# Patient Record
Sex: Female | Born: 1945
Health system: Southern US, Community
[De-identification: ages and names within clinical notes are randomized; demographics above are authoritative.]

## PROBLEM LIST (undated history)

## (undated) DIAGNOSIS — K30 Functional dyspepsia: Secondary | ICD-10-CM

## (undated) DIAGNOSIS — F329 Major depressive disorder, single episode, unspecified: Secondary | ICD-10-CM

## (undated) DIAGNOSIS — R0989 Other specified symptoms and signs involving the circulatory and respiratory systems: Secondary | ICD-10-CM

## (undated) DIAGNOSIS — M810 Age-related osteoporosis without current pathological fracture: Secondary | ICD-10-CM

## (undated) DIAGNOSIS — R6889 Other general symptoms and signs: Secondary | ICD-10-CM

## (undated) DIAGNOSIS — T4145XA Adverse effect of unspecified anesthetic, initial encounter: Secondary | ICD-10-CM

## (undated) DIAGNOSIS — E559 Vitamin D deficiency, unspecified: Secondary | ICD-10-CM

## (undated) DIAGNOSIS — F32A Depression, unspecified: Secondary | ICD-10-CM

## (undated) DIAGNOSIS — N2889 Other specified disorders of kidney and ureter: Secondary | ICD-10-CM

## (undated) DIAGNOSIS — M1990 Unspecified osteoarthritis, unspecified site: Secondary | ICD-10-CM

## (undated) DIAGNOSIS — R32 Unspecified urinary incontinence: Secondary | ICD-10-CM

## (undated) DIAGNOSIS — I10 Essential (primary) hypertension: Secondary | ICD-10-CM

## (undated) DIAGNOSIS — K635 Polyp of colon: Secondary | ICD-10-CM

## (undated) DIAGNOSIS — G629 Polyneuropathy, unspecified: Secondary | ICD-10-CM

## (undated) DIAGNOSIS — F419 Anxiety disorder, unspecified: Secondary | ICD-10-CM

## (undated) DIAGNOSIS — K5790 Diverticulosis of intestine, part unspecified, without perforation or abscess without bleeding: Secondary | ICD-10-CM

## (undated) DIAGNOSIS — K449 Diaphragmatic hernia without obstruction or gangrene: Secondary | ICD-10-CM

## (undated) DIAGNOSIS — T8859XA Other complications of anesthesia, initial encounter: Secondary | ICD-10-CM

## (undated) DIAGNOSIS — I700 Atherosclerosis of aorta: Secondary | ICD-10-CM

## (undated) HISTORY — DX: Vitamin D deficiency, unspecified: E55.9

## (undated) HISTORY — DX: Other general symptoms and signs: R68.89

## (undated) HISTORY — DX: Major depressive disorder, single episode, unspecified: F32.9

## (undated) HISTORY — DX: Depression, unspecified: F32.A

## (undated) HISTORY — DX: Unspecified osteoarthritis, unspecified site: M19.90

## (undated) HISTORY — DX: Unspecified urinary incontinence: R32

## (undated) HISTORY — PX: COLONOSCOPY: SHX174

## (undated) HISTORY — DX: Age-related osteoporosis without current pathological fracture: M81.0

## (undated) HISTORY — DX: Atherosclerosis of aorta: I70.0

## (undated) HISTORY — PX: FACIAL COSMETIC SURGERY: SHX629

## (undated) HISTORY — DX: Functional dyspepsia: K30

## (undated) HISTORY — DX: Diverticulosis of intestine, part unspecified, without perforation or abscess without bleeding: K57.90

## (undated) HISTORY — DX: Polyp of colon: K63.5

## (undated) HISTORY — PX: FINGER TENDON REPAIR: SHX1640

## (undated) HISTORY — DX: Polyneuropathy, unspecified: G62.9

## (undated) HISTORY — DX: Other specified disorders of kidney and ureter: N28.89

## (undated) HISTORY — DX: Diaphragmatic hernia without obstruction or gangrene: K44.9

## (undated) HISTORY — DX: Other specified symptoms and signs involving the circulatory and respiratory systems: R09.89

## (undated) HISTORY — DX: Other complications of anesthesia, initial encounter: T88.59XA

## (undated) HISTORY — DX: Anxiety disorder, unspecified: F41.9

---
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
## 2011-08-22 ENCOUNTER — Other Ambulatory Visit: Payer: Self-pay | Admitting: Family Medicine

## 2011-08-22 DIAGNOSIS — M542 Cervicalgia: Secondary | ICD-10-CM

## 2011-08-23 ENCOUNTER — Other Ambulatory Visit: Payer: Self-pay

## 2011-08-26 ENCOUNTER — Other Ambulatory Visit: Payer: Self-pay

## 2011-08-28 ENCOUNTER — Ambulatory Visit
Admission: RE | Admit: 2011-08-28 | Discharge: 2011-08-28 | Disposition: A | Discharge status: Home / Self Care | Payer: Medicare Other | Source: Ambulatory Visit | Attending: Family Medicine | Admitting: Family Medicine

## 2011-08-28 DIAGNOSIS — M542 Cervicalgia: Secondary | ICD-10-CM

## 2011-08-29 ENCOUNTER — Other Ambulatory Visit: Payer: Self-pay

## 2011-09-01 ENCOUNTER — Other Ambulatory Visit: Payer: Self-pay

## 2011-10-08 ENCOUNTER — Ambulatory Visit: Payer: Medicare Other | Attending: Neurosurgery | Admitting: Physical Therapy

## 2011-10-08 DIAGNOSIS — R5381 Other malaise: Secondary | ICD-10-CM | POA: Insufficient documentation

## 2011-10-08 DIAGNOSIS — M2560 Stiffness of unspecified joint, not elsewhere classified: Secondary | ICD-10-CM | POA: Insufficient documentation

## 2011-10-08 DIAGNOSIS — IMO0001 Reserved for inherently not codable concepts without codable children: Secondary | ICD-10-CM | POA: Insufficient documentation

## 2011-10-08 DIAGNOSIS — R293 Abnormal posture: Secondary | ICD-10-CM | POA: Insufficient documentation

## 2011-10-08 DIAGNOSIS — M542 Cervicalgia: Secondary | ICD-10-CM | POA: Insufficient documentation

## 2011-10-10 ENCOUNTER — Ambulatory Visit: Payer: Medicare Other | Admitting: Physical Therapy

## 2011-10-16 ENCOUNTER — Encounter: Payer: Medicare Other | Admitting: Physical Therapy

## 2011-10-30 ENCOUNTER — Other Ambulatory Visit: Payer: Self-pay | Admitting: Family Medicine

## 2011-10-30 DIAGNOSIS — Z1231 Encounter for screening mammogram for malignant neoplasm of breast: Secondary | ICD-10-CM

## 2011-11-08 ENCOUNTER — Ambulatory Visit
Admission: RE | Admit: 2011-11-08 | Discharge: 2011-11-08 | Disposition: A | Discharge status: Home / Self Care | Payer: Medicare Other | Source: Ambulatory Visit | Attending: Family Medicine | Admitting: Family Medicine

## 2011-11-08 DIAGNOSIS — Z1231 Encounter for screening mammogram for malignant neoplasm of breast: Secondary | ICD-10-CM

## 2011-11-12 ENCOUNTER — Ambulatory Visit: Payer: Medicare Other

## 2011-11-13 ENCOUNTER — Ambulatory Visit: Payer: Medicare Other

## 2011-12-21 ENCOUNTER — Encounter (HOSPITAL_BASED_OUTPATIENT_CLINIC_OR_DEPARTMENT_OTHER): Payer: Self-pay | Admitting: *Deleted

## 2011-12-21 NOTE — Progress Notes (Signed)
To come in for ekg and bmet 

## 2011-12-24 ENCOUNTER — Encounter (HOSPITAL_BASED_OUTPATIENT_CLINIC_OR_DEPARTMENT_OTHER)
Admission: RE | Admit: 2011-12-24 | Discharge: 2011-12-24 | Disposition: A | Discharge status: Home / Self Care | Payer: Medicare Other | Source: Ambulatory Visit | Attending: Orthopedic Surgery | Admitting: Orthopedic Surgery

## 2011-12-24 LAB — BASIC METABOLIC PANEL
BUN: 17 mg/dL (ref 6–23)
Calcium: 9.9 mg/dL (ref 8.4–10.5)
Chloride: 98 mEq/L (ref 96–112)
Creatinine, Ser: 0.72 mg/dL (ref 0.50–1.10)
GFR calc Af Amer: >90 mL/min (ref >90)
GFR calc non Af Amer: 88 mL/min — ABNORMAL LOW (ref >90)

## 2011-12-27 ENCOUNTER — Encounter (HOSPITAL_BASED_OUTPATIENT_CLINIC_OR_DEPARTMENT_OTHER): Payer: Self-pay | Admitting: Anesthesiology

## 2011-12-27 ENCOUNTER — Encounter (HOSPITAL_BASED_OUTPATIENT_CLINIC_OR_DEPARTMENT_OTHER): Payer: Self-pay | Admitting: *Deleted

## 2011-12-27 ENCOUNTER — Ambulatory Visit (HOSPITAL_BASED_OUTPATIENT_CLINIC_OR_DEPARTMENT_OTHER): Payer: Medicare Other | Admitting: Anesthesiology

## 2011-12-27 ENCOUNTER — Encounter (HOSPITAL_BASED_OUTPATIENT_CLINIC_OR_DEPARTMENT_OTHER)
Admission: RE | Disposition: A | Discharge status: Home / Self Care | Payer: Self-pay | Source: Ambulatory Visit | Attending: Orthopedic Surgery

## 2011-12-27 ENCOUNTER — Ambulatory Visit (HOSPITAL_BASED_OUTPATIENT_CLINIC_OR_DEPARTMENT_OTHER)
Admission: RE | Admit: 2011-12-27 | Discharge: 2011-12-28 | Disposition: A | Discharge status: Home / Self Care | Payer: Medicare Other | Source: Ambulatory Visit | Attending: Orthopedic Surgery | Admitting: Orthopedic Surgery

## 2011-12-27 DIAGNOSIS — M2022 Hallux rigidus, left foot: Secondary | ICD-10-CM

## 2011-12-27 DIAGNOSIS — M2020 Hallux rigidus, unspecified foot: Secondary | ICD-10-CM | POA: Insufficient documentation

## 2011-12-27 DIAGNOSIS — M129 Arthropathy, unspecified: Secondary | ICD-10-CM | POA: Insufficient documentation

## 2011-12-27 DIAGNOSIS — M205X9 Other deformities of toe(s) (acquired), unspecified foot: Secondary | ICD-10-CM | POA: Insufficient documentation

## 2011-12-27 DIAGNOSIS — I10 Essential (primary) hypertension: Secondary | ICD-10-CM | POA: Insufficient documentation

## 2011-12-27 DIAGNOSIS — Z01812 Encounter for preprocedural laboratory examination: Secondary | ICD-10-CM | POA: Insufficient documentation

## 2011-12-27 DIAGNOSIS — M2040 Other hammer toe(s) (acquired), unspecified foot: Secondary | ICD-10-CM | POA: Insufficient documentation

## 2011-12-27 HISTORY — DX: Unspecified osteoarthritis, unspecified site: M19.90

## 2011-12-27 HISTORY — PX: HAMMER TOE SURGERY: SHX385

## 2011-12-27 HISTORY — DX: Essential (primary) hypertension: I10

## 2011-12-27 SURGERY — ARTHRODESIS FOOT WITH WEIL OSTEOTOMY
Anesthesia: General | Site: Toe | Laterality: Left | Wound class: Clean

## 2011-12-27 MED ORDER — METOCLOPRAMIDE HCL 5 MG PO TABS: 5.0000 mg | ORAL_TABLET | Freq: Three times a day (TID) | ORAL | Status: DC | PRN

## 2011-12-27 MED ORDER — MIDAZOLAM HCL 2 MG/2ML IJ SOLN
0.5000 mg | INTRAMUSCULAR | Status: DC | PRN
  Administered 2011-12-27: 2 mg via INTRAVENOUS

## 2011-12-27 MED ORDER — ONDANSETRON HCL 4 MG/2ML IJ SOLN
INTRAMUSCULAR | Status: DC | PRN
  Administered 2011-12-27: 4 mg via INTRAVENOUS

## 2011-12-27 MED ORDER — LIDOCAINE HCL 1 % IJ SOLN
INTRAMUSCULAR | Status: DC | PRN
  Administered 2011-12-27: 2 mL via INTRADERMAL

## 2011-12-27 MED ORDER — CHLORHEXIDINE GLUCONATE 4 % EX LIQD: 60.0000 mL | Freq: Once | CUTANEOUS | Status: DC

## 2011-12-27 MED ORDER — DEXTROSE 5 % IV SOLN: 500.0000 mg | Freq: Four times a day (QID) | INTRAVENOUS | Status: DC | PRN

## 2011-12-27 MED ORDER — ONDANSETRON HCL 4 MG PO TABS
4.0000 mg | ORAL_TABLET | Freq: Four times a day (QID) | ORAL | Status: DC | PRN
  Administered 2011-12-28: 4 mg via ORAL

## 2011-12-27 MED ORDER — TERBINAFINE HCL 250 MG PO TABS: 250.0000 mg | ORAL_TABLET | Freq: Every day | ORAL | Status: DC

## 2011-12-27 MED ORDER — LIDOCAINE HCL (CARDIAC) 20 MG/ML IV SOLN
INTRAVENOUS | Status: DC | PRN
  Administered 2011-12-27: 40 mg via INTRAVENOUS

## 2011-12-27 MED ORDER — ROPIVACAINE HCL 5 MG/ML IJ SOLN
INTRAMUSCULAR | Status: DC | PRN
  Administered 2011-12-27: 20 mL via EPIDURAL

## 2011-12-27 MED ORDER — METOCLOPRAMIDE HCL 5 MG/ML IJ SOLN
INTRAMUSCULAR | Status: DC | PRN
  Administered 2011-12-27: 10 mg via INTRAVENOUS

## 2011-12-27 MED ORDER — DOCUSATE SODIUM 100 MG PO CAPS
100.0000 mg | ORAL_CAPSULE | Freq: Two times a day (BID) | ORAL | Status: DC
  Administered 2011-12-27 – 2011-12-28 (×2): 100 mg via ORAL

## 2011-12-27 MED ORDER — HYDROMORPHONE HCL 2 MG PO TABS
2.0000 mg | ORAL_TABLET | ORAL | Status: DC | PRN
  Administered 2011-12-27 – 2011-12-28 (×3): 4 mg via ORAL

## 2011-12-27 MED ORDER — SENNA 8.6 MG PO TABS
1.0000 | ORAL_TABLET | Freq: Two times a day (BID) | ORAL | Status: DC
  Administered 2011-12-27 – 2011-12-28 (×2): 8.6 mg via ORAL

## 2011-12-27 MED ORDER — MORPHINE SULFATE 2 MG/ML IJ SOLN
1.0000 mg | INTRAMUSCULAR | Status: DC | PRN
  Administered 2011-12-27: 1 mg via INTRAVENOUS

## 2011-12-27 MED ORDER — METOCLOPRAMIDE HCL 5 MG/ML IJ SOLN: 10.0000 mg | Freq: Once | INTRAMUSCULAR | Status: AC | PRN

## 2011-12-27 MED ORDER — LOSARTAN POTASSIUM 50 MG PO TABS: 50.0000 mg | ORAL_TABLET | Freq: Every day | ORAL | Status: DC

## 2011-12-27 MED ORDER — ASPIRIN 81 MG PO TABS: 81.0000 mg | ORAL_TABLET | Freq: Every day | ORAL | Status: DC

## 2011-12-27 MED ORDER — METOCLOPRAMIDE HCL 5 MG/ML IJ SOLN
5.0000 mg | Freq: Three times a day (TID) | INTRAMUSCULAR | Status: DC | PRN
  Administered 2011-12-27: 10 mg via INTRAVENOUS

## 2011-12-27 MED ORDER — OXYCODONE HCL 5 MG PO TABS: 5.0000 mg | ORAL_TABLET | Freq: Once | ORAL | Status: AC | PRN

## 2011-12-27 MED ORDER — SODIUM CHLORIDE 0.9 % IV SOLN
INTRAVENOUS | Status: DC
  Administered 2011-12-27: 12:00:00 via INTRAVENOUS

## 2011-12-27 MED ORDER — OXYCODONE HCL 5 MG PO TABS
5.0000 mg | ORAL_TABLET | ORAL | Status: DC | PRN
  Administered 2011-12-27: 10 mg via ORAL

## 2011-12-27 MED ORDER — DEXAMETHASONE SODIUM PHOSPHATE 10 MG/ML IJ SOLN
INTRAMUSCULAR | Status: DC | PRN
  Administered 2011-12-27: 4 mg via INTRAVENOUS

## 2011-12-27 MED ORDER — SODIUM CHLORIDE 0.9 % IV SOLN: INTRAVENOUS | Status: DC

## 2011-12-27 MED ORDER — FENTANYL CITRATE 0.05 MG/ML IJ SOLN: 25.0000 ug | INTRAMUSCULAR | Status: DC | PRN

## 2011-12-27 MED ORDER — FENTANYL CITRATE 0.05 MG/ML IJ SOLN
INTRAMUSCULAR | Status: DC | PRN
  Administered 2011-12-27 (×3): 25 ug via INTRAVENOUS

## 2011-12-27 MED ORDER — DIPHENHYDRAMINE HCL 12.5 MG/5ML PO ELIX: 12.5000 mg | ORAL_SOLUTION | ORAL | Status: DC | PRN

## 2011-12-27 MED ORDER — LACTATED RINGERS IV SOLN
INTRAVENOUS | Status: DC
  Administered 2011-12-27 (×3): via INTRAVENOUS

## 2011-12-27 MED ORDER — LIDOCAINE-EPINEPHRINE 1.5-1:200000 % IJ SOLN
INTRAMUSCULAR | Status: DC | PRN
  Administered 2011-12-27: 20 mL via INTRADERMAL

## 2011-12-27 MED ORDER — ONDANSETRON HCL 4 MG/2ML IJ SOLN: 4.0000 mg | Freq: Four times a day (QID) | INTRAMUSCULAR | Status: DC | PRN

## 2011-12-27 MED ORDER — FENTANYL CITRATE 0.05 MG/ML IJ SOLN
50.0000 ug | INTRAMUSCULAR | Status: DC | PRN
  Administered 2011-12-27: 100 ug via INTRAVENOUS

## 2011-12-27 MED ORDER — EPHEDRINE SULFATE 50 MG/ML IJ SOLN
INTRAMUSCULAR | Status: DC | PRN
  Administered 2011-12-27 (×2): 5 mg via INTRAVENOUS

## 2011-12-27 MED ORDER — METHOCARBAMOL 500 MG PO TABS
500.0000 mg | ORAL_TABLET | Freq: Four times a day (QID) | ORAL | Status: DC | PRN
  Administered 2011-12-27: 500 mg via ORAL

## 2011-12-27 MED ORDER — PROPOFOL 10 MG/ML IV EMUL
INTRAVENOUS | Status: DC | PRN
  Administered 2011-12-27: 150 mg via INTRAVENOUS

## 2011-12-27 MED ORDER — CEFAZOLIN SODIUM-DEXTROSE 2-3 GM-% IV SOLR
2.0000 g | INTRAVENOUS | Status: AC
Start: 2011-12-27 — End: 2011-12-27
  Administered 2011-12-27: 2 g via INTRAVENOUS

## 2011-12-27 MED ORDER — OXYCODONE-ACETAMINOPHEN 5-325 MG PO TABS: 1.0000 | ORAL_TABLET | ORAL | Status: AC | PRN

## 2011-12-27 MED ORDER — HYDROMORPHONE HCL PF 1 MG/ML IJ SOLN: 0.5000 mg | INTRAMUSCULAR | Status: DC | PRN

## 2011-12-27 SURGICAL SUPPLY — 82 items
BANDAGE CONFORM 2  STR LF (GAUZE/BANDAGES/DRESSINGS) IMPLANT
BANDAGE CONFORM 3  STR LF (GAUZE/BANDAGES/DRESSINGS) ×3 IMPLANT
BANDAGE ESMARK 6X9 LF (GAUZE/BANDAGES/DRESSINGS) ×2 IMPLANT
BANDAGE GAUZE 4  KLING STR (GAUZE/BANDAGES/DRESSINGS) IMPLANT
BANDAGE GAUZE ELAST BULKY 4 IN (GAUZE/BANDAGES/DRESSINGS) IMPLANT
BIT DRILL 1.5X30 QC DISP (BIT) ×3 IMPLANT
BIT DRILL 2.0 (BIT) ×1
BIT DRILL 2XNS DISP SS SM FRAG (BIT) ×2 IMPLANT
BIT DRL 2XNS DISP SS SM FRAG (BIT) ×2
BLADE AVERAGE 25X9 (BLADE) ×3 IMPLANT
BLADE OSC/SAG .038X5.5 CUT EDG (BLADE) ×3 IMPLANT
BLADE SURG 15 STRL LF DISP TIS (BLADE) ×8 IMPLANT
BLADE SURG 15 STRL SS (BLADE) ×4
BNDG COHESIVE 4X5 TAN STRL (GAUZE/BANDAGES/DRESSINGS) ×3 IMPLANT
BNDG ESMARK 4X9 LF (GAUZE/BANDAGES/DRESSINGS) IMPLANT
BNDG ESMARK 6X9 LF (GAUZE/BANDAGES/DRESSINGS) ×3
CAP PIN ORTHO PINK (CAP) ×3 IMPLANT
CAP PIN PROTECTOR ORTHO WHT (CAP) IMPLANT
CHLORAPREP W/TINT 26ML (MISCELLANEOUS) ×3 IMPLANT
CLOTH BEACON ORANGE TIMEOUT ST (SAFETY) ×3 IMPLANT
COVER TABLE BACK 60X90 (DRAPES) ×3 IMPLANT
CUFF TOURNIQUET SINGLE 18IN (TOURNIQUET CUFF) IMPLANT
DRAPE EXTREMITY T 121X128X90 (DRAPE) ×3 IMPLANT
DRAPE OEC MINIVIEW 54X84 (DRAPES) ×3 IMPLANT
DRAPE SURG 17X23 STRL (DRAPES) IMPLANT
DRAPE U-SHAPE 47X51 STRL (DRAPES) ×3 IMPLANT
DRSG EMULSION OIL 3X3 NADH (GAUZE/BANDAGES/DRESSINGS) ×3 IMPLANT
DRSG PAD ABDOMINAL 8X10 ST (GAUZE/BANDAGES/DRESSINGS) IMPLANT
ELECT REM PT RETURN 9FT ADLT (ELECTROSURGICAL) ×3
ELECTRODE REM PT RTRN 9FT ADLT (ELECTROSURGICAL) ×2 IMPLANT
GAUZE SPONGE 4X4 16PLY XRAY LF (GAUZE/BANDAGES/DRESSINGS) IMPLANT
GLOVE BIO SURGEON STRL SZ8 (GLOVE) ×3 IMPLANT
GLOVE BIOGEL M STRL SZ7.5 (GLOVE) ×3 IMPLANT
GLOVE BIOGEL PI IND STRL 8 (GLOVE) ×4 IMPLANT
GLOVE BIOGEL PI INDICATOR 8 (GLOVE) ×2
GLOVE ECLIPSE 6.5 STRL STRAW (GLOVE) ×3 IMPLANT
GOWN PREVENTION PLUS XLARGE (GOWN DISPOSABLE) ×3 IMPLANT
GOWN PREVENTION PLUS XXLARGE (GOWN DISPOSABLE) ×6 IMPLANT
IMPLANT SMART TOE 15MM (Toe) ×3 IMPLANT
K-WIRE 102X1.4 (WIRE) IMPLANT
K-WIRE ACE 1.6X6 (WIRE) ×9
KWIRE 4.0 X .045IN (WIRE) IMPLANT
KWIRE ACE 1.6X6 (WIRE) ×6 IMPLANT
NEEDLE HYPO 22GX1.5 SAFETY (NEEDLE) IMPLANT
NEEDLE HYPO 25X1 1.5 SAFETY (NEEDLE) IMPLANT
NS IRRIG 1000ML POUR BTL (IV SOLUTION) ×3 IMPLANT
PACK BASIN DAY SURGERY FS (CUSTOM PROCEDURE TRAY) ×3 IMPLANT
PAD CAST 4YDX4 CTTN HI CHSV (CAST SUPPLIES) ×2 IMPLANT
PADDING CAST ABS 4INX4YD NS (CAST SUPPLIES) ×1
PADDING CAST ABS COTTON 4X4 ST (CAST SUPPLIES) ×2 IMPLANT
PADDING CAST COTTON 4X4 STRL (CAST SUPPLIES) ×1
PENCIL BUTTON HOLSTER BLD 10FT (ELECTRODE) ×3 IMPLANT
PLATE SM 1/4 TUBULAR 5H (Plate) ×3 IMPLANT
SCREW CORTICAL 2.0X12 (Screw) ×3 IMPLANT
SCREW CORTICAL 2.7MM  14MM (Screw) ×1 IMPLANT
SCREW CORTICAL 2.7MM  18MM (Screw) ×2 IMPLANT
SCREW CORTICAL 2.7MM  20MM (Screw) ×1 IMPLANT
SCREW CORTICAL 2.7MM 14MM (Screw) ×2 IMPLANT
SCREW CORTICAL 2.7MM 18MM (Screw) ×4 IMPLANT
SCREW CORTICAL 2.7MM 20MM (Screw) ×2 IMPLANT
SCREW LAG  RD HEAD 4.0 38 LTH (Screw) ×1 IMPLANT
SCREW LAG  RD HEAD 4.0 42 LTH (Screw) ×1 IMPLANT
SCREW LAG RD HEAD 4.0 38 LTH (Screw) ×2 IMPLANT
SCREW LAG RD HEAD 4.0 42 LTH (Screw) ×2 IMPLANT
SHEET MEDIUM DRAPE 40X70 STRL (DRAPES) ×3 IMPLANT
SPONGE GAUZE 4X4 12PLY (GAUZE/BANDAGES/DRESSINGS) ×3 IMPLANT
SPONGE LAP 18X18 X RAY DECT (DISPOSABLE) ×3 IMPLANT
STOCKINETTE 6  STRL (DRAPES) ×1
STOCKINETTE 6 STRL (DRAPES) ×2 IMPLANT
STRIP CLOSURE SKIN 1/2X4 (GAUZE/BANDAGES/DRESSINGS) IMPLANT
SUCTION FRAZIER TIP 10 FR DISP (SUCTIONS) IMPLANT
SUT ETHILON 4 0 PS 2 18 (SUTURE) ×3 IMPLANT
SUT MNCRL AB 4-0 PS2 18 (SUTURE) IMPLANT
SUT PROLENE 3 0 PS 2 (SUTURE) ×6 IMPLANT
SUT VICRYL 4-0 PS2 18IN ABS (SUTURE) IMPLANT
SYR BULB 3OZ (MISCELLANEOUS) ×3 IMPLANT
SYR CONTROL 10ML LL (SYRINGE) IMPLANT
TOWEL OR 17X24 6PK STRL BLUE (TOWEL DISPOSABLE) ×3 IMPLANT
TUBE CONNECTING 20X1/4 (TUBING) IMPLANT
UNDERPAD 30X30 INCONTINENT (UNDERPADS AND DIAPERS) ×3 IMPLANT
WATER STERILE IRR 1000ML POUR (IV SOLUTION) IMPLANT
YANKAUER SUCT BULB TIP NO VENT (SUCTIONS) IMPLANT

## 2011-12-27 NOTE — Discharge Instructions (Addendum)
Lynn Arthurs, MD [facility]  Please read the following information regarding your care after surgery.  Medications  You only need a prescription for the narcotic pain medicine (ex. oxycodone, Percocet, Norco).  All of the other medicines listed below are available over the counter. X acetominophen (Tylenol) 650 mg every 4-6 hours as you need for minor pain X oxycodone as prescribed for moderate to severe pain ?   Narcotic pain medicine (ex. oxycodone, Percocet, Vicodin) will cause constipation.  To prevent this problem, take the following medicines while you are taking any pain medicine. X docusate sodium (Colace) 100 mg twice a day X senna (Senokot) 2 tablets twice a day  X To help prevent blood clots, take an aspirin (81 mg) once a day for a month after surgery.  You should also get up every hour while you are awake to move around.    Weight Bearing ? Bear weight when you are able on your operated leg or foot. X Bear weight only on the heel of your operated foot in the post-op shoe. ? Do not bear any weight on the operated leg or foot.  Cast / Splint / Dressing X Keep your splint or cast clean and dry.  Don't put anything (coat hanger, pencil, etc) down inside of it.  If it gets damp, use a hair dryer on the cool setting to dry it.  If it gets soaked, call the office to schedule an appointment for a cast change. ? Remove your dressing 3 days after surgery and cover the incisions with dry dressings.    After your dressing, cast or splint is removed; you may shower, but do not soak or scrub the wound.  Allow the water to run over it, and then gently pat it dry.  Swelling It is normal for you to have swelling where you had surgery.  To reduce swelling and pain, keep your toes above your nose for at least 3 days after surgery.  It may be necessary to keep your foot or leg elevated for several weeks.  If it hurts, it should be elevated.  Follow Up Call my office at  [phone] when you are discharged from the hospital or surgery center to schedule an appointment to be seen two weeks after surgery.  Call my office at [phone] if you develop a fever >101.5 F, nausea, vomiting, bleeding from the surgical site or severe pain.

## 2011-12-27 NOTE — Transfer of Care (Signed)
Immediate Anesthesia Transfer of Care Note  Patient: Lynn [name]  Procedure(s) Performed: Procedure(s) (LRB): ARTHRODESIS FOOT WITH WEIL OSTEOTOMY (Left) HAMMER TOE CORRECTION (Left)  Patient Location: PACU  Anesthesia Type: GA combined with regional for post-op pain  Level of Consciousness: awake  Airway & Oxygen Therapy: Patient Spontanous Breathing and Patient connected to face mask oxygen  Post-op Assessment: Report given to PACU RN, Post -op Vital signs reviewed and stable and Patient moving all extremities  Post vital signs: Reviewed and stable  Complications: No apparent anesthesia complications

## 2011-12-27 NOTE — Brief Op Note (Signed)
12/27/2011  10:30 AM  PATIENT:  Durwin Nora Keeran  66 y.o. female  PRE-OPERATIVE DIAGNOSIS:  left hallux rigidus, 2nd claw toe, 3rd, and 4th hammer toes  POST-OPERATIVE DIAGNOSIS: left hallux rigidus, 2nd claw toe, 3rd, and 4th hammer toes  Procedure(s): 1.  Left hallux MTPJ arthrodesis 2.  Left 2nd MT head resection 3.  Left 3rd MT weil osteotomy 4.  Left 2nd hammertoe correction (PIP arthrodesis) 5.  Left 2nd flexor to extensor transfer 6.  Left 2nd EDB to EDL transfer 7.  Left 2nd MTPJ dorsal capsultomy 8.  Left 3rd toe angular correction  9. Left 3rd MTPJ dorsal capsulotomy 10.  Left 3rd EDB to EDL transfer 11.  Left 3rd hammertoe correction 12. Left 4th DIP arthrodesis 13. Fluoro > 1 hour  SURGEON:  Toni Arthurs, MD  ASSISTANT: n/a  ANESTHESIA:   General, regional  EBL:  minimal   TOURNIQUET:   Total Tourniquet Time Documented: Thigh (Left) - 138 minutes  COMPLICATIONS:  None apparent  DISPOSITION:  Extubated, awake and stable to recovery.  DICTATION ID:  [id number]

## 2011-12-27 NOTE — Anesthesia Preprocedure Evaluation (Addendum)
Anesthesia Evaluation  Patient identified by MRN, date of birth, ID band Patient awake    Reviewed: Allergy & Precautions, H&P , NPO status , Patient's Chart, lab work & pertinent test results, reviewed documented beta blocker date and time   Airway Mallampati: II TM Distance: >3 FB Neck ROM: full    Dental   Pulmonary neg pulmonary ROS,          Cardiovascular hypertension, On Medications     Neuro/Psych negative neurological ROS  negative psych ROS   GI/Hepatic negative GI ROS, Neg liver ROS,   Endo/Other  negative endocrine ROS  Renal/GU negative Renal ROS  negative genitourinary   Musculoskeletal   Abdominal   Peds  Hematology negative hematology ROS (+)   Anesthesia Other Findings See surgeon's H&P   Reproductive/Obstetrics negative OB ROS                           Anesthesia Physical Anesthesia Plan  ASA: II  Anesthesia Plan: General   Post-op Pain Management:    Induction: Intravenous  Airway Management Planned: LMA  Additional Equipment:   Intra-op Plan:   Post-operative Plan: Extubation in OR  Informed Consent: I have reviewed the patients History and Physical, chart, labs and discussed the procedure including the risks, benefits and alternatives for the proposed anesthesia with the patient or authorized representative who has indicated his/her understanding and acceptance.   Dental Advisory Given  Plan Discussed with: CRNA and Surgeon  Anesthesia Plan Comments:         Anesthesia Quick Evaluation  

## 2011-12-27 NOTE — H&P (Signed)
Lynn [name] is an 66 y.o. female.   Chief Complaint: left forefoot painful deformities HPI: 66 y/o female without significant PMH presents now for surgical correction of painful left forefoot deformities including left hallux rigidus, 2nd clawtoe deformity and 3rd and 4th hammertoes.    Past Medical History  Diagnosis Date  . Hypertension   . Arthritis     Past Surgical History  Procedure Date  . Finger tendon repair     middle rt hand-  . Colonoscopy   . Facial cosmetic surgery     No family history on file. Social History:  reports that she quit smoking about 36 years ago. She does not have any smokeless tobacco history on file. She reports that she drinks alcohol. She reports that she does not use illicit drugs.  Allergies: No Known Allergies  Medications Prior to Admission  Medication Sig Dispense Refill  . aspirin 81 MG tablet Take 81 mg by mouth daily.      . diclofenac (VOLTAREN) 75 MG EC tablet Take 75 mg by mouth 2 (two) times daily.      Marland Kitchen losartan (COZAAR) 50 MG tablet Take 50 mg by mouth daily.      Marland Kitchen terbinafine (LAMISIL) 250 MG tablet Take 250 mg by mouth daily. Taking for 6 weeks-nail fungs        No results found for this or any previous visit (from the past 48 hour(s)). No results found.  ROS  No recent f/c/n/v/wt loss  Blood pressure 144/86, pulse 73, temperature 98.2 F (36.8 C), temperature source Oral, resp. rate 16, height 5\' 2"  (1.575 m), weight 61.236 kg (135 lb), SpO2 99.00%. Physical Exam wn wd woman in nad.  A and O x 4.  Mood and affect normal.  EOMI.  Respirations unlabored.  L foot with hallux rigidus, 2nd claw toe and 3rd and 4th hammertoe deformities.  Healthy skin.  Palpable pulses.  Feels LT throughout.  5/5 strength in PF and DF of ankle.  No lymphadenopathy.  Assessment/Plan L hallux rigidus, 2nd clawtoe and 3rd and 4th hammertoe deformities.  To OR for surgical correction. The risks and benefits of the alternative treatment  options have been discussed in detail.  The patient wishes to proceed with surgery and specifically understands risks of bleeding, infection, nerve damage, blood clots, need for additional surgery, amputation and death.   Toni Arthurs 2012-01-10, 7:22 AM

## 2011-12-27 NOTE — Op Note (Signed)
Lynn [name], Lynn [name]          ACCOUNT NO.:  [id number]  MEDICAL RECORD NO.:  [id number]  LOCATION:                                 FACILITY:  PHYSICIAN:  Toni Arthurs, MD        DATE OF BIRTH:  [birth year]  DATE OF PROCEDURE:  12/27/2011 DATE OF DISCHARGE:                              OPERATIVE REPORT   PREOPERATIVE DIAGNOSES: 1. Left hallux rigidus. 2. Left second claw toe deformity. 3. Left third hammertoe with angular deformity at the proximal     interphalangeal joint. 4. Left fourth hammertoe with angular deformity at the proximal     interphalangeal joint.  POSTOPERATIVE DIAGNOSES: 1. Left hallux rigidus. 2. Left second claw toe deformity. 3. Left third hammertoe with angular deformity at the proximal     interphalangeal joint. 4. Left fourth hammertoe with angular deformity at the proximal     interphalangeal joint.  PROCEDURES: 1. Left hallux MP joint arthrodesis. 2. Left second metatarsal head resection. 3. Left third metatarsal Weil osteotomy. 4. Left second hammertoe correction (PIP arthrodesis). 5. Left second toe flexor to extensor transfer. 6. Left second extensor digitorum brevis to extensor digitorum longus     transfer. 7. Left second MTP joint dorsal capsulotomy with extensor tendon     lengthening. 8. Left third toe angular correction at the MTP joint. 9. Left third MTP joint dorsal capsulotomy. 10.Left third extensor digitorum brevis to extensor digitorum longus     transfer. 11.Left third hammertoe correction (PIP arthrodesis). 12.Left fourth DIP joint arthrodesis. 13.Intraoperative interpretation of fluoroscopic imaging greater than     1 hour.  SURGEON:  Toni Arthurs, MD  ANESTHESIA:  General, regional.  ESTIMATED BLOOD LOSS:  Minimal.  TOURNIQUET TIME:  2 hours and 18 minutes at 225 mmHg.  COMPLICATIONS:  None apparent.  DISPOSITION:  Extubated, awake, and stable to recovery.  INDICATIONS FOR PROCEDURE:  The patient is a 66 year old  woman who complains of left forefoot deformity and pain for many years.  She presents now for operative treatment of her hallux rigidus, second claw toe, and third and fourth hammertoes with angular corrections at the MP and PIP joint at the third toe and DIP joint at the fourth toe.  She understands the risks, benefits and alternative treatment options and elects surgical treatment.  She specifically understands the risks of bleeding, infection, nerve damage, blood clots, need for additional surgery, amputation, and death.  PROCEDURE IN DETAIL:  After preoperative consent was obtained and the correct operative site was identified, the patient was brought to the operating room and placed supine on the operating table.  General anesthesia was induced.  Preoperative antibiotics were administered. Surgical time-out was taken.  Left lower extremity was prepped and draped in standard sterile fashion.  A dorsal incision was made over the hallux MP joint.  Sharp dissection was carried down through the skin and subcutaneous tissue.  The extensor digitorum brevis was released off at the base of the proximal phalanx.  The head of the metatarsal was exposed.  The K-wire was inserted in the center.  A concave reamer was used to remove the remaining cartilage and subchondral bone.  Peripheral osteophytes were removed with the rongeur.  A pin was placed through the base of the proximal phalanx and the convex reamer was used to remove all the remaining subchondral bone and articular cartilage.  The wound was irrigated copiously.  All overhanging bits of bone and fibrous tissue were removed allowing appropriate reduction of the joint.  A 2-mm drill bit was used to perforate the head of the metatarsal at the base of the proximal phalanx at multiple places.  The joint was reduced and pinned provisionally.  AP and lateral views showed appropriate position of the toe and a simulated weightbearing  examination with a footplate showed appropriate position of the hallux.  A cannulated 4-mm screw was then inserted across the joint from proximal medial to distal lateral compressing the joint appropriately.  A 5-hole one quarter tubular plate was selected and contoured to fit the dorsum of the joint.  It was secured distally with two bicortical 2.7-mm screws and proximally with two bicortical 2.7-mm screws.  AP and lateral views showed appropriate position and length of all hardware and appropriate reduction of the joint.  Attention was then turned to the second toe.  A longitudinal incision was made at the second web space.  The second toe was noted to be dislocated at the level of the MP joint.  The extensor digitorum longus tendon was lengthened in a Z fashion.  The extensive digitorum brevis tendon was released from its insertion at the base of the proximal phalanx.  The joint was reduced.  The metatarsal head was noted to be essentially destroyed with loose cartilage and extremely soft bone. Weil osteotomy was not possible due to the quality of the joint surface and the bone, so metatarsal head resection was carried out allowing reduction of the MP joint.  Attention was then turned to the third metatarsal head.  The dorsal capsulotomy was performed at the MP joint with lengthening of the EDL and release of the EPB tendon just as had been done at the second toe.  This allowed the third toe to reduce appropriately.  The collateral ligament on the side of the hallux was released to correct the angular deformity through the MP joint.  Weil osteotomy was performed and head was allowed to retract proximally a couple of millimeters.  It was fixed with a 2-mm screw and noted to have appropriate purchase.  The collateral ligament was then tightened with a figure-of-eight suture of 0 Vicryl.  Attention was then turned to the proximal phalanx.  A dorsal incision was made transversely  through the skin and extensor tendon.  The head of the proximal phalanx was resected with a sagittal saw.  The base of the middle phalanx was removed with a reamer.  The broach for the Smart Toe implant was used proximally and distally.  A 15-mm Smart Toe implant was implanted and held in position for approximately a minute while it warmed.  This corrected the angular deformity at the PIP joint.  Attention was then turned to the third toe.  The DIP joint was exposed with a transverse incision through the skin and extensor tendon.  The head of the middle phalanx was resected with a saw and the base of distal phalanx was removed with a reamer.  The joint was reduced and a K- wire was inserted from the tip of the toe across the DIP and PIP joints. This was noted to have appropriate purchase.  The pin was bent and trimmed.  A cap was applied.  Attention was then turned back  to the second toe.  A plantar oblique incision was made at the proximal flexion crease.  Blunt dissection was carried down through the subcutaneous tissue taking care to protect the neurovascular bundles medially and laterally.  The flexor tendon sheath was identified.  It was split longitudinally.  The flexor digitorum longus tendon was identified.  It was released from the tip of the toe percutaneously and withdrawn through the proximal incision.  It was split and tagged.  The limbs of suture were then passed up along the shaft of the proximal phalanx out through the dorsal incision.  A K-wire was then inserted from the tip of the toe across the DIP and PIP joints. This reduced the previous PIP joint arthrodesis and held it into position.  The MP joint was reduced and a guidepin was passed across the joint into the metatarsal shaft.  With the toe held in a straight and reduced position, the two flexor tendons were then repaired over the dorsum of the proximal phalanx with 3-0 Mersilene sutures.  The extensor digitorum  brevis tendon was then repaired to the extensor digitorum longus tendon transferring it and the extensor digitorum longus tendon was repaired back to its slip of the EDL on the proximal phalanx.  This was repeated for the third toe again transferring the EDB to the EDL and repairing the EPL tendon.  The second toe pin was also trimmed and capped.  The wounds were all irrigated copiously.  Horizontal mattress sutures of 3-0 Prolene were used to close all of the small incisions and the long incision dorsally was closed with inverted simple sutures of 3- 0 Monocryl and a running 3-0 Prolene.  The hallux incision had been closed with 3-0 Monocryl repairing the EPB tendon over the plate and repairing subcutaneous tissue and a running 3-0 Prolene was used to close the skin incision.  Sterile dressings were applied followed by a compression wrap.  The tourniquet was released at 2 hours and 18 minutes.  The patient was awakened from anesthesia and transported to the recovery room in stable condition.  FOLLOWUP PLAN:  The patient will be weightbearing as tolerated on her left foot in a DARCO shoe.  She will follow up with me in 2 weeks for suture removal and conversion to a cast.  She will be observed overnight for pain control.     Toni Arthurs, MD     JH/MEDQ  D:  12/27/2011  T:  12/27/2011  Job:  [id number]

## 2011-12-27 NOTE — Anesthesia Postprocedure Evaluation (Signed)
Anesthesia Post Note  Patient: Lynn [name]  Procedure(s) Performed: Procedure(s) (LRB): ARTHRODESIS FOOT WITH WEIL OSTEOTOMY (Left) HAMMER TOE CORRECTION (Left)  Anesthesia type: General  Patient location: PACU  Post pain: Pain level controlled  Post assessment: Patient's Cardiovascular Status Stable  Last Vitals:  Filed Vitals:   12/27/11 1130  BP: 151/84  Pulse: 66  Temp:   Resp: 17    Post vital signs: Reviewed and stable  Level of consciousness: alert  Complications: No apparent anesthesia complications

## 2011-12-27 NOTE — Anesthesia Procedure Notes (Addendum)
Anesthesia Regional Block:  Popliteal block  Pre-Anesthetic Checklist: ,, timeout performed, Correct Patient, Correct Site, Correct Laterality, Correct Procedure, Correct Position, site marked, Risks and benefits discussed,  Surgical consent,  Pre-op evaluation,  At surgeon's request and post-op pain management  Laterality: Left  Prep: chloraprep       Needles:   Needle Type: Other   (Arrow Echogenic)   Needle Length: 9cm  Needle Gauge: 21    Additional Needles:  Procedures: ultrasound guided Popliteal block Narrative:  Start time: 12/27/2011 6:51 AM End time: 12/27/2011 7:00 AM Injection made incrementally with aspirations every 5 mL.  Performed by: Personally  Anesthesiologist: Aldona Lento, MD  Additional Notes: Ultrasound guidance used to: id relevant anatomy, confirm needle position, local anesthetic spread, avoidance of vascular puncture. Picture saved. No complications. Block performed personally by Janetta Hora. Gelene Mink, MD  .    Popliteal block Procedure Name: LMA Insertion Date/Time: 12/27/2011 7:44 AM Performed by: Meyer Russel Pre-anesthesia Checklist: Patient identified, Emergency Drugs available, Suction available and Patient being monitored Patient Re-evaluated:Patient Re-evaluated prior to inductionOxygen Delivery Method: Circle System Utilized Preoxygenation: Pre-oxygenation with 100% oxygen Intubation Type: IV induction Ventilation: Mask ventilation without difficulty LMA: LMA inserted LMA Size: 4.0 Number of attempts: 1 Airway Equipment and Method: bite block Placement Confirmation: positive ETCO2 and breath sounds checked- equal and bilateral Tube secured with: Tape Dental Injury: Teeth and Oropharynx as per pre-operative assessment

## 2011-12-27 NOTE — Progress Notes (Signed)
Assisted Dr. Frederick with left, ultrasound guided, popliteal/saphenous block. Side rails up, monitors on throughout procedure. See vital signs in flow sheet. Tolerated Procedure well. 

## 2011-12-28 MED ORDER — DIAZEPAM 2 MG PO TABS
2.0000 mg | ORAL_TABLET | Freq: Once | ORAL | Status: AC
  Administered 2011-12-28: 2 mg via ORAL

## 2012-01-02 ENCOUNTER — Encounter (HOSPITAL_BASED_OUTPATIENT_CLINIC_OR_DEPARTMENT_OTHER): Payer: Self-pay

## 2012-01-03 ENCOUNTER — Encounter (HOSPITAL_BASED_OUTPATIENT_CLINIC_OR_DEPARTMENT_OTHER): Payer: Self-pay | Admitting: Orthopedic Surgery

## 2012-08-26 ENCOUNTER — Ambulatory Visit: Payer: Medicare Other | Attending: Family Medicine | Admitting: Physical Therapy

## 2012-08-26 DIAGNOSIS — IMO0001 Reserved for inherently not codable concepts without codable children: Secondary | ICD-10-CM | POA: Insufficient documentation

## 2012-08-26 DIAGNOSIS — R5381 Other malaise: Secondary | ICD-10-CM | POA: Insufficient documentation

## 2012-08-26 DIAGNOSIS — M6281 Muscle weakness (generalized): Secondary | ICD-10-CM | POA: Insufficient documentation

## 2012-08-28 ENCOUNTER — Ambulatory Visit: Payer: Medicare Other | Admitting: Physical Therapy

## 2012-09-03 ENCOUNTER — Ambulatory Visit: Payer: Medicare Other | Admitting: Licensed Clinical Social Worker

## 2012-09-03 ENCOUNTER — Ambulatory Visit: Payer: Medicare Other | Admitting: Physical Therapy

## 2012-09-26 DIAGNOSIS — I7300 Raynaud's syndrome without gangrene: Secondary | ICD-10-CM | POA: Insufficient documentation

## 2012-09-26 DIAGNOSIS — M47812 Spondylosis without myelopathy or radiculopathy, cervical region: Secondary | ICD-10-CM | POA: Insufficient documentation

## 2012-11-28 ENCOUNTER — Other Ambulatory Visit: Payer: Self-pay | Admitting: Family Medicine

## 2012-11-28 ENCOUNTER — Ambulatory Visit
Admission: RE | Admit: 2012-11-28 | Discharge: 2012-11-28 | Disposition: A | Discharge status: Home / Self Care | Payer: Medicare Other | Source: Ambulatory Visit | Attending: Family Medicine | Admitting: Family Medicine

## 2012-11-28 DIAGNOSIS — Z1231 Encounter for screening mammogram for malignant neoplasm of breast: Secondary | ICD-10-CM

## 2012-12-23 ENCOUNTER — Other Ambulatory Visit: Payer: Self-pay | Admitting: Nurse Practitioner

## 2012-12-29 ENCOUNTER — Ambulatory Visit: Payer: Self-pay | Admitting: Family Medicine

## 2012-12-31 ENCOUNTER — Encounter: Payer: Self-pay | Admitting: Family Medicine

## 2012-12-31 ENCOUNTER — Ambulatory Visit (INDEPENDENT_AMBULATORY_CARE_PROVIDER_SITE_OTHER): Payer: Medicare Other | Admitting: Family Medicine

## 2012-12-31 VITALS — BP 118/78 | HR 67 | Temp 97.8°F | Ht 62.25 in | Wt 129.8 lb

## 2012-12-31 DIAGNOSIS — R5381 Other malaise: Secondary | ICD-10-CM

## 2012-12-31 DIAGNOSIS — R42 Dizziness and giddiness: Secondary | ICD-10-CM

## 2012-12-31 DIAGNOSIS — E559 Vitamin D deficiency, unspecified: Secondary | ICD-10-CM

## 2012-12-31 DIAGNOSIS — E785 Hyperlipidemia, unspecified: Secondary | ICD-10-CM

## 2012-12-31 DIAGNOSIS — R5383 Other fatigue: Secondary | ICD-10-CM

## 2012-12-31 LAB — POCT CBC
HCT, POC: 41.2 % (ref 37.7–47.9)
Hemoglobin: 14.1 g/dL (ref 12.2–16.2)
MCH, POC: 32.5 pg — AB (ref 27–31.2)
MCV: 94.8 fL (ref 80–97)
MPV: 8.3 fL (ref 0–99.8)
RBC: 4.4 M/uL (ref 4.04–5.48)

## 2012-12-31 LAB — HEPATIC FUNCTION PANEL
ALT: 24 U/L (ref 0–35)
AST: 17 U/L (ref 0–37)
Alkaline Phosphatase: 81 U/L (ref 39–117)
Indirect Bilirubin: 0.4 mg/dL (ref 0.0–0.9)
Total Protein: 6.4 g/dL (ref 6.0–8.3)

## 2012-12-31 LAB — LIPID PANEL
HDL: 71 mg/dL (ref >39)
LDL Cholesterol: 120 mg/dL — ABNORMAL HIGH (ref 0–99)

## 2012-12-31 MED ORDER — DICLOFENAC SODIUM 75 MG PO TBEC: 75.0000 mg | DELAYED_RELEASE_TABLET | Freq: Two times a day (BID) | ORAL | Status: DC

## 2012-12-31 MED ORDER — LOSARTAN POTASSIUM 50 MG PO TABS: 50.0000 mg | ORAL_TABLET | Freq: Every day | ORAL | Status: DC

## 2012-12-31 NOTE — Addendum Note (Signed)
Addended by: Orma Render F on: 12/31/2012 04:39 PM   Modules accepted: Orders

## 2012-12-31 NOTE — Patient Instructions (Addendum)
Fall precautions discussed Continue current meds and therapeutic lifestyle changes Continue physical therapy as doing. Always drink plenty of fluids. Monitor blood pressures more closely especially if having dizzy spells

## 2012-12-31 NOTE — Progress Notes (Signed)
  Subjective:    Patient ID: Lynn [name], female    DOB: 16-Apr-1946, 67 y.o.   MRN: [id number]  HPI Patient comes in today for followup of chronic medical problems. She has a history of severe degenerative arthritis and neck back hips and knees. She is seeing the orthopedic people and groins were for this. As per her review of systems she is having some issues with allergies and drainage. She is taking losartan and Voltaren.   Review of Systems  Constitutional: Negative.   HENT: Positive for voice change (hoarseness), postnasal drip (slight drainage) and sinus pressure (slight).   Eyes: Negative.   Respiratory: Negative.   Cardiovascular: Negative.   Gastrointestinal: Positive for constipation (some).  Endocrine: Negative.   Genitourinary: Negative.   Musculoskeletal: Positive for back pain (cervical, LBP) and arthralgias (knees, hands, R hip).  Skin: Negative.   Allergic/Immunologic: Positive for environmental allergies (slight).  Neurological: Positive for dizziness (occasional). Negative for headaches.  Hematological: Bruises/bleeds easily.  Psychiatric/Behavioral: Positive for sleep disturbance (occasional trouble getting to sleep). The patient is nervous/anxious (some).        Objective:   Physical Exam BP 118/78  Pulse 67  Temp(Src) 97.8 F (36.6 C) (Oral)  Ht 5' 2.25" (1.581 m)  Wt 129 lb 12.8 oz (58.877 kg)  BMI 23.55 kg/m2  The patient appeared well nourished and normally developed, alert and oriented to time and place. Speech, behavior and judgement appear normal. Vital signs as documented.  Head exam is unremarkable. No scleral icterus or pallor noted. Slight nasal congestion bilaterally. Mouth and throat were normal.  Neck is without jugular venous distension, thyromegally, or carotid bruits. Carotid upstrokes are brisk bilaterally. No cervical adenopathy. Lungs are clear anteriorly and posteriorly to auscultation. Normal respiratory effort. Cardiac exam  reveals regular rate and rhythm at 72 per minute. First and second heart sounds normal.  No murmurs, rubs or gallops.  Abdominal exam reveals normal bowl sounds, no masses, no organomegaly and no aortic enlargement. No inguinal adenopathy. Extremities are nonedematous and both femoral and pedal pulses are normal. Feet were cool to touch. Some tenderness in the plantar surface of the left foot. Skin without pallor or jaundice.  Warm and dry, without rash. Neurologic exam reveals normal deep tendon reflexes and normal sensation. Right lower DTRs slightly decreased compared to left.          Assessment & Plan:  1. Hyperlipemia- Hepatic function panel; Standing - Lipid panel; Standing  2. Dizziness - POCT CBC; Standing - BASIC METABOLIC PANEL WITH GFR; Standing  3. Vitamin D deficiency - Vitamin D 25 hydroxy; Standing  4. Fatigue - BASIC METABOLIC PANEL WITH GFR; Standing   Patient Instructions  Fall precautions discussed Continue current meds and therapeutic lifestyle changes Continue physical therapy as doing. Always drink plenty of fluids. Monitor blood pressures more closely especially if having dizzy spells

## 2013-01-01 ENCOUNTER — Telehealth: Payer: Self-pay | Admitting: *Deleted

## 2013-01-01 LAB — BASIC METABOLIC PANEL WITH GFR
BUN: 24 mg/dL — ABNORMAL HIGH (ref 6–23)
Creat: 0.81 mg/dL (ref 0.50–1.10)
GFR, Est African American: 87 mL/min
Glucose, Bld: 87 mg/dL (ref 70–99)
Potassium: 5.3 mEq/L (ref 3.5–5.3)

## 2013-01-01 NOTE — Telephone Encounter (Signed)
Pt notifed of results

## 2013-01-01 NOTE — Telephone Encounter (Signed)
Message copied by Bearl Mulberry on Thu Jan 01, 2013 10:38 AM ------      Message from: Ernestina Penna      Created: Wed Dec 31, 2012  5:41 PM       The CBC had a normal white blood cell. And the hemoglobin was good at 14.1. Platelets were adequate. ------

## 2013-01-01 NOTE — Telephone Encounter (Signed)
Message copied by Bearl Mulberry on Thu Jan 01, 2013 10:33 AM ------      Message from: Ernestina Penna      Created: Thu Jan 01, 2013  7:34 AM       lfts wnl.      Bs,renal,electrolytes wnl      ldlc elevated,tc elevated,tg good------appt with cp for better control       ------

## 2013-01-02 ENCOUNTER — Telehealth: Payer: Self-pay | Admitting: Family Medicine

## 2013-01-05 NOTE — Telephone Encounter (Signed)
copy mailed  

## 2013-03-17 DIAGNOSIS — G3184 Mild cognitive impairment, so stated: Secondary | ICD-10-CM | POA: Insufficient documentation

## 2013-04-21 ENCOUNTER — Telehealth: Payer: Self-pay | Admitting: Family Medicine

## 2013-04-23 NOTE — Telephone Encounter (Signed)
SPOKE WITH PT AND WANTS TO KNOW IF NEEDS BONE DENSITY TEST. PT WILL DISCUSS AT NEXT OFFICE VISIT. PT WILL COME IN BEFORE APPT FOR LABS.

## 2013-04-24 ENCOUNTER — Other Ambulatory Visit (INDEPENDENT_AMBULATORY_CARE_PROVIDER_SITE_OTHER): Payer: Medicare Other

## 2013-04-24 DIAGNOSIS — R5383 Other fatigue: Secondary | ICD-10-CM

## 2013-04-24 DIAGNOSIS — R42 Dizziness and giddiness: Secondary | ICD-10-CM

## 2013-04-24 DIAGNOSIS — E559 Vitamin D deficiency, unspecified: Secondary | ICD-10-CM

## 2013-04-24 DIAGNOSIS — E785 Hyperlipidemia, unspecified: Secondary | ICD-10-CM

## 2013-04-24 LAB — BASIC METABOLIC PANEL WITH GFR
BUN: 24 mg/dL — ABNORMAL HIGH (ref 6–23)
Calcium: 9.7 mg/dL (ref 8.4–10.5)
Creat: 0.91 mg/dL (ref 0.50–1.10)
GFR, Est African American: 76 mL/min
Glucose, Bld: 87 mg/dL (ref 70–99)

## 2013-04-24 LAB — HEPATIC FUNCTION PANEL
ALT: 17 U/L (ref 0–35)
Alkaline Phosphatase: 73 U/L (ref 39–117)
Indirect Bilirubin: 0.4 mg/dL (ref 0.0–0.9)
Total Protein: 6.3 g/dL (ref 6.0–8.3)

## 2013-04-24 LAB — POCT CBC
Hemoglobin: 14.8 g/dL (ref 12.2–16.2)
MCH, POC: 31.6 pg — AB (ref 27–31.2)
MCV: 95.1 fL (ref 80–97)
Platelet Count, POC: 289 10*3/uL (ref 142–424)
RBC: 4.7 M/uL (ref 4.04–5.48)
WBC: 7.5 10*3/uL (ref 4.6–10.2)

## 2013-04-24 LAB — LIPID PANEL: LDL Cholesterol: 120 mg/dL — ABNORMAL HIGH (ref 0–99)

## 2013-04-27 NOTE — Progress Notes (Signed)
Patient came in for labs only.

## 2013-04-29 ENCOUNTER — Ambulatory Visit: Payer: Medicare Other

## 2013-04-29 DIAGNOSIS — Z23 Encounter for immunization: Secondary | ICD-10-CM

## 2013-05-11 ENCOUNTER — Encounter: Payer: Self-pay | Admitting: Family Medicine

## 2013-05-11 ENCOUNTER — Ambulatory Visit (INDEPENDENT_AMBULATORY_CARE_PROVIDER_SITE_OTHER): Payer: Medicare Other | Admitting: Family Medicine

## 2013-05-11 VITALS — BP 144/90 | HR 78 | Temp 97.6°F | Ht 62.25 in | Wt 125.0 lb

## 2013-05-11 DIAGNOSIS — Z860100 Personal history of colon polyps, unspecified: Secondary | ICD-10-CM | POA: Insufficient documentation

## 2013-05-11 DIAGNOSIS — I10 Essential (primary) hypertension: Secondary | ICD-10-CM

## 2013-05-11 DIAGNOSIS — Z23 Encounter for immunization: Secondary | ICD-10-CM

## 2013-05-11 DIAGNOSIS — E559 Vitamin D deficiency, unspecified: Secondary | ICD-10-CM

## 2013-05-11 DIAGNOSIS — E785 Hyperlipidemia, unspecified: Secondary | ICD-10-CM

## 2013-05-11 DIAGNOSIS — M1990 Unspecified osteoarthritis, unspecified site: Secondary | ICD-10-CM

## 2013-05-11 NOTE — Patient Instructions (Addendum)
Continue current medications. Continue good therapeutic lifestyle changes.  Fall precautions discussed with patient. Follow up as planned and earlier as needed.  Check blood pressures for 2-3 weeks (esp when feeling dizzy),  Bring report to Dr Christell Constant to review. Get Debrox drops (OTC) for earwax for the right ear canal Take Prilosec over-the-counter one daily one half hour before breakfast Return to clinic and pick up FOBT for checking for blood in the stool Consider taking a statin drug if the LDL cholesterol remains elevated If problems with hands continue, consider seeing one of the hand orthopedic specialist in [facility] orthopedic    Pneumococcal Vaccine, Polyvalent suspension for injection What is this medicine? PNEUMOCOCCAL VACCINE, POLYVALENT (NEU mo KOK al vak SEEN, pol ee VEY luhnt) is a vaccine to prevent pneumococcus bacteria infection. These bacteria are a major cause of ear infections, 'Strep throat' infections, and serious pneumonia, meningitis, or blood infections worldwide. These vaccines help the body to produce antibodies (protective substances) that help your body defend against these bacteria. This vaccine is recommended for infants and young children. This vaccine will not treat an infection. This medicine may be used for other purposes; ask your health care provider or pharmacist if you have questions. What should I tell my health care provider before I take this medicine? They need to know if you have any of these conditions: -bleeding problems -fever -immune system problems -low platelet count in the blood -seizures -an unusual or allergic reaction to pneumococcal vaccine, diphtheria toxoid, other vaccines, latex, other medicines, foods, dyes, or preservatives -pregnant or trying to get pregnant -breast-feeding How should I use this medicine? This vaccine is for injection into a muscle. It is given by a health care professional. A copy of Vaccine Information  Statements will be given before each vaccination. Read this sheet carefully each time. The sheet may change frequently. Talk to your pediatrician regarding the use of this medicine in children. While this drug may be prescribed for children as young as 20 weeks old for selected conditions, precautions do apply. Overdosage: If you think you have taken too much of this medicine contact a poison control center or emergency room at once. NOTE: This medicine is only for you. Do not share this medicine with others. What if I miss a dose? It is important not to miss your dose. Call your doctor or health care professional if you are unable to keep an appointment. What may interact with this medicine? -medicines for cancer chemotherapy -medicines that suppress your immune function -medicines that treat or prevent blood clots like warfarin, enoxaparin, and dalteparin -steroid medicines like prednisone or cortisone This list may not describe all possible interactions. Give your health care provider a list of all the medicines, herbs, non-prescription drugs, or dietary supplements you use. Also tell them if you smoke, drink alcohol, or use illegal drugs. Some items may interact with your medicine. What should I watch for while using this medicine? Mild fever and pain should go away in 3 days or less. Report any unusual symptoms to your doctor or health care professional. What side effects may I notice from receiving this medicine? Side effects that you should report to your doctor or health care professional as soon as possible: -allergic reactions like skin rash, itching or hives, swelling of the face, lips, or tongue -breathing problems -confused -fever over 102 degrees F -pain, tingling, numbness in the hands or feet -seizures -unusual bleeding or bruising -unusual muscle weakness Side effects that usually do not  require medical attention (report to your doctor or health care professional if they  continue or are bothersome): -aches and pains -diarrhea -fever of 102 degrees F or less -headache -irritable -loss of appetite -pain, tender at site where injected -trouble sleeping This list may not describe all possible side effects. Call your doctor for medical advice about side effects. You may report side effects to FDA at 1-800-FDA-1088. Where should I keep my medicine? This does not apply. This vaccine is given in a clinic, pharmacy, doctor's office, or other health care setting and will not be stored at home. NOTE: This sheet is a summary. It may not cover all possible information. If you have questions about this medicine, talk to your doctor, pharmacist, or health care provider.  2013, Elsevier/Gold Standard. (09/21/2008 10:17:22 AM)

## 2013-05-11 NOTE — Progress Notes (Signed)
Subjective:    Patient ID: Lynn [name], female    DOB: 04-23-1946, 67 y.o.   MRN: [id number]  HPI Pt here for follow up and management of chronic medical problems. These problems include hypertension, vitamin D deficiency, hyperlipidemia, generalized osteoarthritis, and dizziness. It is of note that we recently had to change her from Diovan to losartin 50. Patient indicates that her dizziness is worse with arising and certain movements. She cannot as of today correlate blood pressure readings with her dizziness. She said her home blood pressure readings have been running in the upper 130s over the upper 80s. Her recent labs were reviewed with her today and she indicates that she would prefer not to take any statin drug at this point in time. She would prefer to continue with diet and exercise. She specifically complains of joint pain in her hands but only takes one Voltaren a day. She does describe some occasional tightness in her chest. Home health maintenance parameters she is due for a repeat colonoscopy in March of 15. She also indicates that she does not go to [facility] for anymore followups on her memory issues. She does continue to see the psychologist in [place]. She denies symptoms of reflux but does describe occasional hoarseness .    Outpatient Encounter Prescriptions as of 05/11/2013  Medication Sig Dispense Refill  . aspirin 81 MG tablet Take 81 mg by mouth daily.      Marland Kitchen buPROPion (WELLBUTRIN XL) 150 MG 24 hr tablet Take 1 tablet by mouth daily.      . diclofenac (VOLTAREN) 75 MG EC tablet Take 1 tablet (75 mg total) by mouth 2 (two) times daily.  60 tablet  5  . losartan (COZAAR) 50 MG tablet Take 1 tablet (50 mg total) by mouth daily.  30 tablet  5  . [DISCONTINUED] chlorhexidine (PERIDEX) 0.12 % solution       . [DISCONTINUED] penicillin v potassium (VEETID) 500 MG tablet        No facility-administered encounter medications on file as of 05/11/2013.    Review  of Systems  Constitutional: Positive for appetite change (loss of appetite, and occ. burning in throat).  HENT: Negative.   Eyes: Negative.   Respiratory: Negative.   Cardiovascular: Negative.   Gastrointestinal: Negative.   Endocrine: Negative.   Genitourinary: Negative.   Musculoskeletal: Positive for arthralgias (hands are getting worse).  Skin: Negative.   Allergic/Immunologic: Negative.   Neurological: Negative.   Hematological: Negative.   Psychiatric/Behavioral: Negative.        Objective:   Physical Exam  Nursing note and vitals reviewed. Constitutional: She is oriented to person, place, and time. She appears well-developed and well-nourished. No distress.  HENT:  Head: Normocephalic and atraumatic.  Left Ear: External ear normal.  Nose: Nose normal.  Mouth/Throat: Oropharynx is clear and moist.  Cerumen in the right ear canal  Eyes: Conjunctivae and EOM are normal. Pupils are equal, round, and reactive to light. Right eye exhibits no discharge. Left eye exhibits no discharge. No scleral icterus.  Neck: Normal range of motion. Neck supple. No JVD present. No thyromegaly present.  There were no bruits in the neck  Cardiovascular: Normal rate, regular rhythm, normal heart sounds and intact distal pulses.  Exam reveals no gallop and no friction rub.   No murmur heard. At 72 per minute  Pulmonary/Chest: Effort normal and breath sounds normal. She has no wheezes. She has no rales.  Abdominal: Soft. Bowel sounds are normal. She exhibits  no mass. There is tenderness (there is slight epigastric tenderness). There is no rebound and no guarding.  Musculoskeletal: Normal range of motion. She exhibits no edema (there was no pedal edema) and no tenderness.  There is some slight swelling in her hands and slight tenderness  Lymphadenopathy:    She has no cervical adenopathy.  Neurological: She is alert and oriented to person, place, and time. She has normal reflexes. No cranial nerve  deficit.  Skin: Skin is warm and dry. No rash noted. She is not diaphoretic. No erythema.  Psychiatric: She has a normal mood and affect. Her behavior is normal. Judgment and thought content normal.    BP 144/90  Pulse 78  Temp(Src) 97.6 F (36.4 C) (Oral)  Ht 5' 2.25" (1.581 m)  Wt 125 lb (56.7 kg)  BMI 22.68 kg/m2       Assessment & Plan:   1. Hypertension   2. Osteoarthritis   3. Vitamin D deficiency   4. Personal history of colonic polyps   5. Hyperlipidemia   6. Need for prophylactic vaccination against Streptococcus pneumoniae (pneumococcus)    Orders Placed This Encounter  Procedures  . Pneumococcal conjugate vaccine 13-valent less than 5yo IM   Meds ordered this encounter  Medications  . buPROPion (WELLBUTRIN XL) 150 MG 24 hr tablet    Sig: Take 1 tablet by mouth daily.   Patient Instructions  Continue current medications. Continue good therapeutic lifestyle changes.  Fall precautions discussed with patient. Follow up as planned and earlier as needed.  Check blood pressures for 2-3 weeks (esp when feeling dizzy),  Bring report to Dr Christell Constant to review. Get Debrox drops (OTC) for earwax for the right ear canal Take Prilosec over-the-counter one daily one half hour before breakfast Return to clinic and pick up FOBT for checking for blood in the stool Consider taking a statin drug if the LDL cholesterol remains elevated If problems with hands continue, consider seeing one of the hand orthopedic specialist in [facility] orthopedic    Pneumococcal Vaccine, Polyvalent suspension for injection What is this medicine? PNEUMOCOCCAL VACCINE, POLYVALENT (NEU mo KOK al vak SEEN, pol ee VEY luhnt) is a vaccine to prevent pneumococcus bacteria infection. These bacteria are a major cause of ear infections, 'Strep throat' infections, and serious pneumonia, meningitis, or blood infections worldwide. These vaccines help the body to produce antibodies (protective substances) that  help your body defend against these bacteria. This vaccine is recommended for infants and young children. This vaccine will not treat an infection. This medicine may be used for other purposes; ask your health care provider or pharmacist if you have questions. What should I tell my health care provider before I take this medicine? They need to know if you have any of these conditions: -bleeding problems -fever -immune system problems -low platelet count in the blood -seizures -an unusual or allergic reaction to pneumococcal vaccine, diphtheria toxoid, other vaccines, latex, other medicines, foods, dyes, or preservatives -pregnant or trying to get pregnant -breast-feeding How should I use this medicine? This vaccine is for injection into a muscle. It is given by a health care professional. A copy of Vaccine Information Statements will be given before each vaccination. Read this sheet carefully each time. The sheet may change frequently. Talk to your pediatrician regarding the use of this medicine in children. While this drug may be prescribed for children as young as 7 weeks old for selected conditions, precautions do apply. Overdosage: If you think you have taken too  much of this medicine contact a poison control center or emergency room at once. NOTE: This medicine is only for you. Do not share this medicine with others. What if I miss a dose? It is important not to miss your dose. Call your doctor or health care professional if you are unable to keep an appointment. What may interact with this medicine? -medicines for cancer chemotherapy -medicines that suppress your immune function -medicines that treat or prevent blood clots like warfarin, enoxaparin, and dalteparin -steroid medicines like prednisone or cortisone This list may not describe all possible interactions. Give your health care provider a list of all the medicines, herbs, non-prescription drugs, or dietary supplements you use.  Also tell them if you smoke, drink alcohol, or use illegal drugs. Some items may interact with your medicine. What should I watch for while using this medicine? Mild fever and pain should go away in 3 days or less. Report any unusual symptoms to your doctor or health care professional. What side effects may I notice from receiving this medicine? Side effects that you should report to your doctor or health care professional as soon as possible: -allergic reactions like skin rash, itching or hives, swelling of the face, lips, or tongue -breathing problems -confused -fever over 102 degrees F -pain, tingling, numbness in the hands or feet -seizures -unusual bleeding or bruising -unusual muscle weakness Side effects that usually do not require medical attention (report to your doctor or health care professional if they continue or are bothersome): -aches and pains -diarrhea -fever of 102 degrees F or less -headache -irritable -loss of appetite -pain, tender at site where injected -trouble sleeping This list may not describe all possible side effects. Call your doctor for medical advice about side effects. You may report side effects to FDA at 1-800-FDA-1088. Where should I keep my medicine? This does not apply. This vaccine is given in a clinic, pharmacy, doctor's office, or other health care setting and will not be stored at home. NOTE: This sheet is a summary. It may not cover all possible information. If you have questions about this medicine, talk to your doctor, pharmacist, or health care provider.  2013, Elsevier/Gold Standard. (09/21/2008 10:17:22 AM)    Nyra Capes MD

## 2013-05-12 ENCOUNTER — Telehealth: Payer: Self-pay | Admitting: Family Medicine

## 2013-05-12 DIAGNOSIS — Z780 Asymptomatic menopausal state: Secondary | ICD-10-CM

## 2013-05-19 NOTE — Telephone Encounter (Signed)
Order placed

## 2013-05-28 ENCOUNTER — Telehealth: Payer: Self-pay

## 2013-05-28 NOTE — Telephone Encounter (Signed)
Taken care of this morning

## 2013-05-28 NOTE — Telephone Encounter (Signed)
Wants to know if there is a later appt on 06/24/13

## 2013-06-04 ENCOUNTER — Ambulatory Visit (INDEPENDENT_AMBULATORY_CARE_PROVIDER_SITE_OTHER): Payer: Medicare Other | Admitting: Family Medicine

## 2013-06-04 ENCOUNTER — Encounter: Payer: Self-pay | Admitting: Family Medicine

## 2013-06-04 ENCOUNTER — Ambulatory Visit: Payer: Medicare Other | Admitting: Family Medicine

## 2013-06-04 VITALS — BP 134/80 | HR 76 | Temp 97.3°F | Ht 62.25 in | Wt 124.0 lb

## 2013-06-04 DIAGNOSIS — R059 Cough, unspecified: Secondary | ICD-10-CM

## 2013-06-04 DIAGNOSIS — J019 Acute sinusitis, unspecified: Secondary | ICD-10-CM

## 2013-06-04 DIAGNOSIS — J069 Acute upper respiratory infection, unspecified: Secondary | ICD-10-CM

## 2013-06-04 DIAGNOSIS — I10 Essential (primary) hypertension: Secondary | ICD-10-CM

## 2013-06-04 DIAGNOSIS — R05 Cough: Secondary | ICD-10-CM

## 2013-06-04 MED ORDER — AMOXICILLIN 500 MG PO CAPS: 500.0000 mg | ORAL_CAPSULE | Freq: Three times a day (TID) | ORAL | Status: DC

## 2013-06-04 MED ORDER — FLUTICASONE PROPIONATE 50 MCG/ACT NA SUSP: 2.0000 | Freq: Every day | NASAL | Status: DC

## 2013-06-04 NOTE — Progress Notes (Signed)
Subjective:    Patient ID: Lynn [name], female    DOB: 07/12/1946, 67 y.o.   MRN: [id number]  HPI Patient here today for URI. Her symptoms are congestion and cough. She also has been having some headache. She does bring in some blood pressure readings for review and overall knees are good except for some readings late in October. Recently the numbers have been better. These outside readings will be scanned into the record. These upper respiratory symptoms have been going on for a couple of weeks and last night she felt chilled and the drainage seems to have turned to a different color.    Patient Active Problem List   Diagnosis Date Noted  . Hypertension 05/11/2013  . Osteoarthritis 05/11/2013  . Vitamin D deficiency 05/11/2013  . Personal history of colonic polyps 05/11/2013  . Hyperlipidemia 05/11/2013   Outpatient Encounter Prescriptions as of 06/04/2013  Medication Sig  . aspirin 81 MG tablet Take 81 mg by mouth daily.  Marland Kitchen buPROPion (WELLBUTRIN XL) 150 MG 24 hr tablet Take 1 tablet by mouth daily.  . diclofenac (VOLTAREN) 75 MG EC tablet Take 1 tablet (75 mg total) by mouth 2 (two) times daily.  Marland Kitchen losartan (COZAAR) 50 MG tablet Take 1 tablet (50 mg total) by mouth daily.    Review of Systems  Constitutional: Positive for chills and fatigue.  HENT: Positive for congestion and postnasal drip.   Eyes: Negative.   Respiratory: Positive for cough (worse at night) and chest tightness (soreness with cough).   Cardiovascular: Negative.   Gastrointestinal: Negative.   Endocrine: Negative.   Genitourinary: Negative.   Musculoskeletal: Negative.   Skin: Negative.   Allergic/Immunologic: Negative.   Neurological: Positive for dizziness and headaches.  Hematological: Negative.   Psychiatric/Behavioral: Negative.        Objective:   Physical Exam  Nursing note and vitals reviewed. Constitutional: She is oriented to person, place, and time. She appears well-developed and  well-nourished. No distress.  HENT:  Head: Normocephalic and atraumatic.  Right Ear: External ear normal.  Left Ear: External ear normal.  Mouth/Throat: Oropharynx is clear and moist. No oropharyngeal exudate.  There is bilateral nasal congestion  Eyes: Conjunctivae and EOM are normal. Pupils are equal, round, and reactive to light. Right eye exhibits no discharge. Left eye exhibits no discharge. No scleral icterus.  Neck: Normal range of motion. Neck supple. No thyromegaly present.  Cardiovascular: Normal rate, regular rhythm and normal heart sounds.   At 72 per minute  Pulmonary/Chest: Effort normal. No respiratory distress. She has no wheezes. She has no rales.  A few upper airway rhonchi with coughing  Musculoskeletal: Normal range of motion.  Neurological: She is alert and oriented to person, place, and time.  Skin: Skin is warm and dry. No rash noted. She is not diaphoretic.  Psychiatric: She has a normal mood and affect. Her behavior is normal. Judgment and thought content normal.   BP 134/80  Pulse 76  Temp(Src) 97.3 F (36.3 C) (Oral)  Ht 5' 2.25" (1.581 m)  Wt 124 lb (56.246 kg)  BMI 22.50 kg/m2  Home blood pressures were reviewed and some or higher than I would like for them today. Recheck of her blood pressure here about May and the right arm sitting was 132/88      Assessment & Plan:   1. Acute rhinosinusitis   2. URI (upper respiratory infection)   3. Cough    Meds ordered this encounter  Medications  . amoxicillin (  AMOXIL) 500 MG capsule    Sig: Take 1 capsule (500 mg total) by mouth 3 (three) times daily.    Dispense:  30 capsule    Refill:  0  . fluticasone (FLONASE) 50 MCG/ACT nasal spray    Sig: Place 2 sprays into both nostrils daily.    Dispense:  16 g    Refill:  6   Patient Instructions  Continue drinking plenty of fluid Take medication as directed Keep the home as cool as possible Using a cool mist humidifier would be helpful Continue to  take Mucinex maximum strength, blue and white in color, 1 twice daily with a large glass of water. This medication is over-the-counter. Watch sodium intake Bring your blood pressure monitor by an 2-3 weeks and check your blood pressure here with your monitor her and we will compare that to checking it with our monitor   Nyra Capes MD

## 2013-06-04 NOTE — Patient Instructions (Addendum)
Continue drinking plenty of fluid Take medication as directed Keep the home as cool as possible Using a cool mist humidifier would be helpful Continue to take Mucinex maximum strength, blue and white in color, 1 twice daily with a large glass of water. This medication is over-the-counter. Watch sodium intake Bring your blood pressure monitor by an 2-3 weeks and check your blood pressure here with your monitor her and we will compare that to checking it with our monitor

## 2013-06-17 ENCOUNTER — Telehealth: Payer: Self-pay | Admitting: Pharmacist

## 2013-06-17 NOTE — Telephone Encounter (Signed)
Patient called about changing appt from 15

## 2013-06-24 ENCOUNTER — Ambulatory Visit (INDEPENDENT_AMBULATORY_CARE_PROVIDER_SITE_OTHER): Payer: Medicare Other

## 2013-06-24 ENCOUNTER — Other Ambulatory Visit: Payer: Self-pay

## 2013-06-24 ENCOUNTER — Ambulatory Visit: Payer: Medicare Other

## 2013-06-24 ENCOUNTER — Other Ambulatory Visit: Payer: Medicare Other

## 2013-06-24 ENCOUNTER — Ambulatory Visit (INDEPENDENT_AMBULATORY_CARE_PROVIDER_SITE_OTHER): Payer: Medicare Other | Admitting: Pharmacist

## 2013-06-24 VITALS — BP 146/88 | HR 66 | Ht 62.5 in | Wt 125.0 lb

## 2013-06-24 DIAGNOSIS — Z780 Asymptomatic menopausal state: Secondary | ICD-10-CM

## 2013-06-24 DIAGNOSIS — M810 Age-related osteoporosis without current pathological fracture: Secondary | ICD-10-CM

## 2013-06-24 MED ORDER — RISEDRONATE SODIUM 35 MG PO TBEC: 1.0000 | DELAYED_RELEASE_TABLET | ORAL | Status: DC

## 2013-06-24 NOTE — Patient Instructions (Signed)

## 2013-06-24 NOTE — Progress Notes (Signed)
Patient ID: Lynn [name], female   DOB: 12/27/1945, 67 y.o.   MRN: [id number]  Osteoporosis Clinic Current Height: Height: 5' 2.5" (158.8 cm)      Max Lifetime Height:  5' 2.5" Current Weight: Weight: 125 lb (56.7 kg)       Ethnicity:Caucasian  BP: BP: 146/88 mmHg (pt's BP monitor was 151/92)      HR:  Pulse Rate: 66      HPI: Does pt already have a diagnosis of:  Osteopenia?  Yes Osteoporosis?  No Patient previously lived in [place] - reviewed previous DEXA results and show osteopenia though I could not find any reference to results or osteopenia in her chart records from previous PCP. Back Pain?  Yes       Kyphosis?  No Prior fracture?  No Med(s) for Osteoporosis/Osteopenia:  None Medspreviously tried for Osteoporosis/Osteopenia:  none                                                         PMH: Age at menopause:  Late 73' Hysterectomy?  No Oophorectomy?  No HRT? No Steroid Use?  No Thyroid med?  No History of cancer?  No History of digestive disorders (ie Crohn's)?  No Current or previous eating disorders?  No Last Vitamin D Result:  50 (04/2013) Last GFR Result:  76 (04/2013)   FH/SH: Family history of osteoporosis?  No Parent with history of hip fracture?  No Family history of breast cancer?  Yes - maternal grandmother Exercise?  No Smoking?  No Alcohol?  Yes - less than 3 oz of wine daily    Calcium Assessment Calcium Intake  # of servings/day  Calcium mg  Milk (8 oz) 1  x  300  = 300mg   Yogurt (4 oz) 1 x  200 = 200mg   Cheese (1 oz) 0 x  200 = 0  Other Calcium sources   250mg   Ca supplement 0 = 0   Estimated calcium intake per day 750mg     DEXA Results Date of Test T-Score for AP Spine L1-L4 T-Score for neck of Left Hip T-Score for Neck of Right Hip  06/24/2013 1.2 -2.8 -2.5  Results below from [facility]    02/21/2011 1.4 -2.0 -2.3  02/16/2009 1.4 -1.9 -1.9   Assessment: Osteoporosis of hip with worsening of  BMD Hypertension  Recommendations: 1.  Start  risedronate Precious Gilding) 35mg  1 tablet weekly  2.  recommend calcium 1200mg  daily through supplementation or diet.  3.  recommend weight bearing exercise - 30 minutes at least 4 days  per week.   4.  Counseled and educated about fall risk and prevention. 5.  Continue to check BP regularly - Her BP monitor appears to be accurate.  Call office if BP is greater than 145/85  Recheck DEXA:  2 years  Time spent counseling patient:  30 minutes  Henrene Pastor, PharmD, CPP

## 2013-06-30 ENCOUNTER — Telehealth: Payer: Self-pay | Admitting: Family Medicine

## 2013-07-01 NOTE — Telephone Encounter (Signed)
Pt is concerned about the possible side effects of the new med for bones- is there something different she can try that doesn't have stomach and esoph. Possible side effect and dental problems.  Wants something different   Her bp monitor is accurate with our machine- running well at home most of the time with a few 150-160/80's in the am- do you suggest anything for BP

## 2013-07-02 ENCOUNTER — Other Ambulatory Visit: Payer: Self-pay | Admitting: Nurse Practitioner

## 2013-07-02 MED ORDER — HYDROCHLOROTHIAZIDE 12.5 MG PO CAPS: 12.5000 mg | ORAL_CAPSULE | Freq: Every day | ORAL | Status: DC

## 2013-07-02 NOTE — Telephone Encounter (Signed)
Add HCTZ to meds for blood pressure ,rx sent to pharmacy- deferr questions about osteoporosis meds to [facility] or [facility]

## 2013-07-06 NOTE — Telephone Encounter (Signed)
Tried both phone numbers - left message.  Bisphosphonate is preferred for osteoporosis but if patient does not want to take due to concerns with side effects (which are very rara) then ok to try evista 60mg  daily.  Also another consideration for her BP would be to change losartan 50mg  qd to 1 tablet bid since BP good in pm but high in am.  Instead of adding HCTZ or if patient wants HCTZ in future recommend combo losartan-HCTZ option.

## 2013-07-08 NOTE — Telephone Encounter (Signed)
Called patient but she was with a group and unable to discuss questions.  Asked me to call back tomorrow.

## 2013-07-09 ENCOUNTER — Telehealth: Payer: Self-pay | Admitting: Pharmacist

## 2013-07-09 MED ORDER — LOSARTAN POTASSIUM 50 MG PO TABS: 50.0000 mg | ORAL_TABLET | Freq: Two times a day (BID) | ORAL | Status: DC

## 2013-07-09 MED ORDER — RALOXIFENE HCL 60 MG PO TABS: 60.0000 mg | ORAL_TABLET | Freq: Every day | ORAL | Status: DC

## 2013-07-09 NOTE — Telephone Encounter (Signed)
Called patient back today.  Discussed treatment options for osteoporosis. Addressed her concerns with side effects.  She has decided to try evista 60mg  1 tablet daily. Also increased losartan to 50mg  BID.  Continue to check BP daily.

## 2013-07-13 NOTE — Telephone Encounter (Signed)
Lynn [name] had added HCTZ 2 weeks ago but we discussed other changes so this medication was discontinued. Discussed with patient. Also called [facility] and cancelled the rx for HCTZ

## 2013-08-12 ENCOUNTER — Telehealth: Payer: Self-pay | Admitting: Family Medicine

## 2013-08-12 NOTE — Telephone Encounter (Signed)
PT NOTIFIED LAST TETANUS IN CHART 3/12. VERBALIZED UNDERSTANDING.

## 2013-08-15 ENCOUNTER — Other Ambulatory Visit: Payer: Self-pay | Admitting: Family Medicine

## 2013-08-15 MED ORDER — OSELTAMIVIR PHOSPHATE 75 MG PO CAPS: 75.0000 mg | ORAL_CAPSULE | Freq: Every day | ORAL | Status: DC

## 2013-08-15 NOTE — Telephone Encounter (Signed)
PLEASE CALL IN [facility] TO [facility] MOTHER HAS THE FLU WANTS SOMETHING TO PROTECT HER. PATIENT SITTING OUTSIDE IN VAN UNTIL WE CALL SOMETHING IN PATIENT WAITING WAS TOLD WE WOULD CALL HER WHEN IT WAS REVIEWED

## 2013-09-16 ENCOUNTER — Ambulatory Visit (INDEPENDENT_AMBULATORY_CARE_PROVIDER_SITE_OTHER): Payer: Medicare Other | Admitting: Family Medicine

## 2013-09-16 ENCOUNTER — Ambulatory Visit (INDEPENDENT_AMBULATORY_CARE_PROVIDER_SITE_OTHER): Payer: Medicare Other

## 2013-09-16 ENCOUNTER — Encounter: Payer: Self-pay | Admitting: Family Medicine

## 2013-09-16 VITALS — BP 159/88 | HR 72 | Temp 98.9°F | Ht 62.5 in | Wt 127.4 lb

## 2013-09-16 DIAGNOSIS — I10 Essential (primary) hypertension: Secondary | ICD-10-CM

## 2013-09-16 DIAGNOSIS — M1990 Unspecified osteoarthritis, unspecified site: Secondary | ICD-10-CM

## 2013-09-16 DIAGNOSIS — E785 Hyperlipidemia, unspecified: Secondary | ICD-10-CM

## 2013-09-16 DIAGNOSIS — M25552 Pain in left hip: Secondary | ICD-10-CM

## 2013-09-16 DIAGNOSIS — R0789 Other chest pain: Secondary | ICD-10-CM

## 2013-09-16 DIAGNOSIS — M25559 Pain in unspecified hip: Secondary | ICD-10-CM

## 2013-09-16 DIAGNOSIS — E559 Vitamin D deficiency, unspecified: Secondary | ICD-10-CM

## 2013-09-16 DIAGNOSIS — Z1211 Encounter for screening for malignant neoplasm of colon: Secondary | ICD-10-CM

## 2013-09-16 DIAGNOSIS — L659 Nonscarring hair loss, unspecified: Secondary | ICD-10-CM

## 2013-09-16 MED ORDER — RALOXIFENE HCL 60 MG PO TABS: 60.0000 mg | ORAL_TABLET | Freq: Every day | ORAL | Status: DC

## 2013-09-16 MED ORDER — DICLOFENAC SODIUM 75 MG PO TBEC: 75.0000 mg | DELAYED_RELEASE_TABLET | Freq: Two times a day (BID) | ORAL | Status: DC

## 2013-09-16 MED ORDER — LOSARTAN POTASSIUM 50 MG PO TABS: 50.0000 mg | ORAL_TABLET | Freq: Two times a day (BID) | ORAL | Status: DC

## 2013-09-16 MED ORDER — HYDROCHLOROTHIAZIDE 12.5 MG PO CAPS: 12.5000 mg | ORAL_CAPSULE | Freq: Every day | ORAL | Status: DC

## 2013-09-16 NOTE — Progress Notes (Signed)
Subjective:    Patient ID: Lynn [name], female    DOB: 07-14-1946, 68 y.o.   MRN: [id number]  HPI Pt here for follow up and management of chronic medical problems. The patient's blood pressure is up today. She is complaining of some left hip pain. The patient denies any injury. The patient also complains of some chest tightness with exertion. She indicates that her blood pressures have been elevated somewhat at home also.      Patient Active Problem List   Diagnosis Date Noted  . Osteoporosis 06/24/2013  . Hypertension 05/11/2013  . Osteoarthritis 05/11/2013  . Vitamin D deficiency 05/11/2013  . Personal history of colonic polyps 05/11/2013  . Hyperlipidemia 05/11/2013   Outpatient Encounter Prescriptions as of 09/16/2013  Medication Sig  . aspirin 81 MG tablet Take 81 mg by mouth daily.  . diclofenac (VOLTAREN) 75 MG EC tablet Take 1 tablet (75 mg total) by mouth 2 (two) times daily.  Marland Kitchen losartan (COZAAR) 50 MG tablet Take 1 tablet (50 mg total) by mouth 2 (two) times daily.  . raloxifene (EVISTA) 60 MG tablet Take 1 tablet (60 mg total) by mouth daily.  . [DISCONTINUED] ATELVIA 35 MG TBEC   . [DISCONTINUED] oseltamivir (TAMIFLU) 75 MG capsule Take 1 capsule (75 mg total) by mouth daily.    Review of Systems  Constitutional: Negative.   HENT: Negative.   Eyes: Negative.   Respiratory: Negative.   Cardiovascular: Negative.   Gastrointestinal: Negative.   Endocrine: Negative.   Genitourinary: Negative.   Musculoskeletal: Positive for arthralgias (left hip pain).  Skin: Negative.   Allergic/Immunologic: Negative.   Neurological: Negative.   Hematological: Negative.   Psychiatric/Behavioral: Negative.        Objective:   Physical Exam  Nursing note and vitals reviewed. Constitutional: She is oriented to person, place, and time. She appears well-developed and well-nourished. No distress.  HENT:  Head: Normocephalic and atraumatic.  Right Ear: External ear  normal.  Left Ear: External ear normal.  Nose: Nose normal.  Mouth/Throat: Oropharynx is clear and moist.  Eyes: Conjunctivae and EOM are normal. Pupils are equal, round, and reactive to light. Right eye exhibits no discharge. Left eye exhibits no discharge. No scleral icterus.  Neck: Normal range of motion. Neck supple. No thyromegaly present.  Cardiovascular: Normal rate, regular rhythm, normal heart sounds and intact distal pulses.  Exam reveals no gallop and no friction rub.   No murmur heard. At 72 per minute  Pulmonary/Chest: Effort normal and breath sounds normal. No respiratory distress. She has no wheezes. She has no rales. She exhibits no tenderness.  Abdominal: Soft. Bowel sounds are normal. She exhibits no mass. There is no tenderness. There is no rebound and no guarding.  Musculoskeletal: Normal range of motion. She exhibits no edema and no tenderness.  Lymphadenopathy:    She has no cervical adenopathy.  Neurological: She is alert and oriented to person, place, and time. She has normal reflexes. No cranial nerve deficit.  It is noted that the patient was somewhat hesitant and her responses to understanding her medication and how to take the medication. I assured her that we would write everything down for her so that she would understand this.  Skin: Skin is warm and dry.  Psychiatric: She has a normal mood and affect. Her behavior is normal. Judgment and thought content normal.   BP 159/88  Pulse 72  Temp(Src) 98.9 F (37.2 C) (Oral)  Ht 5' 2.5" (1.588 m)  [facility]  127 lb 6.4 oz (57.788 kg)  BMI 22.92 kg/m2  EKG:--- No change since January 2013                               [facility] reading (PRIMARY) by  Dr. Brunilda Payor x-ray-no active disease                                        Assessment & Plan:  1. Hyperlipidemia - POCT CBC; Future - NMR, lipoprofile; Future  2. Hypertension - DG Chest 2 View; Future - POCT CBC; Future - BMP8+EGFR; Future - Hepatic function  panel; Future - hydrochlorothiazide (MICROZIDE) 12.5 MG capsule; Take 1 capsule (12.5 mg total) by mouth daily.  Dispense: 30 capsule; Refill: 5  3. Osteoarthritis - POCT CBC; Future - DG Hip Complete Left; Future  4. Vitamin D deficiency - POCT CBC; Future - Vit D  25 hydroxy (rtn osteoporosis monitoring); Future  5. Thinning hair - Thyroid Panel With TSH; Future  6. Special screening for malignant neoplasms, colon - Ambulatory referral to Gastroenterology  7. Left hip pain - DG Hip Complete Left; Future  8. Chest tightness  Meds ordered this encounter  Medications  . DISCONTD: ATELVIA 35 MG TBEC    Sig:   . diclofenac (VOLTAREN) 75 MG EC tablet    Sig: Take 1 tablet (75 mg total) by mouth 2 (two) times daily.    Dispense:  60 tablet    Refill:  5  . losartan (COZAAR) 50 MG tablet    Sig: Take 1 tablet (50 mg total) by mouth 2 (two) times daily.    Dispense:  60 tablet    Refill:  5  . raloxifene (EVISTA) 60 MG tablet    Sig: Take 1 tablet (60 mg total) by mouth daily.    Dispense:  30 tablet    Refill:  5  . hydrochlorothiazide (MICROZIDE) 12.5 MG capsule    Sig: Take 1 capsule (12.5 mg total) by mouth daily.    Dispense:  30 capsule    Refill:  5     Patient Instructions                       Medicare Annual Wellness Visit  [facility] and the medical providers at [facility] strive to bring you the best medical care.  In doing so we not only want to address your current medical conditions and concerns but also to detect new conditions early and prevent illness, disease and health-related problems.    Medicare offers a yearly Wellness Visit which allows our clinical staff to assess your need for preventative services including immunizations, lifestyle education, counseling to decrease risk of preventable diseases and screening for fall risk and other medical concerns.    This visit is provided free of charge (no copay) for all Medicare  recipients. The clinical pharmacists at [facility] have begun to conduct these Wellness Visits which will also include a thorough review of all your medications.    As you primary medical provider recommend that you make an appointment for your Annual Wellness Visit if you have not done so already this year.  You may set up this appointment before you leave today or you may call back [phone]) and schedule an appointment.  Please make sure  when you call that you mention that you are scheduling your Annual Wellness Visit with the clinical pharmacist so that the appointment may be made for the proper length of time.         Continue current medications. Continue good therapeutic lifestyle changes which include good diet and exercise. Fall precautions discussed with patient. If an FOBT was given today- please return it to our front desk. If you are over 16 years old - you may need Prevnar 35 or the adult Pneumonia vaccine.  Please return blood pressures for review in 2 weeks Take the additional H CTZ  12.5 in the morning for your blood pressure along with losartan For the next 10 days to 2 weeks, increase the diclofenac to the twice daily one after breakfast and one after supper While you are doing that take a Prilosec every morning before breakfast to protect your stomach If your left hip continues to give you pain when you return to the clinic in 2 weeks you'll need to get a hip x-ray At that time we will also get a BMP to make sure that your potassium is okay with taking the HCTZ                        Medicare Annual Wellness Visit  [facility] and the medical providers at [facility] strive to bring you the best medical care.  In doing so we not only want to address your current medical conditions and concerns but also to detect new conditions early and prevent illness, disease and health-related problems.    Medicare offers a yearly  Wellness Visit which allows our clinical staff to assess your need for preventative services including immunizations, lifestyle education, counseling to decrease risk of preventable diseases and screening for fall risk and other medical concerns.    This visit is provided free of charge (no copay) for all Medicare recipients. The clinical pharmacists at [facility] have begun to conduct these Wellness Visits which will also include a thorough review of all your medications.    As you primary medical provider recommend that you make an appointment for your Annual Wellness Visit if you have not done so already this year.  You may set up this appointment before you leave today or you may call back [phone]) and schedule an appointment.  Please make sure when you call that you mention that you are scheduling your Annual Wellness Visit with the clinical pharmacist so that the appointment may be made for the proper length of time.       Arrie Senate MD

## 2013-09-16 NOTE — Patient Instructions (Addendum)
Medicare Annual Wellness Visit  Lynn [name] and the medical providers at [facility] strive to bring you the best medical care.  In doing so we not only want to address your current medical conditions and concerns but also to detect new conditions early and prevent illness, disease and health-related problems.    Medicare offers a yearly Wellness Visit which allows our clinical staff to assess your need for preventative services including immunizations, lifestyle education, counseling to decrease risk of preventable diseases and screening for fall risk and other medical concerns.    This visit is provided free of charge (no copay) for all Medicare recipients. The clinical pharmacists at [facility] have begun to conduct these Wellness Visits which will also include a thorough review of all your medications.    As you primary medical provider recommend that you make an appointment for your Annual Wellness Visit if you have not done so already this year.  You may set up this appointment before you leave today or you may call back ([phone]) and schedule an appointment.  Please make sure when you call that you mention that you are scheduling your Annual Wellness Visit with the clinical pharmacist so that the appointment may be made for the proper length of time.         Continue current medications. Continue good therapeutic lifestyle changes which include good diet and exercise. Fall precautions discussed with patient. If an FOBT was given today- please return it to our front desk. If you are over 22 years old - you may need Prevnar 35 or the adult Pneumonia vaccine.  Please return blood pressures for review in 2 weeks Take the additional H CTZ  12.5 in the morning for your blood pressure along with losartan For the next 10 days to 2 weeks, increase the diclofenac to the twice daily one after breakfast and one after supper While  you are doing that take a Prilosec every morning before breakfast to protect your stomach If your left hip continues to give you pain when you return to the clinic in 2 weeks you'll need to get a hip x-ray At that time we will also get a BMP to make sure that your potassium is okay with taking the HCTZ                        Medicare Annual Wellness Visit  Lynn [name] and the medical providers at [facility] strive to bring you the best medical care.  In doing so we not only want to address your current medical conditions and concerns but also to detect new conditions early and prevent illness, disease and health-related problems.    Medicare offers a yearly Wellness Visit which allows our clinical staff to assess your need for preventative services including immunizations, lifestyle education, counseling to decrease risk of preventable diseases and screening for fall risk and other medical concerns.    This visit is provided free of charge (no copay) for all Medicare recipients. The clinical pharmacists at [facility] have begun to conduct these Wellness Visits which will also include a thorough review of all your medications.    As you primary medical provider recommend that you make an appointment for your Annual Wellness Visit if you have not done so already this year.  You may set up this appointment before you leave today or you may call back ([phone]) and schedule an appointment.  Please make sure when you call that you mention that you are scheduling your Annual Wellness Visit with the clinical pharmacist so that the appointment may be made for the proper length of time.

## 2013-09-22 ENCOUNTER — Other Ambulatory Visit (INDEPENDENT_AMBULATORY_CARE_PROVIDER_SITE_OTHER): Payer: Medicare Other

## 2013-09-22 ENCOUNTER — Other Ambulatory Visit: Payer: Medicare Other

## 2013-09-22 DIAGNOSIS — Z1212 Encounter for screening for malignant neoplasm of rectum: Secondary | ICD-10-CM

## 2013-09-22 DIAGNOSIS — E559 Vitamin D deficiency, unspecified: Secondary | ICD-10-CM

## 2013-09-22 DIAGNOSIS — M1990 Unspecified osteoarthritis, unspecified site: Secondary | ICD-10-CM

## 2013-09-22 DIAGNOSIS — I10 Essential (primary) hypertension: Secondary | ICD-10-CM

## 2013-09-22 DIAGNOSIS — E785 Hyperlipidemia, unspecified: Secondary | ICD-10-CM

## 2013-09-22 DIAGNOSIS — L659 Nonscarring hair loss, unspecified: Secondary | ICD-10-CM

## 2013-09-22 LAB — POCT CBC
GRANULOCYTE PERCENT: 75.1 % (ref 37–80)
HCT, POC: 45.3 % (ref 37.7–47.9)
HEMOGLOBIN: 14.4 g/dL (ref 12.2–16.2)
Lymph, poc: 1.2 (ref 0.6–3.4)
MCH, POC: 30.5 pg (ref 27–31.2)
MCHC: 31.7 g/dL — AB (ref 31.8–35.4)
MCV: 96.2 fL (ref 80–97)
MPV: 7.8 fL (ref 0–99.8)
POC Granulocyte: 5.1 (ref 2–6.9)
POC LYMPH PERCENT: 18 %L (ref 10–50)
Platelet Count, POC: 262 10*3/uL (ref 142–424)
RBC: 4.7 M/uL (ref 4.04–5.48)
RDW, POC: 12.9 %
WBC: 6.8 10*3/uL (ref 4.6–10.2)

## 2013-09-22 NOTE — Progress Notes (Signed)
PT CAME IN FOR LABS ONLY 

## 2013-09-23 ENCOUNTER — Encounter: Payer: Self-pay | Admitting: *Deleted

## 2013-09-23 LAB — FECAL OCCULT BLOOD, IMMUNOCHEMICAL: FECAL OCCULT BLD: NEGATIVE

## 2013-09-23 NOTE — Progress Notes (Signed)
Quick Note:  Copy of labs sent to patient ______ 

## 2013-09-24 LAB — NMR, LIPOPROFILE
CHOLESTEROL: 193 mg/dL (ref <200)
HDL Cholesterol by NMR: 68 mg/dL (ref >=40)
HDL Particle Number: 33.5 umol/L (ref >=30.5)
LDL PARTICLE NUMBER: 1001 nmol/L — AB (ref <1000)
LDL Size: 21.3 nm (ref >20.5)
LDLC SERPL CALC-MCNC: 108 mg/dL — ABNORMAL HIGH (ref <100)
LP-IR Score: <25 (ref <=45)
SMALL LDL PARTICLE NUMBER: 159 nmol/L (ref <=527)
TRIGLYCERIDES BY NMR: 83 mg/dL (ref <150)

## 2013-09-24 LAB — HEPATIC FUNCTION PANEL
ALT: 18 IU/L (ref 0–32)
AST: 11 IU/L (ref 0–40)
Albumin: 4.2 g/dL (ref 3.6–4.8)
Alkaline Phosphatase: 77 IU/L (ref 39–117)
BILIRUBIN DIRECT: 0.13 mg/dL (ref 0.00–0.40)
BILIRUBIN TOTAL: 0.4 mg/dL (ref 0.0–1.2)
TOTAL PROTEIN: 6 g/dL (ref 6.0–8.5)

## 2013-09-24 LAB — BMP8+EGFR
BUN/Creatinine Ratio: 27 — ABNORMAL HIGH (ref 11–26)
BUN: 21 mg/dL (ref 8–27)
CALCIUM: 9.7 mg/dL (ref 8.7–10.3)
CO2: 26 mmol/L (ref 18–29)
Chloride: 103 mmol/L (ref 97–108)
Creatinine, Ser: 0.79 mg/dL (ref 0.57–1.00)
GFR calc Af Amer: 89 mL/min/{1.73_m2} (ref >59)
GFR, EST NON AFRICAN AMERICAN: 77 mL/min/{1.73_m2} (ref >59)
GLUCOSE: 88 mg/dL (ref 65–99)
Potassium: 4.8 mmol/L (ref 3.5–5.2)
Sodium: 142 mmol/L (ref 134–144)

## 2013-09-24 LAB — THYROID PANEL WITH TSH
Free Thyroxine Index: 2.1 (ref 1.2–4.9)
T3 UPTAKE RATIO: 33 % (ref 24–39)
T4, Total: 6.5 ug/dL (ref 4.5–12.0)
TSH: 2.11 u[IU]/mL (ref 0.450–4.500)

## 2013-09-24 LAB — VITAMIN D 25 HYDROXY (VIT D DEFICIENCY, FRACTURES): Vit D, 25-Hydroxy: 35.7 ng/mL (ref 30.0–100.0)

## 2013-09-25 ENCOUNTER — Encounter: Payer: Self-pay | Admitting: Internal Medicine

## 2013-09-25 ENCOUNTER — Telehealth: Payer: Self-pay | Admitting: Family Medicine

## 2013-09-25 NOTE — Telephone Encounter (Signed)
Numbers in epic are both busy

## 2013-09-25 NOTE — Telephone Encounter (Signed)
Message copied by Waverly Ferrari on Fri Sep 25, 2013 10:41 AM ------      Message from: Chipper Herb      Created: Thu Sep 24, 2013  7:32 AM       Blood sugar,renal function and electrolytes are all within normal       Liver function tests are within normal      Advanced lipid testing, the total LDL particle number is 1001. The goal is to be less than 1000. The triglycerides are good. The HDL particle number, the good cholesterol, is good also. Continue aggressive therapeutic lifestyle changes which include diet and exercise      All thyroid function tests are within normal limits      The vitamin D level is 35.7, increase vitamin D3 by 1000 daily ------

## 2013-10-02 ENCOUNTER — Telehealth: Payer: Self-pay | Admitting: Internal Medicine

## 2013-10-02 NOTE — Telephone Encounter (Signed)
Patient reports that she had a dental procedure that she had difficulty waking up from.  She is going to find out the name of the sedation used and call me back.  She is aware that she may need to be rescheduled to see a MD.

## 2013-10-05 ENCOUNTER — Telehealth: Payer: Self-pay | Admitting: Internal Medicine

## 2013-10-05 ENCOUNTER — Encounter: Payer: Self-pay | Admitting: *Deleted

## 2013-10-05 ENCOUNTER — Telehealth: Payer: Self-pay | Admitting: Family Medicine

## 2013-10-05 NOTE — Telephone Encounter (Signed)
Patient reports that she has the information about sedation at the dentist, but is out and needs to call me back when she gets home

## 2013-10-05 NOTE — Telephone Encounter (Signed)
Patient reports that when she was Versed 5 and Nalbuthine 5 mg and it took her a long time to wake up and she was sleepy all day.  She states that she went back and called the dentist and they told her that this was not abnormal.  She is advised that we use propofol and these are different drugs.  She is asked to keep the appt on 10/26/13 for pre-visit.

## 2013-10-06 NOTE — Telephone Encounter (Signed)
CXR from 2013 did not show emphysematous changes. I don't have any others to compare to.

## 2013-10-06 NOTE — Telephone Encounter (Addendum)
Emphysema but no acute findings.  Discussed the results in great detail. Patient doesn't recall having this show up on an xray in the past. She moved from [place] several years ago and questions whether we have a CXR to compare to. I will check her paper chart and call her back if there is a significant change in the CXR. She does complain of mild shortness of breath at times but thinks it may be due to being sedentary this winter. Suggested Alpha 1 testing at next appt because father had COPD. He was a smoker though and the patient was a light smoker a long time ago.  Offered a pulmonary referral so that they can discuss this in greater detail. She opted to wait on that.

## 2013-10-12 ENCOUNTER — Other Ambulatory Visit (INDEPENDENT_AMBULATORY_CARE_PROVIDER_SITE_OTHER): Payer: Medicare Other

## 2013-10-12 ENCOUNTER — Other Ambulatory Visit: Payer: Self-pay | Admitting: *Deleted

## 2013-10-12 DIAGNOSIS — E878 Other disorders of electrolyte and fluid balance, not elsewhere classified: Secondary | ICD-10-CM

## 2013-10-12 NOTE — Progress Notes (Signed)
Pt came in for labs only 

## 2013-10-13 LAB — BMP8+EGFR
BUN/Creatinine Ratio: 27 — ABNORMAL HIGH (ref 11–26)
BUN: 21 mg/dL (ref 8–27)
CALCIUM: 10.2 mg/dL (ref 8.7–10.3)
CHLORIDE: 100 mmol/L (ref 97–108)
CO2: 25 mmol/L (ref 18–29)
Creatinine, Ser: 0.79 mg/dL (ref 0.57–1.00)
GFR calc Af Amer: 89 mL/min/{1.73_m2} (ref >59)
GFR calc non Af Amer: 77 mL/min/{1.73_m2} (ref >59)
GLUCOSE: 93 mg/dL (ref 65–99)
POTASSIUM: 4.1 mmol/L (ref 3.5–5.2)
Sodium: 140 mmol/L (ref 134–144)

## 2013-10-26 ENCOUNTER — Telehealth: Payer: Self-pay | Admitting: Gastroenterology

## 2013-10-26 ENCOUNTER — Ambulatory Visit (AMBULATORY_SURGERY_CENTER): Payer: Self-pay | Admitting: *Deleted

## 2013-10-26 VITALS — Ht 62.5 in | Wt 127.8 lb

## 2013-10-26 DIAGNOSIS — Z86010 Personal history of colonic polyps: Secondary | ICD-10-CM

## 2013-10-26 MED ORDER — MOVIPREP 100 G PO SOLR: 1.0000 | Freq: Once | ORAL | Status: DC

## 2013-10-26 NOTE — Progress Notes (Signed)
Denies allergies to eggs or soy products. Denies complications with sedation or anesthesia. 

## 2013-10-26 NOTE — Telephone Encounter (Signed)
Spoke to to pt told her I received a call from the pharmacy and her insurance will not cover the prep for her colonoscopy, I will be mailing her out a free voucher to take to the pharmacy. Pt verbalized understanding and wanted to know when she will hear from [place] about whether her colonoscopy will be covered, I told her give it at least until Friday of this week is she hasn't heard anything to call me and I will see what I can do to find out for her.

## 2013-10-29 ENCOUNTER — Encounter: Payer: Self-pay | Admitting: Internal Medicine

## 2013-10-29 ENCOUNTER — Telehealth: Payer: Self-pay | Admitting: Internal Medicine

## 2013-11-03 ENCOUNTER — Telehealth: Payer: Self-pay | Admitting: Internal Medicine

## 2013-11-03 NOTE — Telephone Encounter (Signed)
See phone note dated 10/26/2013

## 2013-11-03 NOTE — Telephone Encounter (Signed)
Spoke with pt regarding helpful hints while drinking the prep.I advised pt to try not to drink the prep too fast, suck on hard candy,rinse her mouth out after drinking some of the prep, stop the prep if she is very nauseated and let her stomach rest and then resume after.If she continues to have a lot problems and unable to keep the prep down,she was advised to call our office or the doctor on call.Pt understood.

## 2013-11-04 ENCOUNTER — Telehealth: Payer: Self-pay | Admitting: Family Medicine

## 2013-11-04 NOTE — Telephone Encounter (Signed)
We have had trouble getting in touch with the patient. She should increase her low Sartain to 50 mg twice a day and continue the HCTZ as she has been doing. We should review blood pressures again in a couple of weeks.

## 2013-11-04 NOTE — Telephone Encounter (Signed)
Pt notified of DWMs  recommendations Verbalizes understanding

## 2013-11-09 ENCOUNTER — Ambulatory Visit (AMBULATORY_SURGERY_CENTER): Payer: Medicare Other | Admitting: Internal Medicine

## 2013-11-09 ENCOUNTER — Telehealth: Payer: Self-pay | Admitting: Internal Medicine

## 2013-11-09 ENCOUNTER — Encounter: Payer: Self-pay | Admitting: Internal Medicine

## 2013-11-09 VITALS — BP 131/63 | HR 56 | Temp 98.7°F | Resp 27 | Ht 62.25 in | Wt 127.0 lb

## 2013-11-09 DIAGNOSIS — D126 Benign neoplasm of colon, unspecified: Secondary | ICD-10-CM

## 2013-11-09 DIAGNOSIS — Z860100 Personal history of colon polyps, unspecified: Secondary | ICD-10-CM

## 2013-11-09 DIAGNOSIS — D128 Benign neoplasm of rectum: Secondary | ICD-10-CM

## 2013-11-09 DIAGNOSIS — Z1211 Encounter for screening for malignant neoplasm of colon: Secondary | ICD-10-CM

## 2013-11-09 DIAGNOSIS — D129 Benign neoplasm of anus and anal canal: Secondary | ICD-10-CM

## 2013-11-09 MED ORDER — SODIUM CHLORIDE 0.9 % IV SOLN: 500.0000 mL | INTRAVENOUS | Status: DC

## 2013-11-09 NOTE — Progress Notes (Signed)
Report to pacu rn, vss, bbs=clear, warm compress on IV DCed sites Right hand

## 2013-11-09 NOTE — Op Note (Signed)
[address]  [employer]. [place], [address]   COLONOSCOPY PROCEDURE REPORT  PATIENT: Lynn [name], Lynn [name]  MR#: [id number] BIRTHDATE: 27-Dec-1945 , 67  yrs. old GENDER: Female ENDOSCOPIST: Jerene Bears, MD REFERRED RK:[id number] Laurance Flatten, M.D. PROCEDURE DATE:  11/09/2013 PROCEDURE:   Colonoscopy with cold biopsy polypectomy First Screening Colonoscopy - Avg.  risk and is 50 yrs.  old or older - No.  Prior Negative Screening - Now for repeat screening. N/A  History of Adenoma - Now for follow-up colonoscopy & has been > or = to 3 yrs.  Yes hx of adenoma.  Has been 3 or more years since last colonoscopy.  Polyps Removed Today? Yes. ASA CLASS:   Class III INDICATIONS:elevated risk screening and Patient's personal history of colon polyps. MEDICATIONS: MAC sedation, administered by CRNA and propofol (Diprivan) 250mg  IV  DESCRIPTION OF PROCEDURE:   After the risks benefits and alternatives of the procedure were thoroughly explained, informed consent was obtained.  A digital rectal exam revealed no rectal mass.   The LB [id number]  endoscope was introduced through the anus and advanced to the cecum, which was identified by both the appendix and ileocecal valve. No adverse events experienced. The quality of the prep was good, using MoviPrep  The instrument was then slowly withdrawn as the colon was fully examined.   COLON FINDINGS: A sessile polyp measuring 4 mm in size was found in the rectum.  A polypectomy was performed with cold forceps.  The resection was complete and the polyp tissue was completely retrieved.   There was mild diverticulosis noted in the sigmoid colon with associated tortuosity.   The colon mucosa was otherwise normal.  Retroflexed views revealed no abnormalities. The time to cecum=6 minutes 53 seconds.  Withdrawal time=17 minutes 41 seconds. The scope was withdrawn and the procedure completed. COMPLICATIONS: There were no  complications.  ENDOSCOPIC IMPRESSION: 1.   Sessile polyp measuring 4 mm in size was found in the rectum; polypectomy was performed with cold forceps 2.   There was mild diverticulosis noted in the sigmoid colon 3.   The colon mucosa was otherwise normal  RECOMMENDATIONS: 1.  Await pathology results 2.  High fiber diet 3.  Repeat Colonoscopy in 5 years. 4.  You will receive a letter within 1-2 weeks with the results of your biopsy as well as final recommendations.  Please call my office if you have not received a letter after 3 weeks.   eSigned:  Jerene Bears, MD 11/09/2013 3:05 PM cc: The Patient and Redge Gainer, MD

## 2013-11-09 NOTE — Telephone Encounter (Signed)
Spoke with patient, and she stated that she is still having diarrhea from yesterday's prep.  Stated that she spoke with the "doc on call" about 7:30am.   He told her to take half of the prep and see how she does.   She stated that she was "worried about driving to [facility] with out soiling her clothes."   I told her to bring towels or wear a pad if needed, but that she needed to take half of the prep as the Doctor told her.   She reluctantly agreed.   Her appointment is at 2pm today, and she will be in at 1pm.

## 2013-11-09 NOTE — Progress Notes (Signed)
Called to room to assist during endoscopic procedure.  Patient ID and intended procedure confirmed with present staff. Received instructions for my participation in the procedure from the performing physician.  

## 2013-11-09 NOTE — Telephone Encounter (Signed)
She is having urgent watery stools this AM after last PM prep - sound fairly clear - concerned about stooling while driving to office later today.  I rec she take 1/2 second dose of Moviprep.

## 2013-11-09 NOTE — Patient Instructions (Signed)
YOU HAD AN ENDOSCOPIC PROCEDURE TODAY AT THE [facility]: Refer to the procedure report that was given to you for any specific questions about what was found during the examination.  If the procedure report does not answer your questions, please call your gastroenterologist to clarify.  If you requested that your care partner not be given the details of your procedure findings, then the procedure report has been included in a sealed envelope for you to review at your convenience later.  YOU SHOULD EXPECT: Some feelings of bloating in the abdomen. Passage of more gas than usual.  Walking can help get rid of the air that was put into your GI tract during the procedure and reduce the bloating. If you had a lower endoscopy (such as a colonoscopy or flexible sigmoidoscopy) you may notice spotting of blood in your stool or on the toilet paper. If you underwent a bowel prep for your procedure, then you may not have a normal bowel movement for a few days.  DIET: Your first meal following the procedure should be a light meal and then it is ok to progress to your normal diet.  A half-sandwich or bowl of soup is an example of a good first meal.  Heavy or fried foods are harder to digest and may make you feel nauseous or bloated.  Likewise meals heavy in dairy and vegetables can cause extra gas to form and this can also increase the bloating.  Drink plenty of fluids but you should avoid alcoholic beverages for 24 hours.  ACTIVITY: Your care partner should take you home directly after the procedure.  You should plan to take it easy, moving slowly for the rest of the day.  You can resume normal activity the day after the procedure however you should NOT DRIVE or use heavy machinery for 24 hours (because of the sedation medicines used during the test).    SYMPTOMS TO REPORT IMMEDIATELY: A gastroenterologist can be reached at any hour.  During normal business hours, 8:30 AM to 5:00 PM Monday through Friday,  call [phone].  After hours and on weekends, please call the GI answering service at [phone] who will take a message and have the physician on call contact you.   Following lower endoscopy (colonoscopy or flexible sigmoidoscopy):  Excessive amounts of blood in the stool  Significant tenderness or worsening of abdominal pains  Swelling of the abdomen that is new, acute  Fever of 100F or higher    FOLLOW UP: If any biopsies were taken you will be contacted by phone or by letter within the next 1-3 weeks.  Call your gastroenterologist if you have not heard about the biopsies in 3 weeks.  Our staff will call the home number listed on your records the next business day following your procedure to check on you and address any questions or concerns that you may have at that time regarding the information given to you following your procedure. This is a courtesy call and so if there is no answer at the home number and we have not heard from you through the emergency physician on call, we will assume that you have returned to your regular daily activities without incident.  SIGNATURES/CONFIDENTIALITY: You and/or your care partner have signed paperwork which will be entered into your electronic medical record.  These signatures attest to the fact that that the information above on your After Visit Summary has been reviewed and is understood.  Full responsibility of the confidentiality   of this discharge information lies with you and/or your care-partner.  Polyp, diverticulosis, high fiber diet information given.  Next colonoscopy 5 years-2020.  Results of pathology will come in a letter after Nov 21, 2013.

## 2013-11-09 NOTE — Progress Notes (Signed)
IV right wrist discontinued.  767mls 0.9%NS infused. Site clear with no redness or edema.

## 2013-11-09 NOTE — Progress Notes (Signed)
IV restarted in left wrist after trying x 2 other sites, IV presenting in right wrist catheter was completely out of skin and not covered by opsite.Freeflowing IV restarted 22g, left wrist.

## 2013-11-10 ENCOUNTER — Telehealth: Payer: Self-pay | Admitting: *Deleted

## 2013-11-10 NOTE — Telephone Encounter (Signed)
  Follow up Call-  Call back number 11/09/2013  Post procedure Call Back phone  # [phone]  Permission to leave phone message Yes     Patient questions:  Message left to call if necessary.

## 2013-11-23 ENCOUNTER — Encounter: Payer: Self-pay | Admitting: Internal Medicine

## 2013-11-30 ENCOUNTER — Telehealth: Payer: Self-pay | Admitting: Internal Medicine

## 2013-11-30 NOTE — Telephone Encounter (Signed)
Patient notified.  She is that a letter is on the way and that she will need a colonoscopy in 5 years

## 2013-12-01 ENCOUNTER — Other Ambulatory Visit: Payer: Self-pay

## 2013-12-01 DIAGNOSIS — Z1231 Encounter for screening mammogram for malignant neoplasm of breast: Secondary | ICD-10-CM

## 2013-12-03 ENCOUNTER — Other Ambulatory Visit: Payer: Self-pay

## 2013-12-03 ENCOUNTER — Ambulatory Visit
Admission: RE | Admit: 2013-12-03 | Discharge: 2013-12-03 | Disposition: A | Discharge status: Home / Self Care | Payer: Medicare Other | Source: Ambulatory Visit

## 2013-12-03 DIAGNOSIS — Z1231 Encounter for screening mammogram for malignant neoplasm of breast: Secondary | ICD-10-CM

## 2014-01-14 ENCOUNTER — Ambulatory Visit: Payer: Medicare Other | Admitting: Family Medicine

## 2014-01-19 ENCOUNTER — Ambulatory Visit: Payer: Medicare Other | Admitting: Family Medicine

## 2014-02-09 ENCOUNTER — Telehealth: Payer: Self-pay | Admitting: Family Medicine

## 2014-02-09 NOTE — Telephone Encounter (Signed)
Please call family member and discuss what is going on and if patient will be willing to see anyone

## 2014-02-09 NOTE — Telephone Encounter (Signed)
They would like for you to call on her so they can tell you what's going on. When you have time they said you know about her case and they want to refer her to a physiatrist.

## 2014-02-10 NOTE — Telephone Encounter (Signed)
Patient lives with her mom and she is very depressed and is just a very negative person. Asking the same questions over and over. Unable to to try and make her life any better is worrying her mom. They are going to make her an appointment with a physiatrist. They just wanted to let you know.

## 2014-02-10 NOTE — Telephone Encounter (Signed)
I spoke with the brother last night and they are planning to arrange a visit with me with the mother to discuss this with Lynn [name]

## 2014-02-17 ENCOUNTER — Telehealth: Payer: Self-pay | Admitting: Family Medicine

## 2014-02-17 DIAGNOSIS — E785 Hyperlipidemia, unspecified: Secondary | ICD-10-CM

## 2014-02-17 DIAGNOSIS — I10 Essential (primary) hypertension: Secondary | ICD-10-CM

## 2014-02-17 DIAGNOSIS — E559 Vitamin D deficiency, unspecified: Secondary | ICD-10-CM

## 2014-02-17 NOTE — Telephone Encounter (Signed)
Pt aware to come by fasting and orders are in

## 2014-02-19 ENCOUNTER — Other Ambulatory Visit (INDEPENDENT_AMBULATORY_CARE_PROVIDER_SITE_OTHER): Payer: Medicare Other

## 2014-02-19 DIAGNOSIS — E785 Hyperlipidemia, unspecified: Secondary | ICD-10-CM

## 2014-02-19 DIAGNOSIS — E559 Vitamin D deficiency, unspecified: Secondary | ICD-10-CM

## 2014-02-19 DIAGNOSIS — I10 Essential (primary) hypertension: Secondary | ICD-10-CM

## 2014-02-19 LAB — POCT CBC
Granulocyte percent: 77.7 %G (ref 37–80)
HEMATOCRIT: 42.3 % (ref 37.7–47.9)
Hemoglobin: 14.3 g/dL (ref 12.2–16.2)
Lymph, poc: 1.6 (ref 0.6–3.4)
MCH: 32.5 pg — AB (ref 27–31.2)
MCHC: 33.9 g/dL (ref 31.8–35.4)
MCV: 96 fL (ref 80–97)
MPV: 8.3 fL (ref 0–99.8)
POC Granulocyte: 6.7 (ref 2–6.9)
POC LYMPH %: 18.3 % (ref 10–50)
Platelet Count, POC: 266 10*3/uL (ref 142–424)
RBC: 4.4 M/uL (ref 4.04–5.48)
RDW, POC: 13.3 %
WBC: 8.6 10*3/uL (ref 4.6–10.2)

## 2014-02-20 LAB — BMP8+EGFR
BUN/Creatinine Ratio: 31 — ABNORMAL HIGH (ref 11–26)
BUN: 25 mg/dL (ref 8–27)
CO2: 26 mmol/L (ref 18–29)
CREATININE: 0.8 mg/dL (ref 0.57–1.00)
Calcium: 9.7 mg/dL (ref 8.7–10.3)
Chloride: 102 mmol/L (ref 97–108)
GFR calc Af Amer: 88 mL/min/{1.73_m2} (ref >59)
GFR, EST NON AFRICAN AMERICAN: 76 mL/min/{1.73_m2} (ref >59)
GLUCOSE: 80 mg/dL (ref 65–99)
Potassium: 4 mmol/L (ref 3.5–5.2)
Sodium: 141 mmol/L (ref 134–144)

## 2014-02-20 LAB — HEPATIC FUNCTION PANEL
ALT: 16 IU/L (ref 0–32)
AST: 14 IU/L (ref 0–40)
Albumin: 4.2 g/dL (ref 3.6–4.8)
Alkaline Phosphatase: 63 IU/L (ref 39–117)
BILIRUBIN DIRECT: 0.14 mg/dL (ref 0.00–0.40)
BILIRUBIN TOTAL: 0.5 mg/dL (ref 0.0–1.2)
TOTAL PROTEIN: 6 g/dL (ref 6.0–8.5)

## 2014-02-20 LAB — NMR, LIPOPROFILE
CHOLESTEROL: 188 mg/dL (ref 100–199)
HDL Cholesterol by NMR: 71 mg/dL (ref >39)
HDL PARTICLE NUMBER: 38.2 umol/L (ref >=30.5)
LDL Particle Number: 1074 nmol/L — ABNORMAL HIGH (ref <1000)
LDL SIZE: 21.2 nm (ref >20.5)
LDLC SERPL CALC-MCNC: 105 mg/dL — ABNORMAL HIGH (ref 0–99)
LP-IR Score: <25 (ref <=45)
SMALL LDL PARTICLE NUMBER: 116 nmol/L (ref <=527)
TRIGLYCERIDES BY NMR: 61 mg/dL (ref 0–149)

## 2014-02-20 LAB — VITAMIN D 25 HYDROXY (VIT D DEFICIENCY, FRACTURES): Vit D, 25-Hydroxy: 34.8 ng/mL (ref 30.0–100.0)

## 2014-02-22 ENCOUNTER — Ambulatory Visit (INDEPENDENT_AMBULATORY_CARE_PROVIDER_SITE_OTHER): Payer: Medicare Other | Admitting: Family Medicine

## 2014-02-22 ENCOUNTER — Encounter: Payer: Self-pay | Admitting: Family Medicine

## 2014-02-22 VITALS — BP 126/88 | HR 77 | Temp 97.0°F | Ht 62.25 in | Wt 125.0 lb

## 2014-02-22 DIAGNOSIS — F32A Depression, unspecified: Secondary | ICD-10-CM

## 2014-02-22 DIAGNOSIS — F320 Major depressive disorder, single episode, mild: Secondary | ICD-10-CM

## 2014-02-22 DIAGNOSIS — R42 Dizziness and giddiness: Secondary | ICD-10-CM

## 2014-02-22 DIAGNOSIS — I10 Essential (primary) hypertension: Secondary | ICD-10-CM

## 2014-02-22 DIAGNOSIS — E559 Vitamin D deficiency, unspecified: Secondary | ICD-10-CM

## 2014-02-22 DIAGNOSIS — F329 Major depressive disorder, single episode, unspecified: Secondary | ICD-10-CM

## 2014-02-22 DIAGNOSIS — E785 Hyperlipidemia, unspecified: Secondary | ICD-10-CM

## 2014-02-22 DIAGNOSIS — F3289 Other specified depressive episodes: Secondary | ICD-10-CM

## 2014-02-22 DIAGNOSIS — R300 Dysuria: Secondary | ICD-10-CM

## 2014-02-22 LAB — POCT URINALYSIS DIPSTICK
Bilirubin, UA: NEGATIVE
Blood, UA: NEGATIVE
GLUCOSE UA: NEGATIVE
KETONES UA: NEGATIVE
Nitrite, UA: NEGATIVE
Protein, UA: NEGATIVE
Urobilinogen, UA: NEGATIVE
pH, UA: 6

## 2014-02-22 LAB — POCT UA - MICROSCOPIC ONLY
BACTERIA, U MICROSCOPIC: NEGATIVE
CRYSTALS, UR, HPF, POC: NEGATIVE
Casts, Ur, LPF, POC: NEGATIVE
MUCUS UA: NEGATIVE
RBC, URINE, MICROSCOPIC: NEGATIVE

## 2014-02-22 MED ORDER — LOSARTAN POTASSIUM 50 MG PO TABS: 50.0000 mg | ORAL_TABLET | Freq: Two times a day (BID) | ORAL | Status: DC

## 2014-02-22 MED ORDER — DICLOFENAC SODIUM 75 MG PO TBEC: 75.0000 mg | DELAYED_RELEASE_TABLET | Freq: Two times a day (BID) | ORAL | Status: DC

## 2014-02-22 MED ORDER — HYDROCHLOROTHIAZIDE 12.5 MG PO CAPS: 12.5000 mg | ORAL_CAPSULE | Freq: Every day | ORAL | Status: DC

## 2014-02-22 MED ORDER — RALOXIFENE HCL 60 MG PO TABS: 60.0000 mg | ORAL_TABLET | Freq: Every day | ORAL | Status: DC

## 2014-02-22 NOTE — Addendum Note (Signed)
Addended by: Earlene Plater on: 02/22/2014 02:14 PM   Modules accepted: Orders

## 2014-02-22 NOTE — Addendum Note (Signed)
Addended by: Pollyann Kennedy F on: 02/22/2014 03:37 PM   Modules accepted: Orders

## 2014-02-22 NOTE — Progress Notes (Signed)
Subjective:    Patient ID: Lynn [name], female    DOB: July 08, 1946, 68 y.o.   MRN: [id number]  HPI Pt here for follow up and management of chronic medical problems. The patient has her ongoing problems with her multiple joint issues. She also complains of some incontinence. She also complains of some lightheadedness. The patient indicates that she has been working outside a lot in the heat.        Patient Active Problem List   Diagnosis Date Noted  . Osteoporosis 06/24/2013  . Hypertension 05/11/2013  . Osteoarthritis 05/11/2013  . Vitamin D deficiency 05/11/2013  . Personal history of colonic polyps 05/11/2013  . Hyperlipidemia 05/11/2013   Outpatient Encounter Prescriptions as of 02/22/2014  Medication Sig  . aspirin 81 MG tablet Take 81 mg by mouth daily.  . calcium carbonate (OS-CAL) 600 MG TABS tablet Take 600 mg by mouth daily with breakfast.   . diclofenac (VOLTAREN) 75 MG EC tablet Take 1 tablet (75 mg total) by mouth 2 (two) times daily.  . hydrochlorothiazide (MICROZIDE) 12.5 MG capsule Take 1 capsule (12.5 mg total) by mouth daily.  Marland Kitchen losartan (COZAAR) 50 MG tablet Take 1 tablet (50 mg total) by mouth 2 (two) times daily.  . raloxifene (EVISTA) 60 MG tablet Take 1 tablet (60 mg total) by mouth daily.    Review of Systems  Constitutional: Negative.   HENT: Negative.   Eyes: Negative.   Respiratory: Negative.   Cardiovascular: Negative.   Gastrointestinal: Negative.   Endocrine: Negative.   Genitourinary: Negative.        Urine leakage  Musculoskeletal: Negative.   Skin: Negative.   Allergic/Immunologic: Negative.   Neurological: Positive for light-headedness.  Hematological: Negative.   Psychiatric/Behavioral: Negative.    A depression screen was done and this was positive. The patient scored a 7/27 Ocean sprain which puts her in the mild category.     Objective:   Physical Exam  Nursing note and vitals reviewed. Constitutional: She is oriented  to person, place, and time. She appears well-developed and well-nourished. No distress.  HENT:  Head: Normocephalic and atraumatic.  Right Ear: External ear normal.  Left Ear: External ear normal.  Nose: Nose normal.  Mouth/Throat: Oropharynx is clear and moist. No oropharyngeal exudate.  Eyes: Conjunctivae and EOM are normal. Pupils are equal, round, and reactive to light. Right eye exhibits no discharge. Left eye exhibits no discharge. No scleral icterus.  Neck: Normal range of motion. Neck supple. No thyromegaly present.  No carotid bruits  Cardiovascular: Normal rate, regular rhythm, normal heart sounds and intact distal pulses.  Exam reveals no gallop and no friction rub.   No murmur heard. At 60 per minute  Pulmonary/Chest: Effort normal and breath sounds normal. No respiratory distress. She has no wheezes. She has no rales. She exhibits no tenderness.  No axillary adenopathy  Abdominal: Soft. Bowel sounds are normal. She exhibits no mass. There is no tenderness. There is no rebound and no guarding.  No abdominal bruit, there is slight epigastric, good inguinal pulses without adenopathy  Musculoskeletal: Normal range of motion. She exhibits tenderness. She exhibits no edema.  There is some tenderness in the supra-scapula area on the right side.  Lymphadenopathy:    She has no cervical adenopathy.  Neurological: She is alert and oriented to person, place, and time. No cranial nerve deficit.  Right lower extremity reflexes are slightly decreased compared to the left  Skin: Skin is warm and dry. No rash  noted.  Psychiatric: She has a normal mood and affect. Her behavior is normal. Judgment and thought content normal.   BP 126/88  Pulse 77  Temp(Src) 97 F (36.1 C) (Oral)  Ht 5' 2.25" (1.581 m)  Wt 125 lb (56.7 kg)  BMI 22.68 kg/m2        Assessment & Plan:  1. Hyperlipidemia  2. Essential hypertension - hydrochlorothiazide (MICROZIDE) 12.5 MG capsule; Take 1 capsule (12.5  mg total) by mouth daily.  Dispense: 30 capsule; Refill: 5  3. Vitamin D deficiency  4. Intermittent lightheadedness -Monitor blood pressures more closely as directed and drink plenty of water when working outside  5. Mild depression -Appointment with a psychiatrist for further evaluation, patient aware  6. Urinary incontinence -Urinalysis -Refer to urologist if problem continues  Meds ordered this encounter  Medications  . hydrochlorothiazide (MICROZIDE) 12.5 MG capsule    Sig: Take 1 capsule (12.5 mg total) by mouth daily.    Dispense:  30 capsule    Refill:  5  . raloxifene (EVISTA) 60 MG tablet    Sig: Take 1 tablet (60 mg total) by mouth daily.    Dispense:  30 tablet    Refill:  5  . losartan (COZAAR) 50 MG tablet    Sig: Take 1 tablet (50 mg total) by mouth 2 (two) times daily.    Dispense:  60 tablet    Refill:  5  . diclofenac (VOLTAREN) 75 MG EC tablet    Sig: Take 1 tablet (75 mg total) by mouth 2 (two) times daily.    Dispense:  60 tablet    Refill:  5   Patient Instructions                       Medicare Annual Wellness Visit  [facility] and the medical providers at [facility] strive to bring you the best medical care.  In doing so we not only want to address your current medical conditions and concerns but also to detect new conditions early and prevent illness, disease and health-related problems.    Medicare offers a yearly Wellness Visit which allows our clinical staff to assess your need for preventative services including immunizations, lifestyle education, counseling to decrease risk of preventable diseases and screening for fall risk and other medical concerns.    This visit is provided free of charge (no copay) for all Medicare recipients. The clinical pharmacists at [facility] have begun to conduct these Wellness Visits which will also include a thorough review of all your medications.    As you primary  medical provider recommend that you make an appointment for your Annual Wellness Visit if you have not done so already this year.  You may set up this appointment before you leave today or you may call back ([phone]) and schedule an appointment.  Please make sure when you call that you mention that you are scheduling your Annual Wellness Visit with the clinical pharmacist so that the appointment may be made for the proper length of time.     Continue current medications. Continue good therapeutic lifestyle changes which include good diet and exercise. Fall precautions discussed with patient. If an FOBT was given today- please return it to our front desk. If you are over 41 years old - you may need Prevnar 37 or the adult Pneumonia vaccine.  If the problem with the incontinence continues, please call us, and we will make arrangements  for you to see a urologist that comes to our community Be sure and get your pelvic and Pap smear done We will arrange for you to have a visit with the psychiatrist, Dr. Sheralyn Boatman, to evaluate your depression. Stay as active as possible Check your blood pressures if you feel lightheaded and record them for [place] Bring blood pressure readings on return visit We will call you with the results of the urinalysis once those results are available Continue to be careful and did not climb or put yourself at risk for falling Start taking vitamin D3 1000 daily as directed   Arrie Senate MD

## 2014-02-22 NOTE — Patient Instructions (Addendum)
Medicare Annual Wellness Visit  [facility] and the medical providers at [facility] strive to bring you the best medical care.  In doing so we not only want to address your current medical conditions and concerns but also to detect new conditions early and prevent illness, disease and health-related problems.    Medicare offers a yearly Wellness Visit which allows our clinical staff to assess your need for preventative services including immunizations, lifestyle education, counseling to decrease risk of preventable diseases and screening for fall risk and other medical concerns.    This visit is provided free of charge (no copay) for all Medicare recipients. The clinical pharmacists at [facility] have begun to conduct these Wellness Visits which will also include a thorough review of all your medications.    As you primary medical provider recommend that you make an appointment for your Annual Wellness Visit if you have not done so already this year.  You may set up this appointment before you leave today or you may call back [phone]) and schedule an appointment.  Please make sure when you call that you mention that you are scheduling your Annual Wellness Visit with the clinical pharmacist so that the appointment may be made for the proper length of time.     Continue current medications. Continue good therapeutic lifestyle changes which include good diet and exercise. Fall precautions discussed with patient. If an FOBT was given today- please return it to our front desk. If you are over 20 years old - you may need Prevnar 33 or the adult Pneumonia vaccine.  If the problem with the incontinence continues, please call us, and we will make arrangements for you to see a urologist that comes to our community Be sure and get your pelvic and Pap smear done We will arrange for you to have a visit with the psychiatrist, Dr. Sheralyn Boatman, to evaluate your depression. Stay as active as possible Check your blood pressures if you feel lightheaded and record them for [place] Bring blood pressure readings on return visit We will call you with the results of the urinalysis once those results are available Continue to be careful and did not climb or put yourself at risk for falling Start taking vitamin D3 1000 daily as directed

## 2014-02-23 ENCOUNTER — Telehealth: Payer: Self-pay | Admitting: Family Medicine

## 2014-02-23 NOTE — Telephone Encounter (Signed)
Patient aware if dosen't have symptoms dont have to be seen

## 2014-02-24 ENCOUNTER — Other Ambulatory Visit: Payer: Self-pay | Admitting: *Deleted

## 2014-02-24 DIAGNOSIS — N390 Urinary tract infection, site not specified: Secondary | ICD-10-CM

## 2014-02-24 LAB — URINE CULTURE

## 2014-03-02 ENCOUNTER — Telehealth: Payer: Self-pay | Admitting: *Deleted

## 2014-03-02 NOTE — Telephone Encounter (Signed)
Spoke with pt and she wants to think about counselor/psych referral  - she will let us know if we need to do this, but does not want this done at this time.

## 2014-03-02 NOTE — Telephone Encounter (Signed)
Lm to discuss possible appt with phych/counselor -jhb  Orders not placed until we talk

## 2014-03-04 ENCOUNTER — Other Ambulatory Visit (INDEPENDENT_AMBULATORY_CARE_PROVIDER_SITE_OTHER): Payer: Medicare Other

## 2014-03-04 DIAGNOSIS — N390 Urinary tract infection, site not specified: Secondary | ICD-10-CM

## 2014-03-04 LAB — POCT UA - MICROSCOPIC ONLY
BACTERIA, U MICROSCOPIC: NEGATIVE
CASTS, UR, LPF, POC: NEGATIVE
CRYSTALS, UR, HPF, POC: NEGATIVE
MUCUS UA: NEGATIVE
Yeast, UA: NEGATIVE

## 2014-03-04 LAB — POCT URINALYSIS DIPSTICK
Bilirubin, UA: NEGATIVE
Glucose, UA: NEGATIVE
Ketones, UA: NEGATIVE
Leukocytes, UA: NEGATIVE
Nitrite, UA: NEGATIVE
PROTEIN UA: NEGATIVE
RBC UA: NEGATIVE
Spec Grav, UA: 1.01
UROBILINOGEN UA: NEGATIVE
pH, UA: 6.5

## 2014-03-04 NOTE — Progress Notes (Signed)
Pt came in for lab  only 

## 2014-03-05 LAB — URINE CULTURE

## 2014-03-09 ENCOUNTER — Telehealth: Payer: Self-pay | Admitting: Family Medicine

## 2014-03-10 NOTE — Telephone Encounter (Signed)
Spoke with pt regarding appt to counseling She has had some involvement with [facility] and wanted to know if you could suggest anyone else Please advise

## 2014-03-10 NOTE — Telephone Encounter (Signed)
The last discussion that I had with the patient from our office  was that she did not want to go to a therapist now. We had made arrangements for her to have her records reviewed by a female therapist due the patient having Medicare . When we told her that, it was my understanding that she did not want to go. Please confirm with patient what she would like to do.

## 2014-03-10 NOTE — Telephone Encounter (Signed)
The office we'll have to find another group that takes Medicare. Find the group and will see who is available in that group that the patient can talk to

## 2014-03-16 ENCOUNTER — Other Ambulatory Visit: Payer: Medicare Other | Admitting: Nurse Practitioner

## 2014-04-13 ENCOUNTER — Encounter: Payer: Self-pay | Admitting: Nurse Practitioner

## 2014-04-13 ENCOUNTER — Ambulatory Visit (INDEPENDENT_AMBULATORY_CARE_PROVIDER_SITE_OTHER): Payer: Medicare Other | Admitting: Nurse Practitioner

## 2014-04-13 VITALS — BP 141/88 | HR 83 | Temp 97.0°F | Ht 62.0 in | Wt 127.4 lb

## 2014-04-13 DIAGNOSIS — Z124 Encounter for screening for malignant neoplasm of cervix: Secondary | ICD-10-CM

## 2014-04-13 DIAGNOSIS — Z0000 Encounter for general adult medical examination without abnormal findings: Secondary | ICD-10-CM

## 2014-04-13 DIAGNOSIS — Z01419 Encounter for gynecological examination (general) (routine) without abnormal findings: Secondary | ICD-10-CM

## 2014-04-13 DIAGNOSIS — R350 Frequency of micturition: Secondary | ICD-10-CM

## 2014-04-13 LAB — POCT URINALYSIS DIPSTICK
Bilirubin, UA: NEGATIVE
GLUCOSE UA: NEGATIVE
Ketones, UA: NEGATIVE
Leukocytes, UA: NEGATIVE
NITRITE UA: NEGATIVE
Protein, UA: NEGATIVE
RBC UA: NEGATIVE
Urobilinogen, UA: NEGATIVE
pH, UA: 5

## 2014-04-13 LAB — POCT UA - MICROSCOPIC ONLY
Casts, Ur, LPF, POC: NEGATIVE
Crystals, Ur, HPF, POC: NEGATIVE
EPITHELIAL CELLS, URINE PER MICROSCOPY: NEGATIVE
Mucus, UA: NEGATIVE
RBC, URINE, MICROSCOPIC: NEGATIVE
Yeast, UA: NEGATIVE

## 2014-04-13 NOTE — Patient Instructions (Signed)

## 2014-04-13 NOTE — Progress Notes (Signed)
Subjective:    Patient ID: Lynn [name], female    DOB: 03/08/1946, 68 y.o.   MRN: [id number]  HPI Lynn [name] is a regular patient of Dr. Laurance Flatten that was sent here today for Pap and Pelvic. Her only compliant is urgency and frequency of urine sinc she had a UTI several months ago- otherwise she is doing well.    Review of Systems  Constitutional: Negative.   HENT: Negative.   Respiratory: Negative.   Cardiovascular: Negative.   Gastrointestinal: Negative.   Genitourinary: Positive for urgency and frequency. Negative for dysuria and pelvic pain.  Neurological: Negative.   Psychiatric/Behavioral: Negative.        Objective:   Physical Exam  Constitutional: She is oriented to person, place, and time. She appears well-developed and well-nourished.  HENT:  Head: Normocephalic.  Right Ear: Hearing, tympanic membrane, external ear and ear canal normal.  Left Ear: Hearing, tympanic membrane, external ear and ear canal normal.  Nose: Nose normal.  Mouth/Throat: Uvula is midline and oropharynx is clear and moist.  Eyes: Conjunctivae and EOM are normal. Pupils are equal, round, and reactive to light.  Neck: Normal range of motion and full passive range of motion without pain. Neck supple. No JVD present. Carotid bruit is not present. No mass and no thyromegaly present.  Cardiovascular: Normal rate, normal heart sounds and intact distal pulses.   No murmur heard. Pulmonary/Chest: Effort normal and breath sounds normal. Right breast exhibits no inverted nipple, no mass, no nipple discharge, no skin change and no tenderness. Left breast exhibits no inverted nipple, no mass, no nipple discharge, no skin change and no tenderness.  Abdominal: Soft. Bowel sounds are normal. She exhibits no mass. There is no tenderness.  Genitourinary: Vagina normal and uterus normal. No breast swelling, tenderness, discharge or bleeding.  bimanual exam-No adnexal masses or tenderness. Cervical stenosis  and pink- no discharge  Musculoskeletal: Normal range of motion.  Lymphadenopathy:    She has no cervical adenopathy.  Neurological: She is alert and oriented to person, place, and time.  Skin: Skin is warm and dry.  Psychiatric: She has a normal mood and affect. Her behavior is normal. Judgment and thought content normal.   BP 141/88  Pulse 83  Temp(Src) 97 F (36.1 C) (Oral)  Ht 5\' 2"  (1.575 m)  Wt 127 lb 6.4 oz (57.788 kg)  BMI 23.30 kg/m2  Results for orders placed in visit on 04/13/14  POCT URINALYSIS DIPSTICK      Result Value Ref Range   Color, UA gold     Clarity, UA cleart     Glucose, UA neg     Bilirubin, UA neg     Ketones, UA neg     Spec Grav, UA <=1.005     Blood, UA neg     pH, UA 5.0     Protein, UA neg     Urobilinogen, UA negative     Nitrite, UA neg     Leukocytes, UA Negative    POCT UA - MICROSCOPIC ONLY      Result Value Ref Range   WBC, Ur, HPF, POC occ     RBC, urine, microscopic neg     Bacteria, U Microscopic occ     Mucus, UA neg     Epithelial cells, urine per micros neg     Crystals, Ur, HPF, POC neg     Casts, Ur, LPF, POC neg     Yeast, UA neg  Assessment & Plan:   1. Annual physical exam    Keep follow up with Dr. Laurance Flatten Drink cranberry juice daily and avpid caffeine Follow up prn  Mary-Margaret Hassell Done, FNP

## 2014-04-15 LAB — PAP IG (IMAGE GUIDED): PAP SMEAR COMMENT: 0

## 2014-04-28 ENCOUNTER — Telehealth: Payer: Self-pay | Admitting: Nurse Practitioner

## 2014-04-28 NOTE — Telephone Encounter (Signed)
Lmtcb. Pap printed and mailed.

## 2014-05-18 ENCOUNTER — Ambulatory Visit (INDEPENDENT_AMBULATORY_CARE_PROVIDER_SITE_OTHER): Payer: Medicare Other

## 2014-05-18 DIAGNOSIS — Z23 Encounter for immunization: Secondary | ICD-10-CM

## 2014-06-22 ENCOUNTER — Telehealth: Payer: Self-pay | Admitting: Family Medicine

## 2014-06-22 NOTE — Telephone Encounter (Signed)
I can see where Dr.Moore mentioned Psych eval and that pt is aware, wasn't sure who he wanted her to see.

## 2014-06-22 NOTE — Telephone Encounter (Signed)
Please refer patient to Dr. Sula Rumple the circumstances . I think that they may require that the patient call herself to make the appointment

## 2014-06-22 NOTE — Telephone Encounter (Signed)
Do you remember the name of who all we had thought about [name] seeing for psych and counseling?

## 2014-06-22 NOTE — Telephone Encounter (Signed)
Lynn [name] aware pt has appt on Friday - will discuss then

## 2014-06-23 ENCOUNTER — Telehealth: Payer: Self-pay | Admitting: Family Medicine

## 2014-06-23 NOTE — Telephone Encounter (Signed)
Lynn [name]- is who [facility] prefers Brother is aware

## 2014-06-25 ENCOUNTER — Encounter: Payer: Self-pay | Admitting: Family Medicine

## 2014-06-25 ENCOUNTER — Ambulatory Visit (INDEPENDENT_AMBULATORY_CARE_PROVIDER_SITE_OTHER): Payer: Medicare Other | Admitting: Family Medicine

## 2014-06-25 VITALS — BP 123/83 | HR 79 | Temp 98.0°F | Ht 62.0 in | Wt 129.0 lb

## 2014-06-25 DIAGNOSIS — R2681 Unsteadiness on feet: Secondary | ICD-10-CM

## 2014-06-25 DIAGNOSIS — N393 Stress incontinence (female) (male): Secondary | ICD-10-CM

## 2014-06-25 DIAGNOSIS — R202 Paresthesia of skin: Secondary | ICD-10-CM

## 2014-06-25 DIAGNOSIS — E785 Hyperlipidemia, unspecified: Secondary | ICD-10-CM

## 2014-06-25 DIAGNOSIS — H9193 Unspecified hearing loss, bilateral: Secondary | ICD-10-CM

## 2014-06-25 DIAGNOSIS — I10 Essential (primary) hypertension: Secondary | ICD-10-CM

## 2014-06-25 DIAGNOSIS — F32A Depression, unspecified: Secondary | ICD-10-CM | POA: Insufficient documentation

## 2014-06-25 DIAGNOSIS — R32 Unspecified urinary incontinence: Secondary | ICD-10-CM | POA: Insufficient documentation

## 2014-06-25 DIAGNOSIS — E559 Vitamin D deficiency, unspecified: Secondary | ICD-10-CM

## 2014-06-25 DIAGNOSIS — H9190 Unspecified hearing loss, unspecified ear: Secondary | ICD-10-CM | POA: Insufficient documentation

## 2014-06-25 DIAGNOSIS — R29898 Other symptoms and signs involving the musculoskeletal system: Secondary | ICD-10-CM

## 2014-06-25 DIAGNOSIS — F329 Major depressive disorder, single episode, unspecified: Secondary | ICD-10-CM

## 2014-06-25 LAB — POCT URINALYSIS DIPSTICK
BILIRUBIN UA: NEGATIVE
Blood, UA: NEGATIVE
GLUCOSE UA: NEGATIVE
Ketones, UA: NEGATIVE
Leukocytes, UA: NEGATIVE
Nitrite, UA: NEGATIVE
Protein, UA: NEGATIVE
Spec Grav, UA: 1.025
Urobilinogen, UA: NEGATIVE
pH, UA: 6

## 2014-06-25 LAB — POCT UA - MICROSCOPIC ONLY
Bacteria, U Microscopic: NEGATIVE
Casts, Ur, LPF, POC: NEGATIVE
Crystals, Ur, HPF, POC: NEGATIVE
Mucus, UA: NEGATIVE
RBC, urine, microscopic: NEGATIVE
WBC, UR, HPF, POC: NEGATIVE
Yeast, UA: NEGATIVE

## 2014-06-25 NOTE — Patient Instructions (Addendum)
Medicare Annual Wellness Visit  [facility] and the medical providers at [facility] strive to bring you the best medical care.  In doing so we not only want to address your current medical conditions and concerns but also to detect new conditions early and prevent illness, disease and health-related problems.    Medicare offers a yearly Wellness Visit which allows our clinical staff to assess your need for preventative services including immunizations, lifestyle education, counseling to decrease risk of preventable diseases and screening for fall risk and other medical concerns.    This visit is provided free of charge (no copay) for all Medicare recipients. The clinical pharmacists at [facility] have begun to conduct these Wellness Visits which will also include a thorough review of all your medications.    As you primary medical provider recommend that you make an appointment for your Annual Wellness Visit if you have not done so already this year.  You may set up this appointment before you leave today or you may call back ([phone]) and schedule an appointment.  Please make sure when you call that you mention that you are scheduling your Annual Wellness Visit with the clinical pharmacist so that the appointment may be made for the proper length of time.     Continue current medications. Continue good therapeutic lifestyle changes which include good diet and exercise. Fall precautions discussed with patient. If an FOBT was given today- please return it to our front desk. If you are over 85 years old - you may need Prevnar 55 or the adult Pneumonia vaccine.  Flu Shots will be available at our office starting mid- September. Please call and schedule a FLU CLINIC APPOINTMENT.   We will arrange for you to have a urology appointment with [facility] in January for urinary incontinence issues We would ask you to increase the  diclofenac to twice daily for at least 10 days after eating to see if this helps the numbness in her feet and her gait instability If the medication increase does not help this, you should get an MRI of your LS spine. You should call us back and let us know to go on and arrange that for you. Please call the audiologist at [phone] and make an appointment to have your hearing reevaluated. Dr. Letta Moynahan is the psychiatrist that we had mentioned to you and my nurse will give you her phone number for you to contact her for an appointment Continue to monitor blood pressures at home and avoid or reduce sodium intake

## 2014-06-25 NOTE — Addendum Note (Signed)
Addended by: Zannie Cove on: 06/25/2014 10:51 AM   Modules accepted: Orders

## 2014-06-25 NOTE — Progress Notes (Signed)
Subjective:    Patient ID: Lynn [name], female    DOB: 12/03/1945, 68 y.o.   MRN: [id number]  HPI Pt here for follow up and management of chronic medical problems. The patient does complain of numbness and paresthesias in her feet and generalized stiffness in her bones and also a somewhat unsteady gait. She does bring in home blood pressures for review and these will be scanned into the record and they are all good.         Patient Active Problem List   Diagnosis Date Noted  . Osteoporosis 06/24/2013  . Hypertension 05/11/2013  . Osteoarthritis 05/11/2013  . Vitamin D deficiency 05/11/2013  . Personal history of colonic polyps 05/11/2013  . Hyperlipidemia 05/11/2013   Outpatient Encounter Prescriptions as of 06/25/2014  Medication Sig  . aspirin 81 MG tablet Take 81 mg by mouth daily.  . calcium carbonate (OS-CAL) 600 MG TABS tablet Take 600 mg by mouth daily with breakfast.   . cholecalciferol (VITAMIN D) 1000 UNITS tablet Take 1,000 Units by mouth daily.  . diclofenac (VOLTAREN) 75 MG EC tablet Take 1 tablet (75 mg total) by mouth 2 (two) times daily.  . hydrochlorothiazide (MICROZIDE) 12.5 MG capsule Take 1 capsule (12.5 mg total) by mouth daily.  Marland Kitchen losartan (COZAAR) 50 MG tablet Take 1 tablet (50 mg total) by mouth 2 (two) times daily. (Patient taking differently: Take 50 mg by mouth daily. )  . raloxifene (EVISTA) 60 MG tablet Take 1 tablet (60 mg total) by mouth daily.    Review of Systems  Constitutional: Negative.   HENT: Negative.   Eyes: Negative.   Respiratory: Negative.   Cardiovascular: Negative.   Gastrointestinal: Negative.   Endocrine: Negative.   Genitourinary: Negative.   Musculoskeletal: Negative.        Stiffness  Skin: Negative.   Allergic/Immunologic: Negative.   Neurological: Negative.        Numbness in feet Unsteady gait  Hematological: Negative.   Psychiatric/Behavioral: Negative.        Objective:   Physical Exam    Constitutional: She is oriented to person, place, and time. She appears well-developed and well-nourished.  The patient was alert, cooperative and pleasant and slightly hesitant and responding to my questions about her symptoms.  HENT:  Head: Normocephalic and atraumatic.  Right Ear: External ear normal.  Left Ear: External ear normal.  Nose: Nose normal.  Mouth/Throat: Oropharynx is clear and moist. No oropharyngeal exudate.  Eyes: Conjunctivae and EOM are normal. Pupils are equal, round, and reactive to light. Right eye exhibits no discharge. Left eye exhibits no discharge. No scleral icterus.  Neck: Normal range of motion. Neck supple. No thyromegaly present.  No carotid bruits or anterior cervical adenopathy  Cardiovascular: Normal rate, regular rhythm, normal heart sounds and intact distal pulses.  Exam reveals no gallop and no friction rub.   No murmur heard. The heart was regular at 72/m  Pulmonary/Chest: Effort normal and breath sounds normal. No respiratory distress. She has no wheezes. She has no rales. She exhibits no tenderness.  Abdominal: Soft. Bowel sounds are normal. She exhibits no mass. There is tenderness. There is no rebound and no guarding.  There was slight epigastric tenderness and slight suprapubic pressure sensation with palpation  Musculoskeletal: Normal range of motion. She exhibits no edema.  Leg raising and hip abduction bilaterally were good.  Lymphadenopathy:    She has no cervical adenopathy.  Neurological: She is alert and oriented to person, place, and  time. No cranial nerve deficit.  The right lower extremity reflex was diminished compared to the left  Skin: Skin is warm and dry. No rash noted.  Psychiatric: She has a normal mood and affect. Her behavior is normal. Judgment and thought content normal.  Somewhat depressed and flat affect  Nursing note and vitals reviewed.  BP 123/83 mmHg  Pulse 79  Temp(Src) 98 F (36.7 C) (Oral)  Ht _0  (1.575 m)   Wt 129 lb (58.514 kg)  BMI 23.59 kg/m2        Assessment & Plan:  1. Hyperlipidemia - POCT CBC; Future - NMR, lipoprofile; Future  2. Essential hypertension - POCT CBC; Future - BMP8+EGFR; Future - Hepatic function panel; Future  3. Vitamin D deficiency - POCT CBC; Future - Vit D  25 hydroxy (rtn osteoporosis monitoring); Future  4. Bilateral leg weakness - MR Lumbar Spine Wo Contrast; Future  5. Paresthesia of both feet - MR Lumbar Spine Wo Contrast; Future  6. Unsteady gait - MR Lumbar Spine Wo Contrast; Future  7. Depression -The patient was given the phone number for Dr. Letta Moynahan and she is contemplating calling her for an appointment  Patient Instructions                       Medicare Annual Wellness Visit  [facility] and the medical providers at [facility] strive to bring you the best medical care.  In doing so we not only want to address your current medical conditions and concerns but also to detect new conditions early and prevent illness, disease and health-related problems.    Medicare offers a yearly Wellness Visit which allows our clinical staff to assess your need for preventative services including immunizations, lifestyle education, counseling to decrease risk of preventable diseases and screening for fall risk and other medical concerns.    This visit is provided free of charge (no copay) for all Medicare recipients. The clinical pharmacists at [facility] have begun to conduct these Wellness Visits which will also include a thorough review of all your medications.    As you primary medical provider recommend that you make an appointment for your Annual Wellness Visit if you have not done so already this year.  You may set up this appointment before you leave today or you may call back ([phone]) and schedule an appointment.  Please make sure when you call that you mention that you are scheduling  your Annual Wellness Visit with the clinical pharmacist so that the appointment may be made for the proper length of time.     Continue current medications. Continue good therapeutic lifestyle changes which include good diet and exercise. Fall precautions discussed with patient. If an FOBT was given today- please return it to our front desk. If you are over 64 years old - you may need Prevnar 24 or the adult Pneumonia vaccine.  Flu Shots will be available at our office starting mid- September. Please call and schedule a FLU CLINIC APPOINTMENT.   We will arrange for you to have a urology appointment with [facility] in January for urinary incontinence issues We would ask you to increase the diclofenac to twice daily for at least 10 days after eating to see if this helps the numbness in her feet and her gait instability If the medication increase does not help this, you should get an MRI of your LS spine. You should call us back and  let us know to go on and arrange that for you. Please call the audiologist at [phone] and make an appointment to have your hearing reevaluated. Dr. Letta Moynahan is the psychiatrist that we had mentioned to you and my nurse will give you her phone number for you to contact her for an appointment   Arrie Senate MD

## 2014-06-26 LAB — URINE CULTURE

## 2014-06-30 ENCOUNTER — Other Ambulatory Visit (INDEPENDENT_AMBULATORY_CARE_PROVIDER_SITE_OTHER): Payer: Medicare Other

## 2014-06-30 DIAGNOSIS — E559 Vitamin D deficiency, unspecified: Secondary | ICD-10-CM

## 2014-06-30 DIAGNOSIS — E785 Hyperlipidemia, unspecified: Secondary | ICD-10-CM

## 2014-06-30 DIAGNOSIS — I10 Essential (primary) hypertension: Secondary | ICD-10-CM

## 2014-06-30 LAB — POCT CBC
Granulocyte percent: 70.8 %G (ref 37–80)
HCT, POC: 44.4 % (ref 37.7–47.9)
Hemoglobin: 14.2 g/dL (ref 12.2–16.2)
Lymph, poc: 2 (ref 0.6–3.4)
MCH: 31.1 pg (ref 27–31.2)
MCHC: 32.1 g/dL (ref 31.8–35.4)
MCV: 96.9 fL (ref 80–97)
MPV: 8 fL (ref 0–99.8)
PLATELET COUNT, POC: 287 10*3/uL (ref 142–424)
POC Granulocyte: 5.9 (ref 2–6.9)
POC LYMPH %: 24.1 % (ref 10–50)
RBC: 4.6 M/uL (ref 4.04–5.48)
RDW, POC: 13.5 %
WBC: 8.3 10*3/uL (ref 4.6–10.2)

## 2014-06-30 NOTE — Progress Notes (Signed)
Lab only 

## 2014-07-01 LAB — NMR, LIPOPROFILE
Cholesterol: 211 mg/dL — ABNORMAL HIGH (ref 100–199)
HDL Cholesterol by NMR: 70 mg/dL
HDL Particle Number: 35.1 umol/L
LDL Particle Number: 1194 nmol/L — ABNORMAL HIGH
LDL Size: 21.1 nm
LDL-C: 121 mg/dL — ABNORMAL HIGH (ref 0–99)
LP-IR Score: <25
Small LDL Particle Number: 226 nmol/L
Triglycerides by NMR: 98 mg/dL (ref 0–149)

## 2014-07-01 LAB — BMP8+EGFR
BUN/Creatinine Ratio: 28 — ABNORMAL HIGH (ref 11–26)
BUN: 21 mg/dL (ref 8–27)
CO2: 25 mmol/L (ref 18–29)
Calcium: 9.7 mg/dL (ref 8.7–10.3)
Chloride: 101 mmol/L (ref 97–108)
Creatinine, Ser: 0.74 mg/dL (ref 0.57–1.00)
GFR calc Af Amer: 96 mL/min/{1.73_m2}
GFR calc non Af Amer: 84 mL/min/{1.73_m2}
Glucose: 82 mg/dL (ref 65–99)
Potassium: 3.8 mmol/L (ref 3.5–5.2)
Sodium: 142 mmol/L (ref 134–144)

## 2014-07-01 LAB — HEPATIC FUNCTION PANEL
ALT: 21 IU/L (ref 0–32)
AST: 20 IU/L (ref 0–40)
Albumin: 4.3 g/dL (ref 3.6–4.8)
Alkaline Phosphatase: 61 IU/L (ref 39–117)
Bilirubin, Direct: 0.16 mg/dL (ref 0.00–0.40)
Total Bilirubin: 0.5 mg/dL (ref 0.0–1.2)
Total Protein: 6.4 g/dL (ref 6.0–8.5)

## 2014-07-01 LAB — VITAMIN D 25 HYDROXY (VIT D DEFICIENCY, FRACTURES): Vit D, 25-Hydroxy: 38.4 ng/mL (ref 30.0–100.0)

## 2014-07-02 ENCOUNTER — Telehealth: Payer: Self-pay

## 2014-07-02 NOTE — Telephone Encounter (Signed)
Left results on cell vm as per DPR of 12/31/2012

## 2014-07-02 NOTE — Telephone Encounter (Signed)
-----   Message from Chipper Herb, MD sent at 07/01/2014  9:46 PM EST ----- The blood sugar is good at 82. The creatinine, the most important kidney function test is within normal limits. The electrolytes including potassium are within normal limits. All liver function tests are within normal limits Cholesterol numbers with advanced lipid testing remain elevated. The total LDL particle number number is 1194. Previously was 1074. This number should be less than 1000. The LDL C is elevated at 121 and should be less than 100. The triglycerides are good. Please try to do better with aggressive therapeutic lifestyle changes which include diet and exercise. If the total LDL particle number continues to rise and the LDL C continues to rise she should definitely consider a statin drug low dose. The vitamin D level is good at 38.4. Continue current treatment

## 2014-07-03 ENCOUNTER — Other Ambulatory Visit: Payer: Medicare Other

## 2014-08-09 ENCOUNTER — Telehealth: Payer: Self-pay | Admitting: Family Medicine

## 2014-08-09 NOTE — Telephone Encounter (Signed)
Please review and [phone]

## 2014-08-09 NOTE — Telephone Encounter (Signed)
Lynn [name] review this and let me know what your suggestions are

## 2014-08-11 NOTE — Telephone Encounter (Signed)
Per [facility] copay has increased from $6 per month to $40 for generic raloxifene.   Alternative would include oral bisphosphonate which might be less expensive.  Patient received copy of 2016 formulary.  She is going to check to see if there are alternatives listed and get back to me or she will bring in 2016 formulary so that I can check for alternative therapy.  She currently has 1 month supple of raloxifene.

## 2014-08-24 ENCOUNTER — Other Ambulatory Visit: Payer: Self-pay | Admitting: Family Medicine

## 2014-08-24 NOTE — Telephone Encounter (Signed)
Stp and she states her RX for voltaren and losartan are expiring 09/16/14 and she needed authorization from [place] so she can continue getting her refills. I called the pharmacy and they said she had multiple refills on both medications and multiple refills. Will close encounter.

## 2014-11-02 ENCOUNTER — Encounter: Payer: Self-pay | Admitting: Family Medicine

## 2014-11-02 ENCOUNTER — Ambulatory Visit (INDEPENDENT_AMBULATORY_CARE_PROVIDER_SITE_OTHER): Payer: Medicare Other | Admitting: Family Medicine

## 2014-11-02 VITALS — BP 131/85 | HR 80 | Temp 97.1°F | Ht 62.0 in | Wt 128.0 lb

## 2014-11-02 DIAGNOSIS — E559 Vitamin D deficiency, unspecified: Secondary | ICD-10-CM | POA: Diagnosis not present

## 2014-11-02 DIAGNOSIS — F329 Major depressive disorder, single episode, unspecified: Secondary | ICD-10-CM | POA: Diagnosis not present

## 2014-11-02 DIAGNOSIS — R29898 Other symptoms and signs involving the musculoskeletal system: Secondary | ICD-10-CM

## 2014-11-02 DIAGNOSIS — R2689 Other abnormalities of gait and mobility: Secondary | ICD-10-CM

## 2014-11-02 DIAGNOSIS — J301 Allergic rhinitis due to pollen: Secondary | ICD-10-CM | POA: Diagnosis not present

## 2014-11-02 DIAGNOSIS — F32A Depression, unspecified: Secondary | ICD-10-CM

## 2014-11-02 DIAGNOSIS — I10 Essential (primary) hypertension: Secondary | ICD-10-CM

## 2014-11-02 DIAGNOSIS — E785 Hyperlipidemia, unspecified: Secondary | ICD-10-CM | POA: Diagnosis not present

## 2014-11-02 DIAGNOSIS — F320 Major depressive disorder, single episode, mild: Secondary | ICD-10-CM

## 2014-11-02 DIAGNOSIS — R29818 Other symptoms and signs involving the nervous system: Secondary | ICD-10-CM | POA: Diagnosis not present

## 2014-11-02 MED ORDER — LOSARTAN POTASSIUM 50 MG PO TABS: 50.0000 mg | ORAL_TABLET | Freq: Every day | ORAL | Status: DC

## 2014-11-02 MED ORDER — FLUTICASONE PROPIONATE 50 MCG/ACT NA SUSP: NASAL | Status: DC

## 2014-11-02 NOTE — Addendum Note (Signed)
Addended by: Zannie Cove on: 11/02/2014 10:41 AM   Modules accepted: Orders

## 2014-11-02 NOTE — Patient Instructions (Addendum)
Medicare Annual Wellness Visit  [facility] and the medical providers at [facility] strive to bring you the best medical care.  In doing so we not only want to address your current medical conditions and concerns but also to detect new conditions early and prevent illness, disease and health-related problems.    Medicare offers a yearly Wellness Visit which allows our clinical staff to assess your need for preventative services including immunizations, lifestyle education, counseling to decrease risk of preventable diseases and screening for fall risk and other medical concerns.    This visit is provided free of charge (no copay) for all Medicare recipients. The clinical pharmacists at [facility] have begun to conduct these Wellness Visits which will also include a thorough review of all your medications.    As you primary medical provider recommend that you make an appointment for your Annual Wellness Visit if you have not done so already this year.  You may set up this appointment before you leave today or you may call back ([phone]) and schedule an appointment.  Please make sure when you call that you mention that you are scheduling your Annual Wellness Visit with the clinical pharmacist so that the appointment may be made for the proper length of time.     Continue current medications. Continue good therapeutic lifestyle changes which include good diet and exercise. Fall precautions discussed with patient. If an FOBT was given today- please return it to our front desk. If you are over 58 years old - you may need Prevnar 59 or the adult Pneumonia vaccine.  Flu Shots are still available at our office. If you still haven't had one please call to set up a nurse visit to get one.   After your visit with [place] today you will receive a survey in the mail or online from [employer] regarding your care with [place]. Please take a moment to  fill this out. Your feedback is very important to [place] as you can help [place] better understand your patient needs as well as improve your experience and satisfaction. WE CARE ABOUT YOU!!!   We will arrange for the patient to have an MRI of the lumbosacral spine because of her heaviness feeling in her legs and her balance issues. She has a history of severe osteoarthritis and her lumbosacral spine. We will also ask her to try melatonin 3 mg over-the-counter one at bedtime as needed for sleep We would ask her to try Flonase 1-2 sprays each nostril nightly for allergic rhinitis in addition to using nasal saline frequently during the day She needs to continue to drink plenty of fluids.

## 2014-11-02 NOTE — Progress Notes (Addendum)
Subjective:    Patient ID: Lynn [name], female    DOB: 1946-04-19, 69 y.o.   MRN: [id number]  HPI Pt here for follow up and management of chronic medical problems which includes hyperlipidemia and hypertension. She is taking medications regularly. The patient has several complaints today she is complaining of some balance issues and that her legs feel heavy at times. She is also having trouble falling asleep. She does also complain of some changes in her voice. The patient denies chest pain or shortness of breath or GI symptoms. She has seen the psychiatrist's and says that the medicine she was started on that she have problems with this. She has further follow-ups planned.         Patient Active Problem List   Diagnosis Date Noted  . Incontinence in female 06/25/2014  . Depression 06/25/2014  . Hearing deficit 06/25/2014  . Osteoporosis 06/24/2013  . Hypertension 05/11/2013  . Osteoarthritis 05/11/2013  . Vitamin D deficiency 05/11/2013  . Personal history of colonic polyps 05/11/2013  . Hyperlipidemia 05/11/2013   Outpatient Encounter Prescriptions as of 11/02/2014  Medication Sig  . aspirin 81 MG tablet Take 81 mg by mouth daily.  . calcium carbonate (OS-CAL) 600 MG TABS tablet Take 600 mg by mouth daily with breakfast.   . cholecalciferol (VITAMIN D) 1000 UNITS tablet Take 1,000 Units by mouth daily.  . diclofenac (VOLTAREN) 75 MG EC tablet Take 1 tablet (75 mg total) by mouth 2 (two) times daily.  . hydrochlorothiazide (MICROZIDE) 12.5 MG capsule Take 1 capsule (12.5 mg total) by mouth daily.  Marland Kitchen losartan (COZAAR) 50 MG tablet Take 1 tablet (50 mg total) by mouth 2 (two) times daily. (Patient taking differently: Take 50 mg by mouth daily. )  . raloxifene (EVISTA) 60 MG tablet Take 1 tablet (60 mg total) by mouth daily.    Review of Systems  Constitutional: Negative.        Trouble "falling" asleep   HENT: Positive for voice change.   Eyes: Negative.     Respiratory: Negative.   Cardiovascular: Negative.   Gastrointestinal: Negative.   Endocrine: Negative.   Genitourinary: Negative.   Musculoskeletal: Negative.        Legs feel "heavy"  Skin: Negative.   Allergic/Immunologic: Negative.   Neurological: Negative.        Off balance  Hematological: Negative.   Psychiatric/Behavioral: Negative.        Objective:   Physical Exam  Constitutional: She is oriented to person, place, and time. She appears well-developed and well-nourished. No distress.  HENT:  Head: Normocephalic and atraumatic.  Right Ear: External ear normal.  Left Ear: External ear normal.  Mouth/Throat: Oropharynx is clear and moist.  Nasal congestion bilaterally  Eyes: Conjunctivae and EOM are normal. Pupils are equal, round, and reactive to light. Right eye exhibits no discharge. Left eye exhibits no discharge. No scleral icterus.  Neck: Normal range of motion. Neck supple. No thyromegaly present.  No bruits or anterior cervical adenopathy  Cardiovascular: Normal rate, regular rhythm, normal heart sounds and intact distal pulses.  Exam reveals no friction rub.   No murmur heard. At 72/m  Pulmonary/Chest: Effort normal and breath sounds normal. No respiratory distress. She has no wheezes. She has no rales. She exhibits no tenderness.  Clear anteriorly and posteriorly  Abdominal: Soft. Bowel sounds are normal. She exhibits no mass. There is tenderness. There is no rebound and no guarding.  Generalized abdominal tenderness and suprapubic tenderness without masses  or organ enlargement  Musculoskeletal: Normal range of motion. She exhibits no edema or tenderness.  Lymphadenopathy:    She has no cervical adenopathy.  Neurological: She is alert and oriented to person, place, and time. She has normal reflexes. No cranial nerve deficit.  Skin: Skin is warm and dry. No rash noted.  Psychiatric: She has a normal mood and affect. Her behavior is normal. Judgment and thought  content normal.  Somewhat flat and depressed affect  Nursing note and vitals reviewed.  BP 131/85 mmHg  Pulse 80  Temp(Src) 97.1 F (36.2 C) (Oral)  Ht 5' 2"  (1.575 m)  Wt 128 lb (58.06 kg)  BMI 23.41 kg/m2        Assessment & Plan:  1. Hyperlipidemia -The patient will return to the clinic for fasting blood work and any recommendations regarding her cholesterol will be determined then - POCT CBC; Future - NMR, lipoprofile; Future  2. Essential hypertension -The blood pressure is good today but we would ask her to bring additional readings back for future visits. She was given a sheet to record these readings on. She should continue to take her current medicine and should watch her sodium intake as closely as possible - POCT CBC; Future - BMP8+EGFR; Future - Hepatic function panel; Future  3. Vitamin D deficiency -She should continue to take her vitamin D replacement and any changes will be made following results of lab work - POCT CBC; Future - Vit D  25 hydroxy (rtn osteoporosis monitoring); Future  4. Allergic rhinitis due to pollen -In addition to the nose spray we would ask her to use saline nose spray during the day - fluticasone (FLONASE) 50 MCG/ACT nasal spray; One to 2 sprays each nostril at bedtime for allergic rhinitis  Dispense: 16 g; Refill: 6  5. Balance problems -MRI of lumbosacral spine is planned  6. Leg heaviness -MRI of lumbosacral spine as planned  7. Mild depression -Continue to follow-up with Dr. Letta Moynahan for depression   Meds ordered this encounter  Medications  . losartan (COZAAR) 50 MG tablet    Sig: Take 1 tablet (50 mg total) by mouth daily.    Dispense:  30 tablet    Refill:  11  . fluticasone (FLONASE) 50 MCG/ACT nasal spray    Sig: One to 2 sprays each nostril at bedtime for allergic rhinitis    Dispense:  16 g    Refill:  6     Patient Instructions                       Medicare Annual Wellness Visit  [facility] and  the medical providers at [facility] strive to bring you the best medical care.  In doing so we not only want to address your current medical conditions and concerns but also to detect new conditions early and prevent illness, disease and health-related problems.    Medicare offers a yearly Wellness Visit which allows our clinical staff to assess your need for preventative services including immunizations, lifestyle education, counseling to decrease risk of preventable diseases and screening for fall risk and other medical concerns.    This visit is provided free of charge (no copay) for all Medicare recipients. The clinical pharmacists at [facility] have begun to conduct these Wellness Visits which will also include a thorough review of all your medications.    As you primary medical provider recommend that you make an appointment for  your Annual Wellness Visit if you have not done so already this year.  You may set up this appointment before you leave today or you may call back ([phone]) and schedule an appointment.  Please make sure when you call that you mention that you are scheduling your Annual Wellness Visit with the clinical pharmacist so that the appointment may be made for the proper length of time.     Continue current medications. Continue good therapeutic lifestyle changes which include good diet and exercise. Fall precautions discussed with patient. If an FOBT was given today- please return it to our front desk. If you are over 73 years old - you may need Prevnar 75 or the adult Pneumonia vaccine.  Flu Shots are still available at our office. If you still haven't had one please call to set up a nurse visit to get one.   After your visit with [place] today you will receive a survey in the mail or online from [employer] regarding your care with [place]. Please take a moment to fill this out. Your feedback is very important to [place] as you can help [place]  better understand your patient needs as well as improve your experience and satisfaction. WE CARE ABOUT YOU!!!   We will arrange for the patient to have an MRI of the lumbosacral spine because of her heaviness feeling in her legs and her balance issues. She has a history of severe osteoarthritis and her lumbosacral spine. We will also ask her to try melatonin 3 mg over-the-counter one at bedtime as needed for sleep We would ask her to try Flonase 1-2 sprays each nostril nightly for allergic rhinitis in addition to using nasal saline frequently during the day She needs to continue to drink plenty of fluids.   Arrie Senate MD

## 2014-11-03 ENCOUNTER — Other Ambulatory Visit (INDEPENDENT_AMBULATORY_CARE_PROVIDER_SITE_OTHER): Payer: Medicare Other

## 2014-11-03 ENCOUNTER — Other Ambulatory Visit: Payer: Medicare Other

## 2014-11-03 DIAGNOSIS — E559 Vitamin D deficiency, unspecified: Secondary | ICD-10-CM | POA: Diagnosis not present

## 2014-11-03 DIAGNOSIS — Z1212 Encounter for screening for malignant neoplasm of rectum: Secondary | ICD-10-CM

## 2014-11-03 DIAGNOSIS — I10 Essential (primary) hypertension: Secondary | ICD-10-CM | POA: Diagnosis not present

## 2014-11-03 DIAGNOSIS — E785 Hyperlipidemia, unspecified: Secondary | ICD-10-CM | POA: Diagnosis not present

## 2014-11-03 LAB — POCT CBC
Granulocyte percent: 68.3 %G (ref 37–80)
HCT, POC: 45.2 % (ref 37.7–47.9)
Hemoglobin: 14.3 g/dL (ref 12.2–16.2)
LYMPH, POC: 2.4 (ref 0.6–3.4)
MCH, POC: 30.4 pg (ref 27–31.2)
MCHC: 31.7 g/dL — AB (ref 31.8–35.4)
MCV: 95.9 fL (ref 80–97)
MPV: 7.7 fL (ref 0–99.8)
PLATELET COUNT, POC: 314 10*3/uL (ref 142–424)
POC Granulocyte: 6.6 (ref 2–6.9)
POC LYMPH PERCENT: 25 %L (ref 10–50)
RBC: 4.71 M/uL (ref 4.04–5.48)
RDW, POC: 13.5 %
WBC: 9.6 10*3/uL (ref 4.6–10.2)

## 2014-11-03 NOTE — Progress Notes (Signed)
Lab only 

## 2014-11-04 LAB — NMR, LIPOPROFILE
Cholesterol: 212 mg/dL — ABNORMAL HIGH (ref 100–199)
HDL CHOLESTEROL BY NMR: 71 mg/dL (ref >39)
HDL PARTICLE NUMBER: 35.8 umol/L (ref >=30.5)
LDL Particle Number: 1355 nmol/L — ABNORMAL HIGH (ref <1000)
LDL Size: 21.2 nm (ref >20.5)
LDL-C: 121 mg/dL — AB (ref 0–99)
Small LDL Particle Number: 393 nmol/L (ref <=527)
Triglycerides by NMR: 99 mg/dL (ref 0–149)

## 2014-11-04 LAB — BMP8+EGFR
BUN/Creatinine Ratio: 28 — ABNORMAL HIGH (ref 11–26)
BUN: 23 mg/dL (ref 8–27)
CO2: 26 mmol/L (ref 18–29)
Calcium: 10 mg/dL (ref 8.7–10.3)
Chloride: 102 mmol/L (ref 97–108)
Creatinine, Ser: 0.82 mg/dL (ref 0.57–1.00)
GFR calc Af Amer: 84 mL/min/{1.73_m2} (ref >59)
GFR calc non Af Amer: 73 mL/min/{1.73_m2} (ref >59)
Glucose: 82 mg/dL (ref 65–99)
Potassium: 4.7 mmol/L (ref 3.5–5.2)
SODIUM: 143 mmol/L (ref 134–144)

## 2014-11-04 LAB — HEPATIC FUNCTION PANEL
ALT: 20 IU/L (ref 0–32)
AST: 18 IU/L (ref 0–40)
Albumin: 4.3 g/dL (ref 3.6–4.8)
Alkaline Phosphatase: 66 IU/L (ref 39–117)
Bilirubin Total: 0.5 mg/dL (ref 0.0–1.2)
Bilirubin, Direct: 0.14 mg/dL (ref 0.00–0.40)
Total Protein: 6.5 g/dL (ref 6.0–8.5)

## 2014-11-04 LAB — VITAMIN D 25 HYDROXY (VIT D DEFICIENCY, FRACTURES): VIT D 25 HYDROXY: 36.7 ng/mL (ref 30.0–100.0)

## 2014-11-05 ENCOUNTER — Other Ambulatory Visit: Payer: Self-pay | Admitting: Family Medicine

## 2014-11-05 LAB — FECAL OCCULT BLOOD, IMMUNOCHEMICAL: Fecal Occult Bld: NEGATIVE

## 2014-11-26 ENCOUNTER — Ambulatory Visit
Admission: RE | Admit: 2014-11-26 | Discharge: 2014-11-26 | Disposition: A | Discharge status: Home / Self Care | Payer: Medicare Other | Source: Ambulatory Visit | Attending: Family Medicine | Admitting: Family Medicine

## 2014-11-26 DIAGNOSIS — R29898 Other symptoms and signs involving the musculoskeletal system: Secondary | ICD-10-CM

## 2014-11-26 DIAGNOSIS — R2689 Other abnormalities of gait and mobility: Secondary | ICD-10-CM

## 2014-11-30 ENCOUNTER — Ambulatory Visit: Payer: Medicare Other | Admitting: Family Medicine

## 2014-12-03 ENCOUNTER — Ambulatory Visit: Payer: Medicare Other | Admitting: Family Medicine

## 2014-12-14 ENCOUNTER — Telehealth: Payer: Self-pay | Admitting: Family Medicine

## 2014-12-14 DIAGNOSIS — M48061 Spinal stenosis, lumbar region without neurogenic claudication: Secondary | ICD-10-CM

## 2014-12-14 DIAGNOSIS — M4306 Spondylolysis, lumbar region: Secondary | ICD-10-CM

## 2014-12-14 NOTE — Telephone Encounter (Signed)
Patient states that she has not heard from the neurologist. They need the neurologist office to contact her to schedule since there are a lot of days that she is not available

## 2014-12-23 ENCOUNTER — Telehealth: Payer: Self-pay | Admitting: Family Medicine

## 2014-12-23 NOTE — Telephone Encounter (Signed)
pt wanted to ck on referral --- per referral it is their possession for review.

## 2014-12-30 ENCOUNTER — Other Ambulatory Visit: Payer: Self-pay

## 2014-12-30 DIAGNOSIS — Z1231 Encounter for screening mammogram for malignant neoplasm of breast: Secondary | ICD-10-CM

## 2015-01-04 ENCOUNTER — Other Ambulatory Visit: Payer: Self-pay | Admitting: Family Medicine

## 2015-02-03 ENCOUNTER — Ambulatory Visit
Admission: RE | Admit: 2015-02-03 | Discharge: 2015-02-03 | Disposition: A | Discharge status: Home / Self Care | Payer: Medicare Other | Source: Ambulatory Visit

## 2015-02-03 DIAGNOSIS — Z1231 Encounter for screening mammogram for malignant neoplasm of breast: Secondary | ICD-10-CM

## 2015-03-22 ENCOUNTER — Ambulatory Visit (INDEPENDENT_AMBULATORY_CARE_PROVIDER_SITE_OTHER): Payer: Medicare Other | Admitting: Family Medicine

## 2015-03-22 ENCOUNTER — Encounter: Payer: Self-pay | Admitting: Family Medicine

## 2015-03-22 VITALS — BP 129/92 | HR 77 | Temp 97.1°F | Ht 62.0 in | Wt 129.0 lb

## 2015-03-22 DIAGNOSIS — R29818 Other symptoms and signs involving the nervous system: Secondary | ICD-10-CM

## 2015-03-22 DIAGNOSIS — E785 Hyperlipidemia, unspecified: Secondary | ICD-10-CM

## 2015-03-22 DIAGNOSIS — R2689 Other abnormalities of gait and mobility: Secondary | ICD-10-CM

## 2015-03-22 DIAGNOSIS — E559 Vitamin D deficiency, unspecified: Secondary | ICD-10-CM | POA: Diagnosis not present

## 2015-03-22 DIAGNOSIS — I10 Essential (primary) hypertension: Secondary | ICD-10-CM | POA: Diagnosis not present

## 2015-03-22 DIAGNOSIS — M150 Primary generalized (osteo)arthritis: Secondary | ICD-10-CM

## 2015-03-22 DIAGNOSIS — M159 Polyosteoarthritis, unspecified: Secondary | ICD-10-CM

## 2015-03-22 MED ORDER — LOSARTAN POTASSIUM 50 MG PO TABS: 50.0000 mg | ORAL_TABLET | Freq: Every day | ORAL | Status: DC

## 2015-03-22 MED ORDER — DICLOFENAC SODIUM 75 MG PO TBEC: 75.0000 mg | DELAYED_RELEASE_TABLET | Freq: Two times a day (BID) | ORAL | Status: DC

## 2015-03-22 NOTE — Patient Instructions (Addendum)
Medicare Annual Wellness Visit  [facility] and the medical providers at [facility] strive to bring you the best medical care.  In doing so we not only want to address your current medical conditions and concerns but also to detect new conditions early and prevent illness, disease and health-related problems.    Medicare offers a yearly Wellness Visit which allows our clinical staff to assess your need for preventative services including immunizations, lifestyle education, counseling to decrease risk of preventable diseases and screening for fall risk and other medical concerns.    This visit is provided free of charge (no copay) for all Medicare recipients. The clinical pharmacists at [facility] have begun to conduct these Wellness Visits which will also include a thorough review of all your medications.    As you primary medical provider recommend that you make an appointment for your Annual Wellness Visit if you have not done so already this year.  You may set up this appointment before you leave today or you may call back ([phone]) and schedule an appointment.  Please make sure when you call that you mention that you are scheduling your Annual Wellness Visit with the clinical pharmacist so that the appointment may be made for the proper length of time.      Continue current medications. Continue good therapeutic lifestyle changes which include good diet and exercise. Fall precautions discussed with patient. If an FOBT was given today- please return it to our front desk. If you are over 45 years old - you may need Prevnar 35 or the adult Pneumonia vaccine.  **Flu shots will be available soon--- please call and schedule a FLU-CLINIC appointment**  After your visit with [place] today you will receive a survey in the mail or online from [employer] regarding your care with [place]. Please take a moment to fill this out. Your feedback is  very important to [place] as you can help [place] better understand your patient needs as well as improve your experience and satisfaction. WE CARE ABOUT YOU!!!   **Please join [place] SEPT.[email or web address], 2016 from 5:00 to 7:00pm for our OPEN HOUSE! Come out and meet our NEW providers**  Continue current medications Continue follow-up with neurosurgeon because of neck and back Continue to monitor blood pressures at home and bring readings to each visit Watch sodium intake Stay as active physically as possible

## 2015-03-22 NOTE — Progress Notes (Signed)
Subjective:    Patient ID: Lynn [name], female    DOB: [birth year], 69 y.o.   MRN: [id number]  HPI Pt here for follow up and management of chronic medical problems which includes hypertension and hyperlipidemia. She is taking medications regularly. She does complain of some dizziness in the mornings. She brings in blood pressures for review and they will be scanned scanned into the record. She has seen the neurosurgeon and has plans to follow-up with him in a couple weeks for injection in her back. She still complains of a wobbly feeling in her lower extremities. The patient denies chest pain. She may have some shortness of breath with climbing hills. She is swallowing okay and has no heartburn indigestion nausea vomiting or diarrhea or blood in the stool. She is passing her water without problems. She is also having some problems with her vision and feels like her glasses have not been adjusted properly but does see the ophthalmologist for this.      Patient Active Problem List   Diagnosis Date Noted  . Incontinence in female 06/25/2014  . Depression 06/25/2014  . Hearing deficit 06/25/2014  . Osteoporosis 06/24/2013  . Hypertension 05/11/2013  . Osteoarthritis 05/11/2013  . Vitamin D deficiency 05/11/2013  . Personal history of colonic polyps 05/11/2013  . Hyperlipidemia 05/11/2013   Outpatient Encounter Prescriptions as of 03/22/2015  Medication Sig  . aspirin 81 MG tablet Take 81 mg by mouth daily.  . calcium carbonate (OS-CAL) 600 MG TABS tablet Take 600 mg by mouth daily with breakfast.   . cholecalciferol (VITAMIN D) 1000 UNITS tablet Take 1,000 Units by mouth daily.  . diclofenac (VOLTAREN) 75 MG EC tablet Take 1 tablet (75 mg total) by mouth 2 (two) times daily.  . hydrochlorothiazide (MICROZIDE) 12.5 MG capsule TAKE ONE CAPSULE BY MOUTH EVERY DAY  . losartan (COZAAR) 50 MG tablet Take 1 tablet (50 mg total) by mouth daily.  . raloxifene (EVISTA) 60 MG tablet TAKE 1  TABLET (60 MG TOTAL) BY MOUTH DAILY.  . fluticasone (FLONASE) 50 MCG/ACT nasal spray One to 2 sprays each nostril at bedtime for allergic rhinitis (Patient not taking: Reported on 03/22/2015)   No facility-administered encounter medications on file as of 03/22/2015.      Review of Systems  Constitutional: Negative.   HENT: Negative.   Eyes: Negative.   Respiratory: Negative.   Cardiovascular: Negative.   Gastrointestinal: Negative.   Endocrine: Negative.   Genitourinary: Negative.   Musculoskeletal: Negative.   Skin: Negative.   Allergic/Immunologic: Negative.   Neurological: Positive for dizziness.  Hematological: Negative.   Psychiatric/Behavioral: Negative.        Objective:   Physical Exam  Constitutional: She is oriented to person, place, and time. She appears well-developed and well-nourished. No distress.  HENT:  Head: Normocephalic and atraumatic.  Right Ear: External ear normal.  Left Ear: External ear normal.  Nose: Nose normal.  Mouth/Throat: Oropharynx is clear and moist. No oropharyngeal exudate.  Eyes: Conjunctivae and EOM are normal. Pupils are equal, round, and reactive to light. Right eye exhibits no discharge. Left eye exhibits no discharge. No scleral icterus.  Neck: Normal range of motion. Neck supple. No thyromegaly present.  No carotid bruits thyromegaly or anterior cervical adenopathy  Cardiovascular: Normal rate, regular rhythm, normal heart sounds and intact distal pulses.  Exam reveals no gallop and no friction rub.   No murmur heard. The heart is regular at 72/m  Pulmonary/Chest: Effort normal and breath sounds normal.  No respiratory distress. She has no wheezes. She has no rales. She exhibits no tenderness.  Lungs are clear anteriorly and posteriorly  Abdominal: Soft. Bowel sounds are normal. She exhibits no mass. There is no tenderness. There is no rebound and no guarding.  There was no abdominal tenderness or inguinal adenopathy    Musculoskeletal: Normal range of motion. She exhibits no edema or tenderness.  Lymphadenopathy:    She has no cervical adenopathy.  Neurological: She is alert and oriented to person, place, and time. She has normal reflexes. No cranial nerve deficit.  Skin: Skin is warm and dry. No rash noted.  Psychiatric: She has a normal mood and affect. Her behavior is normal. Judgment and thought content normal.  Nursing note and vitals reviewed.  BP 129/92 mmHg  Pulse 77  Temp(Src) 97.1 F (36.2 C) (Oral)  Ht _0  (1.575 m)  Wt 129 lb (58.514 kg)  BMI 23.59 kg/m2        Assessment & Plan:  1. Vitamin D deficiency -The patient will continue with current vitamin D replacement pending results of lab work - CBC with Differential/Platelet - Vit D  25 hydroxy (rtn osteoporosis monitoring)  2. Essential hypertension -The blood pressures from home are under good control and this includes both morning and evening readings. The diastolic reading here was slightly elevated today but we will not change current treatment and she will stay with what she is currently taking. - BMP8+EGFR - CBC with Differential/Platelet - Hepatic function panel  3. Hyperlipidemia -Continue with diet and exercise regimen as much as possible pending results of lab work - CBC with Differential/Platelet - Lipid panel  4. Balance problems -This is most likely related to the problems in her spine as I do not believe the blood pressure is contributing to this. She will continue to follow-up with the neurosurgeon.  5. Primary osteoarthritis involving multiple joints -Continue current treatment with Voltaren pending results of lab work which would include liver function tests and creatinine and CBC  Meds ordered this encounter  Medications  . diclofenac (VOLTAREN) 75 MG EC tablet    Sig: Take 1 tablet (75 mg total) by mouth 2 (two) times daily.    Dispense:  60 tablet    Refill:  6    Please put on HOLD  . losartan  (COZAAR) 50 MG tablet    Sig: Take 1 tablet (50 mg total) by mouth daily.    Dispense:  30 tablet    Refill:  11    Please put on HOLD   Patient Instructions                       Medicare Annual Wellness Visit  [facility] and the medical providers at [facility] strive to bring you the best medical care.  In doing so we not only want to address your current medical conditions and concerns but also to detect new conditions early and prevent illness, disease and health-related problems.    Medicare offers a yearly Wellness Visit which allows our clinical staff to assess your need for preventative services including immunizations, lifestyle education, counseling to decrease risk of preventable diseases and screening for fall risk and other medical concerns.    This visit is provided free of charge (no copay) for all Medicare recipients. The clinical pharmacists at [facility] have begun to conduct these Wellness Visits which will also include a thorough review of all your  medications.    As you primary medical provider recommend that you make an appointment for your Annual Wellness Visit if you have not done so already this year.  You may set up this appointment before you leave today or you may call back [phone]) and schedule an appointment.  Please make sure when you call that you mention that you are scheduling your Annual Wellness Visit with the clinical pharmacist so that the appointment may be made for the proper length of time.      Continue current medications. Continue good therapeutic lifestyle changes which include good diet and exercise. Fall precautions discussed with patient. If an FOBT was given today- please return it to our front desk. If you are over 88 years old - you may need Prevnar 76 or the adult Pneumonia vaccine.  **Flu shots will be available soon--- please call and schedule a FLU-CLINIC appointment**  After your  visit with [place] today you will receive a survey in the mail or online from [employer] regarding your care with [place]. Please take a moment to fill this out. Your feedback is very important to [place] as you can help [place] better understand your patient needs as well as improve your experience and satisfaction. WE CARE ABOUT YOU!!!   **Please join [place] SEPT.[email or web address], 2016 from 5:00 to 7:00pm for our OPEN HOUSE! Come out and meet our NEW providers**  Continue current medications Continue follow-up with neurosurgeon because of neck and back Continue to monitor blood pressures at home and bring readings to each visit Watch sodium intake Stay as active physically as possible   Arrie Senate MD

## 2015-03-23 ENCOUNTER — Encounter: Payer: Self-pay | Admitting: *Deleted

## 2015-03-23 LAB — BMP8+EGFR
BUN / CREAT RATIO: 20 (ref 11–26)
BUN: 15 mg/dL (ref 8–27)
CHLORIDE: 101 mmol/L (ref 97–108)
CO2: 26 mmol/L (ref 18–29)
CREATININE: 0.76 mg/dL (ref 0.57–1.00)
Calcium: 9.8 mg/dL (ref 8.7–10.3)
GFR calc Af Amer: 93 mL/min/{1.73_m2} (ref >59)
GFR calc non Af Amer: 80 mL/min/{1.73_m2} (ref >59)
GLUCOSE: 71 mg/dL (ref 65–99)
POTASSIUM: 3.8 mmol/L (ref 3.5–5.2)
SODIUM: 141 mmol/L (ref 134–144)

## 2015-03-23 LAB — CBC WITH DIFFERENTIAL/PLATELET
BASOS: 0 %
Basophils Absolute: 0 10*3/uL (ref 0.0–0.2)
EOS (ABSOLUTE): 0.1 10*3/uL (ref 0.0–0.4)
EOS: 1 %
HEMATOCRIT: 43.3 % (ref 34.0–46.6)
Hemoglobin: 14.8 g/dL (ref 11.1–15.9)
IMMATURE GRANULOCYTES: 0 %
Immature Grans (Abs): 0 10*3/uL (ref 0.0–0.1)
Lymphocytes Absolute: 1 10*3/uL (ref 0.7–3.1)
Lymphs: 10 %
MCH: 33 pg (ref 26.6–33.0)
MCHC: 34.2 g/dL (ref 31.5–35.7)
MCV: 97 fL (ref 79–97)
MONOS ABS: 0.9 10*3/uL (ref 0.1–0.9)
Monocytes: 9 %
NEUTROS ABS: 8 10*3/uL — AB (ref 1.4–7.0)
NEUTROS PCT: 80 %
PLATELETS: 311 10*3/uL (ref 150–379)
RBC: 4.48 x10E6/uL (ref 3.77–5.28)
RDW: 12.8 % (ref 12.3–15.4)
WBC: 10.1 10*3/uL (ref 3.4–10.8)

## 2015-03-23 LAB — LIPID PANEL
CHOL/HDL RATIO: 3 ratio (ref 0.0–4.4)
CHOLESTEROL TOTAL: 188 mg/dL (ref 100–199)
HDL: 63 mg/dL (ref >39)
LDL CALC: 104 mg/dL — AB (ref 0–99)
TRIGLYCERIDES: 103 mg/dL (ref 0–149)
VLDL CHOLESTEROL CAL: 21 mg/dL (ref 5–40)

## 2015-03-23 LAB — HEPATIC FUNCTION PANEL
ALT: 22 [IU]/L (ref 0–32)
AST: 14 [IU]/L (ref 0–40)
Albumin: 3.9 g/dL (ref 3.6–4.8)
Alkaline Phosphatase: 70 [IU]/L (ref 39–117)
Bilirubin Total: 0.5 mg/dL (ref 0.0–1.2)
Bilirubin, Direct: 0.14 mg/dL (ref 0.00–0.40)
Total Protein: 6.2 g/dL (ref 6.0–8.5)

## 2015-03-23 LAB — VITAMIN D 25 HYDROXY (VIT D DEFICIENCY, FRACTURES): Vit D, 25-Hydroxy: 33.5 ng/mL (ref 30.0–100.0)

## 2015-05-02 ENCOUNTER — Ambulatory Visit (INDEPENDENT_AMBULATORY_CARE_PROVIDER_SITE_OTHER): Payer: Medicare Other

## 2015-05-02 DIAGNOSIS — Z23 Encounter for immunization: Secondary | ICD-10-CM | POA: Diagnosis not present

## 2015-05-02 NOTE — Addendum Note (Signed)
Addended by: Rolena Infante on: 05/02/2015 12:34 PM   Modules accepted: Orders

## 2015-05-03 ENCOUNTER — Encounter: Payer: Self-pay | Admitting: Family Medicine

## 2015-05-03 ENCOUNTER — Ambulatory Visit (INDEPENDENT_AMBULATORY_CARE_PROVIDER_SITE_OTHER): Payer: Medicare Other

## 2015-05-03 ENCOUNTER — Other Ambulatory Visit: Payer: Self-pay | Admitting: Family Medicine

## 2015-05-03 ENCOUNTER — Ambulatory Visit (INDEPENDENT_AMBULATORY_CARE_PROVIDER_SITE_OTHER): Payer: Medicare Other | Admitting: Family Medicine

## 2015-05-03 VITALS — BP 124/87 | HR 78 | Temp 98.0°F | Ht 62.0 in | Wt 129.0 lb

## 2015-05-03 DIAGNOSIS — R52 Pain, unspecified: Secondary | ICD-10-CM

## 2015-05-03 DIAGNOSIS — S92301A Fracture of unspecified metatarsal bone(s), right foot, initial encounter for closed fracture: Secondary | ICD-10-CM | POA: Diagnosis not present

## 2015-05-03 MED ORDER — TRAMADOL HCL 50 MG PO TABS: ORAL_TABLET | ORAL | Status: DC

## 2015-05-03 NOTE — Patient Instructions (Addendum)
Ice off and on for the next 48 hours with as much elevation of the foot as possible Use fracture boot when on feet Use crutches if necessary  return to clinic in 1 week for recheck

## 2015-05-03 NOTE — Progress Notes (Signed)
Subjective:    Patient ID: Lynn [name], female    DOB: February 24, 1946, 69 y.o.   MRN: [id number]  HPI Patient here today for right foot pain. As she was packing the car today, she heard a "pop" in her foot. She is hurting in the top of her right foot laterally.     Patient Active Problem List   Diagnosis Date Noted  . Incontinence in female 06/25/2014  . Depression 06/25/2014  . Hearing deficit 06/25/2014  . Osteoporosis 06/24/2013  . Hypertension 05/11/2013  . Osteoarthritis 05/11/2013  . Vitamin D deficiency 05/11/2013  . Personal history of colonic polyps 05/11/2013  . Hyperlipidemia 05/11/2013   Outpatient Encounter Prescriptions as of 05/03/2015  Medication Sig  . aspirin 81 MG tablet Take 81 mg by mouth daily.  . calcium carbonate (OS-CAL) 600 MG TABS tablet Take 600 mg by mouth daily with breakfast.   . cholecalciferol (VITAMIN D) 1000 UNITS tablet Take 1,000 Units by mouth daily.  . diclofenac (VOLTAREN) 75 MG EC tablet Take 1 tablet (75 mg total) by mouth 2 (two) times daily.  . fluticasone (FLONASE) 50 MCG/ACT nasal spray One to 2 sprays each nostril at bedtime for allergic rhinitis  . hydrochlorothiazide (MICROZIDE) 12.5 MG capsule TAKE ONE CAPSULE BY MOUTH EVERY DAY  . losartan (COZAAR) 50 MG tablet Take 1 tablet (50 mg total) by mouth daily.  . raloxifene (EVISTA) 60 MG tablet TAKE 1 TABLET (60 MG TOTAL) BY MOUTH DAILY.   No facility-administered encounter medications on file as of 05/03/2015.      Review of Systems  Constitutional: Negative.   HENT: Negative.   Eyes: Negative.   Respiratory: Negative.   Cardiovascular: Negative.   Gastrointestinal: Negative.   Endocrine: Negative.   Genitourinary: Negative.   Musculoskeletal: Positive for arthralgias (right foot pain).  Skin: Negative.   Allergic/Immunologic: Negative.   Neurological: Negative.   Hematological: Negative.   Psychiatric/Behavioral: Negative.        Objective:   Physical Exam   Constitutional: She is oriented to person, place, and time. She appears well-developed and well-nourished.  Musculoskeletal: She exhibits tenderness.  There is tenderness over the proximal fifth metatarsal where the fracture is located per x-ray  Neurological: She is alert and oriented to person, place, and time.  Skin: Skin is warm and dry. No rash noted.  Psychiatric: She has a normal mood and affect. Her behavior is normal. Judgment and thought content normal.  Nursing note and vitals reviewed.  BP 124/87 mmHg  Pulse 78  Temp(Src) 98 F (36.7 C) (Oral)  Ht 5\' 2"  (1.575 m)  Wt 129 lb (58.514 kg)  BMI 23.59 kg/m2  [facility] reading (PRIMARY) by  Dr. Oneal Grout fifth metatarsal fracture nondisplaced                                        Assessment & Plan:  1. Fracture of fifth metatarsal bone, right, closed, initial encounter -Use ice over the area of pain for the next 48 hours 20 minutes at a time frequently through the day and elevate the foot as much as possible -No weightbearing unless using fracture boot - For home use only DME Other see comment  Patient Instructions  Ice off and on for the next 48 hours with as much elevation of the foot as possible Use fracture boot when on feet Use crutches if necessary  return  to clinic in 1 week for recheck   Arrie Senate MD

## 2015-05-11 ENCOUNTER — Encounter: Payer: Self-pay | Admitting: Family Medicine

## 2015-05-11 ENCOUNTER — Ambulatory Visit (INDEPENDENT_AMBULATORY_CARE_PROVIDER_SITE_OTHER): Payer: Medicare Other | Admitting: Family Medicine

## 2015-05-11 VITALS — BP 125/83 | HR 79 | Temp 98.6°F | Ht 62.0 in | Wt 130.0 lb

## 2015-05-11 DIAGNOSIS — S92901D Unspecified fracture of right foot, subsequent encounter for fracture with routine healing: Secondary | ICD-10-CM

## 2015-05-11 NOTE — Progress Notes (Signed)
Subjective:    Patient ID: Lynn [name], female    DOB: 1945-09-05, 69 y.o.   MRN: [id number]  HPI Pt here for follow up on right foot fracture. The patient has an acute acute nondisplaced transverse [place] oriented fracture through the base of the right fifth metatarsal. She's been wearing a fracture be since this was diagnosed. She is concerned that she may need to see an orthopedic doctor and we will be happy to make that referral for her. The patient's pain is not as bad as it was initially.      Patient Active Problem List   Diagnosis Date Noted  . Incontinence in female 06/25/2014  . Depression 06/25/2014  . Hearing deficit 06/25/2014  . Osteoporosis 06/24/2013  . Hypertension 05/11/2013  . Osteoarthritis 05/11/2013  . Vitamin D deficiency 05/11/2013  . Personal history of colonic polyps 05/11/2013  . Hyperlipidemia 05/11/2013   Outpatient Encounter Prescriptions as of 05/11/2015  Medication Sig  . aspirin 81 MG tablet Take 81 mg by mouth daily.  . calcium carbonate (OS-CAL) 600 MG TABS tablet Take 600 mg by mouth daily with breakfast.   . cholecalciferol (VITAMIN D) 1000 UNITS tablet Take 1,000 Units by mouth daily.  . diclofenac (VOLTAREN) 75 MG EC tablet Take 1 tablet (75 mg total) by mouth 2 (two) times daily.  . hydrochlorothiazide (MICROZIDE) 12.5 MG capsule TAKE ONE CAPSULE BY MOUTH EVERY DAY  . losartan (COZAAR) 50 MG tablet Take 1 tablet (50 mg total) by mouth daily.  . raloxifene (EVISTA) 60 MG tablet TAKE 1 TABLET (60 MG TOTAL) BY MOUTH DAILY.  . traMADol (ULTRAM) 50 MG tablet 1/2 to 1 whole tab every 6 hours PRN  . [DISCONTINUED] fluticasone (FLONASE) 50 MCG/ACT nasal spray One to 2 sprays each nostril at bedtime for allergic rhinitis   No facility-administered encounter medications on file as of 05/11/2015.      Review of Systems  Constitutional: Negative.   HENT: Negative.   Eyes: Negative.   Respiratory: Negative.   Cardiovascular: Negative.     Gastrointestinal: Negative.   Endocrine: Negative.   Genitourinary: Negative.   Musculoskeletal: Positive for arthralgias (still having right foot pain ).  Skin: Negative.   Allergic/Immunologic: Negative.   Neurological: Negative.   Hematological: Negative.   Psychiatric/Behavioral: Negative.        Objective:   Physical Exam  Constitutional: She is oriented to person, place, and time. She appears well-developed and well-nourished. No distress.  Musculoskeletal: She exhibits tenderness. She exhibits no edema.  The patient is using a cane and has a fracture shoe not a fracture boot. There is minimal swelling around the base of the fifth metatarsal but it remains tender to palpation.  Neurological: She is alert and oriented to person, place, and time.  Skin: Skin is warm and dry. No rash noted. No erythema.  Psychiatric: She has a normal mood and affect. Her behavior is normal. Judgment and thought content normal.  Nursing note and vitals reviewed.  BP 125/83 mmHg  Pulse 79  Temp(Src) 98.6 F (37 C) (Oral)  Ht 5\' 2"  (1.575 m)  Wt 130 lb (58.968 kg)  BMI 23.77 kg/m2        Assessment & Plan:  1. Foot fracture, right, with routine healing, subsequent encounter -We will do a referral to [facility] because the patient is now living in [place] to follow-up on the fifth metatarsal fracture -Patient will take a copy of the x-ray report with her to that  visit -Continue wearing fracture boot/shoe until that visit occurs. - Ambulatory referral to Orthopedic Surgery  Patient Instructions  The patient should have a better supportive shoe and she will bring one by for [place] to look at. She should wear the shoe anytime she is on her feet We will make an appointment for her to follow-up with orthopedist in [place] because they're now living in [place].   Arrie Senate MD

## 2015-05-11 NOTE — Patient Instructions (Signed)
The patient should have a better supportive shoe and she will bring one by for [place] to look at. She should wear the shoe anytime she is on her feet We will make an appointment for her to follow-up with orthopedist in [place] because they're now living in [place].

## 2015-05-13 ENCOUNTER — Telehealth: Payer: Self-pay | Admitting: Family Medicine

## 2015-07-11 ENCOUNTER — Other Ambulatory Visit: Payer: Self-pay | Admitting: Family Medicine

## 2015-07-11 NOTE — Telephone Encounter (Signed)
done

## 2015-08-04 ENCOUNTER — Ambulatory Visit: Payer: Medicare Other | Admitting: Family Medicine

## 2015-08-08 ENCOUNTER — Ambulatory Visit: Payer: Medicare Other | Admitting: Family Medicine

## 2015-08-11 ENCOUNTER — Ambulatory Visit (INDEPENDENT_AMBULATORY_CARE_PROVIDER_SITE_OTHER): Payer: Medicare Other

## 2015-08-11 ENCOUNTER — Encounter: Payer: Self-pay | Admitting: Family Medicine

## 2015-08-11 ENCOUNTER — Ambulatory Visit (INDEPENDENT_AMBULATORY_CARE_PROVIDER_SITE_OTHER): Payer: Medicare Other | Admitting: Family Medicine

## 2015-08-11 VITALS — BP 120/79 | HR 79 | Temp 97.3°F | Ht 62.0 in | Wt 129.0 lb

## 2015-08-11 DIAGNOSIS — Z780 Asymptomatic menopausal state: Secondary | ICD-10-CM

## 2015-08-11 DIAGNOSIS — M150 Primary generalized (osteo)arthritis: Secondary | ICD-10-CM

## 2015-08-11 DIAGNOSIS — R202 Paresthesia of skin: Secondary | ICD-10-CM

## 2015-08-11 DIAGNOSIS — S92301A Fracture of unspecified metatarsal bone(s), right foot, initial encounter for closed fracture: Secondary | ICD-10-CM

## 2015-08-11 DIAGNOSIS — E785 Hyperlipidemia, unspecified: Secondary | ICD-10-CM | POA: Diagnosis not present

## 2015-08-11 DIAGNOSIS — M5090 Cervical disc disorder, unspecified, unspecified cervical region: Secondary | ICD-10-CM | POA: Diagnosis not present

## 2015-08-11 DIAGNOSIS — D72829 Elevated white blood cell count, unspecified: Secondary | ICD-10-CM

## 2015-08-11 DIAGNOSIS — E559 Vitamin D deficiency, unspecified: Secondary | ICD-10-CM | POA: Diagnosis not present

## 2015-08-11 DIAGNOSIS — M810 Age-related osteoporosis without current pathological fracture: Secondary | ICD-10-CM

## 2015-08-11 DIAGNOSIS — M159 Polyosteoarthritis, unspecified: Secondary | ICD-10-CM

## 2015-08-11 DIAGNOSIS — M4306 Spondylolysis, lumbar region: Secondary | ICD-10-CM

## 2015-08-11 DIAGNOSIS — I10 Essential (primary) hypertension: Secondary | ICD-10-CM

## 2015-08-11 DIAGNOSIS — N644 Mastodynia: Secondary | ICD-10-CM

## 2015-08-11 MED ORDER — HYDROCHLOROTHIAZIDE 12.5 MG PO CAPS: 12.5000 mg | ORAL_CAPSULE | Freq: Every day | ORAL | Status: DC

## 2015-08-11 NOTE — Patient Instructions (Addendum)
Medicare Annual Wellness Visit  [facility] and the medical providers at [facility] strive to bring you the best medical care.  In doing so we not only want to address your current medical conditions and concerns but also to detect new conditions early and prevent illness, disease and health-related problems.    Medicare offers a yearly Wellness Visit which allows our clinical staff to assess your need for preventative services including immunizations, lifestyle education, counseling to decrease risk of preventable diseases and screening for fall risk and other medical concerns.    This visit is provided free of charge (no copay) for all Medicare recipients. The clinical pharmacists at [facility] have begun to conduct these Wellness Visits which will also include a thorough review of all your medications.    As you primary medical provider recommend that you make an appointment for your Annual Wellness Visit if you have not done so already this year.  You may set up this appointment before you leave today or you may call back [phone] and schedule an appointment.  Please make sure when you call that you mention that you are scheduling your Annual Wellness Visit with the clinical pharmacist so that the appointment may be made for the proper length of time.     Continue current medications. Continue good therapeutic lifestyle changes which include good diet and exercise. Fall precautions discussed with patient. If an FOBT was given today- please return it to our front desk. If you are over 71 years old - you may need Prevnar 62 or the adult Pneumonia vaccine.  **Flu shots are available--- please call and schedule a FLU-CLINIC appointment**  After your visit with [place] today you will receive a survey in the mail or online from [employer] regarding your care with [place]. Please take a moment to fill this out. Your feedback is very  important to [place] as you can help [place] better understand your patient needs as well as improve your experience and satisfaction. WE CARE ABOUT YOU!!!   The patient should follow-up with physical therapy and orthopedics as planned She should continue to be careful and did not put herself at risk for falling but also find a way to exercise and keep her muscles strong to keep from falling and hopefully physical therapy can help with this. She should take copies of the x-ray reports with her to physical therapy She should follow-up with neurosurgery as needed and psychiatry as needed If she continues to have problems with breast soreness following the most recent mammogram she should call Shelah Lewandowsky back in about 4 weeks and we will arrange for an ultrasound Take Zantac or ranitidine 150 mg twice daily before breakfast and supper for 1 month see if this helps indigestion and the thickness of congestion in the throat Also don't forget to use cool mist humidification, keep the house as cool as possible and use nasal saline and Mucinex for cough and congestion

## 2015-08-11 NOTE — Progress Notes (Signed)
Subjective:    Patient ID: Lynn [name], female    DOB: 1946-02-20, 70 y.o.   MRN: [id number]  HPI Pt here for follow up and management of chronic medical problems which includes hypertension and hyperlipidemia. She is taking medications regularly. The patient denies chest pain but does occasionally have some pressure with exertion but not always. She thinks it is due to being in active and having had this recent foot fracture and the dry heat of the winter. She denies any significant shortness of breath. She says she has occasional indigestion and thickness of congestion in her throat. She has not had any blood in the stool or black tarry bowel movements. She is passing water without problems. She does have some tingling in her feet and weakness in her lower legs and this is been an ongoing complaint. She has arrange for physical therapy and we will give her copies of previous MRI reports of neck and back for their information when she sees the physical therapist. She had eaten today but we will go ahead and do her lab work today since she is now living in [place]. We will also get her DEXA scan done today.     Patient Active Problem List   Diagnosis Date Noted  . Incontinence in female 06/25/2014  . Depression 06/25/2014  . Hearing deficit 06/25/2014  . Osteoporosis 06/24/2013  . Hypertension 05/11/2013  . Osteoarthritis 05/11/2013  . Vitamin D deficiency 05/11/2013  . Personal history of colonic polyps 05/11/2013  . Hyperlipidemia 05/11/2013   Outpatient Encounter Prescriptions as of 08/11/2015  Medication Sig  . aspirin 81 MG tablet Take 81 mg by mouth daily.  . calcium carbonate (OS-CAL) 600 MG TABS tablet Take 600 mg by mouth daily with breakfast.   . cholecalciferol (VITAMIN D) 1000 UNITS tablet Take 1,000 Units by mouth daily.  . diclofenac (VOLTAREN) 75 MG EC tablet Take 1 tablet (75 mg total) by mouth 2 (two) times daily.  . hydrochlorothiazide (MICROZIDE) 12.5 MG  capsule Take 1 capsule (12.5 mg total) by mouth daily.  Marland Kitchen losartan (COZAAR) 50 MG tablet Take 1 tablet (50 mg total) by mouth daily.  . raloxifene (EVISTA) 60 MG tablet TAKE 1 TABLET (60 MG TOTAL) BY MOUTH DAILY.  . [DISCONTINUED] hydrochlorothiazide (MICROZIDE) 12.5 MG capsule TAKE ONE CAPSULE BY MOUTH EVERY DAY  . [DISCONTINUED] traMADol (ULTRAM) 50 MG tablet 1/2 to 1 whole tab every 6 hours PRN   No facility-administered encounter medications on file as of 08/11/2015.      Review of Systems  Constitutional: Negative.   HENT: Negative.   Eyes: Negative.   Respiratory: Negative.   Cardiovascular: Negative.   Gastrointestinal: Negative.   Endocrine: Negative.   Genitourinary: Negative.   Musculoskeletal: Negative.   Skin: Negative.   Allergic/Immunologic: Negative.   Neurological: Negative.        Tingling in feet Off- balance  Hematological: Negative.   Psychiatric/Behavioral: Negative.        Objective:   Physical Exam  Constitutional: She is oriented to person, place, and time. She appears well-developed and well-nourished.  HENT:  Head: Normocephalic and atraumatic.  Right Ear: External ear normal.  Left Ear: External ear normal.  Mouth/Throat: Oropharynx is clear and moist.  Nasal passages somewhat dry in appearance  Eyes: Conjunctivae and EOM are normal. Pupils are equal, round, and reactive to light. Right eye exhibits no discharge. Left eye exhibits no discharge. No scleral icterus.  Up-to-date on eye exams  Neck: Normal  range of motion. Neck supple. No thyromegaly present.  Limited range of motion because of cervical disc disease  Cardiovascular: Normal rate, regular rhythm, normal heart sounds and intact distal pulses.   No murmur heard. Pulmonary/Chest: Effort normal and breath sounds normal. No respiratory distress. She has no wheezes. She has no rales. She exhibits no tenderness.  Clear anteriorly and posteriorly  Abdominal: Soft. Bowel sounds are normal.  She exhibits no mass. There is no tenderness. There is no rebound and no guarding.  Slight epigastric tenderness without liver or spleen enlargement and no inguinal adenopathy.  Genitourinary:  The mid-level checked both breasts and other than some tenderness did not find any masses or worrisome findings that would warrant further studies at this time. Her last mammogram was about 6 months ago. She will be told him after visit summary note that if this continues to be a problem she will call the mid-level back and she will arrange to get an ultrasound of the breast.  Musculoskeletal: She exhibits tenderness. She exhibits no edema.  The patient has a lot of stiffness and rigidity from her lumbar spondylosis in her cervical disc disease.  Lymphadenopathy:    She has no cervical adenopathy.  Neurological: She is alert and oriented to person, place, and time. No cranial nerve deficit.  Lower extremity reflexes are decreased on the right  Skin: Skin is warm and dry. No rash noted.  Psychiatric: She has a normal mood and affect. Her behavior is normal. Judgment and thought content normal.  Nursing note and vitals reviewed.   BP 120/79 mmHg  Pulse 79  Temp(Src) 97.3 F (36.3 C) (Oral)  Ht 5' 2"  (1.575 m)  Wt 129 lb (58.514 kg)  BMI 23.59 kg/m2  DEXA-today--     Assessment & Plan:  1. Vitamin D deficiency -Continue current treatment pending results of lab work - CBC with Differential/Platelet - VITAMIN D 25 Hydroxy (Vit-D Deficiency, Fractures) - DG Bone Density; Future  2. Essential hypertension -The blood pressure is good today and she will continue with current treatment - BMP8+EGFR - CBC with Differential/Platelet - Hepatic function panel  3. Hyperlipidemia -The patient will continue with aggressive therapeutic lifestyle changes as possible with diet to help keep her cholesterol under better control - CBC with Differential/Platelet - Lipid panel  4. Osteoporosis -DEXA scan  will be done today. - CBC with Differential/Platelet - DG Bone Density; Future  5. Primary osteoarthritis involving multiple joints -Stay as active as possible, follow through with physical therapy reminding them of abnormalities in neck and back with MRI reports. - CBC with Differential/Platelet - DG Bone Density; Future  6. Postmenopausal -She is up-to-date on her mammogram and pelvic exams. - CBC with Differential/Platelet - DG Bone Density; Future  7. Spondylolysis of lumbar region -Continue Voltaren and as needed follow-ups with orthopedic neurosurgery and physical therapy  8. Paresthesia of both feet -Continue with Voltaren as needed follow-up with specialist  9. Fracture of fifth metatarsal bone, right, closed, initial encounter -Continue follow-up with orthopedic surgeon  10. Cervical disc disease -Continue to follow-up with neurosurgery  11. Breast tenderness in female -If breast sensitivity remains after 4 weeks, patient will call back and she will have an ultrasound scheduled of the breast.  Meds ordered this encounter  Medications  . hydrochlorothiazide (MICROZIDE) 12.5 MG capsule    Sig: Take 1 capsule (12.5 mg total) by mouth daily.    Dispense:  30 capsule    Refill:  11   Patient  Instructions                       Medicare Annual Wellness Visit  [facility] and the medical providers at [facility] strive to bring you the best medical care.  In doing so we not only want to address your current medical conditions and concerns but also to detect new conditions early and prevent illness, disease and health-related problems.    Medicare offers a yearly Wellness Visit which allows our clinical staff to assess your need for preventative services including immunizations, lifestyle education, counseling to decrease risk of preventable diseases and screening for fall risk and other medical concerns.    This visit is provided free of charge (no  copay) for all Medicare recipients. The clinical pharmacists at [facility] have begun to conduct these Wellness Visits which will also include a thorough review of all your medications.    As you primary medical provider recommend that you make an appointment for your Annual Wellness Visit if you have not done so already this year.  You may set up this appointment before you leave today or you may call back ([phone]) and schedule an appointment.  Please make sure when you call that you mention that you are scheduling your Annual Wellness Visit with the clinical pharmacist so that the appointment may be made for the proper length of time.     Continue current medications. Continue good therapeutic lifestyle changes which include good diet and exercise. Fall precautions discussed with patient. If an FOBT was given today- please return it to our front desk. If you are over 79 years old - you may need Prevnar 29 or the adult Pneumonia vaccine.  **Flu shots are available--- please call and schedule a FLU-CLINIC appointment**  After your visit with [place] today you will receive a survey in the mail or online from [employer] regarding your care with [place]. Please take a moment to fill this out. Your feedback is very important to [place] as you can help [place] better understand your patient needs as well as improve your experience and satisfaction. WE CARE ABOUT YOU!!!   The patient should follow-up with physical therapy and orthopedics as planned She should continue to be careful and did not put herself at risk for falling but also find a way to exercise and keep her muscles strong to keep from falling and hopefully physical therapy can help with this. She should take copies of the x-ray reports with her to physical therapy She should follow-up with neurosurgery as needed and psychiatry as needed If she continues to have problems with breast soreness following the most recent mammogram she  should call Shelah Lewandowsky back in about 4 weeks and we will arrange for an ultrasound Take Zantac or ranitidine 150 mg twice daily before breakfast and supper for 1 month see if this helps indigestion and the thickness of congestion in the throat Also don't forget to use cool mist humidification, keep the house as cool as possible and use nasal saline and Mucinex for cough and congestion   Arrie Senate MD

## 2015-08-12 LAB — HEPATIC FUNCTION PANEL
ALBUMIN: 4.2 g/dL (ref 3.5–4.8)
ALT: 17 IU/L (ref 0–32)
AST: 14 IU/L (ref 0–40)
Alkaline Phosphatase: 65 IU/L (ref 39–117)
BILIRUBIN TOTAL: 0.4 mg/dL (ref 0.0–1.2)
BILIRUBIN, DIRECT: 0.11 mg/dL (ref 0.00–0.40)
Total Protein: 6.2 g/dL (ref 6.0–8.5)

## 2015-08-12 LAB — BMP8+EGFR
BUN/Creatinine Ratio: 29 — ABNORMAL HIGH (ref 11–26)
BUN: 25 mg/dL (ref 8–27)
CO2: 27 mmol/L (ref 18–29)
Calcium: 9.7 mg/dL (ref 8.7–10.3)
Chloride: 100 mmol/L (ref 96–106)
Creatinine, Ser: 0.87 mg/dL (ref 0.57–1.00)
GFR, EST AFRICAN AMERICAN: 78 mL/min/{1.73_m2} (ref >59)
GFR, EST NON AFRICAN AMERICAN: 68 mL/min/{1.73_m2} (ref >59)
Glucose: 75 mg/dL (ref 65–99)
POTASSIUM: 4.2 mmol/L (ref 3.5–5.2)
SODIUM: 140 mmol/L (ref 134–144)

## 2015-08-12 LAB — LIPID PANEL
CHOL/HDL RATIO: 3.9 ratio (ref 0.0–4.4)
Cholesterol, Total: 228 mg/dL — ABNORMAL HIGH (ref 100–199)
HDL: 58 mg/dL (ref >39)
LDL CALC: 138 mg/dL — AB (ref 0–99)
Triglycerides: 158 mg/dL — ABNORMAL HIGH (ref 0–149)
VLDL CHOLESTEROL CAL: 32 mg/dL (ref 5–40)

## 2015-08-12 LAB — CBC WITH DIFFERENTIAL/PLATELET
BASOS: 0 %
Basophils Absolute: 0 10*3/uL (ref 0.0–0.2)
EOS (ABSOLUTE): 0.1 10*3/uL (ref 0.0–0.4)
EOS: 1 %
Hematocrit: 44 % (ref 34.0–46.6)
Hemoglobin: 14.7 g/dL (ref 11.1–15.9)
IMMATURE GRANS (ABS): 0 10*3/uL (ref 0.0–0.1)
IMMATURE GRANULOCYTES: 0 %
LYMPHS: 9 %
Lymphocytes Absolute: 1.3 10*3/uL (ref 0.7–3.1)
MCH: 32.2 pg (ref 26.6–33.0)
MCHC: 33.4 g/dL (ref 31.5–35.7)
MCV: 96 fL (ref 79–97)
MONOS ABS: 1.3 10*3/uL — AB (ref 0.1–0.9)
Monocytes: 10 %
NEUTROS PCT: 80 %
Neutrophils Absolute: 10.7 10*3/uL — ABNORMAL HIGH (ref 1.4–7.0)
PLATELETS: 337 10*3/uL (ref 150–379)
RBC: 4.57 x10E6/uL (ref 3.77–5.28)
RDW: 13.1 % (ref 12.3–15.4)
WBC: 13.4 10*3/uL — AB (ref 3.4–10.8)

## 2015-08-12 LAB — VITAMIN D 25 HYDROXY (VIT D DEFICIENCY, FRACTURES): VIT D 25 HYDROXY: 32 ng/mL (ref 30.0–100.0)

## 2015-08-12 NOTE — Addendum Note (Signed)
Addended by: Thana Ates on: 08/12/2015 10:04 AM   Modules accepted: Orders

## 2015-08-25 ENCOUNTER — Other Ambulatory Visit: Payer: Medicare Other

## 2015-08-25 DIAGNOSIS — D72829 Elevated white blood cell count, unspecified: Secondary | ICD-10-CM

## 2015-08-25 NOTE — Progress Notes (Signed)
Lab only 

## 2015-08-26 LAB — CBC WITH DIFFERENTIAL/PLATELET
Basophils Absolute: 0 10*3/uL (ref 0.0–0.2)
Basos: 0 %
EOS (ABSOLUTE): 0.1 10*3/uL (ref 0.0–0.4)
Eos: 1 %
HEMATOCRIT: 42.3 % (ref 34.0–46.6)
Hemoglobin: 14.3 g/dL (ref 11.1–15.9)
IMMATURE GRANS (ABS): 0.1 10*3/uL (ref 0.0–0.1)
IMMATURE GRANULOCYTES: 1 %
LYMPHS: 13 %
Lymphocytes Absolute: 1.4 10*3/uL (ref 0.7–3.1)
MCH: 32.4 pg (ref 26.6–33.0)
MCHC: 33.8 g/dL (ref 31.5–35.7)
MCV: 96 fL (ref 79–97)
MONOS ABS: 1 10*3/uL — AB (ref 0.1–0.9)
Monocytes: 9 %
NEUTROS PCT: 76 %
Neutrophils Absolute: 8.5 10*3/uL — ABNORMAL HIGH (ref 1.4–7.0)
Platelets: 333 10*3/uL (ref 150–379)
RBC: 4.42 x10E6/uL (ref 3.77–5.28)
RDW: 12.8 % (ref 12.3–15.4)
WBC: 11.1 10*3/uL — AB (ref 3.4–10.8)

## 2015-09-21 ENCOUNTER — Telehealth: Payer: Self-pay | Admitting: Family Medicine

## 2015-09-21 NOTE — Telephone Encounter (Signed)
Patient will stop by so [facility] can look at this afternoon

## 2015-10-22 ENCOUNTER — Other Ambulatory Visit: Payer: Self-pay | Admitting: Family Medicine

## 2015-11-24 ENCOUNTER — Encounter: Payer: Self-pay | Admitting: Family Medicine

## 2015-11-24 ENCOUNTER — Ambulatory Visit (INDEPENDENT_AMBULATORY_CARE_PROVIDER_SITE_OTHER): Payer: Medicare Other

## 2015-11-24 ENCOUNTER — Ambulatory Visit (INDEPENDENT_AMBULATORY_CARE_PROVIDER_SITE_OTHER): Payer: Medicare Other | Admitting: Family Medicine

## 2015-11-24 VITALS — BP 133/88 | HR 70 | Temp 97.1°F | Ht 62.0 in | Wt 129.0 lb

## 2015-11-24 DIAGNOSIS — M25511 Pain in right shoulder: Secondary | ICD-10-CM | POA: Diagnosis not present

## 2015-11-24 DIAGNOSIS — Z1211 Encounter for screening for malignant neoplasm of colon: Secondary | ICD-10-CM

## 2015-11-24 DIAGNOSIS — E785 Hyperlipidemia, unspecified: Secondary | ICD-10-CM

## 2015-11-24 DIAGNOSIS — I10 Essential (primary) hypertension: Secondary | ICD-10-CM

## 2015-11-24 DIAGNOSIS — E559 Vitamin D deficiency, unspecified: Secondary | ICD-10-CM | POA: Diagnosis not present

## 2015-11-24 MED ORDER — RALOXIFENE HCL 60 MG PO TABS: 60.0000 mg | ORAL_TABLET | Freq: Every day | ORAL | Status: DC

## 2015-11-24 NOTE — Progress Notes (Signed)
Subjective:    Patient ID: Lynn [name], female    DOB: [birth year], 70 y.o.   MRN: [id number]  HPI Pt here for follow up and management of chronic medical problems which includes hypertension and hyperlipidemia. She is taking medications regularly.The patient is complaining again with arthritis in her hands and being off balance. We know from past history that she has severe osteoarthritis spondylosis in her lumbar spine and spinal stenosis. She lost her left great toenail and she wants [place] to look at this again. She also complains of some arthritis in her hands. She is taking diclofenac regularly. She's taken her blood pressure medicine regularly. She is due to get a chest x-ray today and we will get a right shoulder film also because of right shoulder pain. She is also due to return an FOBT and get lab work. She denies any chest pain or shortness of breath. Occasionally she's had some epigastric discomfort not described as heartburn. Her stools are somewhat firm and hard and this is been the case all along. She denies any blood in the stool or black tarry bowel movements. She is passing her water without problems. She has not injured her right shoulder in any way and describes the pain as sort of a vague discomfort and it may be arthritic related she says. She does have this gait instability issue and we reviewed her previous MRI of the LS-spine which indicates that she has spondylosis and spinal stenosis. She understands that if this problem continues she will need to go back to see the neurosurgeon. The hands have been bothering her more than usual and she has some tingling in her hands as well as sometimes in her feet. She is passing her water without problems.     Patient Active Problem List   Diagnosis Date Noted  . Incontinence in female 06/25/2014  . Depression 06/25/2014  . Hearing deficit 06/25/2014  . Osteoporosis 06/24/2013  . Hypertension 05/11/2013  . Osteoarthritis  05/11/2013  . Vitamin D deficiency 05/11/2013  . Personal history of colonic polyps 05/11/2013  . Hyperlipidemia 05/11/2013   Outpatient Encounter Prescriptions as of 11/24/2015  Medication Sig  . aspirin 81 MG tablet Take 81 mg by mouth daily.  . calcium carbonate (OS-CAL) 600 MG TABS tablet Take 600 mg by mouth daily with breakfast.   . cholecalciferol (VITAMIN D) 1000 UNITS tablet Take 1,000 Units by mouth daily.  . diclofenac (VOLTAREN) 75 MG EC tablet Take 1 tablet (75 mg total) by mouth 2 (two) times daily.  . hydrochlorothiazide (MICROZIDE) 12.5 MG capsule Take 1 capsule (12.5 mg total) by mouth daily.  Marland Kitchen losartan (COZAAR) 50 MG tablet Take 1 tablet (50 mg total) by mouth daily.  . raloxifene (EVISTA) 60 MG tablet TAKE 1 TABLET BY MOUTH DAILY  . [DISCONTINUED] raloxifene (EVISTA) 60 MG tablet TAKE 1 TABLET (60 MG TOTAL) BY MOUTH DAILY.   No facility-administered encounter medications on file as of 11/24/2015.      Review of Systems  Constitutional: Negative.   HENT: Negative.   Eyes: Negative.   Respiratory: Negative.   Cardiovascular: Negative.   Gastrointestinal: Negative.   Endocrine: Negative.   Genitourinary: Negative.   Musculoskeletal: Positive for arthralgias (bilateral hands, right shoulder).  Skin: Negative.        rck left great toenail  Allergic/Immunologic: Negative.   Neurological: Negative.        Off-balance  Hematological: Negative.   Psychiatric/Behavioral: Negative.  Objective:   Physical Exam  Constitutional: She is oriented to person, place, and time. She appears well-developed and well-nourished. No distress.  HENT:  Head: Normocephalic and atraumatic.  Right Ear: External ear normal.  Left Ear: External ear normal.  Mouth/Throat: Oropharynx is clear and moist. No oropharyngeal exudate.  Some nasal turbinate congestion bilaterally  Eyes: Conjunctivae and EOM are normal. Pupils are equal, round, and reactive to light. Right eye exhibits  no discharge. Left eye exhibits no discharge. No scleral icterus.  Neck: Normal range of motion. Neck supple. No thyromegaly present.  No bruits thyromegaly or anterior cervical adenopathy  Cardiovascular: Normal rate, regular rhythm, normal heart sounds and intact distal pulses.   No murmur heard. The heart is regular at 72/m  Pulmonary/Chest: Effort normal and breath sounds normal. No respiratory distress. She has no wheezes. She has no rales.  Abdominal: Soft. Bowel sounds are normal. She exhibits no mass. There is no tenderness. There is no rebound and no guarding.  Generalized abdominal tenderness without liver or spleen enlargement. No bruits. No masses.  Musculoskeletal: Normal range of motion. She exhibits tenderness. She exhibits no edema.  Minimal swelling in the MP joints of both hands but no rubor or fever. Minimal tenderness to palpation.  Lymphadenopathy:    She has no cervical adenopathy.  Neurological: She is alert and oriented to person, place, and time. She has normal reflexes. No cranial nerve deficit.  Skin: Skin is warm and dry. No rash noted.  The left great toenail appears to be regrowing and may have some fungal involvement. We will keep an eye on this.  Psychiatric: She has a normal mood and affect. Her behavior is normal. Judgment and thought content normal.  Nursing note and vitals reviewed.  BP 133/88 mmHg  Pulse 70  Temp(Src) 97.1 F (36.2 C) (Oral)  Ht 5' 2"  (1.575 m)  Wt 129 lb (58.514 kg)  BMI 23.59 kg/m2        Assessment & Plan:  1. Vitamin D deficiency -Continue current treatment pending results of lab work - CBC with Differential/Platelet - VITAMIN D 25 Hydroxy (Vit-D Deficiency, Fractures)  2. Essential hypertension -Continue current treatment and continue to monitor blood pressure readings at home which the patient says are similar to what we got an office today. - DG Chest 2 View; Future - BMP8+EGFR - CBC with Differential/Platelet -  Hepatic function panel  3. Hyperlipidemia -Continue with aggressive therapeutic lifestyle changes pending results of lab work - [employer] 2 View; Future - CBC with Differential/Platelet - NMR, lipoprofile  4. Special screening for malignant neoplasms, colon - Fecal occult blood, imunochemical; Future  5. Right shoulder pain -Increase diclofenac to twice daily for 10-14 days after eating as directed and then gradually reduce back to once daily - DG Shoulder Right; Future - Arthritis Panel - CYCLIC CITRUL PEPTIDE ANTIBODY, IGG/IGA  Meds ordered this encounter  Medications  . raloxifene (EVISTA) 60 MG tablet    Sig: Take 1 tablet (60 mg total) by mouth daily.    Dispense:  30 tablet    Refill:  3    Patient Instructions                       Medicare Annual Wellness Visit  [facility] and the medical providers at [facility] strive to bring you the best medical care.  In doing so we not only want to address your current medical conditions and concerns but  also to detect new conditions early and prevent illness, disease and health-related problems.    Medicare offers a yearly Wellness Visit which allows our clinical staff to assess your need for preventative services including immunizations, lifestyle education, counseling to decrease risk of preventable diseases and screening for fall risk and other medical concerns.    This visit is provided free of charge (no copay) for all Medicare recipients. The clinical pharmacists at [facility] have begun to conduct these Wellness Visits which will also include a thorough review of all your medications.    As you primary medical provider recommend that you make an appointment for your Annual Wellness Visit if you have not done so already this year.  You may set up this appointment before you leave today or you may call back ([phone]) and schedule an appointment.  Please make sure when you call that  you mention that you are scheduling your Annual Wellness Visit with the clinical pharmacist so that the appointment may be made for the proper length of time.     Continue current medications. Continue good therapeutic lifestyle changes which include good diet and exercise. Fall precautions discussed with patient. If an FOBT was given today- please return it to our front desk. If you are over 60 years old - you may need Prevnar 79 or the adult Pneumonia vaccine.  **Flu shots are available--- please call and schedule a FLU-CLINIC appointment**  After your visit with [place] today you will receive a survey in the mail or online from [employer] regarding your care with [place]. Please take a moment to fill this out. Your feedback is very important to [place] as you can help [place] better understand your patient needs as well as improve your experience and satisfaction. WE CARE ABOUT YOU!!!   The patient should increase her diclofenac for 7-14 days taken 1 twice daily after breakfast and supper and then gradually decrease it back again to once daily after eating. This may help some of the increased inflammation that she is having in her hands and her shoulder. Of course she should watch first, and check her blood pressure regularly when she does this to make sure that she does not develop any gastritis symptoms. If she develops any heartburn and she can purchase ranitidine and take this twice daily before breakfast and supper and this is over-the-counter 150 mg She should call the neurosurgeon and make an appointment to be reevaluated because of her gait instability and off balance sensation. For constipation she could try some MiraLAX 3 nights weekly to see if this would help soften her stools. She also needs to drink more water and fluids. She should return the FOBT We will call with the results of the chest x-ray lab work and arthritis panel as soon as those results become available She should try using some Lamisil  on the left great toenail nightly.   Arrie Senate MD

## 2015-11-24 NOTE — Patient Instructions (Addendum)
Medicare Annual Wellness Visit  [facility] and the medical providers at [facility] strive to bring you the best medical care.  In doing so we not only want to address your current medical conditions and concerns but also to detect new conditions early and prevent illness, disease and health-related problems.    Medicare offers a yearly Wellness Visit which allows our clinical staff to assess your need for preventative services including immunizations, lifestyle education, counseling to decrease risk of preventable diseases and screening for fall risk and other medical concerns.    This visit is provided free of charge (no copay) for all Medicare recipients. The clinical pharmacists at [facility] have begun to conduct these Wellness Visits which will also include a thorough review of all your medications.    As you primary medical provider recommend that you make an appointment for your Annual Wellness Visit if you have not done so already this year.  You may set up this appointment before you leave today or you may call back [phone] and schedule an appointment.  Please make sure when you call that you mention that you are scheduling your Annual Wellness Visit with the clinical pharmacist so that the appointment may be made for the proper length of time.     Continue current medications. Continue good therapeutic lifestyle changes which include good diet and exercise. Fall precautions discussed with patient. If an FOBT was given today- please return it to our front desk. If you are over 71 years old - you may need Prevnar 7 or the adult Pneumonia vaccine.  **Flu shots are available--- please call and schedule a FLU-CLINIC appointment**  After your visit with [place] today you will receive a survey in the mail or online from [employer] regarding your care with [place]. Please take a moment to fill this out. Your feedback is very  important to [place] as you can help [place] better understand your patient needs as well as improve your experience and satisfaction. WE CARE ABOUT YOU!!!   The patient should increase her diclofenac for 7-14 days taken 1 twice daily after breakfast and supper and then gradually decrease it back again to once daily after eating. This may help some of the increased inflammation that she is having in her hands and her shoulder. Of course she should watch first, and check her blood pressure regularly when she does this to make sure that she does not develop any gastritis symptoms. If she develops any heartburn and she can purchase ranitidine and take this twice daily before breakfast and supper and this is over-the-counter 150 mg She should call the neurosurgeon and make an appointment to be reevaluated because of her gait instability and off balance sensation. For constipation she could try some MiraLAX 3 nights weekly to see if this would help soften her stools. She also needs to drink more water and fluids. She should return the FOBT We will call with the results of the chest x-ray lab work and arthritis panel as soon as those results become available She should try using some Lamisil on the left great toenail nightly.

## 2015-11-25 LAB — NMR, LIPOPROFILE
Cholesterol: 198 mg/dL (ref 100–199)
HDL CHOLESTEROL BY NMR: 58 mg/dL (ref >39)
HDL PARTICLE NUMBER: 33.9 umol/L (ref >=30.5)
LDL Particle Number: 1145 nmol/L — ABNORMAL HIGH (ref <1000)
LDL Size: 20.9 nm (ref >20.5)
LDL-C: 110 mg/dL — AB (ref 0–99)
LP-IR Score: 36 (ref <=45)
SMALL LDL PARTICLE NUMBER: 299 nmol/L (ref <=527)
TRIGLYCERIDES BY NMR: 150 mg/dL — AB (ref 0–149)

## 2015-11-25 LAB — ARTHRITIS PANEL
BASOS: 0 %
Basophils Absolute: 0 10*3/uL (ref 0.0–0.2)
EOS (ABSOLUTE): 0.1 10*3/uL (ref 0.0–0.4)
Eos: 1 %
HEMOGLOBIN: 14.1 g/dL (ref 11.1–15.9)
Hematocrit: 42.8 % (ref 34.0–46.6)
Immature Grans (Abs): 0 10*3/uL (ref 0.0–0.1)
Immature Granulocytes: 0 %
LYMPHS ABS: 1.4 10*3/uL (ref 0.7–3.1)
Lymphs: 13 %
MCH: 32.6 pg (ref 26.6–33.0)
MCHC: 32.9 g/dL (ref 31.5–35.7)
MCV: 99 fL — AB (ref 79–97)
MONOS ABS: 0.7 10*3/uL (ref 0.1–0.9)
Monocytes: 7 %
NEUTROS ABS: 8.5 10*3/uL — AB (ref 1.4–7.0)
Neutrophils: 79 %
Platelets: 345 10*3/uL (ref 150–379)
RBC: 4.32 x10E6/uL (ref 3.77–5.28)
RDW: 13.3 % (ref 12.3–15.4)
Sed Rate: 2 mm/hr (ref 0–40)
URIC ACID: 3.2 mg/dL (ref 2.5–7.1)
WBC: 10.8 10*3/uL (ref 3.4–10.8)

## 2015-11-25 LAB — HEPATIC FUNCTION PANEL
ALBUMIN: 4.2 g/dL (ref 3.5–4.8)
ALT: 20 IU/L (ref 0–32)
AST: 15 IU/L (ref 0–40)
Alkaline Phosphatase: 62 IU/L (ref 39–117)
BILIRUBIN TOTAL: 0.4 mg/dL (ref 0.0–1.2)
Bilirubin, Direct: 0.11 mg/dL (ref 0.00–0.40)
Total Protein: 6.4 g/dL (ref 6.0–8.5)

## 2015-11-25 LAB — VITAMIN D 25 HYDROXY (VIT D DEFICIENCY, FRACTURES): VIT D 25 HYDROXY: 30.5 ng/mL (ref 30.0–100.0)

## 2015-11-25 LAB — BMP8+EGFR
BUN / CREAT RATIO: 23 (ref 12–28)
BUN: 18 mg/dL (ref 8–27)
CHLORIDE: 99 mmol/L (ref 96–106)
CO2: 24 mmol/L (ref 18–29)
CREATININE: 0.79 mg/dL (ref 0.57–1.00)
Calcium: 10 mg/dL (ref 8.7–10.3)
GFR calc Af Amer: 88 mL/min/{1.73_m2} (ref >59)
GFR calc non Af Amer: 76 mL/min/{1.73_m2} (ref >59)
GLUCOSE: 83 mg/dL (ref 65–99)
Potassium: 3.9 mmol/L (ref 3.5–5.2)
SODIUM: 139 mmol/L (ref 134–144)

## 2015-11-25 LAB — CYCLIC CITRUL PEPTIDE ANTIBODY, IGG/IGA: Cyclic Citrullin Peptide Ab: 5 units (ref 0–19)

## 2016-02-16 ENCOUNTER — Telehealth: Payer: Self-pay | Admitting: Family Medicine

## 2016-02-16 MED ORDER — DICLOFENAC SODIUM 75 MG PO TBEC: 75.0000 mg | DELAYED_RELEASE_TABLET | Freq: Two times a day (BID) | ORAL | 6 refills | Status: DC

## 2016-02-16 NOTE — Telephone Encounter (Signed)
Detailed message left for patient that refill has been sent to pharmacy.

## 2016-03-12 ENCOUNTER — Encounter: Payer: Self-pay | Admitting: Internal Medicine

## 2016-03-12 ENCOUNTER — Ambulatory Visit (INDEPENDENT_AMBULATORY_CARE_PROVIDER_SITE_OTHER): Payer: Medicare Other | Admitting: Internal Medicine

## 2016-03-12 VITALS — BP 118/80 | HR 72 | Ht 62.5 in | Wt 130.0 lb

## 2016-03-12 DIAGNOSIS — L680 Hirsutism: Secondary | ICD-10-CM | POA: Diagnosis not present

## 2016-03-12 DIAGNOSIS — E349 Endocrine disorder, unspecified: Secondary | ICD-10-CM

## 2016-03-12 DIAGNOSIS — R7989 Other specified abnormal findings of blood chemistry: Secondary | ICD-10-CM

## 2016-03-12 NOTE — Progress Notes (Addendum)
Patient ID: Lynn [name], female   DOB: [birth year], 70 y.o.   MRN: [id number]    HPI  Lynn [name] is a 70 y.o.-year-old female, referred by Dr. Ubaldo Glassing (dermatologist), in consultation for high testosterone level and hirsutism.  Patient mentions a history of of hirsutism and hair loss (on scalp) - in latest 2-3 years. She does not have deepening of her voice, but voice is hoarser. She will see ObGyn next month for a pelvic exam. When she washes, she noticed an enlargement of labia. + more sweating in last year.  A total testosterone level and the DHEAS was checked and this returned elevated. No records available for now.  Hirsutism:  - started 2-3 years ago  - nose, chin, arms, legs - has not been on HRT  - only on face, most on chin  - she is plucking or but shaves occasionally  Hair loss:  - hair getting thinner on scalp - she is not coloring her hair   Virilization:  - no increased libido  - denies thickening of her voice  - Denies increased muscularity   Acne:  - no  Age of menopause: ~70 y/o   Fertility/Menstrual cycles:  - no h/o irregular menses  - no h/o ovarian cysts  - children: 1 - miscarriages: no  No h/o breast, endometrial or cervical CA. MGM: breast CA.  On Evista - for last 2 years..   She had steroid inj in the past: last in 2016. She had routine steroid inj 7-8 years ago.  - Last thyroid test:  Lab Results  Component Value Date   TSH 2.110 09/22/2013   - Last set of lipids:  Lab Results  Component Value Date   CHOL 198 11/24/2015   HDL 58 11/24/2015   LDLCALC 138 (H) 08/11/2015   TRIG 150 (H) 11/24/2015   CHOLHDL 3.9 08/11/2015   - Last HbA1c:  No results found for: HGBA1C  Pt. also has a history of HTN, HL, OP (on Evista), OA, vitamin D def.  ROS: Constitutional: no weight gain/loss, no fatigue, + subjective hyperthermi Eyes: no blurry vision, no xerophthalmia ENT: no sore throat, no nodules palpated in throat, no  dysphagia/odynophagia, + Tinnitus, + hoarseness Cardiovascular: no CP/SOB/palpitations/leg swelling Respiratory: no cough/SOB Gastrointestinal: no N/V/D/C/ + occasional heartburn Musculoskeletal: no muscle/+ joint aches Skin: no rashes, + increased hair on chin, no acne; + hair tinning on scalp, + easy bruising Neurological: no tremors/numbness/tingling/dizziness Psychiatric: no depression/anxiety  Past Medical History:  Diagnosis Date  . Arthritis   . Hypertension    Past Surgical History:  Procedure Laterality Date  . COLONOSCOPY    . FACIAL COSMETIC SURGERY    . FINGER TENDON REPAIR     middle rt hand-  . HAMMER TOE SURGERY  12/27/2011   Procedure: HAMMER TOE CORRECTION;  Surgeon: Wylene Simmer, MD;  Location: [facility];  Service: Orthopedics;  Laterality: Left;  2-4th   Social History   Social History  . Marital status: Divorced    Spouse name: N/A  . Number of children: 1   Occupational History  . retired   Social History Main Topics  . Smoking status: Former Smoker    Quit date: 12/21/1975  . Smokeless tobacco: Never Used  . Alcohol use Yes     Comment: occ - 2-3x a week, 1 wine drink  . Drug use: No   Current Outpatient Prescriptions on File Prior to Visit  Medication Sig Dispense Refill  .  aspirin 81 MG tablet Take 81 mg by mouth daily.    . calcium carbonate (OS-CAL) 600 MG TABS tablet Take 600 mg by mouth daily with breakfast.     . cholecalciferol (VITAMIN D) 1000 UNITS tablet Take 1,000 Units by mouth daily.    . diclofenac (VOLTAREN) 75 MG EC tablet Take 1 tablet (75 mg total) by mouth 2 (two) times daily. 60 tablet 6  . hydrochlorothiazide (MICROZIDE) 12.5 MG capsule Take 1 capsule (12.5 mg total) by mouth daily. 30 capsule 11  . losartan (COZAAR) 50 MG tablet Take 1 tablet (50 mg total) by mouth daily. 30 tablet 11  . raloxifene (EVISTA) 60 MG tablet Take 1 tablet (60 mg total) by mouth daily. 30 tablet 3   No current facility-administered  medications on file prior to visit.    No Known Allergies Family History  Problem Relation Age of Onset  . Colon cancer Father   . Esophageal cancer Neg Hx   . Rectal cancer Neg Hx   . Stomach cancer Neg Hx     PE: BP 118/80 (BP Location: Left Arm, Patient Position: Sitting)   Pulse 72   Ht 5' 2.5" (1.588 m)   Wt 130 lb (59 kg)   SpO2 95%   BMI 23.40 kg/m  Wt Readings from Last 3 Encounters:  03/12/16 130 lb (59 kg)  11/24/15 129 lb (58.5 kg)  08/11/15 129 lb (58.5 kg)   Constitutional: Normal weight, in NAD, no full supraclavicular fat pads Eyes: PERRLA, EOMI, no exophthalmos ENT: moist mucous membranes, no thyromegaly, no cervical lymphadenopathy Cardiovascular: RRR, No MRG Respiratory: CTA B Gastrointestinal: abdomen soft, NT, ND, BS+ Musculoskeletal: no deformities, strength intact in all 4 Skin: moist, warm, no rashes; dark terminal hair on chin, and coarse hair on sideburns and arms, no acne, + female pattern baldness Neurological: no tremor with outstretched hands, DTR normal in all 4  ASSESSMENT: 1. High testosterone level in a postmenopausal woman  PLAN:  1. I had a long discussion with the patient about the fact that the high testosterone level in a postmenopausal woman can be secondary to insulin resistance or previous undiagnosed PCOS, or can be a new occurrence in the setting of adrenal tumors, ovarian tumors or ovarian hyperthecosis. The latter 3 conditions usually occur with higher levels of testosterone, > 150 ng/mL.  - we will need to repeat her testosterone level, including a total and a free level. Will also add the following:  DHEAS  17 hydroxyprogesterone - to r/o [facility] Androstenedione LH, FSH  - although these will be comfounded by Evista ACTH and cortisol >> if abnormal, we will need a 24-hour urine cortisol If the above tests are abnormal, we might need to do a CT of the abdomen and pelvis and if this is negative for an adrenal or ovarian mass, we  may need a pelvic ultrasound (focus on the ovarian volume, normally 22+/-1.4 cm3 in women>15 years postmenopausal) to diagnose ovarian hyperthecosis.  - We discussed about treatment of the high testosterone level if this is secondary to insulin resistance or undiagnosed PCOS, by possibly using spironolactone, however, she may need to have surgery if an ovarian/adrenal mass and sometimes even for hyperthecosis. She would want to avoid extensive investigation or surgery. In this case, we decided to rule out ominous diagnosis and use spironolactone to lower the testosterone. - if we start spironolactone, she will need to return in approximately a week for a nurse visit for blood pressure and also  for lab visit to check her potassium. We may need to stop her ARB if we start spironolactone.  - I will reevaluate her in 4 months.   Orders Placed This Encounter  Procedures  . Follicle stimulating hormone  . Luteinizing hormone  . 17-Hydroxyprogesterone  . Androstenedione  . Testosterone, Free, Total, SHBG  . Cortisol  . ACTH   Component     Latest Ref Rng & Units 03/14/2016 03/14/2016        10:08 AM 10:08 AM  Testosterone     3 - 41 ng/dL 68 (H) 61 (H)  Testosterone Free     0.0 - 4.2 pg/mL 11.7 (H) 11.6 (H)  Sex Horm Binding Glob, Serum     17.3 - 125.0 nmol/L 47.4 47.1  FSH     mIU/ML 85.9   LH     mIU/mL 53.46   17-OH-Progesterone, LC/MS/MS    (<45 ng/dL) 55   Androstenedione     20-75 ng/dL 143   Cortisol, Plasma     4.3-22.4 ug/dL 28.6   C206 ACTH     6 - 50 pg/mL 24    17 hydroxyprogesterone is normal, ruling out CAH. Testosterone, Cortisol, and androstenedione levels are high. ACTH is normal. FSH and LH are appropriately elevated.  We'll go ahead with an adrenal CT (will include pelvis to investigate ovaries).   Addendum: CT ABDOMEN PELVIS W CONTRAST  Order: [id number]  Status:  Final result Visible to patient:  No (Not Released) Dx:  Elevated testosterone level in  female  Details   Reading Physician Reading Date Result Priority  Sharyn Blitz, MD 05/16/2016   Narrative    CLINICAL DATA: 70 year old female with hyperandrogenism.  EXAM: CT ABDOMEN AND PELVIS WITH CONTRAST  TECHNIQUE: Multidetector CT imaging of the abdomen and pelvis was performed using the standard protocol following bolus administration of intravenous contrast.  CONTRAST: 121mL ISOVUE-300 IOPAMIDOL (ISOVUE-300) INJECTION 61%  COMPARISON: None.  FINDINGS: Lower chest: Hypoventilatory changes are seen in the dependent lung bases.  Hepatobiliary: Normal liver size. Hypodense 0.5 cm segment 8 right liver lobe lesion, too small to characterize, for which no further follow-up is required. No additional liver lesions, noting exclusion of a tiny portion of the liver dome from this study. Normal gallbladder with no radiopaque cholelithiasis. No biliary ductal dilatation.  Pancreas: Normal, with no mass or duct dilation.  Spleen: Normal size. No mass.  Adrenals/Urinary Tract: Mild diffuse thickening of both adrenal glands without discrete adrenal nodules, suggestive of adrenal hyperplasia. Hypodense 1.1 cm renal cortical lesion in the posterior upper right kidney (series 2/image 17). Additional subcentimeter hypodense renal cortical lesions are seen scattered throughout both kidneys, too small to characterize. There are numerous small simple parapelvic renal cysts in both kidneys. No hydronephrosis. Normal bladder.  Stomach/Bowel: Grossly normal stomach. There is a 2.9 x 2.2 cm soft tissue density focus in the descending duodenum (series 2/ image 24), which is indeterminate, with a duodenal mass not excluded. There is a probable large adjacent duodenal diverticulum lateral to the descending duodenum (series 2/image 20). Normal caliber small bowel. No additional sites of small bowel wall thickening. Normal appendix. No large bowel wall thickening or pericolonic  fat stranding. Mild-to-moderate stool throughout the colon.  Vascular/Lymphatic: Atherosclerotic nonaneurysmal abdominal aorta . Patent portal, splenic, hepatic and renal veins. No pathologically enlarged lymph nodes in the abdomen or pelvis.  Reproductive: There is a vague low-attenuation focus in the anterior cervix measuring 1.9 x 1.5 cm (series 2/image 69). Otherwise  unremarkable uterus. No adnexal mass.  Other: No pneumoperitoneum, ascites or focal fluid collection.  Musculoskeletal: No aggressive appearing focal osseous lesions. Nonspecific small sclerotic lesion in the upper sacrum, probably a benign bone island. Marked thoracolumbar spondylosis.  IMPRESSION: 1. Mild diffuse thickening of both adrenal glands without discrete adrenal nodules, suggestive of adrenal hyperplasia. 2. Indeterminate soft tissue density focus in the descending duodenum. Although this could represent dense intraluminal contents, a duodenal mass is not excluded. MRI of the abdomen without and with IV contrast may be useful for further characterization. Upper endoscopy should be considered. 3. No adnexal mass. 4. Vague low-attenuation focus in the anterior cervix, which could represent normal variant differential enhancement, with a cervical mass not excluded. Recommend correlation with transabdominal and transvaginal pelvic ultrasound, clinical pelvic examination and Pap smear. 5. Aortic atherosclerosis.   Electronically Signed By: Ilona Sorrel M.D. On: 05/16/2016 16:30       No ovarian mass. Bilateral adrenal hyperplasia. We can start Spironolactone 50 mg daily and will advise her to cut Cozaar in 1/2. We'll have her come back for labs in 1 week after starting spironolactone and will also check a blood pressure level at that time. Possible duodenal mass >> I will send this to pt's PCP.  Philemon Kingdom, MD PhD [facility]

## 2016-03-12 NOTE — Patient Instructions (Addendum)
Please come back for labs at 8-9 am.  Please come back for a follow-up appointment in 4 months.

## 2016-03-14 ENCOUNTER — Other Ambulatory Visit (INDEPENDENT_AMBULATORY_CARE_PROVIDER_SITE_OTHER): Payer: Medicare Other

## 2016-03-14 DIAGNOSIS — E349 Endocrine disorder, unspecified: Secondary | ICD-10-CM | POA: Diagnosis not present

## 2016-03-14 DIAGNOSIS — R7989 Other specified abnormal findings of blood chemistry: Secondary | ICD-10-CM

## 2016-03-14 DIAGNOSIS — L680 Hirsutism: Secondary | ICD-10-CM | POA: Diagnosis not present

## 2016-03-14 LAB — LUTEINIZING HORMONE: LH: 53.46 m[IU]/mL

## 2016-03-14 LAB — FOLLICLE STIMULATING HORMONE: FSH: 85.9 m[IU]/mL

## 2016-03-14 LAB — CORTISOL: Cortisol, Plasma: 28.6 ug/dL

## 2016-03-15 LAB — TESTOSTERONE, FREE, TOTAL, SHBG
SEX HORMONE BINDING: 47.1 nmol/L (ref 17.3–125.0)
Sex Hormone Binding: 47.4 nmol/L (ref 17.3–125.0)
TESTOSTERONE FREE: 11.6 pg/mL — AB (ref 0.0–4.2)
TESTOSTERONE: 68 ng/dL — AB (ref 3–41)
Testosterone, Free: 11.7 pg/mL — ABNORMAL HIGH (ref 0.0–4.2)
Testosterone: 61 ng/dL — ABNORMAL HIGH (ref 3–41)

## 2016-03-16 LAB — ACTH: C206 ACTH: 24 pg/mL (ref 6–50)

## 2016-03-17 LAB — 17-HYDROXYPROGESTERONE: 17-OH-PROGESTERONE, LC/MS/MS: 55 ng/dL

## 2016-03-20 LAB — ANDROSTENEDIONE: ANDROSTENEDIONE: 143 ng/dL

## 2016-03-22 ENCOUNTER — Telehealth: Payer: Self-pay

## 2016-03-22 NOTE — Telephone Encounter (Signed)
Sounds good. Thank you

## 2016-03-22 NOTE — Progress Notes (Signed)
Sorry, this came to me instead of my sister-in-law, Rolm Bookbinder MD, who is with [facility]. Thank you

## 2016-03-22 NOTE — Telephone Encounter (Signed)
Called and discussed with patient about lab results, she does want to go ahead and have the CT done after talking with her she is worried about it, but feels if that what you would like for her to do, then she will think about it and let them contact her about an appointment.

## 2016-04-03 ENCOUNTER — Ambulatory Visit: Payer: Medicare Other | Admitting: Family Medicine

## 2016-04-17 ENCOUNTER — Encounter: Payer: Self-pay | Admitting: Family Medicine

## 2016-04-17 ENCOUNTER — Ambulatory Visit (INDEPENDENT_AMBULATORY_CARE_PROVIDER_SITE_OTHER): Payer: Medicare Other | Admitting: Family Medicine

## 2016-04-17 VITALS — BP 130/87 | HR 76 | Temp 97.2°F | Ht 62.5 in | Wt 129.0 lb

## 2016-04-17 DIAGNOSIS — R05 Cough: Secondary | ICD-10-CM | POA: Diagnosis not present

## 2016-04-17 DIAGNOSIS — I10 Essential (primary) hypertension: Secondary | ICD-10-CM | POA: Diagnosis not present

## 2016-04-17 DIAGNOSIS — E785 Hyperlipidemia, unspecified: Secondary | ICD-10-CM

## 2016-04-17 DIAGNOSIS — M150 Primary generalized (osteo)arthritis: Secondary | ICD-10-CM | POA: Diagnosis not present

## 2016-04-17 DIAGNOSIS — R2681 Unsteadiness on feet: Secondary | ICD-10-CM

## 2016-04-17 DIAGNOSIS — M4306 Spondylolysis, lumbar region: Secondary | ICD-10-CM

## 2016-04-17 DIAGNOSIS — R059 Cough, unspecified: Secondary | ICD-10-CM

## 2016-04-17 DIAGNOSIS — Z23 Encounter for immunization: Secondary | ICD-10-CM

## 2016-04-17 DIAGNOSIS — M159 Polyosteoarthritis, unspecified: Secondary | ICD-10-CM

## 2016-04-17 DIAGNOSIS — E559 Vitamin D deficiency, unspecified: Secondary | ICD-10-CM

## 2016-04-17 NOTE — Patient Instructions (Addendum)
Medicare Annual Wellness Visit  [facility] and the medical providers at [facility] strive to bring you the best medical care.  In doing so we not only want to address your current medical conditions and concerns but also to detect new conditions early and prevent illness, disease and health-related problems.    Medicare offers a yearly Wellness Visit which allows our clinical staff to assess your need for preventative services including immunizations, lifestyle education, counseling to decrease risk of preventable diseases and screening for fall risk and other medical concerns.    This visit is provided free of charge (no copay) for all Medicare recipients. The clinical pharmacists at [facility] have begun to conduct these Wellness Visits which will also include a thorough review of all your medications.    As you primary medical provider recommend that you make an appointment for your Annual Wellness Visit if you have not done so already this year.  You may set up this appointment before you leave today or you may call back [phone] and schedule an appointment.  Please make sure when you call that you mention that you are scheduling your Annual Wellness Visit with the clinical pharmacist so that the appointment may be made for the proper length of time.    Continue current medications. Continue good therapeutic lifestyle changes which include good diet and exercise. Fall precautions discussed with patient. If an FOBT was given today- please return it to our front desk. If you are over 86 years old - you may need Prevnar 55 or the adult Pneumonia vaccine.  **Flu shots are available--- please call and schedule a FLU-CLINIC appointment**  After your visit with [place] today you will receive a survey in the mail or online from [employer] regarding your care with [place]. Please take a moment to fill this out. Your feedback is very  important to [place] as you can help [place] better understand your patient needs as well as improve your experience and satisfaction. WE CARE ABOUT YOU!!!   Because of the balance issues with walking, the patient should have a follow-up visit with neurosurgery to see if possibly some injections in the back may help strengthen her lower extremities more. She should continue to be careful not put herself at risk for falling Because of the constipation and cough she should drink more fluids and stay well hydrated She should discontinue the use of the fan at night as this dries the airways out more The flu shot that she received today may make her arm sore Continue to monitor blood pressures at home Do not forget to get your mammogram

## 2016-04-17 NOTE — Progress Notes (Signed)
Subjective:    Patient ID: Lynn [name], female    DOB: [birth year], 70 y.o.   MRN: [id number]  HPI  Pt here for follow up and management of chronic medical problems which includes hyperlipidemia and hypertension. She is taking medications regularly.The patient still has balance issues. She has seen the neurosurgeon on several occasions. She has severe degenerative spondylitic changes at multiple levels in the lumbosacral spine. The patient also has a history of a right foot fracture in October 2016. She also complains of some problems with sleep. She is due to get her mammogram is set she will schedule this soon. She'll get lab work today and will receive her flu shot today.    Patient Active Problem List   Diagnosis Date Noted  . Incontinence in female 06/25/2014  . Depression 06/25/2014  . Hearing deficit 06/25/2014  . Osteoporosis 06/24/2013  . Hypertension 05/11/2013  . Osteoarthritis 05/11/2013  . Vitamin D deficiency 05/11/2013  . Personal history of colonic polyps 05/11/2013  . Hyperlipidemia 05/11/2013   Outpatient Encounter Prescriptions as of 04/17/2016  Medication Sig  . aspirin 81 MG tablet Take 81 mg by mouth daily.  . calcium carbonate (OS-CAL) 600 MG TABS tablet Take 600 mg by mouth daily with breakfast.   . cholecalciferol (VITAMIN D) 1000 UNITS tablet Take 1,000 Units by mouth daily.  . diclofenac (VOLTAREN) 75 MG EC tablet Take 1 tablet (75 mg total) by mouth 2 (two) times daily.  . hydrochlorothiazide (MICROZIDE) 12.5 MG capsule Take 1 capsule (12.5 mg total) by mouth daily.  Marland Kitchen losartan (COZAAR) 50 MG tablet Take 1 tablet (50 mg total) by mouth daily.  . raloxifene (EVISTA) 60 MG tablet Take 1 tablet (60 mg total) by mouth daily.  . RESTASIS 0.05 % ophthalmic emulsion INSTILL 1 DROP OU BID   No facility-administered encounter medications on file as of 04/17/2016.      Review of Systems  Constitutional: Negative.        Trouble falling asleep  HENT:  Negative.   Eyes: Negative.   Respiratory: Negative.   Cardiovascular: Negative.   Gastrointestinal: Negative.   Endocrine: Negative.   Genitourinary: Negative.   Musculoskeletal: Negative.   Skin: Negative.   Allergic/Immunologic: Negative.   Neurological: Positive for numbness (bilateral feet).       Still having some balance issues  Hematological: Negative.   Psychiatric/Behavioral: Negative.        Objective:   Physical Exam  Constitutional: She is oriented to person, place, and time. She appears well-developed and well-nourished. No distress.  Pleasant and alert  HENT:  Head: Normocephalic and atraumatic.  Right Ear: External ear normal.  Left Ear: External ear normal.  Nose: Nose normal.  Mouth/Throat: Oropharynx is clear and moist.  Eyes: Conjunctivae and EOM are normal. Pupils are equal, round, and reactive to light. Right eye exhibits no discharge. Left eye exhibits no discharge. No scleral icterus.  Neck: Normal range of motion. Neck supple. No thyromegaly present.  No bruits thyromegaly or anterior cervical adenopathy  Cardiovascular: Normal rate, regular rhythm, normal heart sounds and intact distal pulses.   No murmur heard. Heart had a regular rate and rhythm at 72/m  Pulmonary/Chest: Effort normal and breath sounds normal. No respiratory distress. She has no wheezes. She has no rales. She exhibits no tenderness.  Clear anteriorly and posteriorly and no wheezes or rhonchi  Abdominal: Soft. Bowel sounds are normal. She exhibits no mass. There is no tenderness. There is no rebound and  no guarding.  No abdominal tenderness masses or organ enlargement  Musculoskeletal: Normal range of motion. She exhibits no edema.  With leg raising bilaterally against pressure there was some back pain.  Lymphadenopathy:    She has no cervical adenopathy.  Neurological: She is alert and oriented to person, place, and time. She has normal reflexes. No cranial nerve deficit.  Lower  extremity reflexes were diminished bilaterally  Skin: Skin is warm and dry. No rash noted.  Psychiatric: She has a normal mood and affect. Her behavior is normal. Judgment and thought content normal.  Nursing note and vitals reviewed.   BP 130/87 (BP Location: Left Arm)   Pulse 76   Temp 97.2 F (36.2 C) (Oral)   Ht 5' 2.5" (1.588 m)   Wt 129 lb (58.5 kg)   BMI 23.22 kg/m        Assessment & Plan:  1. Vitamin D deficiency -Continue current treatment pending results of lab work - CBC with Differential/Platelet - VITAMIN D 25 Hydroxy (Vit-D Deficiency, Fractures)  2. Essential hypertension -The blood pressure is good today and she will continue with current treatment and sodium restriction and weight control - BMP8+EGFR - CBC with Differential/Platelet - Hepatic function panel  3. Hyperlipidemia -Continue with aggressive therapeutic lifestyle changes which is diet and exercise - CBC with Differential/Platelet - Lipid panel  4. Encounter for immunization - Flu Vaccine QUAD 36+ mos IM  5. Gait instability -The patient has lumbosacral spondylosis at multiple joints. If she continues to have problems, she should take a return visit with neurosurgery for further evaluation  6. Cough -Drink plenty of fluids and discontinue overhead fan use  7. Primary osteoarthritis involving multiple joints -Continue with Voltaren or diclofenac making sure that you monitor blood pressures closely and any irritation of the stomach. Always make sure you take this medicine after eating  8. Spondylolysis of lumbar region -Continue with diclofenac after eating and arrange a follow-up appointment with neurosurgery  Patient Instructions                       Medicare Annual Wellness Visit  [facility] and the medical providers at [facility] strive to bring you the best medical care.  In doing so we not only want to address your current medical conditions and concerns but  also to detect new conditions early and prevent illness, disease and health-related problems.    Medicare offers a yearly Wellness Visit which allows our clinical staff to assess your need for preventative services including immunizations, lifestyle education, counseling to decrease risk of preventable diseases and screening for fall risk and other medical concerns.    This visit is provided free of charge (no copay) for all Medicare recipients. The clinical pharmacists at [facility] have begun to conduct these Wellness Visits which will also include a thorough review of all your medications.    As you primary medical provider recommend that you make an appointment for your Annual Wellness Visit if you have not done so already this year.  You may set up this appointment before you leave today or you may call back [phone] and schedule an appointment.  Please make sure when you call that you mention that you are scheduling your Annual Wellness Visit with the clinical pharmacist so that the appointment may be made for the proper length of time.    Continue current medications. Continue good therapeutic lifestyle changes which include good diet  and exercise. Fall precautions discussed with patient. If an FOBT was given today- please return it to our front desk. If you are over 68 years old - you may need Prevnar 52 or the adult Pneumonia vaccine.  **Flu shots are available--- please call and schedule a FLU-CLINIC appointment**  After your visit with [place] today you will receive a survey in the mail or online from [employer] regarding your care with [place]. Please take a moment to fill this out. Your feedback is very important to [place] as you can help [place] better understand your patient needs as well as improve your experience and satisfaction. WE CARE ABOUT YOU!!!   Because of the balance issues with walking, the patient should have a follow-up visit with neurosurgery to see if possibly  some injections in the back may help strengthen her lower extremities more. She should continue to be careful not put herself at risk for falling Because of the constipation and cough she should drink more fluids and stay well hydrated She should discontinue the use of the fan at night as this dries the airways out more The flu shot that she received today may make her arm sore Continue to monitor blood pressures at home Do not forget to get your mammogram    Arrie Senate MD

## 2016-04-18 ENCOUNTER — Telehealth: Payer: Self-pay | Admitting: Family Medicine

## 2016-04-18 LAB — CBC WITH DIFFERENTIAL/PLATELET
BASOS ABS: 0 10*3/uL (ref 0.0–0.2)
BASOS: 0 %
EOS (ABSOLUTE): 0.1 10*3/uL (ref 0.0–0.4)
Eos: 1 %
HEMOGLOBIN: 15 g/dL (ref 11.1–15.9)
Hematocrit: 42.9 % (ref 34.0–46.6)
IMMATURE GRANS (ABS): 0.1 10*3/uL (ref 0.0–0.1)
Immature Granulocytes: 1 %
LYMPHS ABS: 1.2 10*3/uL (ref 0.7–3.1)
LYMPHS: 11 %
MCH: 32.8 pg (ref 26.6–33.0)
MCHC: 35 g/dL (ref 31.5–35.7)
MCV: 94 fL (ref 79–97)
MONOCYTES: 10 %
Monocytes Absolute: 1.1 10*3/uL — ABNORMAL HIGH (ref 0.1–0.9)
NEUTROS ABS: 8.7 10*3/uL — AB (ref 1.4–7.0)
Neutrophils: 77 %
Platelets: 334 10*3/uL (ref 150–379)
RBC: 4.57 x10E6/uL (ref 3.77–5.28)
RDW: 13.1 % (ref 12.3–15.4)
WBC: 11.1 10*3/uL — ABNORMAL HIGH (ref 3.4–10.8)

## 2016-04-18 LAB — BMP8+EGFR
BUN / CREAT RATIO: 29 — AB (ref 12–28)
BUN: 31 mg/dL — ABNORMAL HIGH (ref 8–27)
CALCIUM: 10 mg/dL (ref 8.7–10.3)
CHLORIDE: 95 mmol/L — AB (ref 96–106)
CO2: 26 mmol/L (ref 18–29)
Creatinine, Ser: 1.08 mg/dL — ABNORMAL HIGH (ref 0.57–1.00)
GFR, EST AFRICAN AMERICAN: 60 mL/min/{1.73_m2} (ref >59)
GFR, EST NON AFRICAN AMERICAN: 52 mL/min/{1.73_m2} — AB (ref >59)
Glucose: 85 mg/dL (ref 65–99)
POTASSIUM: 4.2 mmol/L (ref 3.5–5.2)
Sodium: 136 mmol/L (ref 134–144)

## 2016-04-18 LAB — HEPATIC FUNCTION PANEL
ALK PHOS: 74 IU/L (ref 39–117)
ALT: 18 IU/L (ref 0–32)
AST: 15 IU/L (ref 0–40)
Albumin: 4.1 g/dL (ref 3.5–4.8)
BILIRUBIN, DIRECT: 0.14 mg/dL (ref 0.00–0.40)
Bilirubin Total: 0.6 mg/dL (ref 0.0–1.2)
Total Protein: 6.4 g/dL (ref 6.0–8.5)

## 2016-04-18 LAB — VITAMIN D 25 HYDROXY (VIT D DEFICIENCY, FRACTURES): VIT D 25 HYDROXY: 33.5 ng/mL (ref 30.0–100.0)

## 2016-04-18 LAB — LIPID PANEL
CHOLESTEROL TOTAL: 198 mg/dL (ref 100–199)
Chol/HDL Ratio: 3.4 ratio units (ref 0.0–4.4)
HDL: 58 mg/dL (ref >39)
LDL Calculated: 104 mg/dL — ABNORMAL HIGH (ref 0–99)
TRIGLYCERIDES: 179 mg/dL — AB (ref 0–149)
VLDL Cholesterol Cal: 36 mg/dL (ref 5–40)

## 2016-04-18 NOTE — Telephone Encounter (Signed)
Patient aware of lab results and requested a copy mailed to her. Mailed copy of labs.

## 2016-04-24 ENCOUNTER — Encounter: Payer: Self-pay | Admitting: Nurse Practitioner

## 2016-04-24 ENCOUNTER — Ambulatory Visit (INDEPENDENT_AMBULATORY_CARE_PROVIDER_SITE_OTHER): Payer: Medicare Other | Admitting: Nurse Practitioner

## 2016-04-24 VITALS — BP 127/84 | HR 78 | Temp 97.2°F | Ht 62.0 in | Wt 130.0 lb

## 2016-04-24 DIAGNOSIS — Z01419 Encounter for gynecological examination (general) (routine) without abnormal findings: Secondary | ICD-10-CM

## 2016-04-24 NOTE — Patient Instructions (Signed)
Pap Test WHY AM I HAVING THIS TEST? A pap test is sometimes called a pap smear. It is a screening test that is used to check for signs of cancer of the vagina, cervix, and uterus. The test can also identify the presence of infection or precancerous changes. Your health care provider will likely recommend you have this test done on a regular basis. This test may be done:  Every 3 years, starting at age 71.  Every 5 years, in combination with testing for the presence of human papillomavirus (HPV).  More or less often depending on other medical conditions.  WHAT KIND OF SAMPLE IS TAKEN? Using a small cotton swab, plastic spatula, or brush, your health care provider will collect a sample of cells from the surface of your cervix. Your cervix is the opening to your uterus, also called a womb. Secretions from the cervix and vagina may also be collected. HOW DO I PREPARE FOR THE TEST?  Be aware of where you are in your menstrual cycle. You may be asked to reschedule the test if you are menstruating on the day of the test.  You may need to reschedule if you have a known vaginal infection on the day of the test.  You may be asked to avoid douching or taking a bath the day before or the day of the test.  Some medicines can cause abnormal test results, such as digitalis and tetracycline. Talk with your health care provider before your test if you take one of these medicines. WHAT DO THE RESULTS MEAN? Abnormal test results may indicate a number of health conditions. These may include:  Cancer. Although pap test results cannot be used to diagnose cancer of the cervix, vagina, or uterus, they may suggest the possibility of cancer. Further tests would be required to determine if cancer is present.  Sexually transmitted disease.  Fungal infection.  Parasite infection.  Herpes infection.  A condition causing or contributing to infertility. It is your responsibility to obtain your test results. Ask  the lab or department performing the test when and how you will get your results. Contact your health care provider to discuss any questions you have about your results.   This information is not intended to replace advice given to you by your health care provider. Make sure you discuss any questions you have with your health care provider.   Document Released: 09/29/2002 Document Revised: 07/30/2014 Document Reviewed: 11/30/2013 Elsevier Interactive Patient Education 2016 Elsevier Inc.  

## 2016-04-24 NOTE — Progress Notes (Signed)
   Subjective:    Patient ID: Lynn [name], female    DOB: [birth year], 70 y.o.   MRN: [id number]  HPI Patient comes in today for pap and breast only - she is a regular patient of Dr. Laurance Flatten that had her last follow up on 04/17/16. She is doing well today without complaints.    Review of Systems  Constitutional: Negative.   HENT: Negative.   Respiratory: Negative.   Cardiovascular: Negative.   Gastrointestinal: Negative.   Genitourinary: Negative.   Neurological: Negative.   Psychiatric/Behavioral: Negative.   All other systems reviewed and are negative.      Objective:   Physical Exam  Constitutional: She is oriented to person, place, and time. She appears well-developed and well-nourished.  HENT:  Head: Normocephalic.  Right Ear: Hearing, tympanic membrane, external ear and ear canal normal.  Left Ear: Hearing, tympanic membrane, external ear and ear canal normal.  Nose: Nose normal.  Mouth/Throat: Uvula is midline and oropharynx is clear and moist.  Eyes: Conjunctivae and EOM are normal. Pupils are equal, round, and reactive to light.  Neck: Normal range of motion and full passive range of motion without pain. Neck supple. No JVD present. Carotid bruit is not present. No thyroid mass and no thyromegaly present.  Cardiovascular: Normal rate, normal heart sounds and intact distal pulses.   No murmur heard. Pulmonary/Chest: Effort normal and breath sounds normal. Right breast exhibits no inverted nipple, no mass, no nipple discharge, no skin change and no tenderness. Left breast exhibits no inverted nipple, no mass, no nipple discharge, no skin change and no tenderness.  Abdominal: Soft. Bowel sounds are normal. She exhibits no mass. There is no tenderness.  Genitourinary: Vagina normal and uterus normal. No breast swelling, tenderness, discharge or bleeding. No vaginal discharge found.  Genitourinary Comments: bimanual exam-No adnexal masses or tenderness. Cervical  stenosis Mild uterine prolapse  Musculoskeletal: Normal range of motion.  Lymphadenopathy:    She has no cervical adenopathy.  Neurological: She is alert and oriented to person, place, and time.  Skin: Skin is warm and dry.  Psychiatric: She has a normal mood and affect. Her behavior is normal. Judgment and thought content normal.    BP 127/84   Pulse 78   Temp 97.2 F (36.2 C) (Oral)   Ht 5\' 2"  (1.575 m)   Wt 130 lb (59 kg)   BMI 23.78 kg/m        Assessment & Plan:  1. Encounter for gynecological examination without abnormal finding Keep follow up appointment with Dr. Laurance Flatten Continue all meds - Pap IG (Image Guided)  Mary-Margaret Hassell Done, FNP

## 2016-04-30 LAB — PAP IG (IMAGE GUIDED): PAP Smear Comment: 0

## 2016-05-09 ENCOUNTER — Telehealth: Payer: Self-pay | Admitting: Family Medicine

## 2016-05-09 NOTE — Telephone Encounter (Signed)
Did have some cells of unclear significance. We repeat in one year with that as was normal in 2015.

## 2016-05-09 NOTE — Telephone Encounter (Signed)
Aware. 

## 2016-05-09 NOTE — Telephone Encounter (Signed)
Please review pap results and advise.

## 2016-05-16 ENCOUNTER — Other Ambulatory Visit: Payer: Medicare Other

## 2016-05-16 ENCOUNTER — Other Ambulatory Visit: Payer: Self-pay

## 2016-05-16 ENCOUNTER — Other Ambulatory Visit: Payer: Self-pay | Admitting: Internal Medicine

## 2016-05-16 ENCOUNTER — Ambulatory Visit
Admission: RE | Admit: 2016-05-16 | Discharge: 2016-05-16 | Disposition: A | Discharge status: Home / Self Care | Payer: Medicare Other | Source: Ambulatory Visit | Attending: Internal Medicine | Admitting: Internal Medicine

## 2016-05-16 ENCOUNTER — Telehealth: Payer: Self-pay

## 2016-05-16 DIAGNOSIS — R7989 Other specified abnormal findings of blood chemistry: Secondary | ICD-10-CM

## 2016-05-16 MED ORDER — IOPAMIDOL (ISOVUE-300) INJECTION 61%
100.0000 mL | Freq: Once | INTRAVENOUS | Status: AC | PRN
  Administered 2016-05-16: 100 mL via INTRAVENOUS

## 2016-05-16 NOTE — Telephone Encounter (Signed)
[facility] called regarding the imaging order for the CT abdomen pelvis w/wo contrast. Dr.Gherghe not here at the time, and they had patient there with IV started and needed to know if it was with or without contrast. I went to take advisement from covering Dr. Lanetta Inch advised that he would do it with contrast, so that is what I advised [facility] to do at this time and we wrote the order. Will advise Dr.Gherghe when she returns.

## 2016-05-17 ENCOUNTER — Other Ambulatory Visit (INDEPENDENT_AMBULATORY_CARE_PROVIDER_SITE_OTHER): Payer: Medicare Other

## 2016-05-17 DIAGNOSIS — R7989 Other specified abnormal findings of blood chemistry: Secondary | ICD-10-CM

## 2016-05-18 ENCOUNTER — Telehealth: Payer: Self-pay

## 2016-05-18 ENCOUNTER — Other Ambulatory Visit: Payer: Self-pay | Admitting: *Deleted

## 2016-05-18 DIAGNOSIS — K3189 Other diseases of stomach and duodenum: Secondary | ICD-10-CM

## 2016-05-18 LAB — BMP8+EGFR
BUN / CREAT RATIO: 20 (ref 12–28)
BUN: 18 mg/dL (ref 8–27)
CHLORIDE: 100 mmol/L (ref 96–106)
CO2: 25 mmol/L (ref 18–29)
CREATININE: 0.89 mg/dL (ref 0.57–1.00)
Calcium: 9.6 mg/dL (ref 8.7–10.3)
GFR calc non Af Amer: 66 mL/min/{1.73_m2} (ref >59)
GFR, EST AFRICAN AMERICAN: 76 mL/min/{1.73_m2} (ref >59)
Glucose: 90 mg/dL (ref 65–99)
Potassium: 4.1 mmol/L (ref 3.5–5.2)
Sodium: 139 mmol/L (ref 134–144)

## 2016-05-18 MED ORDER — SPIRONOLACTONE 25 MG PO TABS: 50.0000 mg | ORAL_TABLET | Freq: Every day | ORAL | 2 refills | Status: DC

## 2016-05-18 NOTE — Progress Notes (Signed)
In response to the note from Dr. Cruzita Lederer, please schedule an MRI of the abdomen without and with IV contrast to further characterize the possibility of a duodenal mass. Also schedule her to visit the gastroenterologist for possible endoscopy. I would assume that Dr. Cruzita Lederer has discussed this report with the patient. Please follow through with getting the MRI and doing a referral to a gastroenterologist.

## 2016-05-18 NOTE — Telephone Encounter (Signed)
Called patient and gave lab results. Patient was advised to call us with questions as she did not have anything to write down all of this at this time. I advised of the new medication and the change in the losartan; I also advised that her PCP was sent a copy as they may need do a MRI of her duodenum as lesions were seen on that.

## 2016-05-18 NOTE — Addendum Note (Signed)
Addended by: Philemon Kingdom on: 05/18/2016 12:37 PM   Modules accepted: Orders

## 2016-05-18 NOTE — Progress Notes (Signed)
Order placed for MRI Abdomen without contrast

## 2016-05-28 ENCOUNTER — Other Ambulatory Visit: Payer: Self-pay

## 2016-05-28 DIAGNOSIS — R1900 Intra-abdominal and pelvic swelling, mass and lump, unspecified site: Secondary | ICD-10-CM

## 2016-06-25 ENCOUNTER — Telehealth: Payer: Self-pay | Admitting: Family Medicine

## 2016-06-25 NOTE — Telephone Encounter (Signed)
Call to pt regarding MRI Pt questioning the need for contrast The contrast makes her feel "unconfortable" Informed pt that per radiologist, contrast is recommended Also informed the contrast will help better visualize the duodenal density seen on CT Pt verbalizes understanding

## 2016-06-29 ENCOUNTER — Ambulatory Visit
Admission: RE | Admit: 2016-06-29 | Discharge: 2016-06-29 | Disposition: A | Discharge status: Home / Self Care | Payer: Medicare Other | Source: Ambulatory Visit | Attending: Family Medicine | Admitting: Family Medicine

## 2016-06-29 DIAGNOSIS — R1900 Intra-abdominal and pelvic swelling, mass and lump, unspecified site: Secondary | ICD-10-CM

## 2016-06-29 MED ORDER — GADOBENATE DIMEGLUMINE 529 MG/ML IV SOLN
12.0000 mL | Freq: Once | INTRAVENOUS | Status: AC | PRN
  Administered 2016-06-29: 12 mL via INTRAVENOUS

## 2016-07-04 ENCOUNTER — Telehealth: Payer: Self-pay | Admitting: Family Medicine

## 2016-07-12 ENCOUNTER — Ambulatory Visit: Payer: Medicare Other | Admitting: Internal Medicine

## 2016-10-17 ENCOUNTER — Ambulatory Visit: Payer: Medicare Other | Admitting: Family Medicine

## 2016-11-27 ENCOUNTER — Ambulatory Visit: Payer: Medicare Other | Admitting: Family Medicine

## 2016-12-24 ENCOUNTER — Encounter: Payer: Self-pay | Admitting: Family Medicine

## 2016-12-24 ENCOUNTER — Ambulatory Visit (INDEPENDENT_AMBULATORY_CARE_PROVIDER_SITE_OTHER): Payer: Medicare Other | Admitting: Family Medicine

## 2016-12-24 VITALS — BP 152/94 | HR 74 | Temp 98.9°F | Ht 62.0 in | Wt 128.8 lb

## 2016-12-24 DIAGNOSIS — I10 Essential (primary) hypertension: Secondary | ICD-10-CM

## 2016-12-24 DIAGNOSIS — G629 Polyneuropathy, unspecified: Secondary | ICD-10-CM

## 2016-12-24 DIAGNOSIS — E785 Hyperlipidemia, unspecified: Secondary | ICD-10-CM

## 2016-12-24 DIAGNOSIS — E559 Vitamin D deficiency, unspecified: Secondary | ICD-10-CM

## 2016-12-24 DIAGNOSIS — M810 Age-related osteoporosis without current pathological fracture: Secondary | ICD-10-CM

## 2016-12-24 DIAGNOSIS — M549 Dorsalgia, unspecified: Secondary | ICD-10-CM

## 2016-12-24 DIAGNOSIS — Z86010 Personal history of colonic polyps: Secondary | ICD-10-CM

## 2016-12-24 MED ORDER — DICLOFENAC SODIUM 75 MG PO TBEC: 75.0000 mg | DELAYED_RELEASE_TABLET | Freq: Two times a day (BID) | ORAL | 3 refills | Status: DC

## 2016-12-24 MED ORDER — HYDROCHLOROTHIAZIDE 12.5 MG PO CAPS: 12.5000 mg | ORAL_CAPSULE | Freq: Every day | ORAL | 3 refills | Status: DC

## 2016-12-24 MED ORDER — RALOXIFENE HCL 60 MG PO TABS: 60.0000 mg | ORAL_TABLET | Freq: Every day | ORAL | 3 refills | Status: DC

## 2016-12-24 MED ORDER — LOSARTAN POTASSIUM 50 MG PO TABS: 50.0000 mg | ORAL_TABLET | Freq: Every day | ORAL | 3 refills | Status: DC

## 2016-12-24 NOTE — Progress Notes (Signed)
Subjective:    Patient ID: Lynn [name], female    DOB: 1946/07/20, 71 y.o.   MRN: [id number]  HPI Lynn [name] is here today for a 6 month follow up on hypertension, hyperlipidemia, and Vitamin D deficiency with complaints of joint pain.The patient is doing well overall other than her ongoing issues with osteoarthritis in multiple joints especially her back and neck. She is seen the neurosurgeon and orthopedic surgeons in the past. She is due to return an FOBT. She is also due to get her mammogram. She will get lab work today or will return fasting. She is requesting refills on her blood pressure medicine or raloxifene her diclofenac and her HCTZ. The patient denies any chest pain or shortness of breath. She does have some occasional pressure with exertion. She denies any problems with her stomach other than some heartburn. She does take diclofenac but not regularly. She has not seen any blood in the stool or had any black tarry bowel movements. She has occasional pressure with voiding. She promises to restart her blood pressure medicine and come by the office in a couple weeks for recheck of blood pressure at that time. She would like for [place] to call and get her an appointment with Dr. Harley [facility] former group to have her seen regarding her low back pain and left hip pain. She will arrange herself to get her mammogram at [place].       Patient Active Problem List   Diagnosis Date Noted  . Incontinence in female 06/25/2014  . Depression 06/25/2014  . Hearing deficit 06/25/2014  . Osteoporosis 06/24/2013  . Hypertension 05/11/2013  . Osteoarthritis 05/11/2013  . Vitamin D deficiency 05/11/2013  . Personal history of colonic polyps 05/11/2013  . Hyperlipidemia 05/11/2013   Outpatient Encounter Prescriptions as of 12/24/2016  Medication Sig  . aspirin 81 MG tablet Take 81 mg by mouth daily.  . calcium carbonate (OS-CAL) 600 MG TABS tablet Take 600 mg by mouth daily with  breakfast.   . diclofenac (VOLTAREN) 75 MG EC tablet Take 1 tablet (75 mg total) by mouth 2 (two) times daily.  . hydrochlorothiazide (MICROZIDE) 12.5 MG capsule Take 1 capsule (12.5 mg total) by mouth daily.  Marland Kitchen losartan (COZAAR) 50 MG tablet Take 1 tablet (50 mg total) by mouth daily.  . raloxifene (EVISTA) 60 MG tablet Take 1 tablet (60 mg total) by mouth daily.  . RESTASIS 0.05 % ophthalmic emulsion INSTILL 1 DROP OU BID  . [DISCONTINUED] diclofenac (VOLTAREN) 75 MG EC tablet Take 1 tablet (75 mg total) by mouth 2 (two) times daily.  . [DISCONTINUED] hydrochlorothiazide (MICROZIDE) 12.5 MG capsule Take 1 capsule (12.5 mg total) by mouth daily.  . [DISCONTINUED] losartan (COZAAR) 50 MG tablet Take 1 tablet (50 mg total) by mouth daily.  . [DISCONTINUED] raloxifene (EVISTA) 60 MG tablet Take 1 tablet (60 mg total) by mouth daily.  . [DISCONTINUED] cholecalciferol (VITAMIN D) 1000 UNITS tablet Take 1,000 Units by mouth daily.  . [DISCONTINUED] spironolactone (ALDACTONE) 25 MG tablet Take 2 tablets (50 mg total) by mouth daily. (Patient not taking: Reported on 12/24/2016)   No facility-administered encounter medications on file as of 12/24/2016.       Review of Systems  Constitutional: Negative.   HENT: Negative.   Eyes: Negative.   Respiratory: Negative.   Cardiovascular: Negative.   Gastrointestinal: Negative.   Endocrine: Negative.   Genitourinary: Negative.   Musculoskeletal:       Joint pain Arthritis  Allergic/Immunologic: Negative.   Neurological: Positive for tremors.  Hematological: Negative.   Psychiatric/Behavioral: Negative.        Objective:   Physical Exam  Constitutional: She is oriented to person, place, and time. She appears well-developed and well-nourished. She appears distressed.  The patient seems somewhat anxious and distressed today. Maybe a slight decrease in memory.  HENT:  Head: Normocephalic and atraumatic.  Right Ear: External ear normal.  Left Ear:  External ear normal.  Nose: Nose normal.  Mouth/Throat: Oropharynx is clear and moist.  Eyes: Conjunctivae and EOM are normal. Pupils are equal, round, and reactive to light. Right eye exhibits no discharge. Left eye exhibits no discharge. No scleral icterus.  Neck: Normal range of motion. Neck supple. No thyromegaly present.  Cardiovascular: Normal rate, regular rhythm and normal heart sounds.  Exam reveals no friction rub.   No murmur heard. Pedal pulses were difficult to palpate especially right greater than left. There were good inguinal pulses bilaterally. Heart is regular at 72/m  Pulmonary/Chest: Effort normal and breath sounds normal. No respiratory distress. She has no wheezes. She has no rales.   The lungs were clear  Anteriorly and posteriorly  Abdominal: Soft. Bowel sounds are normal. She exhibits no mass. There is no tenderness. There is no rebound and no guarding.  There was no abdominal tenderness masses or bruits.  Musculoskeletal: Normal range of motion. She exhibits no edema or tenderness.  Lymphadenopathy:    She has no cervical adenopathy.  Neurological: She is alert and oriented to person, place, and time. She has normal reflexes. No cranial nerve deficit.  The patient's memory seems somewhat diminished today.  Skin: Skin is warm and dry. No rash noted.  Psychiatric: She has a normal mood and affect. Her behavior is normal. Judgment and thought content normal.  Nursing note and vitals reviewed.  BP (!) 153/97   Pulse 74   Temp 98.9 F (37.2 C) (Oral)   Ht _0  (1.575 m)   Wt 128 lb 12.8 oz (58.4 kg)   BMI 23.56 kg/m   Repeat blood pressure was 152/94 in the right arm sitting      Assessment & Plan:  1. Osteoporosis, unspecified osteoporosis type, unspecified pathological fracture presence -We will check when the next DEXA scan is due. - CBC with Differential/Platelet - BMP8+EGFR - Hepatic function panel  2. Essential hypertension -The blood pressure is  elevated today but the patient has not been taking her blood pressure medicine and promises to restart this and come back to the office in a couple weeks for recheck and bring blood pressure readings from home - CBC with Differential/Platelet - BMP8+EGFR - Lipid panel - Hepatic function panel  3. Hyperlipidemia, unspecified hyperlipidemia type -Continue with aggressive therapeutic lifestyle changes - CBC with Differential/Platelet - BMP8+EGFR - Lipid panel - Hepatic function panel  4. Vitamin D deficiency -Continue with vitamin D replacement - CBC with Differential/Platelet - BMP8+EGFR - Hepatic function panel - VITAMIN D 25 Hydroxy (Vit-D Deficiency, Fractures)  5. Personal history of colonic polyps -Follow-up with gastroenterology as planned - CBC with Differential/Platelet - BMP8+EGFR - Fecal occult blood, imunochemical; Future - Hepatic function panel  6. Back pain, unspecified back location, unspecified back pain laterality, unspecified chronicity -Patient has severe lumbar spine and thoracic spine arthritis and is having more trouble with her low back pain and left hip pain. - Ambulatory referral to Neurosurgery  7. Neuropathy - Ambulatory referral to Neurosurgery  Meds ordered this encounter  Medications  .  diclofenac (VOLTAREN) 75 MG EC tablet    Sig: Take 1 tablet (75 mg total) by mouth 2 (two) times daily.    Dispense:  60 tablet    Refill:  3  . hydrochlorothiazide (MICROZIDE) 12.5 MG capsule    Sig: Take 1 capsule (12.5 mg total) by mouth daily.    Dispense:  90 capsule    Refill:  3  . losartan (COZAAR) 50 MG tablet    Sig: Take 1 tablet (50 mg total) by mouth daily.    Dispense:  90 tablet    Refill:  3    Please put on HOLD  . raloxifene (EVISTA) 60 MG tablet    Sig: Take 1 tablet (60 mg total) by mouth daily.    Dispense:  90 tablet    Refill:  3   Patient Instructions                       Medicare Annual Wellness Visit  [facility] and the  medical providers at [facility] strive to bring you the best medical care.  In doing so we not only want to address your current medical conditions and concerns but also to detect new conditions early and prevent illness, disease and health-related problems.    Medicare offers a yearly Wellness Visit which allows our clinical staff to assess your need for preventative services including immunizations, lifestyle education, counseling to decrease risk of preventable diseases and screening for fall risk and other medical concerns.    This visit is provided free of charge (no copay) for all Medicare recipients. The clinical pharmacists at [facility] have begun to conduct these Wellness Visits which will also include a thorough review of all your medications.    As you primary medical provider recommend that you make an appointment for your Annual Wellness Visit if you have not done so already this year.  You may set up this appointment before you leave today or you may call back ([phone]) and schedule an appointment.  Please make sure when you call that you mention that you are scheduling your Annual Wellness Visit with the clinical pharmacist so that the appointment may be made for the proper length of time.      Continue current medications. Continue good therapeutic lifestyle changes which include good diet and exercise. Fall precautions discussed with patient. If an FOBT was given today- please return it to our front desk. If you are over 60 years old - you may need Prevnar 77 or the adult Pneumonia vaccine.   After your visit with [place] today you will receive a survey in the mail or online from [employer] regarding your care with [place]. Please take a moment to fill this out. Your feedback is very important to [place] as you can help [place] better understand your patient needs as well as improve your experience and satisfaction. WE CARE ABOUT YOU!!!  It is  important for the patient to take her blood pressure medicines regularly. Not taking medicines regularly can increase the risk of having a stroke, she should get back on her medicines and come by the office in a couple weeks for a recheck of the blood pressure by the nurse here and bring home readings at that time. We will arrange for the patient to have an appointment with the neurosurgery group that Dr. Joya Salm use to be in. Start ranitidine, the equate brand 150 mg twice daily before breakfast  and supper He should call [facility] imaging and schedule your mammogram We will call you if your pelvic exam needs to be done sooner than October after reviewing this further  Arrie Senate MD

## 2016-12-24 NOTE — Patient Instructions (Addendum)
Medicare Annual Wellness Visit  [facility] and the medical providers at [facility] strive to bring you the best medical care.  In doing so we not only want to address your current medical conditions and concerns but also to detect new conditions early and prevent illness, disease and health-related problems.    Medicare offers a yearly Wellness Visit which allows our clinical staff to assess your need for preventative services including immunizations, lifestyle education, counseling to decrease risk of preventable diseases and screening for fall risk and other medical concerns.    This visit is provided free of charge (no copay) for all Medicare recipients. The clinical pharmacists at [facility] have begun to conduct these Wellness Visits which will also include a thorough review of all your medications.    As you primary medical provider recommend that you make an appointment for your Annual Wellness Visit if you have not done so already this year.  You may set up this appointment before you leave today or you may call back ([phone]) and schedule an appointment.  Please make sure when you call that you mention that you are scheduling your Annual Wellness Visit with the clinical pharmacist so that the appointment may be made for the proper length of time.      Continue current medications. Continue good therapeutic lifestyle changes which include good diet and exercise. Fall precautions discussed with patient. If an FOBT was given today- please return it to our front desk. If you are over 75 years old - you may need Prevnar 48 or the adult Pneumonia vaccine.   After your visit with [place] today you will receive a survey in the mail or online from [employer] regarding your care with [place]. Please take a moment to fill this out. Your feedback is very important to [place] as you can help [place] better understand your patient needs as well as  improve your experience and satisfaction. WE CARE ABOUT YOU!!!  It is important for the patient to take her blood pressure medicines regularly. Not taking medicines regularly can increase the risk of having a stroke, she should get back on her medicines and come by the office in a couple weeks for a recheck of the blood pressure by the nurse here and bring home readings at that time. We will arrange for the patient to have an appointment with the neurosurgery group that Dr. Joya Salm use to be in. Start ranitidine, the equate brand 150 mg twice daily before breakfast and supper He should call [facility] and schedule your mammogram We will call you if your pelvic exam needs to be done sooner than October after reviewing this further

## 2016-12-25 LAB — CBC WITH DIFFERENTIAL/PLATELET
BASOS: 0 %
Basophils Absolute: 0 10*3/uL (ref 0.0–0.2)
EOS (ABSOLUTE): 0.2 10*3/uL (ref 0.0–0.4)
Eos: 2 %
HEMOGLOBIN: 14.6 g/dL (ref 11.1–15.9)
Hematocrit: 43.9 % (ref 34.0–46.6)
IMMATURE GRANS (ABS): 0.1 10*3/uL (ref 0.0–0.1)
IMMATURE GRANULOCYTES: 1 %
LYMPHS: 11 %
Lymphocytes Absolute: 1.1 10*3/uL (ref 0.7–3.1)
MCH: 31.7 pg (ref 26.6–33.0)
MCHC: 33.3 g/dL (ref 31.5–35.7)
MCV: 95 fL (ref 79–97)
MONOCYTES: 9 %
Monocytes Absolute: 0.9 10*3/uL (ref 0.1–0.9)
NEUTROS ABS: 8.2 10*3/uL — AB (ref 1.4–7.0)
NEUTROS PCT: 77 %
Platelets: 303 10*3/uL (ref 150–379)
RBC: 4.6 x10E6/uL (ref 3.77–5.28)
RDW: 14.2 % (ref 12.3–15.4)
WBC: 10.5 10*3/uL (ref 3.4–10.8)

## 2016-12-25 LAB — HEPATIC FUNCTION PANEL
ALBUMIN: 4.3 g/dL (ref 3.5–4.8)
ALK PHOS: 86 IU/L (ref 39–117)
ALT: 18 IU/L (ref 0–32)
AST: 18 IU/L (ref 0–40)
Bilirubin Total: 0.5 mg/dL (ref 0.0–1.2)
Bilirubin, Direct: 0.14 mg/dL (ref 0.00–0.40)
Total Protein: 6.4 g/dL (ref 6.0–8.5)

## 2016-12-25 LAB — LIPID PANEL
CHOLESTEROL TOTAL: 195 mg/dL (ref 100–199)
Chol/HDL Ratio: 2.7 ratio (ref 0.0–4.4)
HDL: 72 mg/dL (ref >39)
LDL CALC: 108 mg/dL — AB (ref 0–99)
Triglycerides: 76 mg/dL (ref 0–149)
VLDL CHOLESTEROL CAL: 15 mg/dL (ref 5–40)

## 2016-12-25 LAB — BMP8+EGFR
BUN / CREAT RATIO: 26 (ref 12–28)
BUN: 20 mg/dL (ref 8–27)
CALCIUM: 10.2 mg/dL (ref 8.7–10.3)
CHLORIDE: 99 mmol/L (ref 96–106)
CO2: 26 mmol/L (ref 18–29)
CREATININE: 0.78 mg/dL (ref 0.57–1.00)
GFR calc Af Amer: 88 mL/min/{1.73_m2} (ref >59)
GFR calc non Af Amer: 77 mL/min/{1.73_m2} (ref >59)
GLUCOSE: 96 mg/dL (ref 65–99)
Potassium: 4.2 mmol/L (ref 3.5–5.2)
Sodium: 138 mmol/L (ref 134–144)

## 2016-12-25 LAB — VITAMIN D 25 HYDROXY (VIT D DEFICIENCY, FRACTURES): VIT D 25 HYDROXY: 32.2 ng/mL (ref 30.0–100.0)

## 2016-12-26 ENCOUNTER — Other Ambulatory Visit: Payer: Self-pay | Admitting: Family Medicine

## 2016-12-26 DIAGNOSIS — Z1231 Encounter for screening mammogram for malignant neoplasm of breast: Secondary | ICD-10-CM

## 2017-01-08 ENCOUNTER — Ambulatory Visit
Admission: RE | Admit: 2017-01-08 | Discharge: 2017-01-08 | Disposition: A | Discharge status: Home / Self Care | Payer: Medicare Other | Source: Ambulatory Visit | Attending: Family Medicine | Admitting: Family Medicine

## 2017-01-08 DIAGNOSIS — Z1231 Encounter for screening mammogram for malignant neoplasm of breast: Secondary | ICD-10-CM

## 2017-01-14 ENCOUNTER — Other Ambulatory Visit: Payer: Medicare Other

## 2017-01-14 DIAGNOSIS — Z86010 Personal history of colonic polyps: Secondary | ICD-10-CM

## 2017-01-17 LAB — FECAL OCCULT BLOOD, IMMUNOCHEMICAL: Fecal Occult Bld: NEGATIVE

## 2017-03-14 ENCOUNTER — Encounter: Payer: Self-pay | Admitting: Neurology

## 2017-04-26 ENCOUNTER — Other Ambulatory Visit: Payer: Medicare Other | Admitting: Physician Assistant

## 2017-04-30 ENCOUNTER — Ambulatory Visit: Payer: Medicare Other | Admitting: Nurse Practitioner

## 2017-05-02 ENCOUNTER — Ambulatory Visit: Payer: Medicare Other | Admitting: Neurology

## 2017-05-03 ENCOUNTER — Ambulatory Visit (INDEPENDENT_AMBULATORY_CARE_PROVIDER_SITE_OTHER): Payer: Medicare Other

## 2017-05-03 ENCOUNTER — Ambulatory Visit: Payer: Medicare Other | Admitting: Nurse Practitioner

## 2017-05-03 DIAGNOSIS — Z23 Encounter for immunization: Secondary | ICD-10-CM

## 2017-05-09 ENCOUNTER — Other Ambulatory Visit: Payer: Medicare Other

## 2017-05-09 ENCOUNTER — Ambulatory Visit (INDEPENDENT_AMBULATORY_CARE_PROVIDER_SITE_OTHER): Payer: Medicare Other | Admitting: Neurology

## 2017-05-09 ENCOUNTER — Encounter: Payer: Self-pay | Admitting: Neurology

## 2017-05-09 VITALS — BP 140/80 | HR 80 | Ht 62.5 in | Wt 130.4 lb

## 2017-05-09 DIAGNOSIS — R2689 Other abnormalities of gait and mobility: Secondary | ICD-10-CM | POA: Diagnosis not present

## 2017-05-09 DIAGNOSIS — G629 Polyneuropathy, unspecified: Secondary | ICD-10-CM

## 2017-05-09 NOTE — Progress Notes (Signed)
NEUROLOGY CONSULTATION NOTE  Lynn [name] MRN: [id number] DOB: Dec 11, 1945  Referring provider: Dr. Newman Pies Primary care provider: Dr. Redge Gainer  Reason for consult:  Gait instability  Dear Dr Kathyrn Sheriff:  Thank you for your kind referral of Lynn [name] for consultation of the above symptoms. Although her history is well known to you, please allow me to reiterate it for the purpose of our medical record. Records and images were personally reviewed where available.  HISTORY OF PRESENT ILLNESS: This is a pleasant 71 year old right-handed woman with a history of hypertension, presenting for evaluation of gait unsteadiness. She had been seeing Neurosurgery for back pain. She reported symptoms started a couple of years ago, but worsened in the past 6-8 months. She had fractured her left foot and was doing PT, noting difficulties with heel-toe walking, worse when she closes her eyes. She has come very close to falling, she was drying her hair bending forward and pitched forward. She has numbness and tingling in both hands and feet up to the mid-calf region. She has occasional sharp pains in both feet. Over the past couple of years, she has noticed clumsiness in both hands, worse the past year. If she does much lifting, she starts having back pain. She denies any neck pain except when bending head forward too much. She has some stress incontinence, no bowel dysfunction. She has occasional headaches associated with eye issues. She has a little blurred vision with dry eye. Over the past 1.5 years, she has noticed a change in her speech, her voice has thickened and she is more hesitant when talking, worse this past year. She has also noticed some anxiety worsens it as well. She occasionally chokes on her food. She denies any family history of similar symptoms. She had an MRI brain without contrast at [facility] last 02/28/17, images unavailable for review, there were no  acute changes. There was mild FLAIR white matter changes, normal brain volume.    PAST MEDICAL HISTORY: Past Medical History:  Diagnosis Date  . Arthritis   . Hypertension     PAST SURGICAL HISTORY: Past Surgical History:  Procedure Laterality Date  . COLONOSCOPY    . FACIAL COSMETIC SURGERY    . FINGER TENDON REPAIR     middle rt hand-  . HAMMER TOE SURGERY  12/27/2011   Procedure: HAMMER TOE CORRECTION;  Surgeon: Wylene Simmer, MD;  Location: [facility];  Service: Orthopedics;  Laterality: Left;  2-4th    MEDICATIONS: Current Outpatient Prescriptions on File Prior to Visit  Medication Sig Dispense Refill  . aspirin 81 MG tablet Take 81 mg by mouth daily.    . calcium carbonate (OS-CAL) 600 MG TABS tablet Take 600 mg by mouth daily with breakfast.     . diclofenac (VOLTAREN) 75 MG EC tablet Take 1 tablet (75 mg total) by mouth 2 (two) times daily. 60 tablet 3  . hydrochlorothiazide (MICROZIDE) 12.5 MG capsule Take 1 capsule (12.5 mg total) by mouth daily. 90 capsule 3  . losartan (COZAAR) 50 MG tablet Take 1 tablet (50 mg total) by mouth daily. 90 tablet 3  . raloxifene (EVISTA) 60 MG tablet Take 1 tablet (60 mg total) by mouth daily. 90 tablet 3  . RESTASIS 0.05 % ophthalmic emulsion INSTILL 1 DROP OU BID  12   No current facility-administered medications on file prior to visit.     ALLERGIES: No Known Allergies  FAMILY HISTORY: Family History  Problem Relation  Age of Onset  . Colon cancer Father   . Esophageal cancer Neg Hx   . Rectal cancer Neg Hx   . Stomach cancer Neg Hx     SOCIAL HISTORY: Social History   Social History  . Marital status: Divorced    Spouse name: N/A  . Number of children: N/A  . Years of education: N/A   Occupational History  . Not on file.   Social History Main Topics  . Smoking status: Former Smoker    Quit date: 12/21/1975  . Smokeless tobacco: Never Used  . Alcohol use Yes     Comment: occ  . Drug use: No  .  Sexual activity: Not on file   Other Topics Concern  . Not on file   Social History Narrative  . No narrative on file    REVIEW OF SYSTEMS: Constitutional: No fevers, chills, or sweats, no generalized fatigue, change in appetite Eyes: No visual changes, double vision, eye pain Ear, nose and throat: No hearing loss, ear pain, nasal congestion, sore throat Cardiovascular: No chest pain, palpitations Respiratory:  No shortness of breath at rest or with exertion, wheezes GastrointestinaI: No nausea, vomiting, diarrhea, abdominal pain, fecal incontinence Genitourinary:  No dysuria, urinary retention or frequency Musculoskeletal:  No neck pain, back pain Integumentary: No rash, pruritus, skin lesions Neurological: as above Psychiatric: No depression, insomnia, anxiety Endocrine: No palpitations, fatigue, diaphoresis, mood swings, change in appetite, change in weight, increased thirst Hematologic/Lymphatic:  No anemia, purpura, petechiae. Allergic/Immunologic: no itchy/runny eyes, nasal congestion, recent allergic reactions, rashes  PHYSICAL EXAM: Vitals:   05/09/17 1248  BP: 140/80  Pulse: 80  SpO2: 94%   General: No acute distress Head:  Normocephalic/atraumatic Eyes: Fundoscopic exam shows bilateral sharp discs, no vessel changes, exudates, or hemorrhages Neck: supple, no paraspinal tenderness, full range of motion Back: No paraspinal tenderness Heart: regular rate and rhythm Lungs: Clear to auscultation bilaterally. Vascular: No carotid bruits. Skin/Extremities: No rash, no edema Neurological Exam: Mental status: alert and oriented to person, place, and time, no aphasia. She is noted to have hesitant speech, ?spasmodic dysphonia. Fund of knowledge is appropriate.  Recent and remote memory are intact.  Attention and concentration are normal.    Able to name objects and repeat phrases. Cranial nerves: CN I: not tested CN II: pupils equal, round and reactive to light, visual  fields intact, fundi unremarkable. CN III, IV, VI:  full range of motion, no nystagmus, no ptosis CN V: facial sensation intact CN VII: upper and lower face symmetric CN VIII: hearing intact to finger rub CN IX, X: gag intact, uvula midline CN XI: sternocleidomastoid and trapezius muscles intact CN XII: tongue midline Bulk & Tone: normal, no fasciculations. Motor: 5/5 throughout with no pronator drift, but noted to have mild weakness with left APB Sensation: decreased cold in both hands, intact pin, decreased cold in both feet, intact pin. Decreased vibration to ankles bilaterally, intact joint position sense.  No extinction to double simultaneous stimulation.  Romberg test positive Deep Tendon Reflexes: +1 on both UE, right LE, +1 left patella, unable to elicit ankle jerks bilaterally Plantar responses: downgoing bilaterally Cerebellar: no incoordination on finger to nose testing Gait: narrow-based and steady, able to tandem walk adequately. Tremor: none  IMPRESSION: This is a pleasant 71 year old right-handed woman with a history of hypertension, presenting for evaluation of gait unsteadiness. She has also noticed a change in speech pattern over the past year. Neurological exam shows evidence of a length-dependent  neuropathy in both hands and legs. We discussed how neuropathy can cause gait instability. Neuropathy bloodwork will be ordered. She will be scheduled for an EMG/NCV of the left UE and LE with Dr. Posey Pronto to further evaluate symptoms, particularly with note of speech changes. Continue to monitor, we may refer to ENT for evaluation of vocal cords in the future. She will be referred to PT for balance therapy. She will follow-up in 3-4 months and knows to call for any changes.   Thank you for allowing me to participate in the care of this patient. Please do not hesitate to call for any questions or concerns.   Ellouise Newer, M.D.  CC: Dr. Laurance Flatten, Dr. Arnoldo Morale

## 2017-05-09 NOTE — Patient Instructions (Addendum)
1. Bloodwork for TSH, B12, folate, SPEP/IFE, ESR  Your provider has requested that you have labwork completed today. Please go to [facility] ([address]) on the second floor of this building before leaving the office today. You do not need to check in. If you are not called within 15 minutes please check with the front desk.   2. Schedule EMG/NCV of left UE and both LE with Dr. Posey Pronto 3. Refer to PT for Balance therapy 4. Follow-up in 3-4 months, call for any changes

## 2017-05-10 LAB — VITAMIN B12: Vitamin B-12: 622 pg/mL (ref 200–1100)

## 2017-05-10 LAB — SEDIMENTATION RATE: Sed Rate: 6 mm/h (ref 0–30)

## 2017-05-10 LAB — FOLATE: FOLATE: 12.9 ng/mL

## 2017-05-10 LAB — TSH: TSH: 1.11 mIU/L (ref 0.40–4.50)

## 2017-05-14 LAB — MULTIPLE MYELOMA PANEL, SERUM
ALPHA2 GLOB SERPL ELPH-MCNC: 0.7 g/dL (ref 0.4–1.0)
Albumin SerPl Elph-Mcnc: 3.7 g/dL (ref 2.9–4.4)
Albumin/Glob SerPl: 1.3 (ref 0.7–1.7)
Alpha 1: 0.3 g/dL (ref 0.0–0.4)
B-GLOBULIN SERPL ELPH-MCNC: 1 g/dL (ref 0.7–1.3)
GAMMA GLOB SERPL ELPH-MCNC: 0.9 g/dL (ref 0.4–1.8)
GLOBULIN, TOTAL: 2.9 g/dL (ref 2.2–3.9)
IGG (IMMUNOGLOBIN G), SERUM: 775 mg/dL (ref 700–1600)
IgA/Immunoglobulin A, Serum: 109 mg/dL (ref 64–422)
IgM (Immunoglobulin M), Srm: 130 mg/dL (ref 26–217)
TOTAL PROTEIN: 6.6 g/dL (ref 6.0–8.5)

## 2017-05-17 ENCOUNTER — Encounter: Payer: Self-pay | Admitting: Nurse Practitioner

## 2017-05-17 ENCOUNTER — Ambulatory Visit (INDEPENDENT_AMBULATORY_CARE_PROVIDER_SITE_OTHER): Payer: Medicare Other | Admitting: Nurse Practitioner

## 2017-05-17 ENCOUNTER — Telehealth: Payer: Self-pay

## 2017-05-17 VITALS — BP 116/73 | HR 80 | Temp 97.1°F | Ht 62.0 in | Wt 130.0 lb

## 2017-05-17 DIAGNOSIS — Z1231 Encounter for screening mammogram for malignant neoplasm of breast: Secondary | ICD-10-CM

## 2017-05-17 DIAGNOSIS — Z01419 Encounter for gynecological examination (general) (routine) without abnormal findings: Secondary | ICD-10-CM

## 2017-05-17 DIAGNOSIS — Z1239 Encounter for other screening for malignant neoplasm of breast: Secondary | ICD-10-CM

## 2017-05-17 NOTE — Telephone Encounter (Signed)
-----   Message from Cameron Sprang, MD sent at 05/16/2017  4:26 PM EDT ----- Pls let her know bloodwork is normal, thanks

## 2017-05-17 NOTE — Addendum Note (Signed)
Addended by: Chevis Pretty on: 05/17/2017 04:17 PM   Modules accepted: Orders

## 2017-05-17 NOTE — Progress Notes (Signed)
   Subjective:    Patient ID: Lynn [name], female    DOB: Feb 10, 1946, 71 y.o.   MRN: [id number]  HPI Patient come sin today for pap, pelvic and breast exam. SHe is a regular patient of Dr. Laurance Flatten that just seen for chronic follow up on 12/24/16. No changes were made to her medical treatment at that time. SHe is doing well today without complaints.    Review of Systems  Constitutional: Negative for activity change and appetite change.  HENT: Negative.   Eyes: Negative for pain.  Respiratory: Negative for shortness of breath.   Cardiovascular: Negative for chest pain, palpitations and leg swelling.  Gastrointestinal: Negative for abdominal pain.  Endocrine: Negative for polydipsia.  Genitourinary: Negative.   Skin: Negative for rash.  Neurological: Negative for dizziness, weakness and headaches.  Hematological: Does not bruise/bleed easily.  Psychiatric/Behavioral: Negative.   All other systems reviewed and are negative.      Objective:   Physical Exam  Constitutional: She is oriented to person, place, and time. She appears well-developed and well-nourished.  HENT:  Head: Normocephalic.  Right Ear: Hearing, tympanic membrane, external ear and ear canal normal.  Left Ear: Hearing, tympanic membrane, external ear and ear canal normal.  Nose: Nose normal.  Mouth/Throat: Uvula is midline and oropharynx is clear and moist.  Eyes: Pupils are equal, round, and reactive to light. Conjunctivae and EOM are normal.  Neck: Normal range of motion and full passive range of motion without pain. Neck supple. No JVD present. Carotid bruit is not present. No thyroid mass and no thyromegaly present.  Cardiovascular: Normal rate, normal heart sounds and intact distal pulses.   No murmur heard. Pulmonary/Chest: Effort normal and breath sounds normal. Right breast exhibits no inverted nipple, no mass, no nipple discharge, no skin change and no tenderness. Left breast exhibits no inverted nipple,  no mass, no nipple discharge, no skin change and no tenderness.  Abdominal: Soft. Bowel sounds are normal. She exhibits no mass. There is no tenderness.  Genitourinary: Vagina normal and uterus normal. No breast swelling, tenderness, discharge or bleeding. No vaginal discharge found.  Genitourinary Comments: bimanual exam-No adnexal masses or tenderness. Cervical stenosis  Musculoskeletal: Normal range of motion.  Lymphadenopathy:    She has no cervical adenopathy.  Neurological: She is alert and oriented to person, place, and time.  Skin: Skin is warm and dry.  Psychiatric: She has a normal mood and affect. Her behavior is normal. Judgment and thought content normal.    BP 116/73   Pulse 80   Temp (!) 97.1 F (36.2 C) (Oral)   Ht 5\' 2"  (1.575 m)   Wt 130 lb (59 kg)   BMI 23.78 kg/m       Assessment & Plan:  Encounter for cervical Pap smear with pelvic exam  Encounter for screening breast examination  Keep follow up appointments with Dr. Mayra Neer, FNP

## 2017-05-17 NOTE — Telephone Encounter (Signed)
Spoke with pt relaying message below.  Per pt's request a cope of report will be sent to her home.

## 2017-05-20 ENCOUNTER — Encounter: Payer: Self-pay | Admitting: Neurology

## 2017-05-20 DIAGNOSIS — R2689 Other abnormalities of gait and mobility: Secondary | ICD-10-CM | POA: Insufficient documentation

## 2017-05-20 DIAGNOSIS — G629 Polyneuropathy, unspecified: Secondary | ICD-10-CM | POA: Insufficient documentation

## 2017-05-20 LAB — PAP IG (IMAGE GUIDED): PAP SMEAR COMMENT: 0

## 2017-06-25 ENCOUNTER — Ambulatory Visit (INDEPENDENT_AMBULATORY_CARE_PROVIDER_SITE_OTHER): Payer: Medicare Other | Admitting: Family Medicine

## 2017-06-25 ENCOUNTER — Telehealth: Payer: Self-pay | Admitting: Family Medicine

## 2017-06-25 ENCOUNTER — Encounter: Payer: Self-pay | Admitting: Family Medicine

## 2017-06-25 VITALS — BP 131/72 | HR 80 | Temp 97.1°F | Ht 62.0 in | Wt 129.0 lb

## 2017-06-25 DIAGNOSIS — R42 Dizziness and giddiness: Secondary | ICD-10-CM | POA: Diagnosis not present

## 2017-06-25 DIAGNOSIS — R2681 Unsteadiness on feet: Secondary | ICD-10-CM

## 2017-06-25 DIAGNOSIS — G609 Hereditary and idiopathic neuropathy, unspecified: Secondary | ICD-10-CM | POA: Diagnosis not present

## 2017-06-25 DIAGNOSIS — R0789 Other chest pain: Secondary | ICD-10-CM

## 2017-06-25 DIAGNOSIS — E559 Vitamin D deficiency, unspecified: Secondary | ICD-10-CM

## 2017-06-25 DIAGNOSIS — R2689 Other abnormalities of gait and mobility: Secondary | ICD-10-CM

## 2017-06-25 DIAGNOSIS — M150 Primary generalized (osteo)arthritis: Secondary | ICD-10-CM

## 2017-06-25 DIAGNOSIS — M159 Polyosteoarthritis, unspecified: Secondary | ICD-10-CM

## 2017-06-25 DIAGNOSIS — E785 Hyperlipidemia, unspecified: Secondary | ICD-10-CM

## 2017-06-25 DIAGNOSIS — I10 Essential (primary) hypertension: Secondary | ICD-10-CM

## 2017-06-25 MED ORDER — DULOXETINE HCL 30 MG PO CPEP: 30.0000 mg | ORAL_CAPSULE | Freq: Every day | ORAL | 0 refills | Status: DC

## 2017-06-25 MED ORDER — DICLOFENAC SODIUM 75 MG PO TBEC: 75.0000 mg | DELAYED_RELEASE_TABLET | Freq: Two times a day (BID) | ORAL | 3 refills | Status: DC

## 2017-06-25 NOTE — Telephone Encounter (Signed)
Mri - printed and mailed

## 2017-06-25 NOTE — Progress Notes (Signed)
Subjective:    Patient ID: Lynn [name], female    DOB: May 12, 1946, 71 y.o.   MRN: [id number]  HPI Pt here for follow up and management of chronic medical problems which includes hyperlipidemia and hypertension. She is taking medication regularly.  The patient is doing well overall other than complaining with some general arthralgias.  She apparently did go see the neurosurgeon and was sent to the neurologist because of a peripheral neuropathy and the neurologist did several tests including a B12 level sed rate and myeloma panel and all of these tests were negative.  She also did a thyroid panel and this was -2.  She is requesting a refill on her diclofenac.  The patient also had a folate level that was within normal limits.  The patient denies any trouble with chest pain but does have some tightness with climbing steps and some slight shortness of breath but probably no more than usual.  She has no trouble with nausea or vomiting diarrhea or blood in the stool but does have occasional heartburn that may be associated with taking the diclofenac.  She did have a colonoscopy in 2015.  She has occasional pressure but no burning with passing her water.  She only takes 1 diclofenac daily and this is usually in the morning and I encouraged her to make sure she takes it after eating.  We discussed options for pain control and I mentioned in a couple of different medications and one was gabapentin and another was Cymbalta.  I think she is willing to try the Cymbalta and having her to take 30 mg once daily at nighttime.  We will only try a couple of weeks worth so it can give her a sense that this may be helping control her pain any without putting her on any narcotics.  She is agreeable to doing that.  She is also questioning me about the need for a repeat Pap smear and pelvic exam and I will discussed this with the provider that did her last one and make sure that we get the appropriate one done this time.   The note from the neurologist that she saw recently was reviewed prior to the visit.     Patient Active Problem List   Diagnosis Date Noted  . Neuropathy 05/20/2017  . Abnormality of gait due to impairment of balance 05/20/2017  . Incontinence in female 06/25/2014  . Depression 06/25/2014  . Hearing deficit 06/25/2014  . Osteoporosis 06/24/2013  . Hypertension 05/11/2013  . Osteoarthritis 05/11/2013  . Vitamin D deficiency 05/11/2013  . Personal history of colonic polyps 05/11/2013  . Hyperlipidemia 05/11/2013   Outpatient Encounter Medications as of 06/25/2017  Medication Sig  . aspirin 81 MG tablet Take 81 mg by mouth daily.  . calcium carbonate (OS-CAL) 600 MG TABS tablet Take 600 mg by mouth daily with breakfast.   . diclofenac (VOLTAREN) 75 MG EC tablet Take 1 tablet (75 mg total) by mouth 2 (two) times daily.  . hydrochlorothiazide (MICROZIDE) 12.5 MG capsule Take 1 capsule (12.5 mg total) by mouth daily.  Marland Kitchen losartan (COZAAR) 50 MG tablet Take 1 tablet (50 mg total) by mouth daily.  . raloxifene (EVISTA) 60 MG tablet Take 60 mg by mouth daily.   No facility-administered encounter medications on file as of 06/25/2017.       Review of Systems  Constitutional: Negative.   HENT: Negative.   Eyes: Negative.   Respiratory: Negative.   Cardiovascular: Negative.   Gastrointestinal:  Negative.   Endocrine: Negative.   Genitourinary: Negative.   Musculoskeletal: Positive for arthralgias.  Skin: Negative.   Allergic/Immunologic: Negative.   Neurological: Negative.   Hematological: Negative.   Psychiatric/Behavioral: Negative.        Objective:   Physical Exam  Constitutional: She is oriented to person, place, and time. She appears well-developed and well-nourished. No distress.  The patient is pleasant but concerned about imbalance issues and gait instability issues.  HENT:  Head: Normocephalic and atraumatic.  Right Ear: External ear normal.  Left Ear: External ear  normal.  Nose: Nose normal.  Mouth/Throat: Oropharynx is clear and moist. No oropharyngeal exudate.  Eyes: Conjunctivae and EOM are normal. Pupils are equal, round, and reactive to light. Right eye exhibits no discharge. Left eye exhibits no discharge. No scleral icterus.  Neck: Normal range of motion. Neck supple. No thyromegaly present.  No bruits thyromegaly or anterior cervical adenopathy  Cardiovascular: Normal rate, regular rhythm, normal heart sounds and intact distal pulses.  No murmur heard. Heart is regular at 72/min  Pulmonary/Chest: Effort normal and breath sounds normal. No respiratory distress. She has no wheezes. She has no rales.  Clear anteriorly and posteriorly  Abdominal: Soft. Bowel sounds are normal. She exhibits no mass. There is no tenderness. There is no rebound and no guarding.  No abdominal tenderness or organ enlargement masses or bruits or inguinal adenopathy  Musculoskeletal: Normal range of motion. She exhibits no edema or tenderness.  Lymphadenopathy:    She has no cervical adenopathy.  Neurological: She is alert and oriented to person, place, and time. She has normal reflexes. No cranial nerve deficit.  When the patient arose from sitting to get up on the table she almost stumbled and had to catch her to keep her from falling.  Skin: Skin is warm and dry. No rash noted.  Psychiatric: She has a normal mood and affect. Her behavior is normal. Judgment and thought content normal.  Nursing note and vitals reviewed.  BP 131/72 (BP Location: Right Arm)   Pulse 80   Temp (!) 97.1 F (36.2 C) (Oral)   Ht 5' 2"  (1.575 m)   Wt 129 lb (58.5 kg)   BMI 23.59 kg/m   EKG with results pending====      Assessment & Plan:  1. Essential hypertension -The blood pressure is good today and she will continue with current treatment - BMP8+EGFR - CBC with Differential/Platelet - Hepatic function panel - EKG 12-Lead  2. Hyperlipidemia, unspecified hyperlipidemia  type -Continue with aggressive therapeutic lifestyle changes pending results of lab work - CBC with Differential/Platelet - Lipid panel  3. Vitamin D deficiency -Continue with vitamin D replacement pending results of lab work - VITAMIN D 25 Hydroxy (Vit-D Deficiency, Fractures) - CBC with Differential/Platelet  4. Gait instability -The patient feels unsteady on her feet and has a sensation of dizziness even when standing still.  The neurological exam was negative. - CBC with Differential/Platelet  5. Feeling of chest tightness -She describes some feeling of chest tightness and may be some slight shortness of breath especially with climbing stairs or walking for extensive distances. - EKG 12-Lead  6. Dizziness -The sensation of dizziness is worse with standing and certain movements.  Even in the office as she stood to get onto the table by stepping on the step to go up she almost stumbled and fell  7. Imbalance -Get an opinion from ENT to further evaluate this.  8. Idiopathic peripheral neuropathy -Some of this  may be lumbar spine arthritis affecting nerves.  9. Primary osteoarthritis involving multiple joints -Continue with diclofenac and make sure that she takes this after eating.  We will try adding some Cymbalta to see if this could help control some of the pain that she is having.  10. Chest tightness or pressure -EKG  Meds ordered this encounter  Medications  . diclofenac (VOLTAREN) 75 MG EC tablet    Sig: Take 1 tablet (75 mg total) by mouth 2 (two) times daily.    Dispense:  60 tablet    Refill:  3  . DULoxetine (CYMBALTA) 30 MG capsule    Sig: Take 1 capsule (30 mg total) by mouth daily.    Dispense:  14 capsule    Refill:  0   Patient Instructions                       Medicare Annual Wellness Visit  [place] and the medical providers at [facility] strive to bring you the best medical care.  In doing so we not only want to address  your current medical conditions and concerns but also to detect new conditions early and prevent illness, disease and health-related problems.    Medicare offers a yearly Wellness Visit which allows our clinical staff to assess your need for preventative services including immunizations, lifestyle education, counseling to decrease risk of preventable diseases and screening for fall risk and other medical concerns.    This visit is provided free of charge (no copay) for all Medicare recipients. The clinical pharmacists at [facility] have begun to conduct these Wellness Visits which will also include a thorough review of all your medications.    As you primary medical provider recommend that you make an appointment for your Annual Wellness Visit if you have not done so already this year.  You may set up this appointment before you leave today or you may call back ([phone]) and schedule an appointment.  Please make sure when you call that you mention that you are scheduling your Annual Wellness Visit with the clinical pharmacist so that the appointment may be made for the proper length of time.     Continue current medications. Continue good therapeutic lifestyle changes which include good diet and exercise. Fall precautions discussed with patient. If an FOBT was given today- please return it to our front desk. If you are over 81 years old - you may need Prevnar 21 or the adult Pneumonia vaccine.  **Flu shots are available--- please call and schedule a FLU-CLINIC appointment**  After your visit with [place] today you will receive a survey in the mail or online from [employer] regarding your care with [place]. Please take a moment to fill this out. Your feedback is very important to [place] as you can help [place] better understand your patient needs as well as improve your experience and satisfaction. WE CARE ABOUT YOU!!!   We will arrange for you to have an appointment with an ear nose and  throat specialist regarding balance and dizziness issues that you are describing today. Please try the Cymbalta to see if this helps control some of the chronic arthralgias that you described during the visit today. You can continue to take the diclofenac but remember to take it after eating. We will call with your lab work results as soon as these results become available Continue to be careful and move slowly and did not put yourself  at risk for falling You still need to get your pelvic exam next fall and get a Pap smear 1 year after that.   Arrie Senate MD

## 2017-06-25 NOTE — Addendum Note (Signed)
Addended by: Zannie Cove on: 06/25/2017 03:06 PM   Modules accepted: Orders

## 2017-06-25 NOTE — Patient Instructions (Addendum)
Medicare Annual Wellness Visit  [facility] and the medical providers at [facility] strive to bring you the best medical care.  In doing so we not only want to address your current medical conditions and concerns but also to detect new conditions early and prevent illness, disease and health-related problems.    Medicare offers a yearly Wellness Visit which allows our clinical staff to assess your need for preventative services including immunizations, lifestyle education, counseling to decrease risk of preventable diseases and screening for fall risk and other medical concerns.    This visit is provided free of charge (no copay) for all Medicare recipients. The clinical pharmacists at [facility] have begun to conduct these Wellness Visits which will also include a thorough review of all your medications.    As you primary medical provider recommend that you make an appointment for your Annual Wellness Visit if you have not done so already this year.  You may set up this appointment before you leave today or you may call back ([phone]) and schedule an appointment.  Please make sure when you call that you mention that you are scheduling your Annual Wellness Visit with the clinical pharmacist so that the appointment may be made for the proper length of time.     Continue current medications. Continue good therapeutic lifestyle changes which include good diet and exercise. Fall precautions discussed with patient. If an FOBT was given today- please return it to our front desk. If you are over 6 years old - you may need Prevnar 29 or the adult Pneumonia vaccine.  **Flu shots are available--- please call and schedule a FLU-CLINIC appointment**  After your visit with [place] today you will receive a survey in the mail or online from [employer] regarding your care with [place]. Please take a moment to fill this out. Your feedback is very  important to [place] as you can help [place] better understand your patient needs as well as improve your experience and satisfaction. WE CARE ABOUT YOU!!!   We will arrange for you to have an appointment with an ear nose and throat specialist regarding balance and dizziness issues that you are describing today. Please try the Cymbalta to see if this helps control some of the chronic arthralgias that you described during the visit today. You can continue to take the diclofenac but remember to take it after eating. We will call with your lab work results as soon as these results become available Continue to be careful and move slowly and did not put yourself at risk for falling You still need to get your pelvic exam next fall and get a Pap smear 1 year after that.

## 2017-06-26 ENCOUNTER — Telehealth: Payer: Self-pay | Admitting: Family Medicine

## 2017-06-26 ENCOUNTER — Encounter: Payer: Self-pay | Admitting: Family Medicine

## 2017-06-26 DIAGNOSIS — I700 Atherosclerosis of aorta: Secondary | ICD-10-CM | POA: Insufficient documentation

## 2017-06-26 LAB — CBC WITH DIFFERENTIAL/PLATELET
BASOS ABS: 0 10*3/uL (ref 0.0–0.2)
Basos: 0 %
EOS (ABSOLUTE): 0.1 10*3/uL (ref 0.0–0.4)
Eos: 1 %
HEMATOCRIT: 42.9 % (ref 34.0–46.6)
HEMOGLOBIN: 14.2 g/dL (ref 11.1–15.9)
IMMATURE GRANULOCYTES: 1 %
Immature Grans (Abs): 0.1 10*3/uL (ref 0.0–0.1)
LYMPHS ABS: 1.1 10*3/uL (ref 0.7–3.1)
Lymphs: 10 %
MCH: 32.6 pg (ref 26.6–33.0)
MCHC: 33.1 g/dL (ref 31.5–35.7)
MCV: 98 fL — AB (ref 79–97)
MONOS ABS: 1 10*3/uL — AB (ref 0.1–0.9)
Monocytes: 9 %
NEUTROS PCT: 79 %
Neutrophils Absolute: 8.5 10*3/uL — ABNORMAL HIGH (ref 1.4–7.0)
Platelets: 340 10*3/uL (ref 150–379)
RBC: 4.36 x10E6/uL (ref 3.77–5.28)
RDW: 13 % (ref 12.3–15.4)
WBC: 10.7 10*3/uL (ref 3.4–10.8)

## 2017-06-26 LAB — BMP8+EGFR
BUN / CREAT RATIO: 26 (ref 12–28)
BUN: 24 mg/dL (ref 8–27)
CHLORIDE: 99 mmol/L (ref 96–106)
CO2: 28 mmol/L (ref 20–29)
CREATININE: 0.93 mg/dL (ref 0.57–1.00)
Calcium: 9.9 mg/dL (ref 8.7–10.3)
GFR calc Af Amer: 72 mL/min/{1.73_m2} (ref >59)
GFR calc non Af Amer: 62 mL/min/{1.73_m2} (ref >59)
GLUCOSE: 85 mg/dL (ref 65–99)
Potassium: 4.2 mmol/L (ref 3.5–5.2)
SODIUM: 140 mmol/L (ref 134–144)

## 2017-06-26 LAB — HEPATIC FUNCTION PANEL
ALK PHOS: 69 IU/L (ref 39–117)
ALT: 19 IU/L (ref 0–32)
AST: 15 IU/L (ref 0–40)
Albumin: 4.3 g/dL (ref 3.5–4.8)
Bilirubin Total: 0.4 mg/dL (ref 0.0–1.2)
Bilirubin, Direct: 0.11 mg/dL (ref 0.00–0.40)
Total Protein: 6.4 g/dL (ref 6.0–8.5)

## 2017-06-26 LAB — LIPID PANEL
CHOL/HDL RATIO: 3.4 ratio (ref 0.0–4.4)
CHOLESTEROL TOTAL: 205 mg/dL — AB (ref 100–199)
HDL: 61 mg/dL (ref >39)
LDL Calculated: 114 mg/dL — ABNORMAL HIGH (ref 0–99)
TRIGLYCERIDES: 148 mg/dL (ref 0–149)
VLDL Cholesterol Cal: 30 mg/dL (ref 5–40)

## 2017-06-26 LAB — VITAMIN D 25 HYDROXY (VIT D DEFICIENCY, FRACTURES): VIT D 25 HYDROXY: 28.8 ng/mL — AB (ref 30.0–100.0)

## 2017-06-26 MED ORDER — VITAMIN D (ERGOCALCIFEROL) 1.25 MG (50000 UNIT) PO CAPS: 50000.0000 [IU] | ORAL_CAPSULE | ORAL | 3 refills | Status: DC

## 2017-06-26 NOTE — Telephone Encounter (Signed)
Pt aware of labs  

## 2017-06-29 ENCOUNTER — Other Ambulatory Visit: Payer: Self-pay | Admitting: Family Medicine

## 2017-08-05 ENCOUNTER — Ambulatory Visit: Payer: Medicare Other | Admitting: Neurology

## 2017-08-20 ENCOUNTER — Encounter: Payer: Self-pay | Admitting: Neurology

## 2017-08-20 ENCOUNTER — Ambulatory Visit (INDEPENDENT_AMBULATORY_CARE_PROVIDER_SITE_OTHER): Payer: Medicare Other | Admitting: Neurology

## 2017-08-20 ENCOUNTER — Other Ambulatory Visit: Payer: Self-pay

## 2017-08-20 VITALS — BP 132/78 | HR 92 | Resp 18 | Ht 62.5 in | Wt 127.0 lb

## 2017-08-20 DIAGNOSIS — R42 Dizziness and giddiness: Secondary | ICD-10-CM

## 2017-08-20 DIAGNOSIS — G629 Polyneuropathy, unspecified: Secondary | ICD-10-CM | POA: Diagnosis not present

## 2017-08-20 DIAGNOSIS — R2689 Other abnormalities of gait and mobility: Secondary | ICD-10-CM | POA: Diagnosis not present

## 2017-08-20 NOTE — Progress Notes (Signed)
NEUROLOGY FOLLOW UP OFFICE NOTE  Lynn [name] [id number] [birth year]  HISTORY OF PRESENT ILLNESS: I had the pleasure of seeing Lynn [name] in follow-up in the neurology clinic on 08/20/2017.  The patient was last seen 3 months ago for gait imbalance and dizziness. Records and images were personally reviewed where available.  Neuropathy labs were normal. She has not yet done the EMG due to fear that it is painful. She has not yet done Balance therapy. She continues to have balance issues when she stands up, she feels very unstable. She is also reporting that even without stepping on the ground, when she moves in bed, she would feel unsteady. When she tilts her head back in the shower, she has to hold on. She has not had any falls. She continues to have the speech changes where she needs to take deep breaths in between talking. She denies any difficulty breathing, sometimes she gets choked when swallowing. She denies any headaches, vision changes. She has numbness and tingling in both feet and hands, they are always cold. She has noticed occasional muscle movements in her right arm and sometimes in her legs. She has neck and back pain.  HPI 05/09/2017: This is a pleasant 72 yo RH woman with a history of hypertension, who presented for evaluation of gait unsteadiness. She had been seeing Neurosurgery for back pain. She reported symptoms started a couple of years ago, but worsened in the past 6-8 months. She had fractured her left foot and was doing PT, noting difficulties with heel-toe walking, worse when she closes her eyes. She has come very close to falling, she was drying her hair bending forward and pitched forward. She has numbness and tingling in both hands and feet up to the mid-calf region. She has occasional sharp pains in both feet. Over the past couple of years, she has noticed clumsiness in both hands, worse the past year. If she does much lifting, she starts having back pain. She  denies any neck pain except when bending head forward too much. She has some stress incontinence, no bowel dysfunction. She has occasional headaches associated with eye issues. She has a little blurred vision with dry eye. Over the past 1.5 years, she has noticed a change in her speech, her voice has thickened and she is more hesitant when talking, worse this past year. She has also noticed some anxiety worsens it as well. She occasionally chokes on her food. She denies any family history of similar symptoms. She had an MRI brain without contrast at [facility] last 02/28/17, images unavailable for review, there were no acute changes. There was mild FLAIR white matter changes, normal brain volume.   PAST MEDICAL HISTORY: Past Medical History:  Diagnosis Date  . Arthritis   . Hypertension     MEDICATIONS: Current Outpatient Medications on File Prior to Visit  Medication Sig Dispense Refill  . aspirin 81 MG tablet Take 81 mg by mouth daily.    . calcium carbonate (OS-CAL) 600 MG TABS tablet Take 600 mg by mouth daily with breakfast.     . diclofenac (VOLTAREN) 75 MG EC tablet Take 1 tablet (75 mg total) by mouth 2 (two) times daily. 60 tablet 3  . DULoxetine (CYMBALTA) 30 MG capsule TAKE 1 CAPSULE BY MOUTH DAILY 90 capsule 0  . hydrochlorothiazide (MICROZIDE) 12.5 MG capsule Take 1 capsule (12.5 mg total) by mouth daily. 90 capsule 3  . losartan (COZAAR) 50 MG tablet Take 1 tablet (50 mg  total) by mouth daily. 90 tablet 3  . raloxifene (EVISTA) 60 MG tablet Take 60 mg by mouth daily.    . Vitamin D, Ergocalciferol, (DRISDOL) 50000 units CAPS capsule Take 1 capsule (50,000 Units total) by mouth every 7 (seven) days. 12 capsule 3   No current facility-administered medications on file prior to visit.     ALLERGIES: No Known Allergies  FAMILY HISTORY: Family History  Problem Relation Age of Onset  . Colon cancer Father   . Esophageal cancer Neg Hx   . Rectal cancer Neg Hx   .  Stomach cancer Neg Hx     SOCIAL HISTORY: Social History   Socioeconomic History  . Marital status: Divorced    Spouse name: Not on file  . Number of children: 1  . Years of education: 66  . Highest education level: Not on file  Social Needs  . Financial resource strain: Not on file  . Food insecurity - worry: Not on file  . Food insecurity - inability: Not on file  . Transportation needs - medical: Not on file  . Transportation needs - non-medical: Not on file  Occupational History  . Not on file  Tobacco Use  . Smoking status: Former Smoker    Last attempt to quit: 12/21/1975    Years since quitting: 41.6  . Smokeless tobacco: Never Used  Substance and Sexual Activity  . Alcohol use: Yes    Comment: occ  . Drug use: No  . Sexual activity: Not on file  Other Topics Concern  . Not on file  Social History Narrative   Patient lives in a 2 story home.  Her mother lives with her.  Children: 1 son.  Retired from [employer].  Education: college.     REVIEW OF SYSTEMS: Constitutional: No fevers, chills, or sweats, no generalized fatigue, change in appetite Eyes: No visual changes, double vision, eye pain Ear, nose and throat: No hearing loss, ear pain, nasal congestion, sore throat Cardiovascular: No chest pain, palpitations Respiratory:  No shortness of breath at rest or with exertion, wheezes GastrointestinaI: No nausea, vomiting, diarrhea, abdominal pain, fecal incontinence Genitourinary:  No dysuria, urinary retention or frequency Musculoskeletal:  No neck pain, back pain Integumentary: No rash, pruritus, skin lesions Neurological: as above Psychiatric: No depression, insomnia, anxiety Endocrine: No palpitations, fatigue, diaphoresis, mood swings, change in appetite, change in weight, increased thirst Hematologic/Lymphatic:  No anemia, purpura, petechiae. Allergic/Immunologic: no itchy/runny eyes, nasal congestion, recent allergic reactions, rashes  PHYSICAL EXAM: Vitals:     08/20/17 1003  BP: 132/78  Pulse: 92  Resp: 18   General: No acute distress, she is noted again to have hesitant speech, ?spasmodic dysphonia Head:  Normocephalic/atraumatic Neck: supple, no paraspinal tenderness, full range of motion Heart:  Regular rate and rhythm Lungs:  Clear to auscultation bilaterally Back: No paraspinal tenderness Skin/Extremities: No rash, no edema Neurological Exam: alert and oriented to person, place, and time. No aphasia or dysarthria. Fund of knowledge is appropriate.  Recent and remote memory are intact.  Attention and concentration are normal.    Able to name objects and repeat phrases. Cranial nerves: Pupils equal, round, reactive to light.  Extraocular movements intact with no nystagmus. Visual fields full. Facial sensation intact. No facial asymmetry. Tongue, uvula, palate midline.  Motor: Bulk and tone normal, no fasciculations noted today, muscle strength 5/5 throughout with no pronator drift.  Sensation to pin, cold on both UE, dec vibration to ankles bilaterally, tingling with pin in both  LE. +Romberg test. Deep tendon reflexes +1 on both UE, unable to elicit on both LE. Toes downgoing.  Finger to nose testing intact. Able to rise with arms crossed over chest.  Gait narrow-based and steady, unable to tandem walk adequately.   IMPRESSION: This is a pleasant 72 yo RH woman with a history of hypertension, presenting for evaluation of gait unsteadiness. She has also noticed a change in speech pattern over the past year. Neurological exam shows evidence of a length-dependent neuropathy in both hands and legs. MRI brain unremarkable. We again discussed how neuropathy can cause gait instability. Neuropathy labs normal. She will proceed with EMG/NCV with MND protocol to further evaluate symptoms. She has had speech changes (?spasmodic dysphonia), and reports muscle movements. We discussed also proceeding with Balance therapy. She also reports dizziness with head  movements, and will be referref for vestibular therapy. She will follow-up in 3-4 months and knows to call for any changes.  Thank you for allowing me to participate in her care.  Please do not hesitate to call for any questions or concerns.  The duration of this appointment visit was 25 minutes of face-to-face time with the patient.  Greater than 50% of this time was spent in counseling, explanation of diagnosis, planning of further management, and coordination of care.   Ellouise Newer, M.D.   CC: Dr. Laurance Flatten

## 2017-08-20 NOTE — Patient Instructions (Addendum)
1. Schedule EMG/NCV of left UE and LE with Dr. Posey Pronto 2. Refer to PT for balance therapy and vestibular therapy 3. Follow-up in 3-4 months, call for any changes

## 2017-09-10 ENCOUNTER — Encounter: Payer: Medicare Other | Admitting: Neurology

## 2017-09-11 ENCOUNTER — Ambulatory Visit: Payer: Medicare Other | Admitting: Physical Therapy

## 2017-09-19 ENCOUNTER — Ambulatory Visit: Payer: Medicare Other | Attending: Neurology | Admitting: Physical Therapy

## 2017-09-19 ENCOUNTER — Encounter: Payer: Self-pay | Admitting: Physical Therapy

## 2017-09-19 ENCOUNTER — Other Ambulatory Visit: Payer: Self-pay

## 2017-09-19 DIAGNOSIS — M6281 Muscle weakness (generalized): Secondary | ICD-10-CM | POA: Diagnosis present

## 2017-09-19 DIAGNOSIS — R2689 Other abnormalities of gait and mobility: Secondary | ICD-10-CM | POA: Diagnosis not present

## 2017-09-19 DIAGNOSIS — R2681 Unsteadiness on feet: Secondary | ICD-10-CM | POA: Diagnosis present

## 2017-09-19 NOTE — Therapy (Signed)
[facility] [address] [place], [place], [address] Phone: [phone]   Fax:  [phone]  Physical Therapy Evaluation  Patient Details  Name: Lynn [name] MRN: [id number] Date of Birth: [birth year] Referring Provider: Ellouise Newer, MD   Encounter Date: 09/19/2017  PT End of Session - 09/19/17 2131    Visit Number  1    Number of Visits  4    Date for PT Re-Evaluation  10/19/17    Authorization Type  [employer]    Authorization Time Period  09-19-17 - 11-17-17    PT Start Time  0845    PT Stop Time  0930    PT Time Calculation (min)  45 min       Past Medical History:  Diagnosis Date  . Arthritis   . Hypertension     Past Surgical History:  Procedure Laterality Date  . COLONOSCOPY    . FACIAL COSMETIC SURGERY    . FINGER TENDON REPAIR     middle rt hand-  . HAMMER TOE SURGERY  12/27/2011   Procedure: HAMMER TOE CORRECTION;  Surgeon: Wylene Simmer, MD;  Location: [facility];  Service: Orthopedics;  Laterality: Left;  2-4th    There were no vitals filed for this visit.   Subjective Assessment - 09/19/17 2104    Subjective  Pt reports she has had balance issues for a while (couple of years); reports it has gotten worse within past 6 months or so; is scheduled to have nerve conduction study soon; has been diagnosed with neuropathy; pt reports unsteadiness/dysequilibrium but no true spinning vertigo     Pertinent History  HTN:  Neuropathy; h/o fractured bone in foot with turning approx. 2 1/2 years ago; surgery on Lt foot to correct hammer toe and bunion; speech changes    Patient Stated Goals  "Be a little more stable and steady on my feet"         [facility] PT Assessment - 09/19/17 0857      Assessment   Medical Diagnosis  Neuropathy:  Gait abnormality due to impairment of balance    Referring Provider  Ellouise Newer, MD    Onset Date/Surgical Date  -- approx. June 2018 for progressive  decline in balance      Precautions   Precautions  Fall      Balance Screen   Has the patient fallen in the past 6 months  No    Has the patient had a decrease in activity level because of a fear of falling?   Yes    Is the patient reluctant to leave their home because of a fear of falling?   Yes      [place] residence    Type of Home  Other(Comment) townhome    Home Access  Stairs to enter    Entrance Stairs-Number of Steps  2    Entrance Stairs-Rails  Right    Home Layout  Two level    [place] - single point      Prior Function   Level of Independence  Independent with basic ADLs;Independent with household mobility without device;Needs assistance with homemaking      ROM / Strength   AROM / PROM / Strength  Strength      Strength   Overall Strength Comments  Rt hip flexor strength 4-/5 with c/o back pain:  Lt hip flexor strength 4/5;  bil. DF  strength 4+/5       Ambulation/Gait   Ambulation/Gait  Yes    Ambulation/Gait Assistance  6: Modified independent (Device/Increase time)    Ambulation Distance (Feet)  100 Feet    Assistive device  None    Gait Pattern  Step-through pattern    Ambulation Surface  Level;Indoor    Gait velocity  9.03 secs = 3.63 ft/sec      Standardized Balance Assessment   Standardized Balance Assessment  Berg Balance Test;Timed Up and Go Test      Berg Balance Test   Sit to Stand  Able to stand without using hands and stabilize independently    Standing Unsupported  Able to stand 2 minutes with supervision    Sitting with Back Unsupported but Feet Supported on Floor or Stool  Able to sit safely and securely 2 minutes    Stand to Sit  Sits safely with minimal use of hands    Transfers  Able to transfer safely, minor use of hands    Standing Unsupported with Eyes Closed  Able to stand 10 seconds with supervision    Standing Ubsupported with Feet Together  Able to place feet together independently  and stand for 1 minute with supervision    From Standing, Reach Forward with Outstretched Arm  Can reach confidently >25 cm (10")    From Standing Position, Pick up Object from Floor  Able to pick up shoe safely and easily    From Standing Position, Turn to Look Behind Over each Shoulder  Turn sideways only but maintains balance    Turn 360 Degrees  Able to turn 360 degrees safely but slowly turned to Rt and Lt sides < 4 secs but unsteady    Standing Unsupported, Alternately Place Feet on Step/Stool  Able to complete >2 steps/needs minimal assist    Standing Unsupported, One Foot in Front  Able to plae foot ahead of the other independently and hold 30 seconds    Standing on One Leg  Tries to lift leg/unable to hold 3 seconds but remains standing independently    Total Score  42      Timed Up and Go Test   Normal TUG (seconds)  9.38 no device             Objective measurements completed on examination: See above findings.              PT Education - 09/19/17 2145    Education provided  Yes    Education Details  SLS for exercise: results of eval; recommend use of SPC for safety with ambulation to reduce fall risk    Person(s) Educated  Patient    Methods  Explanation    Comprehension  Verbalized understanding          PT Long Term Goals - 09/19/17 2140      PT LONG TERM GOAL #1   Title  Independent in HEP for balance and LE strengthening and stretching.    Time  4    Period  Weeks    Status  New    Target Date  10/19/17             Plan - 09/19/17 2132    Clinical Impression Statement  Pt is a 72 yr old lady with gait and balance impairments due to neuropathy in bil. LE's.  Pt reports "dizziness or unsteadiness" with sit to stand, walking and with leaning over or closing eyes in shower.  Pt  is at risk of fall per Berg score of 42/56 and use of cane has been recommended to increase safety with gait.       History and Personal Factors relevant to plan of  care:  Neuropathy with NCV scheduled in near future;  pt reports problem with gait and balance started approx. 2 yrs ago but has progressively worsened within past 6-8 months.; pt also reports chronic ack pain    Clinical Presentation  Evolving    Clinical Presentation due to:  Neuropathy    Clinical Decision Making  Moderate    Rehab Potential  Good    PT Frequency  1x / week pt requests 1-2 follow up sessions only for HEP instruction    PT Duration  4 weeks    PT Treatment/Interventions  ADLs/Self Care Home Management;DME Instruction;Gait training;Stair training;Therapeutic activities;Therapeutic exercise;Balance training;Neuromuscular re-education;Patient/family education;Vestibular    PT Next Visit Plan  instruct in balance HEP - and stretches for heel cord    Consulted and Agree with Plan of Care  Patient       Patient will benefit from skilled therapeutic intervention in order to improve the following deficits and impairments:  Abnormal gait, Decreased balance, Impaired sensation  Visit Diagnosis: Other abnormalities of gait and mobility - Plan: PT plan of care cert/re-cert  Muscle weakness (generalized) - Plan: PT plan of care cert/re-cert  Unsteadiness on feet - Plan: PT plan of care cert/re-cert     Problem List Patient Active Problem List   Diagnosis Date Noted  . Aortic atherosclerosis ([place]) 06/26/2017  . Neuropathy 05/20/2017  . Abnormality of gait due to impairment of balance 05/20/2017  . Incontinence in female 06/25/2014  . Depression 06/25/2014  . Hearing deficit 06/25/2014  . Osteoporosis 06/24/2013  . Hypertension 05/11/2013  . Osteoarthritis 05/11/2013  . Vitamin D deficiency 05/11/2013  . Personal history of colonic polyps 05/11/2013  . Hyperlipidemia 05/11/2013    Alda Lea, PT 09/19/2017, 9:46 PM  [facility] [address] [place], [place], [address] Phone: [phone]   Fax:   [phone]  Name: Lynn [name] MRN: [id number] Date of Birth: 1946/01/21

## 2017-09-26 ENCOUNTER — Ambulatory Visit (INDEPENDENT_AMBULATORY_CARE_PROVIDER_SITE_OTHER): Payer: Medicare Other | Admitting: Neurology

## 2017-09-26 DIAGNOSIS — R2689 Other abnormalities of gait and mobility: Secondary | ICD-10-CM

## 2017-09-26 DIAGNOSIS — R42 Dizziness and giddiness: Secondary | ICD-10-CM

## 2017-09-26 DIAGNOSIS — G629 Polyneuropathy, unspecified: Secondary | ICD-10-CM | POA: Diagnosis not present

## 2017-09-26 NOTE — Procedures (Signed)
[facility] Neurology  [place], [place]  [place], [place] [address] Tel: [phone] Fax:  [phone] Test Date:  09/26/2017  Patient: Lynn [name] DOB: May 25, 1946 Physician: Narda Amber, DO  Sex: Female Height: 5\' 2"  Ref Phys: Ellouise Newer, M.D.  ID#: [id number] Temp: 34.4C Technician:    Patient Complaints: This is a 72 year-old female referred for evaluation of generalized paresthesias and imbalance.   NCV & EMG Findings: Extensive electrodiagnostic testing of the left upper and lower extremity shows:  1. Left median, ulnar, and mixed palmar sensory responses are within normal limits. 2. Left sural and superficial peroneal sensory responses are absent. 3. Left median and ulnar motor responses are within normal limits. 4. Left tibial motor response shows reduced amplitude. Left peroneal motor response is absent at the extensor digitorum brevis, and normal at the tibialis anterior. 5. Left tibial H reflex study is within normal limits. 6. In the upper extremity, there is no evidence of active or chronic motor axon loss changes affecting any of the tested muscles.  7. In the lower extremity, chronic motor axon loss changes are seen affecting the muscles below the knee as well and has the rectus femoris and abductor longus muscles.  Impression: 1. 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 2. A superimposed chronic L4 radiculopathy is also likely.    ___________________________ Narda Amber, DO    Nerve Conduction Studies Anti Sensory Summary Table   Stim Site NR Peak (ms) Norm Peak (ms) P-T Amp (V) Norm P-T Amp  Left Median Anti Sensory (2nd Digit)  34.4C  Wrist    3.4 <3.8 25.7 >10  Left Sup Peroneal Anti Sensory (Ant Lat Mall)  34.4C  12 cm NR  <4.6  >3  Left Sural Anti Sensory (Lat Mall)  34.4C  Calf NR  <4.6  >3  Left Ulnar Anti Sensory (5th Digit)   34.4C  Wrist    2.8 <3.2 13.9 >5   Motor Summary Table   Stim Site NR Onset (ms) Norm Onset (ms) O-P Amp (mV) Norm O-P Amp Site1 Site2 Delta-0 (ms) Dist (cm) Vel (m/s) Norm Vel (m/s)  Left Median Motor (Abd Poll Brev)  34.4C  Wrist    3.3 <4.0 8.2 >5 Elbow Wrist 4.7 27.0 57 >50  Elbow    8.0  7.9         Left Peroneal Motor (Ext Dig Brev)  34.4C  Ankle NR  <6.0  >2.5 B Fib Ankle  0.0  >40  B Fib NR     Poplt B Fib  0.0  >40  Poplt NR            Left Peroneal TA Motor (Tib Ant)  34.4C  Fib Head    2.4 <4.5 4.2 >3 Poplit Fib Head 1.4 8.0 57 >40  Poplit    3.8  4.0         Left Tibial Motor (Abd Hall Brev)  34.4C  Ankle    3.7 <6.0 3.5 >4 Knee Ankle 7.5 35.0 47 >40  Knee    11.2  2.8         Left Ulnar Motor (Abd Dig Minimi)  34.4C  Wrist    2.3 <3.1 10.2 >7 B Elbow Wrist 3.5 23.0 66 >50  B Elbow    5.8  9.7  A Elbow B Elbow 1.5 10.0 67 >50  A Elbow    7.3  9.1  Comparison Summary Table   Stim Site NR Peak (ms) Norm Peak (ms) P-T Amp (V) Site1 Site2 Delta-P (ms) Norm Delta (ms)  Left Median/Ulnar Palm Comparison (Wrist - 8cm)  34.4C  Median Palm    1.7 <2.2 38.3 Median Palm Ulnar Palm 0.1   Ulnar Palm    1.6 <2.2 10.0       H Reflex Studies   NR H-Lat (ms) Lat Norm (ms) L-R H-Lat (ms)  Left Tibial (Gastroc)  34.4C     31.97 <35    EMG   Side Muscle Ins Act Fibs Psw Fasc Number Recrt Dur Dur. Amp Amp. Poly Poly. Comment  Left AntTibialis Nml Nml Nml Nml 1- Rapid Few 1+ Few 1+ Nml Nml N/A  Left Gastroc Nml Nml Nml Nml 2- Rapid Few 1+ Few 1+ Nml Nml N/A  Left Flex Dig Long Nml Nml Nml Nml 2- Rapid Some 1+ Some 1+ Nml Nml N/A  Left RectFemoris Nml Nml Nml Nml 1- Rapid Some 1+ Some 1+ Nml Nml N/A  Left GluteusMed Nml Nml Nml Nml Nml Nml Nml Nml Nml Nml Nml Nml N/A  Left AdductorLong Nml Nml Nml Nml 1- Rapid Some 1+ Some 1+ Nml Nml N/A  Left BicepsFemS Nml Nml Nml Nml Nml Nml Nml Nml Nml Nml Nml Nml N/A  Left 1stDorInt Nml Nml Nml Nml Nml Nml Nml Nml Nml Nml Nml  Nml N/A  Left PronatorTeres Nml Nml Nml Nml Nml Nml Nml Nml Nml Nml Nml Nml N/A  Left Biceps Nml Nml Nml Nml Nml Nml Nml Nml Nml Nml Nml Nml N/A  Left Deltoid Nml Nml Nml Nml Nml Nml Nml Nml Nml Nml Nml Nml N/A  Left Triceps Nml Nml Nml Nml Nml Nml Nml Nml Nml Nml Nml Nml N/A  Left Lumbo Parasp Low Nml Nml Nml Nml Nml Nml Nml Nml Nml Nml Nml Nml N/A      Waveforms:

## 2017-10-03 ENCOUNTER — Telehealth: Payer: Self-pay

## 2017-10-03 NOTE — Telephone Encounter (Signed)
Spoke with pt relaying message below.   

## 2017-10-03 NOTE — Telephone Encounter (Signed)
-----   Message from Cameron Sprang, MD sent at 10/01/2017 10:29 AM EDT ----- Pls let her know the nerve test confirmed neuropathy, which can cause imbalance. Treatment would still be balance therapy,which she has started, continue with balance therapy. Thanks

## 2017-10-04 ENCOUNTER — Telehealth: Payer: Self-pay | Admitting: Family Medicine

## 2017-10-05 NOTE — Telephone Encounter (Signed)
Have worked on prior [employer]

## 2017-10-07 ENCOUNTER — Ambulatory Visit: Payer: Medicare Other | Attending: Neurology | Admitting: Physical Therapy

## 2017-10-07 DIAGNOSIS — R2689 Other abnormalities of gait and mobility: Secondary | ICD-10-CM | POA: Insufficient documentation

## 2017-10-07 DIAGNOSIS — M6281 Muscle weakness (generalized): Secondary | ICD-10-CM

## 2017-10-07 DIAGNOSIS — R2681 Unsteadiness on feet: Secondary | ICD-10-CM | POA: Diagnosis not present

## 2017-10-07 NOTE — Patient Instructions (Addendum)
Standing Marching   Using a chair if necessary, march in place. Repeat 10-15  Times each leg SLOWLY -  Do 1 sessions per day.   Hip Backward Kick   Using a chair for balance, keep legs shoulder width apart and toes pointed for- ward. Slowly extend one leg back, keeping knee straight. Do not lean forward. Repeat with other leg. Repeat 10  times. Do 1 sessions per day.  http://gt2.exer.us/340   Copyright  VHI. All rights reserved.     Hip Side Kick   Holding a chair for balance, keep legs shoulder width apart and toes pointed forward. Swing a leg out to side, keeping knee straight. Do not lean. Repeat using other leg. Repeat 10  times. Do 1 sessions per day.  ALSO DO FORWARD KICK - ALTERNATE LEGS - 10x each leg    TANDEM STANCE -partial heel to toe    With eyes open, right foot directly in front of the other, arms out, look straight ahead at a stationary object. Hold 30 seconds. Repeat 1  times per session. Do 1 sessions per day.      Standing On One Leg Without Support - HOLD AS NEEDED .  Stand on one leg in neutral spine without support. Hold 10 seconds. Repeat on other leg. Do 1 repetitions, 1-2 sets.  http://bt.exer.us/36   Copyright  VHI. All rights reserved.    Side-Stepping   Walk to left side with eyes open. Take even steps, leading with same foot. Make sure each foot lifts off the floor. Repeat in opposite direction. Repeat for 1 minutes per session. Do 2 sessions per day.  Copyright  VHI. All rights reserved.       Heel Raise: Bilateral (Standing)  HOLD ONTO COUNTER   Rise on balls of feet. Repeat __10__ times per set. Do _1___ sets per session. Do __1__ sessions per day.  HOLD 3 secs  ALSO DO 1 LEG ONLY! - 10 reps each leg  http://orth.exer.us/39   Copyright  VHI. All rights reserved.

## 2017-10-08 ENCOUNTER — Encounter: Payer: Self-pay | Admitting: Physical Therapy

## 2017-10-08 ENCOUNTER — Telehealth: Payer: Self-pay

## 2017-10-08 NOTE — Telephone Encounter (Signed)
Please see if her insurance would pay for Prolia.  She has osteoporosis and she has osteoarthritis and would have problems with taking bisphosphonates.

## 2017-10-08 NOTE — Telephone Encounter (Signed)
Raloxifene was denied for tier exception  Plans formulary are alendronate and ibandronate

## 2017-10-08 NOTE — Telephone Encounter (Signed)
Please review and advise.

## 2017-10-08 NOTE — Therapy (Addendum)
[facility] [address] [place], [place], [address] Phone: [phone]   Fax:  [phone]  Physical Therapy Treatment  Patient Details  Name: EVIANA SIBILIA MRN: [id number] Date of Birth: [birth year] Referring Provider: Ellouise Newer, MD   Encounter Date: 10/07/2017  PT End of Session - 10/08/17 2146    Visit Number  2    Number of Visits  4    Date for PT Re-Evaluation  10/19/17    Authorization Type  [employer]    Authorization Time Period  09-19-17 - 11-17-17    PT Start Time  1405    PT Stop Time  1450    PT Time Calculation (min)  45 min       Past Medical History:  Diagnosis Date  . Arthritis   . Hypertension     Past Surgical History:  Procedure Laterality Date  . COLONOSCOPY    . FACIAL COSMETIC SURGERY    . FINGER TENDON REPAIR     middle rt hand-  . HAMMER TOE SURGERY  12/27/2011   Procedure: HAMMER TOE CORRECTION;  Surgeon: Wylene Simmer, MD;  Location: [facility];  Service: Orthopedics;  Laterality: Left;  2-4th    There were no vitals filed for this visit.  Subjective Assessment - 10/08/17 2144    Subjective  Pt states she saw Dr. Posey Pronto - had NCV test done which confirmed peripheral neuropathy in her legs    Pertinent History  HTN:  Neuropathy; h/o fractured bone in foot with turning approx. 2 1/2 years ago; surgery on Lt foot to correct hammer toe and bunion; speech changes    Patient Stated Goals  "Be a little more stable and steady on my feet"                      [facility] Adult PT Treatment/Exercise - 10/09/17 0001      Transfers   Transfers  Sit to Stand    Number of Reps  Other reps (comment) 5    Comments  no UE support used - pt performed from mat      Exercises   Exercises  Knee/Hip;Ankle      Knee/Hip Exercises: Aerobic   Recumbent Bike  SciFit level 1.6 x 5" with UE's and LE's      Knee/Hip Exercises: Standing   Heel Raises  Both;1 set;10  reps      Ankle Exercises: Standing   Toe Raise  10 reps    Toe Walk (Round Trip)  1 rep      Pt performed balance exercises for HEP; forward, back and side kicks x 10 reps each; marching in place, then forward and backward Sidestepping, crossovers, stepping behind, braiding, tandem and SLS - performed with UE support prn       PT Education - 10/09/17 1601    Education provided  Yes    Education Details  pt instructed in balance HEP and also heel raises added to HEP for ankle strengthening    Person(s) Educated  Patient    Methods  Explanation;Handout;Demonstration    Comprehension  Verbalized understanding;Returned demonstration          PT Long Term Goals - 09/19/17 2140      PT LONG TERM GOAL #1   Title  Independent in HEP for balance and LE strengthening and stretching.    Time  4    Period  Weeks    Status  New    Target Date  10/19/17            Plan - 10/09/17 1602    Clinical Impression Statement  Pt presents with high level balance skills due to peripheral neuropathy; pt not using cane for assistance with ambulation, although this device continues to be recommended to pt to reduce fall risk, especially with community ambulation.    PT Frequency  1x / week    PT Duration  4 weeks    PT Treatment/Interventions  ADLs/Self Care Home Management;DME Instruction;Gait training;Stair training;Therapeutic activities;Therapeutic exercise;Balance training;Neuromuscular re-education;Patient/family education;Vestibular    PT Next Visit Plan  give sheet with stretches for hamstring and heel cord to add to HEP; check balance exercises    Consulted and Agree with Plan of Care  Patient       Patient will benefit from skilled therapeutic intervention in order to improve the following deficits and impairments:  Abnormal gait, Decreased balance, Impaired sensation  Visit Diagnosis: Unsteadiness on feet  Other abnormalities of gait and mobility  Muscle weakness  (generalized)     Problem List Patient Active Problem List   Diagnosis Date Noted  . Aortic atherosclerosis ([place]) 06/26/2017  . Neuropathy 05/20/2017  . Abnormality of gait due to impairment of balance 05/20/2017  . Incontinence in female 06/25/2014  . Depression 06/25/2014  . Hearing deficit 06/25/2014  . Osteoporosis 06/24/2013  . Hypertension 05/11/2013  . Osteoarthritis 05/11/2013  . Vitamin D deficiency 05/11/2013  . Personal history of colonic polyps 05/11/2013  . Hyperlipidemia 05/11/2013    Alda Lea, PT 10/09/2017, 4:07 PM  [facility] [address] [place], [place], [address] Phone: [phone]   Fax:  [phone]  Name: NORRINE BALLESTER MRN: [id number] Date of Birth: [birth year]

## 2017-10-10 ENCOUNTER — Telehealth: Payer: Self-pay

## 2017-10-10 NOTE — Telephone Encounter (Signed)
Need a phone call about what I can change my Raloxifene to because it's too expensive

## 2017-10-11 NOTE — Telephone Encounter (Signed)
Please call the patient with the recommendation that there is no other alternative to Evista.  Another medicine would be a bisphosphonate.  This however could irritate the esophagus and with her history of arthritic issues I do not want to do that.  A better alternative would be to take Prolia.  This is an injection that she would have to get twice yearly.  We will check with her insurance to see if it will pay for this.

## 2017-10-16 ENCOUNTER — Ambulatory Visit: Payer: Medicare Other | Admitting: Physical Therapy

## 2017-10-22 ENCOUNTER — Ambulatory Visit: Payer: Medicare Other | Attending: Neurology | Admitting: Physical Therapy

## 2017-10-22 DIAGNOSIS — R2681 Unsteadiness on feet: Secondary | ICD-10-CM | POA: Insufficient documentation

## 2017-10-22 DIAGNOSIS — R2689 Other abnormalities of gait and mobility: Secondary | ICD-10-CM | POA: Diagnosis present

## 2017-10-23 ENCOUNTER — Encounter: Payer: Self-pay | Admitting: Physical Therapy

## 2017-10-23 NOTE — Therapy (Signed)
[facility] [address] [place], [place], [address] Phone: [phone]   Fax:  [phone]  Physical Therapy Treatment  Patient Details  Name: Lynn [name] MRN: [id number] Date of Birth: May 18, 1946 Referring Provider: Ellouise Newer, MD   Encounter Date: 10/22/2017  PT End of Session - 10/23/17 1526    Visit Number  3    Number of Visits  4    Date for PT Re-Evaluation  10/19/17    Authorization Type  Blue Medicare    Authorization Time Period  09-19-17 - 11-17-17    PT Start Time  1315    PT Stop Time  1400    PT Time Calculation (min)  45 min       Past Medical History:  Diagnosis Date  . Arthritis   . Hypertension     Past Surgical History:  Procedure Laterality Date  . COLONOSCOPY    . FACIAL COSMETIC SURGERY    . FINGER TENDON REPAIR     middle rt hand-  . HAMMER TOE SURGERY  12/27/2011   Procedure: HAMMER TOE CORRECTION;  Surgeon: Wylene Simmer, MD;  Location: [facility];  Service: Orthopedics;  Laterality: Left;  2-4th    There were no vitals filed for this visit.  Subjective Assessment - 10/23/17 1514    Subjective  Pt reports Rt hip pain that has gotten worse today - had a twinge yesterday    Pertinent History  HTN:  Neuropathy; h/o fractured bone in foot with turning approx. 2 1/2 years ago; surgery on Lt foot to correct hammer toe and bunion; speech changes    Patient Stated Goals  "Be a little more stable and steady on my feet"                       [facility] Adult PT Treatment/Exercise - 10/23/17 0001      Knee/Hip Exercises: Stretches   Active Hamstring Stretch  Both;1 rep;30 seconds    Gastroc Stretch  Both;1 rep;30 seconds    Other Knee/Hip Stretches  Runner's stretch in standing for hamstring stretch      Knee/Hip Exercises: Aerobic   Recumbent Bike  SciFit level 1.5 x 5" with UE's and LE's      Knee/Hip Exercises: Standing   Heel Raises  Both;1 set;10  reps          Balance Exercises - 10/23/17 1525      Balance Exercises: Standing   Standing Eyes Opened  Narrow base of support (BOS);Wide (BOA);Head turns;Foam/compliant surface;5 reps    Standing Eyes Closed  Narrow base of support (BOS);Wide (BOA);Head turns;Foam/compliant surface;5 reps    Tandem Stance  Eyes open;Foam/compliant surface;5 reps    Rockerboard  Anterior/posterior;Lateral;EO;10 reps    Other Standing Exercises  Pt performed stepping over balance beam with RLE only due to increased pain with Rt SLS - UE support minimal with CGA             PT Long Term Goals - 09/19/17 2140      PT LONG TERM GOAL #1   Title  Independent in HEP for balance and LE strengthening and stretching.    Time  4    Period  Weeks    Status  New    Target Date  10/19/17            Plan - 10/23/17 1527    Clinical Impression Statement  Pt's ability to fully participate  in standing balance activities limited by c/o new onset of Rt hip pain - pt states pain may be coming from her back;  I suggested to pt that she may need to cancel today's appt due to this new onset of pain of unknown etiology but pt stated she wanted to try and see what she could do    Rehab Potential  Good    PT Frequency  1x / week    PT Duration  4 weeks    PT Treatment/Interventions  ADLs/Self Care Home Management;DME Instruction;Gait training;Stair training;Therapeutic activities;Therapeutic exercise;Balance training;Neuromuscular re-education;Patient/family education;Vestibular    PT Next Visit Plan  cont balance and LE strengthening - D/C next visit?    Consulted and Agree with Plan of Care  Patient       Patient will benefit from skilled therapeutic intervention in order to improve the following deficits and impairments:  Abnormal gait, Decreased balance, Impaired sensation  Visit Diagnosis: Unsteadiness on feet  Other abnormalities of gait and mobility     Problem List Patient Active Problem  List   Diagnosis Date Noted  . Aortic atherosclerosis ([place]) 06/26/2017  . Neuropathy 05/20/2017  . Abnormality of gait due to impairment of balance 05/20/2017  . Incontinence in female 06/25/2014  . Depression 06/25/2014  . Hearing deficit 06/25/2014  . Osteoporosis 06/24/2013  . Hypertension 05/11/2013  . Osteoarthritis 05/11/2013  . Vitamin D deficiency 05/11/2013  . Personal history of colonic polyps 05/11/2013  . Hyperlipidemia 05/11/2013    Alda Lea, PT 10/23/2017, 4:09 PM  [facility] [address] [place], [place], [address] Phone: [phone]   Fax:  [phone]  Name: Lynn [name] MRN: [id number] Date of Birth: [birth year]

## 2017-10-23 NOTE — Patient Instructions (Signed)
Gastroc Stretch    Stand with right foot back, leg straight, forward leg bent. Keeping heel on floor, turned slightly out, lean into wall until stretch is felt in calf. Hold _30_ seconds. Repeat __1__ times per set. Do __1__ sets per session. Do __2__ sessions per day.  http://orth.exer.us/27   Copyright  VHI. All rights reserved.

## 2017-11-07 ENCOUNTER — Ambulatory Visit: Payer: Medicare Other | Admitting: Physical Therapy

## 2017-11-12 ENCOUNTER — Ambulatory Visit: Payer: Medicare Other | Admitting: Neurology

## 2017-11-22 ENCOUNTER — Encounter: Payer: Self-pay | Admitting: Neurology

## 2017-11-22 ENCOUNTER — Ambulatory Visit: Payer: Medicare Other | Admitting: Neurology

## 2017-11-22 VITALS — BP 110/80 | HR 91 | Wt 129.0 lb

## 2017-11-22 DIAGNOSIS — M5417 Radiculopathy, lumbosacral region: Secondary | ICD-10-CM

## 2017-11-22 DIAGNOSIS — R2689 Other abnormalities of gait and mobility: Secondary | ICD-10-CM

## 2017-11-22 DIAGNOSIS — G629 Polyneuropathy, unspecified: Secondary | ICD-10-CM | POA: Diagnosis not present

## 2017-11-22 NOTE — Patient Instructions (Signed)
1. Restart Balance Therapy 2. If pain becomes more and more of an issue, call our office so we can send referral to Dr. Harley Hallmark office for pain management 3. If sharp pains in feet get worse, consider starting low dose medication to help stabilize nerve membranes such as gabapentin 4. Consider a second opinion from our Movement Disorder specialist, Dr. Carles Collet 5. Follow-up in 6 months, call for any changes

## 2017-11-22 NOTE — Progress Notes (Signed)
NEUROLOGY FOLLOW UP OFFICE NOTE  CORTEZ STEELMAN [id number] [birth year]  HISTORY OF PRESENT ILLNESS: I had the pleasure of seeing Oaklynn Stierwalt in follow-up in the neurology clinic on 12/02/2017.  The patient was last seen 4 months ago for gait imbalance and dizziness. Neuropathy labs were normal. She had an EMG/NCV confirming a chronic axonal sensorimotor polyneuropathy and a superimposed chronic left L4 radiculopathy. She had tried Balance therapy but had to cut it short due to pain, she continues to notice balance issues when standing, but also when she is sitting she feels off balance. She has occasional sharp pains in her feet. She is again noted to have speech changes where she needs to take deep breaths in between talking, and states she feels anxious. She denies any difficulty breathing, sometimes she gets choked when swallowing. She continues to notice twitching in her arms, and notices that her left foot turns in when she lifts her legs and stretches then, taking a long time to relax her foot. She has neck and back pain, no bowel/bladder dysfunction.   HPI 05/09/2017: This is a pleasant 72 yo RH woman with a history of hypertension, who presented for evaluation of gait unsteadiness. She had been seeing Neurosurgery for back pain. She reported symptoms started a couple of years ago, but worsened in the past 6-8 months. She had fractured her left foot and was doing PT, noting difficulties with heel-toe walking, worse when she closes her eyes. She has come very close to falling, she was drying her hair bending forward and pitched forward. She has numbness and tingling in both hands and feet up to the mid-calf region. She has occasional sharp pains in both feet. Over the past couple of years, she has noticed clumsiness in both hands, worse the past year. If she does much lifting, she starts having back pain. She denies any neck pain except when bending head forward too much. She has some  stress incontinence, no bowel dysfunction. She has occasional headaches associated with eye issues. She has a little blurred vision with dry eye. Over the past 1.5 years, she has noticed a change in her speech, her voice has thickened and she is more hesitant when talking, worse this past year. She has also noticed some anxiety worsens it as well. She occasionally chokes on her food. She denies any family history of similar symptoms. She had an MRI brain without contrast at [facility] last 02/28/17, images unavailable for review, there were no acute changes. There was mild FLAIR white matter changes, normal brain volume.   PAST MEDICAL HISTORY: Past Medical History:  Diagnosis Date  . Arthritis   . Hypertension     MEDICATIONS: Current Outpatient Medications on File Prior to Visit  Medication Sig Dispense Refill  . aspirin 81 MG tablet Take 81 mg by mouth daily.    . calcium carbonate (OS-CAL) 600 MG TABS tablet Take 600 mg by mouth daily with breakfast.     . diclofenac (VOLTAREN) 75 MG EC tablet Take 1 tablet (75 mg total) by mouth 2 (two) times daily. 60 tablet 3  . DULoxetine (CYMBALTA) 30 MG capsule TAKE 1 CAPSULE BY MOUTH DAILY 90 capsule 0  . hydrochlorothiazide (MICROZIDE) 12.5 MG capsule Take 1 capsule (12.5 mg total) by mouth daily. 90 capsule 3  . losartan (COZAAR) 50 MG tablet Take 1 tablet (50 mg total) by mouth daily. 90 tablet 3  . raloxifene (EVISTA) 60 MG tablet Take 60 mg by mouth daily.    Marland Kitchen  Vitamin D, Ergocalciferol, (DRISDOL) 50000 units CAPS capsule Take 1 capsule (50,000 Units total) by mouth every 7 (seven) days. 12 capsule 3   No current facility-administered medications on file prior to visit.     ALLERGIES: No Known Allergies  FAMILY HISTORY: Family History  Problem Relation Age of Onset  . Colon cancer Father   . Esophageal cancer Neg Hx   . Rectal cancer Neg Hx   . Stomach cancer Neg Hx     SOCIAL HISTORY: Social History   Socioeconomic  History  . Marital status: Divorced    Spouse name: Not on file  . Number of children: 1  . Years of education: 23  . Highest education level: Not on file  Occupational History  . Not on file  Social Needs  . Financial resource strain: Not on file  . Food insecurity:    Worry: Not on file    Inability: Not on file  . Transportation needs:    Medical: Not on file    Non-medical: Not on file  Tobacco Use  . Smoking status: Former Smoker    Last attempt to quit: 12/21/1975    Years since quitting: 41.9  . Smokeless tobacco: Never Used  Substance and Sexual Activity  . Alcohol use: Yes    Comment: occ  . Drug use: No  . Sexual activity: Not on file  Lifestyle  . Physical activity:    Days per week: Not on file    Minutes per session: Not on file  . Stress: Not on file  Relationships  . Social connections:    Talks on phone: Not on file    Gets together: Not on file    Attends religious service: Not on file    Active member of club or organization: Not on file    Attends meetings of clubs or organizations: Not on file    Relationship status: Not on file  . Intimate partner violence:    Fear of current or ex partner: Not on file    Emotionally abused: Not on file    Physically abused: Not on file    Forced sexual activity: Not on file  Other Topics Concern  . Not on file  Social History Narrative   Patient lives in a 2 story home.  Her mother lives with her.  Children: 1 son.  Retired from [employer].  Education: college.     REVIEW OF SYSTEMS: Constitutional: No fevers, chills, or sweats, no generalized fatigue, change in appetite Eyes: No visual changes, double vision, eye pain Ear, nose and throat: No hearing loss, ear pain, nasal congestion, sore throat Cardiovascular: No chest pain, palpitations Respiratory:  No shortness of breath at rest or with exertion, wheezes GastrointestinaI: No nausea, vomiting, diarrhea, abdominal pain, fecal incontinence Genitourinary:  No  dysuria, urinary retention or frequency Musculoskeletal:  + neck pain, back pain Integumentary: No rash, pruritus, skin lesions Neurological: as above Psychiatric: No depression, insomnia, anxiety Endocrine: No palpitations, fatigue, diaphoresis, mood swings, change in appetite, change in weight, increased thirst Hematologic/Lymphatic:  No anemia, purpura, petechiae. Allergic/Immunologic: no itchy/runny eyes, nasal congestion, recent allergic reactions, rashes  PHYSICAL EXAM: Vitals:   11/22/17 1133  BP: 110/80  Pulse: 91  SpO2: 91%   General: No acute distress, she is noted again to have hesitant speech, ?spasmodic dysphonia Head:  Normocephalic/atraumatic Neck: supple, no paraspinal tenderness, full range of motion Heart:  Regular rate and rhythm Lungs:  Clear to auscultation bilaterally Back: No paraspinal tenderness  Skin/Extremities: No rash, no edema Neurological Exam: alert and oriented to person, place, and time. No aphasia or dysarthria. Fund of knowledge is appropriate.  Recent and remote memory are intact.  Attention and concentration are normal.    Able to name objects and repeat phrases. Cranial nerves: Pupils equal, round, reactive to light.  Extraocular movements intact with no nystagmus. Visual fields full. Facial sensation intact. No facial asymmetry. Tongue, uvula, palate midline.  Motor: Bulk and tone normal, no cogwheeling, no fasciculations noted today, muscle strength 5/5 throughout with no pronator drift.  Sensation decreased on both LE. +Romberg test. Deep tendon reflexes +1 on both UE, unable to elicit on both LE. Toes downgoing.  Finger to nose testing intact. Able to rise with arms crossed over chest.  Gait narrow-based and steady with good arm swing, unable to tandem walk adequately. Negative pull test. She has slow finger taps reporting hand pain, good foot taps.  IMPRESSION: This is a pleasant 72 yo RH woman with a history of hypertension, presenting for  evaluation of gait unsteadiness. She has also noticed a change in speech pattern over the past year. Neurological exam shows evidence of a length-dependent neuropathy in both hands and legs. MRI brain unremarkable. EMG/NCV confirmed neuropathy, neuropathy labs normal. We again discussed how neuropathy can cause gait instability, however she feels the unbalanced sensation even while sitting. She reports her left foot occasionally turns in when she stretches it. She also has had speech changes (?spasmodic dysphonia), and reports muscle movements. We discussed continuing with Balance therapy for gait instability due to neuropathy. She would like to hold off on gabapentin for now, she reports occasional sharp pains in her feet. I discussed with her that we can get a second opinion with our Movement Disorders specialist Dr. Carles Collet regarding the speech changes and ?foot dystonia, she would like to hold off for now. She feels pain is a big issues hindering PT, if this progresses, referral to Dr. Harley Hallmark office where she had injections in the past will be sent. She will follow-up in 6 months and knows to call for any changes.  Thank you for allowing me to participate in her care.  Please do not hesitate to call for any questions or concerns.  The duration of this appointment visit was 25 minutes of face-to-face time with the patient.  Greater than 50% of this time was spent in counseling, explanation of diagnosis, planning of further management, and coordination of care.   Ellouise Newer, M.D.   CC: Dr. Laurance Flatten

## 2017-12-02 ENCOUNTER — Encounter: Payer: Self-pay | Admitting: Neurology

## 2017-12-20 ENCOUNTER — Telehealth: Payer: Self-pay | Admitting: *Deleted

## 2017-12-20 NOTE — Telephone Encounter (Signed)
Insurance verification submitted for Prolia 

## 2017-12-23 ENCOUNTER — Other Ambulatory Visit: Payer: Self-pay | Admitting: Family Medicine

## 2017-12-25 ENCOUNTER — Encounter: Payer: Self-pay | Admitting: Family Medicine

## 2017-12-25 ENCOUNTER — Ambulatory Visit (INDEPENDENT_AMBULATORY_CARE_PROVIDER_SITE_OTHER): Payer: Medicare Other

## 2017-12-25 ENCOUNTER — Ambulatory Visit: Payer: Medicare Other | Admitting: Family Medicine

## 2017-12-25 VITALS — BP 109/75 | HR 71 | Temp 97.9°F | Ht 62.5 in | Wt 128.0 lb

## 2017-12-25 DIAGNOSIS — Z13820 Encounter for screening for osteoporosis: Secondary | ICD-10-CM

## 2017-12-25 DIAGNOSIS — F329 Major depressive disorder, single episode, unspecified: Secondary | ICD-10-CM

## 2017-12-25 DIAGNOSIS — M159 Polyosteoarthritis, unspecified: Secondary | ICD-10-CM

## 2017-12-25 DIAGNOSIS — E559 Vitamin D deficiency, unspecified: Secondary | ICD-10-CM

## 2017-12-25 DIAGNOSIS — E785 Hyperlipidemia, unspecified: Secondary | ICD-10-CM

## 2017-12-25 DIAGNOSIS — M4306 Spondylolysis, lumbar region: Secondary | ICD-10-CM

## 2017-12-25 DIAGNOSIS — M150 Primary generalized (osteo)arthritis: Secondary | ICD-10-CM | POA: Diagnosis not present

## 2017-12-25 DIAGNOSIS — I10 Essential (primary) hypertension: Secondary | ICD-10-CM | POA: Diagnosis not present

## 2017-12-25 DIAGNOSIS — Z780 Asymptomatic menopausal state: Secondary | ICD-10-CM

## 2017-12-25 DIAGNOSIS — F32A Depression, unspecified: Secondary | ICD-10-CM

## 2017-12-25 DIAGNOSIS — R2681 Unsteadiness on feet: Secondary | ICD-10-CM

## 2017-12-25 DIAGNOSIS — F419 Anxiety disorder, unspecified: Secondary | ICD-10-CM | POA: Diagnosis not present

## 2017-12-25 DIAGNOSIS — G609 Hereditary and idiopathic neuropathy, unspecified: Secondary | ICD-10-CM

## 2017-12-25 MED ORDER — DICLOFENAC SODIUM 75 MG PO TBEC: 75.0000 mg | DELAYED_RELEASE_TABLET | Freq: Two times a day (BID) | ORAL | 3 refills | Status: DC

## 2017-12-25 MED ORDER — LOSARTAN POTASSIUM 50 MG PO TABS: 50.0000 mg | ORAL_TABLET | Freq: Every day | ORAL | 3 refills | Status: DC

## 2017-12-25 MED ORDER — HYDROCHLOROTHIAZIDE 12.5 MG PO CAPS
ORAL_CAPSULE | ORAL | 3 refills | Status: DC
Start: 2017-12-25 — End: 2019-04-27

## 2017-12-25 NOTE — Patient Instructions (Addendum)
Medicare Annual Wellness Visit  [facility] and the medical providers at [facility] strive to bring you the best medical care.  In doing so we not only want to address your current medical conditions and concerns but also to detect new conditions early and prevent illness, disease and health-related problems.    Medicare offers a yearly Wellness Visit which allows our clinical staff to assess your need for preventative services including immunizations, lifestyle education, counseling to decrease risk of preventable diseases and screening for fall risk and other medical concerns.    This visit is provided free of charge (no copay) for all Medicare recipients. The clinical pharmacists at [facility] have begun to conduct these Wellness Visits which will also include a thorough review of all your medications.    As you primary medical provider recommend that you make an appointment for your Annual Wellness Visit if you have not done so already this year.  You may set up this appointment before you leave today or you may call back ([phone]) and schedule an appointment.  Please make sure when you call that you mention that you are scheduling your Annual Wellness Visit with the clinical pharmacist so that the appointment may be made for the proper length of time.    Continue current medications. Continue good therapeutic lifestyle changes which include good diet and exercise. Fall precautions discussed with patient. If an FOBT was given today- please return it to our front desk. If you are over 51 years old - you may need Prevnar 11 or the adult Pneumonia vaccine.  **Flu shots are available--- please call and schedule a FLU-CLINIC appointment**  After your visit with [place] today you will receive a survey in the mail or online from [employer] regarding your care with [place]. Please take a moment to fill this out. Your feedback is very  important to [place] as you can help [place] better understand your patient needs as well as improve your experience and satisfaction. WE CARE ABOUT YOU!!!   The patient should continue with her physical therapy. If the pain gets worse she should call us back and we will make a referral to the neurosurgery group that Dr. Joya Salm used to be a part of. Also if she continues with the dystonia and painful movements we should follow through with the recommendation of the neurologist to the doctor who does a movement disorders, Dr. Carles Collet. She should continue with as aggressive therapeutic lifestyle changes as possible which include diet and exercise which could include nothing more than walking regularly several times a day  Take zantac otc 150  - before breakfast and before supper if eating out and not knowing what you are going to be eating If the trouble swallowing gets worse please get back in touch with [place] Avoid caffeine and fried foods as much as possible Please let us know if you decide to go to Dr Overton [facility] group and we will refer you. We will call you about the final result of your bone dexa and discuss possible meds at that point. Marya Amsler is the counselor that works with [facility] and if you decide you want to set up an appointment to see her, please call my nurse and she will arrange for this appointment You will get scheduled for a pelvic exam this fall Make sure that you get your eye exam regularly If you decide you want to try the gabapentin to help with neuropathy call us and  we can try this starting at bedtime at a low dose.

## 2017-12-25 NOTE — Progress Notes (Signed)
Subjective:    Patient ID: Lynn [name], female    DOB: 1946-04-11, 72 y.o.   MRN: [id number]  HPI Pt here for follow up and management of chronic medical problems which includes hypertension and hyperlipidemia. She is taking medication regularly.  The patient recently had a visit with Dr. Delice Lesch, a neurologist.  Her impression was very enlightening and a good summary of what is going on with Lynn [name].  She has her gait unsteadiness and her change in speech pattern along with length dependent neuropathy in both hands and legs.  The brain MRI was unremarkable and an EMG with nerve conduction velocity testing confirmed neuropathy but neuropathy labs were normal.  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  Dr. Delice Lesch felt that if the problem continued with her gait instability and imbalance situation that she might benefit from seeing Dr. Carles Collet who deals with with movement disorders.  In the meantime she would like to continue with her physical therapy unless the pain gets so bad with her movements that she needs to go back and see the neurologist or the neurosurgeon for injections and this would be with Dr. Harley Hallmark office and he has retired.  She is requesting refills on several of her medicines.  Her blood pressure is good today and her BMI is good at 23.22.  In addition, multiple x-rays of the spine have shown severe osteoarthritis.  The patient does take losartan 50 mg vitamin D 50,000 units HCTZ 12-1/2 mg along with a coated baby aspirin.  The information from Dr. Delice Lesch was reviewed with the patient and she agreed with the majority of this.  She understands that if she keeps having movement problems despite doing physical therapy that she probably should go see the movement disorder clinic Dr., Dr. Carles Collet.  She also understands if she keeps having pain that she should reconsider going back and seeing the neurosurgery group that might do some injections.  Another  alternative which I think is a good alternative would be to try some gabapentin and I explained how that works with her also she had questions about her pelvic exam and we will make sure she gets that done this fall.  She also mentions she she does have some trouble swallowing and she does continue to take Voltaren.  She should pare some ranitidine before breakfast and take the Voltaren after breakfast if she is going to take Voltaren.  And she could take an additional ranitidine or Zantac before she eats dinner at nighttime if she needs it.  She is also go to make sure she gets her eye exam done at the proper time.  If she decides to try the Neurontin we will certainly put her on some gabapentin letter try that for chronic pain.   Patient Active Problem List   Diagnosis Date Noted  . Aortic atherosclerosis ([place]) 06/26/2017  . Neuropathy 05/20/2017  . Abnormality of gait due to impairment of balance 05/20/2017  . Incontinence in female 06/25/2014  . Depression 06/25/2014  . Hearing deficit 06/25/2014  . Osteoporosis 06/24/2013  . Hypertension 05/11/2013  . Osteoarthritis 05/11/2013  . Vitamin D deficiency 05/11/2013  . Personal history of colonic polyps 05/11/2013  . Hyperlipidemia 05/11/2013   Outpatient Encounter Medications as of 12/25/2017  Medication Sig  . aspirin 81 MG tablet Take 81 mg by mouth daily.  . calcium carbonate (OS-CAL) 600 MG TABS tablet Take 600 mg by mouth daily with breakfast.   . diclofenac (VOLTAREN)  75 MG EC tablet Take 1 tablet (75 mg total) by mouth 2 (two) times daily.  . hydrochlorothiazide (MICROZIDE) 12.5 MG capsule TAKE 1 CAPSULE(12.5 MG) BY MOUTH DAILY  . losartan (COZAAR) 50 MG tablet TAKE 1 TABLET BY MOUTH EVERY DAY  . raloxifene (EVISTA) 60 MG tablet Take 60 mg by mouth daily.  . Vitamin D, Ergocalciferol, (DRISDOL) 50000 units CAPS capsule Take 1 capsule (50,000 Units total) by mouth every 7 (seven) days.  . [DISCONTINUED] DULoxetine (CYMBALTA) 30 MG  capsule TAKE 1 CAPSULE BY MOUTH DAILY (Patient not taking: Reported on 12/25/2017)   No facility-administered encounter medications on file as of 12/25/2017.       Review of Systems  Constitutional: Negative.   HENT: Negative.   Eyes: Negative.   Respiratory: Negative.   Cardiovascular: Negative.   Gastrointestinal: Negative.   Endocrine: Negative.   Genitourinary: Negative.   Musculoskeletal: Positive for arthralgias.       Right side pain  Skin: Negative.   Allergic/Immunologic: Negative.   Neurological: Negative.        Off-balance  Hematological: Negative.   Psychiatric/Behavioral: Negative.        Objective:   Physical Exam  Constitutional: She is oriented to person, place, and time. She appears well-developed and well-nourished. No distress.  HENT:  Head: Normocephalic and atraumatic.  Right Ear: External ear normal.  Left Ear: External ear normal.  Mouth/Throat: Oropharynx is clear and moist. No oropharyngeal exudate.  Slight nasal congestion  Eyes: Pupils are equal, round, and reactive to light. Conjunctivae and EOM are normal. Right eye exhibits no discharge. Left eye exhibits no discharge.  Neck: Normal range of motion. Neck supple. No thyromegaly present.  No thyromegaly bruits or anterior cervical adenopathy  Cardiovascular: Normal rate, regular rhythm, normal heart sounds and intact distal pulses.  No murmur heard. Heart is regular at 72/min with pedal pulses that are present but slightly weaker on the right side.  Pulmonary/Chest: Effort normal and breath sounds normal. She has no wheezes. She has no rales.  Clear anteriorly and posteriorly  Abdominal: Soft. Bowel sounds are normal. She exhibits no mass. There is tenderness.  Slight tenderness just below the costal margin on the right side of the abdomen.  No masses organ enlargement or bruits.  Slight epigastric and slight suprapubic tenderness.  Musculoskeletal: She exhibits tenderness and deformity. She  exhibits no edema.  Patient  is slightly tender in the abdominal wall below the right costal margin.  She continues to have limited range of motion because of back pain and osteoarthritis.  She has a fairly pronounced bunion of the right foot.  Lymphadenopathy:    She has no cervical adenopathy.  Neurological: She is alert and oriented to person, place, and time. She has normal reflexes. No cranial nerve deficit.  Skin: Skin is warm and dry. No rash noted.  Psychiatric: She has a normal mood and affect. Her behavior is normal. Judgment and thought content normal.  The patient is pleasant and alert with her normal affect.  Nursing note and vitals reviewed.  BP 109/75 (BP Location: Left Arm)   Pulse 71   Temp 97.9 F (36.6 C) (Oral)   Ht 5' 2.5" (1.588 m)   Wt 128 lb (58.1 kg)   BMI 23.04 kg/m    Chest x-ray and DEXA scan with results pending==     Assessment & Plan:  1. Hyperlipidemia, unspecified hyperlipidemia type -NU with aggressive therapeutic lifestyle changes pending results of lab work - CBC  with Differential/Platelet - Lipid panel - DG Chest 2 View; Future  2. Essential hypertension -The blood pressure is good today and she will continue with current treatment - CBC with Differential/Platelet - BMP8+EGFR - Hepatic function panel - DG Chest 2 View; Future  3. Vitamin D deficiency -Continue with vitamin D replacement pending results of lab work - CBC with Differential/Platelet - VITAMIN D 25 Hydroxy (Vit-D Deficiency, Fractures) - DG [facility] DEXA; Future  4. Idiopathic peripheral neuropathy -Continue to follow-up with neurology as planned and consider seeing a movement specialist as recommended by the neurologist.  Also if pain remains a problem patient may want to go and see the neurosurgeon again that she is seen in the past and consider trying gabapentin as recommended by the neurologist. - CBC with Differential/Platelet  5. Screening for  osteoporosis -Discontinue the Evista. -Because of her trouble swallowing we have at this time would not consider a bisphosphonate. - DG [facility] DEXA; Future  6. Postmenopausal - DG [facility] DEXA; Future  7. Primary osteoarthritis involving multiple joints -Continue with Voltaren but if she takes a Voltaren always take it after breakfast and always take a ranitidine before breakfast.  May take another ranitidine if needed before dinner for any type of additional heartburn or swallowing issues.  8. Gait instability -Continue with physical therapy  9. Anxiety and depression -Consider appointment with Melinda Crutch  10. Spondylolysis of lumbar region -I am sure that this is contributing to the neuropathy that she is having in her lower extremities.  Meds ordered this encounter  Medications  . losartan (COZAAR) 50 MG tablet    Sig: Take 1 tablet (50 mg total) by mouth daily.    Dispense:  90 tablet    Refill:  3  . hydrochlorothiazide (MICROZIDE) 12.5 MG capsule    Sig: TAKE 1 CAPSULE(12.5 MG) BY MOUTH DAILY    Dispense:  90 capsule    Refill:  3  . diclofenac (VOLTAREN) 75 MG EC tablet    Sig: Take 1 tablet (75 mg total) by mouth 2 (two) times daily.    Dispense:  180 tablet    Refill:  3   Patient Instructions                       Medicare Annual Wellness Visit  [facility] and the medical providers at [facility] strive to bring you the best medical care.  In doing so we not only want to address your current medical conditions and concerns but also to detect new conditions early and prevent illness, disease and health-related problems.    Medicare offers a yearly Wellness Visit which allows our clinical staff to assess your need for preventative services including immunizations, lifestyle education, counseling to decrease risk of preventable diseases and screening for fall risk and other medical concerns.    This visit is provided free of charge (no copay) for  all Medicare recipients. The clinical pharmacists at [facility] have begun to conduct these Wellness Visits which will also include a thorough review of all your medications.    As you primary medical provider recommend that you make an appointment for your Annual Wellness Visit if you have not done so already this year.  You may set up this appointment before you leave today or you may call back ([phone]) and schedule an appointment.  Please make sure when you call that you mention that you are scheduling your Annual Wellness Visit  with the clinical pharmacist so that the appointment may be made for the proper length of time.    Continue current medications. Continue good therapeutic lifestyle changes which include good diet and exercise. Fall precautions discussed with patient. If an FOBT was given today- please return it to our front desk. If you are over 98 years old - you may need Prevnar 49 or the adult Pneumonia vaccine.  **Flu shots are available--- please call and schedule a FLU-CLINIC appointment**  After your visit with [place] today you will receive a survey in the mail or online from [employer] regarding your care with [place]. Please take a moment to fill this out. Your feedback is very important to [place] as you can help [place] better understand your patient needs as well as improve your experience and satisfaction. WE CARE ABOUT YOU!!!   The patient should continue with her physical therapy. If the pain gets worse she should call us back and we will make a referral to the neurosurgery group that Dr. Joya Salm used to be a part of. Also if she continues with the dystonia and painful movements we should follow through with the recommendation of the neurologist to the doctor who does a movement disorders, Dr. Carles Collet. She should continue with as aggressive therapeutic lifestyle changes as possible which include diet and exercise which could include nothing more than walking regularly  several times a day  Take zantac otc 150  - before breakfast and before supper if eating out and not knowing what you are going to be eating If the trouble swallowing gets worse please get back in touch with [place] Avoid caffeine and fried foods as much as possible Please let us know if you decide to go to Dr Over[facility] group and we will refer you. We will call you about the final result of your bone dexa and discuss possible meds at that point. Marya Amsler is the counselor that works with [facility] and if you decide you want to set up an appointment to see her, please call my nurse and she will arrange for this appointment You will get scheduled for a pelvic exam this fall Make sure that you get your eye exam regularly If you decide you want to try the gabapentin to help with neuropathy call us and we can try this starting at bedtime at a low dose.    Arrie Senate MD

## 2017-12-26 LAB — CBC WITH DIFFERENTIAL/PLATELET
Basophils Absolute: 0 10*3/uL (ref 0.0–0.2)
Basos: 0 %
EOS (ABSOLUTE): 0.1 10*3/uL (ref 0.0–0.4)
EOS: 1 %
HEMATOCRIT: 41.4 % (ref 34.0–46.6)
Hemoglobin: 14.2 g/dL (ref 11.1–15.9)
Immature Grans (Abs): 0.1 10*3/uL (ref 0.0–0.1)
Immature Granulocytes: 1 %
LYMPHS ABS: 1.4 10*3/uL (ref 0.7–3.1)
Lymphs: 13 %
MCH: 33 pg (ref 26.6–33.0)
MCHC: 34.3 g/dL (ref 31.5–35.7)
MCV: 96 fL (ref 79–97)
MONOS ABS: 0.9 10*3/uL (ref 0.1–0.9)
Monocytes: 8 %
Neutrophils Absolute: 8.6 10*3/uL — ABNORMAL HIGH (ref 1.4–7.0)
Neutrophils: 77 %
Platelets: 363 10*3/uL (ref 150–450)
RBC: 4.3 x10E6/uL (ref 3.77–5.28)
RDW: 12.9 % (ref 12.3–15.4)
WBC: 11 10*3/uL — AB (ref 3.4–10.8)

## 2017-12-26 LAB — LIPID PANEL
CHOL/HDL RATIO: 3.2 ratio (ref 0.0–4.4)
Cholesterol, Total: 219 mg/dL — ABNORMAL HIGH (ref 100–199)
HDL: 68 mg/dL (ref >39)
LDL Calculated: 132 mg/dL — ABNORMAL HIGH (ref 0–99)
Triglycerides: 97 mg/dL (ref 0–149)
VLDL Cholesterol Cal: 19 mg/dL (ref 5–40)

## 2017-12-26 LAB — BMP8+EGFR
BUN/Creatinine Ratio: 29 — ABNORMAL HIGH (ref 12–28)
BUN: 24 mg/dL (ref 8–27)
CO2: 23 mmol/L (ref 20–29)
Calcium: 10.1 mg/dL (ref 8.7–10.3)
Chloride: 98 mmol/L (ref 96–106)
Creatinine, Ser: 0.84 mg/dL (ref 0.57–1.00)
GFR, EST AFRICAN AMERICAN: 80 mL/min/{1.73_m2} (ref >59)
GFR, EST NON AFRICAN AMERICAN: 70 mL/min/{1.73_m2} (ref >59)
Glucose: 87 mg/dL (ref 65–99)
POTASSIUM: 4.2 mmol/L (ref 3.5–5.2)
SODIUM: 136 mmol/L (ref 134–144)

## 2017-12-26 LAB — HEPATIC FUNCTION PANEL
ALBUMIN: 4.3 g/dL (ref 3.5–4.8)
ALT: 31 IU/L (ref 0–32)
AST: 21 IU/L (ref 0–40)
Alkaline Phosphatase: 81 IU/L (ref 39–117)
BILIRUBIN TOTAL: 0.6 mg/dL (ref 0.0–1.2)
Bilirubin, Direct: 0.15 mg/dL (ref 0.00–0.40)
Total Protein: 6.6 g/dL (ref 6.0–8.5)

## 2017-12-26 LAB — VITAMIN D 25 HYDROXY (VIT D DEFICIENCY, FRACTURES): VIT D 25 HYDROXY: 35.3 ng/mL (ref 30.0–100.0)

## 2018-01-15 NOTE — Progress Notes (Signed)
Working on [employer].

## 2018-01-31 NOTE — Telephone Encounter (Signed)
Cost to patient would approximately be somewhere between $240 and $300 based on the summary of benefits. Left message for patient to return my call.

## 2018-03-27 ENCOUNTER — Ambulatory Visit (INDEPENDENT_AMBULATORY_CARE_PROVIDER_SITE_OTHER): Payer: No Typology Code available for payment source | Admitting: Licensed Clinical Social Worker

## 2018-03-27 DIAGNOSIS — F419 Anxiety disorder, unspecified: Secondary | ICD-10-CM

## 2018-04-22 ENCOUNTER — Ambulatory Visit (INDEPENDENT_AMBULATORY_CARE_PROVIDER_SITE_OTHER): Payer: No Typology Code available for payment source | Admitting: Licensed Clinical Social Worker

## 2018-04-22 DIAGNOSIS — F321 Major depressive disorder, single episode, moderate: Secondary | ICD-10-CM | POA: Diagnosis not present

## 2018-05-19 ENCOUNTER — Ambulatory Visit: Payer: No Typology Code available for payment source | Admitting: Licensed Clinical Social Worker

## 2018-05-19 ENCOUNTER — Ambulatory Visit (INDEPENDENT_AMBULATORY_CARE_PROVIDER_SITE_OTHER): Payer: Medicare Other

## 2018-05-19 DIAGNOSIS — Z23 Encounter for immunization: Secondary | ICD-10-CM

## 2018-06-17 ENCOUNTER — Other Ambulatory Visit: Payer: Self-pay

## 2018-06-17 ENCOUNTER — Encounter: Payer: Self-pay | Admitting: Neurology

## 2018-06-17 ENCOUNTER — Ambulatory Visit: Payer: Medicare Other | Admitting: Neurology

## 2018-06-17 VITALS — BP 130/76 | HR 77 | Ht 62.0 in | Wt 127.0 lb

## 2018-06-17 DIAGNOSIS — G629 Polyneuropathy, unspecified: Secondary | ICD-10-CM | POA: Diagnosis not present

## 2018-06-17 NOTE — Patient Instructions (Signed)
Good seeing you! Continue with balance therapy home exercises. If you would like to have the speech changes evaluated, please let us know and we will schedule you with our Movement Disorders specialist Dr. Carles Collet. Follow-up in 6 months, call for any changes

## 2018-06-17 NOTE — Progress Notes (Signed)
NEUROLOGY FOLLOW UP OFFICE NOTE  Lynn [name] [id number] 06/08/1946  HISTORY OF PRESENT ILLNESS: I had the pleasure of seeing Lynn [name] in follow-up in the neurology clinic on 06/17/2018.  The patient was last seen 6 months ago for gait imbalance and dizziness. Neuropathy labs were normal. She had an EMG/NCV confirming a chronic axonal sensorimotor polyneuropathy and a superimposed chronic left L4 radiculopathy. She underwent Balance therapy and has not been able to do HEP. She denies any falls but has had near misses, noticing more symptoms when she is bending over in the shower. She continues to have back and hip pain due to arthritis, and occasional "nerve twitches" in her feet, feeling cold at times. She has noticed some difficulty picking up things with her fingers, but denies any tremors. She still notices that her left foot turns in when she stretches her legs, but does not occur when walking. She is again noted to have speech changes where she needs to take deep breaths in between talking, she has noticed that there is some thickening in her voice. Her mother tells her she talks softly. She denies any headaches, vision changes, anosmia, bowel/bladder dysfunction.   HPI 05/09/2017: This is a pleasant 72 yo RH woman with a history of hypertension, who presented for evaluation of gait unsteadiness. She had been seeing Neurosurgery for back pain. She reported symptoms started a couple of years ago, but worsened in the past 6-8 months. She had fractured her left foot and was doing PT, noting difficulties with heel-toe walking, worse when she closes her eyes. She has come very close to falling, she was drying her hair bending forward and pitched forward. She has numbness and tingling in both hands and feet up to the mid-calf region. She has occasional sharp pains in both feet. Over the past couple of years, she has noticed clumsiness in both hands, worse the past year. If she does much  lifting, she starts having back pain. She denies any neck pain except when bending head forward too much. She has some stress incontinence, no bowel dysfunction. She has occasional headaches associated with eye issues. She has a little blurred vision with dry eye. Over the past 1.5 years, she has noticed a change in her speech, her voice has thickened and she is more hesitant when talking, worse this past year. She has also noticed some anxiety worsens it as well. She occasionally chokes on her food. She denies any family history of similar symptoms. She had an MRI brain without contrast at [facility] last 02/28/17, images unavailable for review, there were no acute changes. There was mild FLAIR white matter changes, normal brain volume.   PAST MEDICAL HISTORY: Past Medical History:  Diagnosis Date  . Arthritis   . Hypertension     MEDICATIONS: Current Outpatient Medications on File Prior to Visit  Medication Sig Dispense Refill  . aspirin 81 MG tablet Take 81 mg by mouth daily.    . calcium carbonate (OS-CAL) 600 MG TABS tablet Take 600 mg by mouth daily with breakfast.     . diclofenac (VOLTAREN) 75 MG EC tablet Take 1 tablet (75 mg total) by mouth 2 (two) times daily. 180 tablet 3  . hydrochlorothiazide (MICROZIDE) 12.5 MG capsule TAKE 1 CAPSULE(12.5 MG) BY MOUTH DAILY 90 capsule 3  . losartan (COZAAR) 50 MG tablet Take 1 tablet (50 mg total) by mouth daily. 90 tablet 3  . raloxifene (EVISTA) 60 MG tablet Take 60 mg by mouth  daily.    . Vitamin D, Ergocalciferol, (DRISDOL) 50000 units CAPS capsule Take 1 capsule (50,000 Units total) by mouth every 7 (seven) days. 12 capsule 3   No current facility-administered medications on file prior to visit.     ALLERGIES: No Known Allergies  FAMILY HISTORY: Family History  Problem Relation Age of Onset  . Colon cancer Father   . Esophageal cancer Neg Hx   . Rectal cancer Neg Hx   . Stomach cancer Neg Hx     SOCIAL HISTORY: Social  History   Socioeconomic History  . Marital status: Divorced    Spouse name: Not on file  . Number of children: 1  . Years of education: 60  . Highest education level: Not on file  Occupational History  . Not on file  Social Needs  . Financial resource strain: Not on file  . Food insecurity:    Worry: Not on file    Inability: Not on file  . Transportation needs:    Medical: Not on file    Non-medical: Not on file  Tobacco Use  . Smoking status: Former Smoker    Last attempt to quit: 12/21/1975    Years since quitting: 42.5  . Smokeless tobacco: Never Used  Substance and Sexual Activity  . Alcohol use: Yes    Comment: occ  . Drug use: No  . Sexual activity: Not on file  Lifestyle  . Physical activity:    Days per week: Not on file    Minutes per session: Not on file  . Stress: Not on file  Relationships  . Social connections:    Talks on phone: Not on file    Gets together: Not on file    Attends religious service: Not on file    Active member of club or organization: Not on file    Attends meetings of clubs or organizations: Not on file    Relationship status: Not on file  . Intimate partner violence:    Fear of current or ex partner: Not on file    Emotionally abused: Not on file    Physically abused: Not on file    Forced sexual activity: Not on file  Other Topics Concern  . Not on file  Social History Narrative   Patient lives in a 2 story home.  Her mother lives with her.  Children: 1 son.  Retired from [employer].  Education: college.     REVIEW OF SYSTEMS: Constitutional: No fevers, chills, or sweats, no generalized fatigue, change in appetite Eyes: No visual changes, double vision, eye pain Ear, nose and throat: No hearing loss, ear pain, nasal congestion, sore throat Cardiovascular: No chest pain, palpitations Respiratory:  No shortness of breath at rest or with exertion, wheezes GastrointestinaI: No nausea, vomiting, diarrhea, abdominal pain, fecal  incontinence Genitourinary:  No dysuria, urinary retention or frequency Musculoskeletal:  + neck pain, back pain Integumentary: No rash, pruritus, skin lesions Neurological: as above Psychiatric: No depression, insomnia, anxiety Endocrine: No palpitations, fatigue, diaphoresis, mood swings, change in appetite, change in weight, increased thirst Hematologic/Lymphatic:  No anemia, purpura, petechiae. Allergic/Immunologic: no itchy/runny eyes, nasal congestion, recent allergic reactions, rashes  PHYSICAL EXAM: Vitals:   06/17/18 1534  BP: 130/76  Pulse: 77  SpO2: 95%   General: No acute distress, she is noted again to have hesitant speech, similar to prior (?spasmodic dysphonia) Head:  Normocephalic/atraumatic Neck: supple, no paraspinal tenderness, full range of motion Heart:  Regular rate and rhythm Lungs:  Clear to auscultation bilaterally Back: No paraspinal tenderness Skin/Extremities: No rash, no edema Neurological Exam: alert and oriented to person, place, and time. No aphasia or dysarthria. Fund of knowledge is appropriate.  Recent and remote memory are intact.  Attention and concentration are normal.    Able to name objects and repeat phrases. Cranial nerves: Pupils equal, round, reactive to light.  Extraocular movements intact with no nystagmus. Visual fields full. Facial sensation intact. No facial asymmetry. Tongue, uvula, palate midline.  Motor: Bulk and tone normal, no cogwheeling, no fasciculations today, muscle strength 5/5 throughout with no pronator drift.  Sensation intact to all modalities on both UE, decreased on both LE to cold, vibration to ankles bilaterally, tingling to pin on both feet. +Romberg test. Deep tendon reflexes +1 on both UE, unable to elicit on both LE. Toes downgoing.  Finger to nose testing intact. Able to rise with arms crossed over chest.  Gait narrow-based and steady with good arm swing, mild difficulty with tandem walk. Negative pull test. Good finger  and foot taps.  IMPRESSION: This is a pleasant 72 yo RH woman with a history of hypertension, who presented for gait unsteadiness. She has also noticed a change in speech pattern over the past year. Neurological exam shows evidence of a length-dependent neuropathy in both hands and legs, no parkinsonian signs. MRI brain unremarkable. EMG/NCV confirmed neuropathy, neuropathy labs normal. She denies any falls and will try to continue Balance therapy HEP. We again discussed speech changes (?spasmodic dysphonia), she has also noticed her left foot occasionally turns in when she stretches it (not when ambulating). We discussed obtaining a second opinion from our Movement Disorders specialist Dr. Carles Collet, she will let us know when she would like to proceed. She will follow-up in 6 months and knows to call for any changes.  Thank you for allowing me to participate in her care.  Please do not hesitate to call for any questions or concerns.  The duration of this appointment visit was 25=7 minutes of face-to-face time with the patient.  Greater than 50% of this time was spent in counseling, explanation of diagnosis, planning of further management, and coordination of care.   Ellouise Newer, M.D.   CC: Dr. Laurance Flatten

## 2018-06-30 ENCOUNTER — Telehealth: Payer: Self-pay | Admitting: Family Medicine

## 2018-07-02 ENCOUNTER — Ambulatory Visit: Payer: Medicare Other | Admitting: Family Medicine

## 2018-08-05 ENCOUNTER — Encounter: Payer: Self-pay | Admitting: Family Medicine

## 2018-08-05 ENCOUNTER — Ambulatory Visit (INDEPENDENT_AMBULATORY_CARE_PROVIDER_SITE_OTHER): Payer: Medicare Other | Admitting: Family Medicine

## 2018-08-05 VITALS — BP 137/82 | HR 72 | Temp 96.9°F | Ht 62.0 in | Wt 129.0 lb

## 2018-08-05 DIAGNOSIS — E559 Vitamin D deficiency, unspecified: Secondary | ICD-10-CM | POA: Diagnosis not present

## 2018-08-05 DIAGNOSIS — M150 Primary generalized (osteo)arthritis: Secondary | ICD-10-CM

## 2018-08-05 DIAGNOSIS — I10 Essential (primary) hypertension: Secondary | ICD-10-CM | POA: Diagnosis not present

## 2018-08-05 DIAGNOSIS — R1310 Dysphagia, unspecified: Secondary | ICD-10-CM

## 2018-08-05 DIAGNOSIS — M4306 Spondylolysis, lumbar region: Secondary | ICD-10-CM

## 2018-08-05 DIAGNOSIS — E785 Hyperlipidemia, unspecified: Secondary | ICD-10-CM

## 2018-08-05 DIAGNOSIS — G609 Hereditary and idiopathic neuropathy, unspecified: Secondary | ICD-10-CM

## 2018-08-05 DIAGNOSIS — R10826 Epigastric rebound abdominal tenderness: Secondary | ICD-10-CM | POA: Diagnosis not present

## 2018-08-05 DIAGNOSIS — M159 Polyosteoarthritis, unspecified: Secondary | ICD-10-CM

## 2018-08-05 NOTE — Patient Instructions (Addendum)
Medicare Annual Wellness Visit  [place] and the medical providers at [facility] strive to bring you the best medical care.  In doing so we not only want to address your current medical conditions and concerns but also to detect new conditions early and prevent illness, disease and health-related problems.    Medicare offers a yearly Wellness Visit which allows our clinical staff to assess your need for preventative services including immunizations, lifestyle education, counseling to decrease risk of preventable diseases and screening for fall risk and other medical concerns.    This visit is provided free of charge (no copay) for all Medicare recipients. The clinical pharmacists at [facility] have begun to conduct these Wellness Visits which will also include a thorough review of all your medications.    As you primary medical provider recommend that you make an appointment for your Annual Wellness Visit if you have not done so already this year.  You may set up this appointment before you leave today or you may call back ([phone]) and schedule an appointment.  Please make sure when you call that you mention that you are scheduling your Annual Wellness Visit with the clinical pharmacist so that the appointment may be made for the proper length of time.      Continue current medications. Continue good therapeutic lifestyle changes which include good diet and exercise. Fall precautions discussed with patient. If an FOBT was given today- please return it to our front desk. If you are over 85 years old - you may need Prevnar 47 or the adult Pneumonia vaccine.  **Flu shots are available--- please call and schedule a FLU-CLINIC appointment**  After your visit with [place] today you will receive a survey in the mail or online from [employer] regarding your care with [place]. Please take a moment to fill this out. Your feedback is very  important to [place] as you can help [place] better understand your patient needs as well as improve your experience and satisfaction. WE CARE ABOUT YOU!!!   Pepcid AC 1 twice daily before breakfast and supper Try to reduce the diclofenac if possible as this may be irritating your stomach and affecting your swallowing We will arrange for you to see the gastroenterologist because of the swallowing issues that you are describing Please consider trying some gabapentin which may help some of the neuropathy in your legs.  If you are willing to try this please call us back and we will call a prescription in for this.

## 2018-08-05 NOTE — Progress Notes (Signed)
Subjective:    Patient ID: Lynn [name], female    DOB: [birth year], 73 y.o.   MRN: [id number]  HPI Pt here for follow up and management of chronic medical problems which includes hypertension and hyperlipidemia. She is taking medication regularly.  Patient comes in for her regular 6-monthcheckup today.  She has no complaints and does not need any refills.  Recheck of the blood pressure was 137/82 after the initial reading of 147/82.  She has been out of losartan for a couple weeks.  She will be given an FOBT to return and will get lab work today.  The patient comes in for her regular visit and I am not sure why she stopped taking the losartan but I apologize if it was a problem from our in.  She can stays as active as possible but does have pain in her back and legs and especially in both hips when she walks very far.  She denies any chest pain or shortness of breath anymore than usual.  After questioning her in more detail it appears that she is having some trouble with swallowing at times like the food does not want to go down she has to drink more water to make the food go down.  We did discuss the fact that she is taking Voltaren 1 daily and that this could irritate her stomach.  After talking with her further we will go ahead and put her on a Pepcid AC and schedule her a visit with a gastroenterologist to further evaluate her swallowing issues.  She denies any blood in the stool or black tarry bowel movements.  She does have some urinary incontinence.  She will get back on her blood pressure medicine.   Patient Active Problem List   Diagnosis Date Noted  . Aortic atherosclerosis ([place] 06/26/[place]  . Neuropathy 05/20/2017  . Abnormality of gait due to impairment of balance 05/20/2017  . Incontinence in female 06/25/2014  . Depression 06/25/2014  . Hearing deficit 06/25/2014  . Osteoporosis 06/24/2013  . Hypertension 05/11/2013  . Osteoarthritis 05/11/2013  . Vitamin D deficiency  05/11/2013  . Personal history of colonic polyps 05/11/2013  . Hyperlipidemia 05/11/2013   Outpatient Encounter Medications as of 08/05/2018  Medication Sig  . aspirin 81 MG tablet Take 81 mg by mouth daily.  . calcium carbonate (OS-CAL) 600 MG TABS tablet Take 600 mg by mouth daily with breakfast.   . diclofenac (VOLTAREN) 75 MG EC tablet Take 1 tablet (75 mg total) by mouth 2 (two) times daily.  . hydrochlorothiazide (MICROZIDE) 12.5 MG capsule TAKE 1 CAPSULE(12.5 MG) BY MOUTH DAILY  . losartan (COZAAR) 50 MG tablet Take 1 tablet (50 mg total) by mouth daily.  . Vitamin D, Ergocalciferol, (DRISDOL) 50000 units CAPS capsule Take 1 capsule (50,000 Units total) by mouth every 7 (seven) days.  . [DISCONTINUED] raloxifene (EVISTA) 60 MG tablet Take 60 mg by mouth daily.   No facility-administered encounter medications on file as of 08/05/2018.       Review of Systems  Constitutional: Negative.   HENT: Negative.   Eyes: Negative.   Respiratory: Negative.   Cardiovascular: Negative.   Gastrointestinal: Negative.   Endocrine: Negative.   Genitourinary: Negative.   Musculoskeletal: Negative.   Skin: Negative.   Allergic/Immunologic: Negative.   Neurological: Negative.   Hematological: Negative.   Psychiatric/Behavioral: Negative.        Objective:   Physical Exam Vitals signs and nursing note reviewed.  Constitutional:  Appearance: Normal appearance. She is well-developed and normal weight. She is not ill-appearing.  HENT:     Head: Normocephalic and atraumatic.     Right Ear: Tympanic membrane, ear canal and external ear normal. There is no impacted cerumen.     Left Ear: Tympanic membrane, ear canal and external ear normal. There is no impacted cerumen.     Nose: Nose normal. No congestion.     Mouth/Throat:     Mouth: Mucous membranes are moist.     Pharynx: Oropharynx is clear.  Eyes:     General: No scleral icterus.       Right eye: No discharge.        Left eye:  No discharge.     Conjunctiva/sclera: Conjunctivae normal.     Pupils: Pupils are equal, round, and reactive to light.  Neck:     Musculoskeletal: Normal range of motion and neck supple. No neck rigidity.     Thyroid: No thyromegaly.     Vascular: No carotid bruit or JVD.  Cardiovascular:     Rate and Rhythm: Normal rate and regular rhythm.     Heart sounds: Normal heart sounds. No murmur.     Comments: The heart is regular at 72/min with no edema Pulmonary:     Effort: Pulmonary effort is normal. No respiratory distress.     Breath sounds: Normal breath sounds. No wheezing or rales.     Comments: Clear anteriorly and posteriorly Abdominal:     General: Abdomen is flat. Bowel sounds are normal.     Palpations: Abdomen is soft. There is no mass.     Tenderness: There is abdominal tenderness. There is no guarding.     Comments: Patient has tenderness in the epigastric area without liver or spleen enlargement masses or bruits and no inguinal adenopathy.  Musculoskeletal: Normal range of motion.        General: No swelling, tenderness or deformity.     Right lower leg: No edema.     Left lower leg: No edema.     Comments: Range of motion appeared to be pretty good during the office visit today.  She does complain of neuropathy with walking and has a history of spinal stenosis and spondylosis of the spine.  Lymphadenopathy:     Cervical: No cervical adenopathy.  Skin:    General: Skin is warm and dry.     Findings: No rash.  Neurological:     General: No focal deficit present.     Mental Status: She is alert and oriented to person, place, and time. Mental status is at baseline.     Cranial Nerves: No cranial nerve deficit.     Sensory: No sensory deficit.     Motor: No weakness.     Gait: Gait normal.     Deep Tendon Reflexes: Reflexes abnormal.     Comments: Somewhat questionable today when asked specific questions.  She did respond appropriately to the other questions asked of her.   Psychiatric:        Mood and Affect: Mood normal.        Behavior: Behavior normal.        Thought Content: Thought content normal.        Judgment: Judgment normal.     Comments: Mood affect and behavior were normal for this patient.  She does not want to admit to making steps forward to get something done about her health issues.  She is also left off  her blood pressure medicine for no  reason for 2 weeks.  Referrals for her in the past have been good in the visit but not follow through with anything that is recommended.     BP (!) 147/82 (BP Location: Left Arm)   Pulse 72   Temp (!) 96.9 F (36.1 C) (Oral)   Ht 5' 2"  (1.575 m)   Wt 129 lb (58.5 kg)   BMI 23.59 kg/m    Repeat blood pressure 137/82.    Assessment & Plan:  1. Essential hypertension -Get started back on losartan and take this regularly - BMP8+EGFR - CBC with Differential/Platelet - Hepatic function panel  2. Hyperlipidemia, unspecified hyperlipidemia type -Continue aggressive therapeutic lifestyle changes - CBC with Differential/Platelet - Lipid panel  3. Vitamin D deficiency -Continue with vitamin D replacement pending results of lab work - CBC with Differential/Platelet - VITAMIN D 25 Hydroxy (Vit-D Deficiency, Fractures)  4. Epigastric abdominal tenderness with rebound tenderness -Hold the diclofenac and take Pepcid AC 1 twice daily before breakfast and supper - Ambulatory referral to Gastroenterology  5. Dysphagia, unspecified type -Hold the diclofenac and take Pepcid AC 1 twice daily before breakfast and supper - Ambulatory referral to Gastroenterology  6.  Peripheral neuropathy -Consider trying gabapentin and she will let us know if she wants to try this or not.  7.  Primary osteoarthritis involving multiple joints  8.  Spondylosis of the lumbar spine  Patient Instructions                       Medicare Annual Wellness Visit  [facility] and the medical providers at [facility] strive to bring you the best medical care.  In doing so we not only want to address your current medical conditions and concerns but also to detect new conditions early and prevent illness, disease and health-related problems.    Medicare offers a yearly Wellness Visit which allows our clinical staff to assess your need for preventative services including immunizations, lifestyle education, counseling to decrease risk of preventable diseases and screening for fall risk and other medical concerns.    This visit is provided free of charge (no copay) for all Medicare recipients. The clinical pharmacists at [facility] have begun to conduct these Wellness Visits which will also include a thorough review of all your medications.    As you primary medical provider recommend that you make an appointment for your Annual Wellness Visit if you have not done so already this year.  You may set up this appointment before you leave today or you may call back ([phone]) and schedule an appointment.  Please make sure when you call that you mention that you are scheduling your Annual Wellness Visit with the clinical pharmacist so that the appointment may be made for the proper length of time.      Continue current medications. Continue good therapeutic lifestyle changes which include good diet and exercise. Fall precautions discussed with patient. If an FOBT was given today- please return it to our front desk. If you are over 57 years old - you may need Prevnar 30 or the adult Pneumonia vaccine.  **Flu shots are available--- please call and schedule a FLU-CLINIC appointment**  After your visit with [place] today you will receive a survey in the mail or online from [employer] regarding your care with [place]. Please take a moment to fill this out. Your feedback is very important to [place]  as you can help [place] better understand your patient needs as well as improve your experience and  satisfaction. WE CARE ABOUT YOU!!!   Pepcid AC 1 twice daily before breakfast and supper Try to reduce the diclofenac if possible as this may be irritating your stomach and affecting your swallowing We will arrange for you to see the gastroenterologist because of the swallowing issues that you are describing Please consider trying some gabapentin which may help some of the neuropathy in your legs.  If you are willing to try this please call us back and we will call a prescription in for this.  Arrie Senate MD

## 2018-08-06 ENCOUNTER — Encounter: Payer: Self-pay | Admitting: Internal Medicine

## 2018-08-06 LAB — CBC WITH DIFFERENTIAL/PLATELET
BASOS: 0 %
Basophils Absolute: 0 10*3/uL (ref 0.0–0.2)
EOS (ABSOLUTE): 0.1 10*3/uL (ref 0.0–0.4)
Eos: 1 %
Hematocrit: 42.4 % (ref 34.0–46.6)
Hemoglobin: 13.9 g/dL (ref 11.1–15.9)
IMMATURE GRANS (ABS): 0.1 10*3/uL (ref 0.0–0.1)
Immature Granulocytes: 1 %
LYMPHS ABS: 1 10*3/uL (ref 0.7–3.1)
LYMPHS: 10 %
MCH: 32 pg (ref 26.6–33.0)
MCHC: 32.8 g/dL (ref 31.5–35.7)
MCV: 98 fL — AB (ref 79–97)
MONOS ABS: 0.7 10*3/uL (ref 0.1–0.9)
Monocytes: 7 %
NEUTROS ABS: 8.3 10*3/uL — AB (ref 1.4–7.0)
Neutrophils: 81 %
Platelets: 327 10*3/uL (ref 150–450)
RBC: 4.35 x10E6/uL (ref 3.77–5.28)
RDW: 11.9 % (ref 11.7–15.4)
WBC: 10.2 10*3/uL (ref 3.4–10.8)

## 2018-08-06 LAB — BMP8+EGFR
BUN/Creatinine Ratio: 24 (ref 12–28)
BUN: 19 mg/dL (ref 8–27)
CO2: 23 mmol/L (ref 20–29)
Calcium: 9.8 mg/dL (ref 8.7–10.3)
Chloride: 103 mmol/L (ref 96–106)
Creatinine, Ser: 0.79 mg/dL (ref 0.57–1.00)
GFR calc Af Amer: 86 mL/min/{1.73_m2} (ref >59)
GFR, EST NON AFRICAN AMERICAN: 74 mL/min/{1.73_m2} (ref >59)
GLUCOSE: 85 mg/dL (ref 65–99)
POTASSIUM: 4.5 mmol/L (ref 3.5–5.2)
SODIUM: 141 mmol/L (ref 134–144)

## 2018-08-06 LAB — HEPATIC FUNCTION PANEL
ALBUMIN: 4.1 g/dL (ref 3.5–4.8)
ALT: 32 IU/L (ref 0–32)
AST: 17 IU/L (ref 0–40)
Alkaline Phosphatase: 83 IU/L (ref 39–117)
BILIRUBIN, DIRECT: 0.1 mg/dL (ref 0.00–0.40)
Bilirubin Total: 0.4 mg/dL (ref 0.0–1.2)
TOTAL PROTEIN: 6.2 g/dL (ref 6.0–8.5)

## 2018-08-06 LAB — LIPID PANEL
Chol/HDL Ratio: 3.2 ratio (ref 0.0–4.4)
Cholesterol, Total: 208 mg/dL — ABNORMAL HIGH (ref 100–199)
HDL: 65 mg/dL (ref >39)
LDL Calculated: 122 mg/dL — ABNORMAL HIGH (ref 0–99)
Triglycerides: 106 mg/dL (ref 0–149)
VLDL CHOLESTEROL CAL: 21 mg/dL (ref 5–40)

## 2018-08-06 LAB — VITAMIN D 25 HYDROXY (VIT D DEFICIENCY, FRACTURES): VIT D 25 HYDROXY: 35.2 ng/mL (ref 30.0–100.0)

## 2018-09-08 ENCOUNTER — Encounter: Payer: Self-pay | Admitting: *Deleted

## 2018-09-19 ENCOUNTER — Encounter: Payer: Self-pay | Admitting: Internal Medicine

## 2018-09-19 ENCOUNTER — Ambulatory Visit: Payer: Medicare Other | Admitting: Internal Medicine

## 2018-09-19 VITALS — BP 148/82 | HR 76 | Ht 62.0 in | Wt 130.2 lb

## 2018-09-19 DIAGNOSIS — R0989 Other specified symptoms and signs involving the circulatory and respiratory systems: Secondary | ICD-10-CM | POA: Diagnosis not present

## 2018-09-19 DIAGNOSIS — R195 Other fecal abnormalities: Secondary | ICD-10-CM

## 2018-09-19 DIAGNOSIS — Z800 Family history of malignant neoplasm of digestive organs: Secondary | ICD-10-CM | POA: Diagnosis not present

## 2018-09-19 DIAGNOSIS — R1013 Epigastric pain: Secondary | ICD-10-CM

## 2018-09-19 DIAGNOSIS — R1310 Dysphagia, unspecified: Secondary | ICD-10-CM | POA: Diagnosis not present

## 2018-09-19 MED ORDER — SUPREP BOWEL PREP KIT 17.5-3.13-1.6 GM/177ML PO SOLN: 1.0000 | ORAL | 0 refills | Status: DC

## 2018-09-19 MED ORDER — PANTOPRAZOLE SODIUM 40 MG PO TBEC: 40.0000 mg | DELAYED_RELEASE_TABLET | Freq: Every day | ORAL | 1 refills | Status: DC

## 2018-09-19 NOTE — Patient Instructions (Signed)
You have been scheduled for an endoscopy and colonoscopy. Please follow the written instructions given to you at your visit today. Please pick up your prep supplies at the pharmacy within the next 1-3 days. If you use inhalers (even only as needed), please bring them with you on the day of your procedure. Your physician has requested that you go to www.startemmi.com and enter the access code given to you at your visit today. This web site gives a general overview about your procedure. However, you should still follow specific instructions given to you by our office regarding your preparation for the procedure.  Please purchase the following medications over the counter and take as directed: Benefiber 1 tablespoon daily  If you are age 62 or older, your body mass index should be between 23-30. Your Body mass index is 23.82 kg/m. If this is out of the aforementioned range listed, please consider follow up with your Primary Care Provider.  If you are age 63 or younger, your body mass index should be between 19-25. Your Body mass index is 23.82 kg/m. If this is out of the aformentioned range listed, please consider follow up with your Primary Care Provider.

## 2018-09-19 NOTE — Progress Notes (Signed)
Patient ID: Lynn [name], female   DOB: 07/17/1946, 73 y.o.   MRN: [id number] HPI: Batoul Limes is a 73 year old female with a history of hypertension, arthritis, neuropathy, osteoporosis, vitamin D deficiency, family history of colon cancer in her father who is seen in consult at the request of Dr. Laurance Flatten to evaluate globus sensation dysphagia and epigastric pain.  She is here alone today.  She is known to me from a screening colonoscopy which was performed on 11/09/2013.  This showed a 4 mm rectal polyp removed and found to be hyperplastic.  Sigmoid diverticulosis.  The exam was otherwise normal.  She reports that she has developed "voice thickening" which includes throat clearing and globus sensation.  She reports feeling solid food sticking in her mid chest on a fairly regular basis having to force food down with water.  Also on occasion she will feel epigastric or right upper quadrant discomfort which is worse after eating.  Dr. Laurance Flatten had recommended Pepcid but she did not try this.  She is noticed a subtle change in bowel habit over the last several years.  She is having several days of small "pebble-like" stools and then every 3 or 4 days having a larger more complete bowel movement.  Denies blood in her stool and melena.  Denies lower abdominal pain.  Does not feel true heartburn.  Past Medical History:  Diagnosis Date  . Aortic atherosclerosis ([place])   . Arthritis   . Depression   . Hyperplastic colon polyp   . Hypertension   . Neuropathy   . Osteoarthritis   . Osteoporosis   . Vitamin D deficiency     Past Surgical History:  Procedure Laterality Date  . COLONOSCOPY    . FACIAL COSMETIC SURGERY    . FINGER TENDON REPAIR     middle rt hand-  . HAMMER TOE SURGERY  12/27/2011   Procedure: HAMMER TOE CORRECTION;  Surgeon: Wylene Simmer, MD;  Location: [facility];  Service: Orthopedics;  Laterality: Left;  2-4th    Outpatient Medications Prior to Visit   Medication Sig Dispense Refill  . aspirin 81 MG tablet Take 81 mg by mouth daily.    . calcium carbonate (OS-CAL) 600 MG TABS tablet Take 600 mg by mouth daily with breakfast.     . diclofenac (VOLTAREN) 75 MG EC tablet Take 1 tablet (75 mg total) by mouth 2 (two) times daily. 180 tablet 3  . hydrochlorothiazide (MICROZIDE) 12.5 MG capsule TAKE 1 CAPSULE(12.5 MG) BY MOUTH DAILY 90 capsule 3  . losartan (COZAAR) 50 MG tablet Take 1 tablet (50 mg total) by mouth daily. 90 tablet 3  . Vitamin D, Ergocalciferol, (DRISDOL) 50000 units CAPS capsule Take 1 capsule (50,000 Units total) by mouth every 7 (seven) days. 12 capsule 3   No facility-administered medications prior to visit.     No Known Allergies  Family History  Problem Relation Age of Onset  . Colon cancer Father   . Throat cancer Brother   . Esophageal cancer Neg Hx   . Rectal cancer Neg Hx   . Stomach cancer Neg Hx     Social History   Tobacco Use  . Smoking status: Former Smoker    Last attempt to quit: 12/21/1975    Years since quitting: 42.7  . Smokeless tobacco: Never Used  Substance Use Topics  . Alcohol use: Yes    Comment: occ  . Drug use: No    ROS: As per history of present  illness, otherwise negative  BP (!) 148/82   Pulse 76   Ht 5\' 2"  (1.575 m)   Wt 130 lb 4 oz (59.1 kg)   BMI 23.82 kg/m  Constitutional: Well-developed and well-nourished. No distress. HEENT: Normocephalic and atraumatic. Oropharynx is clear and moist. Conjunctivae are normal.  No scleral icterus. Neck: Neck supple. Trachea midline. Cardiovascular: Normal rate, regular rhythm and intact distal pulses. No M/R/G Pulmonary/chest: Effort normal and breath sounds normal. No wheezing, rales or rhonchi. Abdominal: Soft, nontender, nondistended. Bowel sounds active throughout.  Extremities: no clubbing, cyanosis, or edema Neurological: Alert and oriented to person place and time. Skin: Skin is warm and dry.  Psychiatric: Normal mood and  affect. Behavior is normal.  RELEVANT LABS AND IMAGING: CBC    Component Value Date/Time   WBC 10.2 08/05/2018 1657   WBC 9.6 11/03/2014 0949   RBC 4.35 08/05/2018 1657   RBC 4.71 11/03/2014 0949   HGB 13.9 08/05/2018 1657   HCT 42.4 08/05/2018 1657   PLT 327 08/05/2018 1657   MCV 98 (H) 08/05/2018 1657   MCH 32.0 08/05/2018 1657   MCH 30.4 11/03/2014 0949   MCHC 32.8 08/05/2018 1657   MCHC 31.7 (A) 11/03/2014 0949   RDW 11.9 08/05/2018 1657   LYMPHSABS 1.0 08/05/2018 1657   EOSABS 0.1 08/05/2018 1657   BASOSABS 0.0 08/05/2018 1657    CMP     Component Value Date/Time   NA 141 08/05/2018 1657   K 4.5 08/05/2018 1657   CL 103 08/05/2018 1657   CO2 23 08/05/2018 1657   GLUCOSE 85 08/05/2018 1657   GLUCOSE 87 04/24/2013 0953   BUN 19 08/05/2018 1657   CREATININE 0.79 08/05/2018 1657   CREATININE 0.91 04/24/2013 0953   CALCIUM 9.8 08/05/2018 1657   PROT 6.2 08/05/2018 1657   ALBUMIN 4.1 08/05/2018 1657   AST 17 08/05/2018 1657   ALT 32 08/05/2018 1657   ALKPHOS 83 08/05/2018 1657   BILITOT 0.4 08/05/2018 1657   GFRNONAA 74 08/05/2018 1657   GFRNONAA 65 04/24/2013 0953   GFRAA 86 08/05/2018 1657   GFRAA 76 04/24/2013 0953    ASSESSMENT/PLAN: 73 year old female with a history of hypertension, arthritis, neuropathy, osteoporosis, vitamin D deficiency, family history of colon cancer in her father who is seen in consult at the request of Dr. Laurance Flatten to evaluate globus sensation dysphagia and epigastric pain.   1.  Globus sensation/hoarseness with throat clearing/dysphagia --symptoms are most consistent with GERD quite possibly with esophagitis.  I recommended we perform upper endoscopy to rule out stricture and much less likely mass.  We discussed the risk, benefits and alternatives and she is agreeable and wishes to proceed.  I would like to begin her on pantoprazole 40 mg daily, 30 minutes before breakfast.  I explained that this is an acid reducing medication and very well  may improve the majority of the symptoms.  We discussed the possible side effects.  2.  Family history of colon cancer --screening colonoscopy due this year as well.  We can perform both upper and lower endoscopy on the same day.  We discussed the risk, benefits and alternatives to repeat colonoscopy and she is agreeable and wishes to proceed.  Both of these studies were scheduled today  3.  Change in bowel habits/mild constipation --I recommended she begin Benefiber 1 heaping tablespoon daily to improve bowel movement and hopefully prevent hard and small stool.   VW:UJWJX, Estella Husk, [place], [place] [id number]

## 2018-09-22 ENCOUNTER — Telehealth: Payer: Self-pay | Admitting: Internal Medicine

## 2018-09-22 NOTE — Telephone Encounter (Signed)
I have spoken to patient to advise that Suprep sample has been placed at the front desk for her to pick up. She verbalizes understanding.

## 2018-09-22 NOTE — Telephone Encounter (Signed)
Pt called wanting to speak with some one about maybe an alternative suprep that is covered by insurance

## 2018-09-24 ENCOUNTER — Encounter: Payer: Self-pay | Admitting: Internal Medicine

## 2018-09-24 NOTE — Telephone Encounter (Signed)
Error

## 2018-10-24 ENCOUNTER — Encounter: Payer: Self-pay | Admitting: Internal Medicine

## 2018-12-05 ENCOUNTER — Telehealth: Payer: Self-pay | Admitting: Family Medicine

## 2018-12-31 ENCOUNTER — Telehealth: Payer: Self-pay | Admitting: Family Medicine

## 2018-12-31 NOTE — Telephone Encounter (Signed)
Please contact patient

## 2018-12-31 NOTE — Telephone Encounter (Signed)
Called pt.

## 2019-01-07 ENCOUNTER — Telehealth: Payer: Self-pay | Admitting: Family Medicine

## 2019-01-07 NOTE — Telephone Encounter (Signed)
Pt called

## 2019-01-07 NOTE — Telephone Encounter (Signed)
PT states she is wanting to talk to Roselyn Reef about something she was doing for her.

## 2019-01-13 ENCOUNTER — Telehealth: Payer: Self-pay | Admitting: Family Medicine

## 2019-01-14 ENCOUNTER — Ambulatory Visit: Payer: Self-pay | Admitting: Neurology

## 2019-01-15 NOTE — Telephone Encounter (Signed)
Pt called

## 2019-01-19 ENCOUNTER — Encounter: Payer: Medicare Other | Admitting: Internal Medicine

## 2019-01-28 ENCOUNTER — Other Ambulatory Visit: Payer: Self-pay

## 2019-02-03 ENCOUNTER — Ambulatory Visit: Payer: Medicare Other | Admitting: Family Medicine

## 2019-02-09 ENCOUNTER — Encounter: Payer: Medicare Other | Admitting: Internal Medicine

## 2019-02-13 ENCOUNTER — Ambulatory Visit: Payer: Medicare Other | Admitting: Neurology

## 2019-02-26 ENCOUNTER — Ambulatory Visit (AMBULATORY_SURGERY_CENTER): Payer: Self-pay

## 2019-02-26 ENCOUNTER — Other Ambulatory Visit: Payer: Self-pay

## 2019-02-26 VITALS — Temp 97.5°F | Ht 62.0 in | Wt 130.0 lb

## 2019-02-26 DIAGNOSIS — R1310 Dysphagia, unspecified: Secondary | ICD-10-CM

## 2019-02-26 DIAGNOSIS — Z800 Family history of malignant neoplasm of digestive organs: Secondary | ICD-10-CM

## 2019-02-26 DIAGNOSIS — R1319 Other dysphagia: Secondary | ICD-10-CM

## 2019-02-26 NOTE — Progress Notes (Signed)
Per pt, no allergies to soy or egg products.Pt not taking any weight loss meds or using  O2 at home.  Pt states she is a little slow to wake up past sedation.  Pt refused emmi video.   Spent over 55 minutes reviewing medical hx and prep instructions with the pt. She was very anxious during the PV , but will call back if she has any questions prior to her procedures.

## 2019-03-04 ENCOUNTER — Encounter: Payer: Self-pay | Admitting: Internal Medicine

## 2019-03-12 ENCOUNTER — Other Ambulatory Visit: Payer: Self-pay

## 2019-03-12 ENCOUNTER — Ambulatory Visit (AMBULATORY_SURGERY_CENTER): Payer: Medicare Other | Admitting: Internal Medicine

## 2019-03-12 ENCOUNTER — Encounter: Payer: Self-pay | Admitting: Internal Medicine

## 2019-03-12 VITALS — BP 117/74 | HR 59 | Temp 98.6°F | Resp 16 | Ht 62.0 in | Wt 130.0 lb

## 2019-03-12 DIAGNOSIS — K449 Diaphragmatic hernia without obstruction or gangrene: Secondary | ICD-10-CM | POA: Diagnosis not present

## 2019-03-12 DIAGNOSIS — K222 Esophageal obstruction: Secondary | ICD-10-CM | POA: Diagnosis not present

## 2019-03-12 DIAGNOSIS — Z800 Family history of malignant neoplasm of digestive organs: Secondary | ICD-10-CM | POA: Diagnosis present

## 2019-03-12 DIAGNOSIS — K210 Gastro-esophageal reflux disease with esophagitis: Secondary | ICD-10-CM | POA: Diagnosis not present

## 2019-03-12 DIAGNOSIS — K2980 Duodenitis without bleeding: Secondary | ICD-10-CM

## 2019-03-12 DIAGNOSIS — Z1211 Encounter for screening for malignant neoplasm of colon: Secondary | ICD-10-CM | POA: Diagnosis not present

## 2019-03-12 DIAGNOSIS — K219 Gastro-esophageal reflux disease without esophagitis: Secondary | ICD-10-CM

## 2019-03-12 DIAGNOSIS — R1310 Dysphagia, unspecified: Secondary | ICD-10-CM

## 2019-03-12 MED ORDER — SODIUM CHLORIDE 0.9 % IV SOLN: 500.0000 mL | Freq: Once | INTRAVENOUS | Status: DC

## 2019-03-12 MED ORDER — PANTOPRAZOLE SODIUM 40 MG PO TBEC: 40.0000 mg | DELAYED_RELEASE_TABLET | Freq: Every day | ORAL | 1 refills | Status: DC

## 2019-03-12 NOTE — Op Note (Signed)
[facility] Patient Name: Lynn [name] Procedure Date: 03/12/2019 2:38 PM MRN: [id number] Endoscopist: Jerene Bears , MD Age: 73 Referring MD:  Date of Birth: [birth year] Gender: Female Account #: [id number] Procedure:                Colonoscopy Indications:              Screening in patient at increased risk: Family                            history of 1st-degree relative with colorectal                            cancer, Last colonoscopy: 2015 Medicines:                Monitored Anesthesia Care Procedure:                Pre-Anesthesia Assessment:                           - Prior to the procedure, a History and Physical                            was performed, and patient medications and                            allergies were reviewed. The patient's tolerance of                            previous anesthesia was also reviewed. The risks                            and benefits of the procedure and the sedation                            options and risks were discussed with the patient.                            All questions were answered, and informed consent                            was obtained. Prior Anticoagulants: The patient has                            taken no previous anticoagulant or antiplatelet                            agents. ASA Grade Assessment: II - A patient with                            mild systemic disease. After reviewing the risks                            and benefits, the patient was deemed in  satisfactory condition to undergo the procedure.                           After obtaining informed consent, the colonoscope                            was passed under direct vision. Throughout the                            procedure, the patient's blood pressure, pulse, and                            oxygen saturations were monitored continuously. The                            Colonoscope was introduced  through the anus and                            advanced to the cecum, identified by appendiceal                            orifice and ileocecal valve. The colonoscopy was                            performed without difficulty. The patient tolerated                            the procedure well. The quality of the bowel                            preparation was good. The ileocecal valve,                            appendiceal orifice, and rectum were photographed. Scope In: 3:03:31 PM Scope Out: 3:17:30 PM Scope Withdrawal Time: 0 hours 10 minutes 8 seconds  Total Procedure Duration: 0 hours 13 minutes 59 seconds  Findings:                 The digital rectal exam was normal.                           A few small-mouthed diverticula were found in the                            sigmoid colon.                           The exam was otherwise without abnormality on                            direct and retroflexion views. Complications:            No immediate complications. Estimated Blood Loss:     Estimated blood loss: none. Impression:               - Diverticulosis in the sigmoid  colon.                           - The examination was otherwise normal on direct                            and retroflexion views.                           - No specimens collected. Recommendation:           - Patient has a contact number available for                            emergencies. The signs and symptoms of potential                            delayed complications were discussed with the                            patient. Return to normal activities tomorrow.                            Written discharge instructions were provided to the                            patient.                           - Resume previous diet.                           - Continue present medications.                           - Repeat colonoscopy in 5 years for screening                            purposes. Jerene Bears, MD 03/12/2019 3:33:24 PM This report has been signed electronically.

## 2019-03-12 NOTE — Progress Notes (Signed)
Called to room to assist during endoscopic procedure.  Patient ID and intended procedure confirmed with present staff. Received instructions for my participation in the procedure from the performing physician.  

## 2019-03-12 NOTE — Progress Notes (Signed)
Pt's states no medical or surgical changes since previsit or office visit. 

## 2019-03-12 NOTE — Patient Instructions (Signed)
RESUME PROTONIX (PANTOPRAZOLE) Until at least late October.  Follow up with Dr Hilarie Fredrickson then.  HANDOUTS GIVEN FOR ESOPHAGITIS, HIATAL HERNIA AND DIVERTICULOSIS.   YOU HAD AN ENDOSCOPIC PROCEDURE TODAY AT [facility]:   Refer to the procedure report that was given to you for any specific questions about what was found during the examination.  If the procedure report does not answer your questions, please call your gastroenterologist to clarify.  If you requested that your care partner not be given the details of your procedure findings, then the procedure report has been included in a sealed envelope for you to review at your convenience later.  YOU SHOULD EXPECT: Some feelings of bloating in the abdomen. Passage of more gas than usual.  Walking can help get rid of the air that was put into your GI tract during the procedure and reduce the bloating. If you had a lower endoscopy (such as a colonoscopy or flexible sigmoidoscopy) you may notice spotting of blood in your stool or on the toilet paper. If you underwent a bowel prep for your procedure, you may not have a normal bowel movement for a few days.  Please Note:  You might notice some irritation and congestion in your nose or some drainage.  This is from the oxygen used during your procedure.  There is no need for concern and it should clear up in a day or so.  SYMPTOMS TO REPORT IMMEDIATELY:   Following lower endoscopy (colonoscopy or flexible sigmoidoscopy):  Excessive amounts of blood in the stool  Significant tenderness or worsening of abdominal pains  Swelling of the abdomen that is new, acute  Fever of 100F or higher   Following upper endoscopy (EGD)  Vomiting of blood or coffee ground material  New chest pain or pain under the shoulder blades  Painful or persistently difficult swallowing  New shortness of breath  Fever of 100F or higher  Black, tarry-looking stools  For urgent or emergent issues, a  gastroenterologist can be reached at any hour by calling [phone].   DIET:  We do recommend a small meal at first, but then you may proceed to your regular diet.  Drink plenty of fluids but you should avoid alcoholic beverages for 24 hours.  ACTIVITY:  You should plan to take it easy for the rest of today and you should NOT DRIVE or use heavy machinery until tomorrow (because of the sedation medicines used during the test).    FOLLOW UP: Our staff will call the number listed on your records 48-72 hours following your procedure to check on you and address any questions or concerns that you may have regarding the information given to you following your procedure. If we do not reach you, we will leave a message.  We will attempt to reach you two times.  During this call, we will ask if you have developed any symptoms of COVID 19. If you develop any symptoms (ie: fever, flu-like symptoms, shortness of breath, cough etc.) before then, please call [phone].  If you test positive for Covid 19 in the 2 weeks post procedure, please call and report this information to [place].    If any biopsies were taken you will be contacted by phone or by letter within the next 1-3 weeks.  Please call us at [phone] if you have not heard about the biopsies in 3 weeks.    SIGNATURES/CONFIDENTIALITY: You and/or your care partner have signed paperwork which will be entered into  your electronic medical record.  These signatures attest to the fact that that the information above on your After Visit Summary has been reviewed and is understood.  Full responsibility of the confidentiality of this discharge information lies with you and/or your care-partner.

## 2019-03-12 NOTE — Op Note (Signed)
[facility] Patient Name: Lynn [name] Procedure Date: 03/12/2019 2:38 PM MRN: [id number] Endoscopist: Jerene Bears , MD Age: 73 Referring MD:  Date of Birth: 1946-02-02 Gender: Female Account #: [id number] Procedure:                Upper GI endoscopy Indications:              Dysphagia Medicines:                Monitored Anesthesia Care Procedure:                Pre-Anesthesia Assessment:                           - Prior to the procedure, a History and Physical                            was performed, and patient medications and                            allergies were reviewed. The patient's tolerance of                            previous anesthesia was also reviewed. The risks                            and benefits of the procedure and the sedation                            options and risks were discussed with the patient.                            All questions were answered, and informed consent                            was obtained. Prior Anticoagulants: The patient has                            taken no previous anticoagulant or antiplatelet                            agents. ASA Grade Assessment: II - A patient with                            mild systemic disease. After reviewing the risks                            and benefits, the patient was deemed in                            satisfactory condition to undergo the procedure.                           After obtaining informed consent, the endoscope was  passed under direct vision. Throughout the                            procedure, the patient's blood pressure, pulse, and                            oxygen saturations were monitored continuously. The                            Endoscope was introduced through the mouth, and                            advanced to the second part of duodenum. The upper                            GI endoscopy was accomplished without  difficulty.                            The patient tolerated the procedure well. Scope In: Scope Out: Findings:                 LA Grade B (one or more mucosal breaks greater than                            5 mm, not extending between the tops of two mucosal                            folds) esophagitis with no bleeding was found at                            the gastroesophageal junction. Biopsies were taken                            with a cold forceps for histology.                           A 2 cm hiatal hernia was present with                            non-obstructing Schatzki's ring at GE junction.                           The entire examined stomach was normal. Biopsies                            were taken with a cold forceps for histology and                            Helicobacter pylori testing.                           Localized mild inflammation characterized by  erythema was found in the duodenal bulb.                           The second portion of the duodenum was normal. Complications:            No immediate complications. Estimated Blood Loss:     Estimated blood loss was minimal. Impression:               - LA Grade B reflux esophagitis. Biopsied.                           - 2 cm hiatal hernia.                           - Normal stomach. Biopsied.                           - Mild duodenitis.                           - Normal second portion of the duodenum. Recommendation:           - Patient has a contact number available for                            emergencies. The signs and symptoms of potential                            delayed complications were discussed with the                            patient. Return to normal activities tomorrow.                            Written discharge instructions were provided to the                            patient.                           - Resume previous diet.                           -  Continue present medications.                           - Await pathology results.                           - Begin pantoprazole 40 mg once daily to heal                            esophagitis. I expect dysphagia to resolve with                            esophagitis treatment. If swallowing trouble  persists please contact my office. Jerene Bears, MD 03/12/2019 3:29:38 PM This report has been signed electronically.

## 2019-03-12 NOTE — Progress Notes (Signed)
PT taken to PACU. Monitors in place. VSS. Report given to RN. 

## 2019-03-16 ENCOUNTER — Telehealth: Payer: Self-pay

## 2019-03-16 NOTE — Telephone Encounter (Signed)
  Follow up Call-  Call back number 03/12/2019  Post procedure Call Back phone  # [phone]  Permission to leave phone message Yes  Some recent data might be hidden     Patient questions:  Do you have a fever, pain , or abdominal swelling? No. Pain Score  0 *  Have you tolerated food without any problems? Yes.    Have you been able to return to your normal activities? Yes.    Do you have any questions about your discharge instructions: Diet   No. Medications  No. Follow up visit  No.  Do you have questions or concerns about your Care? No.  Actions: * If pain score is 4 or above: No action needed, pain <4. 1. Have you developed a fever since your procedure? no  2.   Have you had an respiratory symptoms (SOB or cough) since your procedure? no  3.   Have you tested positive for COVID 19 since your procedure no  4.   Have you had any family members/close contacts diagnosed with the COVID 19 since your procedure?  no   If yes to any of these questions please route to Joylene John, RN and Alphonsa Gin, Therapist, sports.

## 2019-03-16 NOTE — Telephone Encounter (Signed)
Called #[phone] and left a messaged we tried to reach pt for a follow up call. maw

## 2019-03-19 ENCOUNTER — Encounter: Payer: Self-pay | Admitting: Internal Medicine

## 2019-04-27 ENCOUNTER — Encounter: Payer: Self-pay | Admitting: Family Medicine

## 2019-04-27 ENCOUNTER — Other Ambulatory Visit: Payer: Self-pay

## 2019-04-27 ENCOUNTER — Ambulatory Visit (INDEPENDENT_AMBULATORY_CARE_PROVIDER_SITE_OTHER): Payer: Medicare Other | Admitting: Family Medicine

## 2019-04-27 VITALS — BP 138/90 | HR 74 | Temp 98.4°F | Ht 62.0 in | Wt 129.0 lb

## 2019-04-27 DIAGNOSIS — E785 Hyperlipidemia, unspecified: Secondary | ICD-10-CM | POA: Diagnosis not present

## 2019-04-27 DIAGNOSIS — E559 Vitamin D deficiency, unspecified: Secondary | ICD-10-CM

## 2019-04-27 DIAGNOSIS — Z23 Encounter for immunization: Secondary | ICD-10-CM | POA: Diagnosis not present

## 2019-04-27 DIAGNOSIS — N814 Uterovaginal prolapse, unspecified: Secondary | ICD-10-CM | POA: Diagnosis not present

## 2019-04-27 DIAGNOSIS — R32 Unspecified urinary incontinence: Secondary | ICD-10-CM

## 2019-04-27 DIAGNOSIS — Z86010 Personal history of colonic polyps: Secondary | ICD-10-CM

## 2019-04-27 DIAGNOSIS — I10 Essential (primary) hypertension: Secondary | ICD-10-CM

## 2019-04-27 DIAGNOSIS — M816 Localized osteoporosis [Lequesne]: Secondary | ICD-10-CM

## 2019-04-27 DIAGNOSIS — H9193 Unspecified hearing loss, bilateral: Secondary | ICD-10-CM

## 2019-04-27 DIAGNOSIS — G629 Polyneuropathy, unspecified: Secondary | ICD-10-CM

## 2019-04-27 DIAGNOSIS — M8949 Other hypertrophic osteoarthropathy, multiple sites: Secondary | ICD-10-CM | POA: Diagnosis not present

## 2019-04-27 DIAGNOSIS — D692 Other nonthrombocytopenic purpura: Secondary | ICD-10-CM | POA: Insufficient documentation

## 2019-04-27 DIAGNOSIS — Z1159 Encounter for screening for other viral diseases: Secondary | ICD-10-CM

## 2019-04-27 DIAGNOSIS — M159 Polyosteoarthritis, unspecified: Secondary | ICD-10-CM

## 2019-04-27 LAB — CBC WITH DIFFERENTIAL/PLATELET
Basophils Absolute: 0 10*3/uL (ref 0.0–0.1)
Basophils Relative: 0.3 % (ref 0.0–3.0)
Eosinophils Absolute: 0.1 10*3/uL (ref 0.0–0.7)
Eosinophils Relative: 0.6 % (ref 0.0–5.0)
HCT: 41.7 % (ref 36.0–46.0)
Hemoglobin: 13.8 g/dL (ref 12.0–15.0)
Lymphocytes Relative: 7.5 % — ABNORMAL LOW (ref 12.0–46.0)
Lymphs Abs: 1 10*3/uL (ref 0.7–4.0)
MCHC: 33.2 g/dL (ref 30.0–36.0)
MCV: 97.6 fl (ref 78.0–100.0)
Monocytes Absolute: 1.1 10*3/uL — ABNORMAL HIGH (ref 0.1–1.0)
Monocytes Relative: 8.6 % (ref 3.0–12.0)
Neutro Abs: 11.1 10*3/uL — ABNORMAL HIGH (ref 1.4–7.7)
Neutrophils Relative %: 83 % — ABNORMAL HIGH (ref 43.0–77.0)
Platelets: 316 10*3/uL (ref 150.0–400.0)
RBC: 4.27 Mil/uL (ref 3.87–5.11)
RDW: 14 % (ref 11.5–15.5)
WBC: 13.3 10*3/uL — ABNORMAL HIGH (ref 4.0–10.5)

## 2019-04-27 LAB — COMPREHENSIVE METABOLIC PANEL
ALT: 26 U/L (ref 0–35)
AST: 15 U/L (ref 0–37)
Albumin: 4.1 g/dL (ref 3.5–5.2)
Alkaline Phosphatase: 76 U/L (ref 39–117)
BUN: 21 mg/dL (ref 6–23)
CO2: 29 mEq/L (ref 19–32)
Calcium: 10.1 mg/dL (ref 8.4–10.5)
Chloride: 100 mEq/L (ref 96–112)
Creatinine, Ser: 0.78 mg/dL (ref 0.40–1.20)
GFR: 72.24 mL/min (ref >60.00)
Glucose, Bld: 100 mg/dL — ABNORMAL HIGH (ref 70–99)
Potassium: 4.7 mEq/L (ref 3.5–5.1)
Sodium: 134 mEq/L — ABNORMAL LOW (ref 135–145)
Total Bilirubin: 0.6 mg/dL (ref 0.2–1.2)
Total Protein: 6.3 g/dL (ref 6.0–8.3)

## 2019-04-27 LAB — VITAMIN D 25 HYDROXY (VIT D DEFICIENCY, FRACTURES): VITD: 27.03 ng/mL — ABNORMAL LOW (ref 30.00–100.00)

## 2019-04-27 LAB — LDL CHOLESTEROL, DIRECT: Direct LDL: 116 mg/dL

## 2019-04-27 MED ORDER — DICLOFENAC SODIUM 75 MG PO TBEC: 75.0000 mg | DELAYED_RELEASE_TABLET | Freq: Two times a day (BID) | ORAL | 3 refills | Status: DC

## 2019-04-27 MED ORDER — VITAMIN D (ERGOCALCIFEROL) 1.25 MG (50000 UNIT) PO CAPS: 50000.0000 [IU] | ORAL_CAPSULE | ORAL | 0 refills | Status: DC

## 2019-04-27 MED ORDER — LOSARTAN POTASSIUM 50 MG PO TABS: 50.0000 mg | ORAL_TABLET | Freq: Every day | ORAL | 3 refills | Status: DC

## 2019-04-27 MED ORDER — HYDROCHLOROTHIAZIDE 12.5 MG PO CAPS: ORAL_CAPSULE | ORAL | 3 refills | Status: DC

## 2019-04-27 NOTE — Progress Notes (Signed)
Phone: [phone]   Subjective:  Patient presents today to establish care.  Prior patient of Dr. Redge Gainer  Chief Complaint  Patient presents with  . new patient   See problem oriented charting  The following were reviewed and entered/updated in epic: Past Medical History:  Diagnosis Date  . Anxiety   . Aortic atherosclerosis ([place])   . Arthritis   . Complication of anesthesia    per pt, slow to wake up past sedation.  . Depression   . Hyperplastic colon polyp   . Hypertension   . Neuropathy   . Osteoarthritis   . Osteoporosis   . Stomach upset    has a senstive stomach  . Throat clearing    has a cough at times  . Vitamin D deficiency    Patient Active Problem List   Diagnosis Date Noted  . Neuropathy 05/20/2017    Priority: Medium  . Osteoporosis 06/24/2013    Priority: Medium  . Hypertension 05/11/2013    Priority: Medium  . Osteoarthritis 05/11/2013    Priority: Medium  . Personal history of colonic polyps 05/11/2013    Priority: Medium  . Hyperlipidemia 05/11/2013    Priority: Medium  . Aortic atherosclerosis ([place]) 06/26/2017    Priority: Low  . Abnormality of gait due to impairment of balance 05/20/2017    Priority: Low  . Incontinence in female 06/25/2014    Priority: Low  . Hearing deficit 06/25/2014    Priority: Low  . Vitamin D deficiency 05/11/2013    Priority: Low  . Senile purpura ([place]) 04/27/2019   Past Surgical History:  Procedure Laterality Date  . COLONOSCOPY    . FACIAL COSMETIC SURGERY     over 15 years ago  . FINGER TENDON REPAIR     middle rt hand-  . HAMMER TOE SURGERY  12/27/2011   Procedure: HAMMER TOE CORRECTION;  Surgeon: Wylene Simmer, MD;  Location: [facility];  Service: Orthopedics;  Laterality: Left;  2-4th    Family History  Problem Relation Age of Onset  . Arthritis Mother   . Colon cancer Father   . COPD Father        died at 39  . Throat cancer Brother   . Esophageal cancer Neg Hx   . Rectal  cancer Neg Hx   . Stomach cancer Neg Hx     Medications- reviewed and updated Current Outpatient Medications  Medication Sig Dispense Refill  . aspirin 81 MG tablet Take 81 mg by mouth daily.    . diclofenac (VOLTAREN) 75 MG EC tablet Take 1 tablet (75 mg total) by mouth 2 (two) times daily. 180 tablet 3  . hydrochlorothiazide (MICROZIDE) 12.5 MG capsule TAKE 1 CAPSULE(12.5 MG) BY MOUTH DAILY 90 capsule 3  . losartan (COZAAR) 50 MG tablet Take 1 tablet (50 mg total) by mouth daily. 90 tablet 3  . pantoprazole (PROTONIX) 40 MG tablet Take 1 tablet (40 mg total) by mouth daily. 90 tablet 1  . Vitamin D, Ergocalciferol, (DRISDOL) 50000 units CAPS capsule Take 1 capsule (50,000 Units total) by mouth every 7 (seven) days. 12 capsule 3   No current facility-administered medications for this visit.    Allergies-reviewed and updated No Known Allergies  Social History   Social History Narrative   Divorced. Children: 1 son. Patient supports her mother- mother lives with her- 2 story home.       Retired from [employer]  Education: college.  Hobbies: riding horses in the past, family time, enjoys some gardening   ROS--Full ROS was completed Review of Systems  Constitutional: Negative for chills and fever.  HENT: Positive for hearing loss and tinnitus. Negative for ear discharge.   Eyes: Negative for blurred vision and double vision.  Respiratory: Negative for cough and hemoptysis.   Cardiovascular: Negative for chest pain and palpitations.  Gastrointestinal: Positive for heartburn (has been todl esophagus inflammed). Negative for nausea and vomiting.  Genitourinary: Positive for urgency. Negative for dysuria.  Musculoskeletal: Positive for back pain and joint pain.  Skin: Negative for itching and rash.  Neurological: Positive for dizziness (slightly off balance- could be neuropathy related). Negative for tingling and headaches.  Endo/Heme/Allergies: Negative for  polydipsia. Bruises/bleeds easily.  Psychiatric/Behavioral: Negative for hallucinations. The patient has insomnia (at times).    Objective  Objective:  BP 138/90   Pulse 74   Temp 98.4 F (36.9 C)   Ht 5\' 2"  (1.575 m)   Wt 129 lb (58.5 kg)   SpO2 99%   BMI 23.59 kg/m  Gen: NAD, resting comfortably HEENT: Mucous membranes are moist. Oropharynx normal. TM normal. Eyes: sclera and lids normal, PERRLA Neck: no thyromegaly, no cervical lymphadenopathy CV: RRR no murmurs rubs or gallops Lungs: CTAB no crackles, wheeze, rhonchi Abdomen: soft/nontender/nondistended/normal bowel sounds. No rebound or guarding.  Ext: no edema Skin: warm, dry Neuro: 5/5 strength in upper and lower extremities, normal gait, normal reflexes   Assessment and Plan:   #hypertension S: poorly controlled on no rx. Has been out for sometime- at least a few weeks.  losartan 50 mg and hctz 12.5 mg on repeat today BP Readings from Last 3 Encounters:  04/27/19 138/90  03/12/19 117/74  09/19/18 (!) 148/82  A/P: Poor control today but has been out of medicine for a few weeks- encouraged her to restart losartan and hydrochlorothiazide and update me in 2 to 4 weeks  #hyperlipidemia S: Poorly controlled on no medication Lab Results  Component Value Date   CHOL 208 (H) 08/05/2018   HDL 65 08/05/2018   LDLCALC 122 (H) 08/05/2018   TRIG 106 08/05/2018   CHOLHDL 3.2 08/05/2018   A/P: 10-year ASCVD risk 19.6%-suggested once a week statin- she is reluctant at this time due to concern of side effects.  She would like to recheck a direct LDL today- likely continue to discuss pulse dose therapy each visit  # Vitamin D deficiency/ osteoporosis S: In the past on 50,000 units vitamin D weekly.  Not currently taking any medicine  Patient very concerned about Fosamax side effects as well as Reclast-worried about osteonecrosis of the jaw risks.  For Fosamax worries about GERD.  Last bone density last year with worst T score  -2.7 A/P: Patient would like to hold off on medication at this time for osteoporosis-encouraged her to please take vitamin D-may need higher dose depending on levels.  Recheck bone density next year  # Arthritis- hands, low back, hips S: Patient takes diclofenac once a day for ongoing issues over the years with hands, low back, hips A/P: Discussed with patient potential risks of long-term NSAIDs including kidney injury, cardiac risk, GI risks-we will need to check BMP at least every 6 months  # Neuropathy- follows with Dr. Sherlynn Carbon had extensive work-up.  No regular medication used  # prolapsed uterus S: patient able to feel her uterus when she wipes herself. Also wears a liner and has some urgency issues at night.  Has some incontinence as  well which could be related A/P: Patient reports prolapsed uterus-would like referral to gynecology to discuss options.  - encouraged kegel exercies  # counseling for anxiety in the past-doing better now and not requiring counseling   Senile purpura ([place]) S: Patient reports easy bruising and bleeding over the years.  Dermatology told her she was "thin skin" A/P: Check CBC to make sure no thrombocytopenia but doubt- aspirin use likely contributes.  I do not feel strongly about stopping her aspirin since she is reluctant to take a statin.  Continue to monitor-appears stable  Recommended follow up: 55-month physical recommended Future Appointments  Date Time Provider [place]  09/10/2019  8:30 AM Cameron Sprang, MD [facility] None   Cereal and banana and cookie Meds ordered this encounter  Medications  . diclofenac (VOLTAREN) 75 MG EC tablet    Sig: Take 1 tablet (75 mg total) by mouth 2 (two) times daily.    Dispense:  180 tablet    Refill:  3  . hydrochlorothiazide (MICROZIDE) 12.5 MG capsule    Sig: TAKE 1 CAPSULE(12.5 MG) BY MOUTH DAILY    Dispense:  90 capsule    Refill:  3  . losartan (COZAAR) 50 MG tablet    Sig: Take 1 tablet (50  mg total) by mouth daily.    Dispense:  90 tablet    Refill:  3   Return precautions advised. Garret Reddish, MD

## 2019-04-27 NOTE — Assessment & Plan Note (Signed)
S: In the past on 50,000 units vitamin D weekly.  Not currently taking any medicine  Patient very concerned about Fosamax side effects as well as Reclast-worried about osteonecrosis of the jaw risks.  For Fosamax worries about GERD.  Last bone density last year with worst T score -2.7 A/P: Patient would like to hold off on medication at this time for osteoporosis-encouraged her to please take vitamin D-may need higher dose depending on levels.  Recheck bone density next year

## 2019-04-27 NOTE — Assessment & Plan Note (Signed)
S: Patient reports easy bruising and bleeding over the years.  Dermatology told her she was "thin skin" A/P: Check CBC to make sure no thrombocytopenia but doubt- aspirin use likely contributes.  I do not feel strongly about stopping her aspirin since she is reluctant to take a statin.  Continue to monitor-appears stable

## 2019-04-27 NOTE — Assessment & Plan Note (Signed)
S: Patient takes diclofenac once a day for ongoing issues over the years with hands, low back, hips A/P: Discussed with patient potential risks of long-term NSAIDs-we will need to check BMP at least every 6 months

## 2019-04-27 NOTE — Addendum Note (Signed)
Addended by: Marin Olp on: 04/27/2019 08:27 PM   Modules accepted: Orders

## 2019-04-27 NOTE — Patient Instructions (Addendum)
Health Maintenance Due  Topic Date Due  . Hepatitis C Screening - screen with labs 03/16/1946  . MAMMOGRAM -please call to schedule your follow-up mammogram with the breast center 01/09/2019  . INFLUENZA VACCINE -today, high-dose/senior shot 02/21/2019   Please let me know in 2-4 weeks how blood pressures are doing at home.  We want to make sure your blood pressure is better when you restart medication  Even if not on high dose vitamin D I would recommend 1000 units daily to help keep your levels up  We will call you within two weeks about your referral to gynecology. If you do not hear within 3 weeks, give [place] a call.   Schedule 6 month physical before you go  Please stop by lab before you go If you do not have mychart- we will call you about results within 5 business days of [place] receiving them.  If you have mychart- we will send your results within 3 business days of [place] receiving them.  If abnormal or we want to clarify a result, we will call or mychart you to make sure you receive the message.  If you have questions or concerns or don't hear within 5-7 days, please send [place] a message or call us.

## 2019-04-27 NOTE — Assessment & Plan Note (Signed)
#   Neuropathy- follows with Dr. Sherlynn Carbon had extensive work-up.  No regular medication used

## 2019-04-28 ENCOUNTER — Other Ambulatory Visit: Payer: Self-pay

## 2019-04-28 DIAGNOSIS — D72829 Elevated white blood cell count, unspecified: Secondary | ICD-10-CM

## 2019-04-28 LAB — HEPATITIS C ANTIBODY
Hepatitis C Ab: NONREACTIVE
SIGNAL TO CUT-OFF: 0.01 (ref <1.00)

## 2019-04-28 NOTE — Progress Notes (Signed)
Called pt to discuss lab results, she stated it was not a good time and would like to be called later on

## 2019-05-01 ENCOUNTER — Other Ambulatory Visit: Payer: Self-pay

## 2019-05-05 ENCOUNTER — Other Ambulatory Visit (HOSPITAL_COMMUNITY)
Admission: RE | Admit: 2019-05-05 | Discharge: 2019-05-05 | Disposition: A | Discharge status: Home / Self Care | Payer: Medicare Other | Source: Ambulatory Visit | Attending: Obstetrics and Gynecology | Admitting: Obstetrics and Gynecology

## 2019-05-05 ENCOUNTER — Ambulatory Visit (INDEPENDENT_AMBULATORY_CARE_PROVIDER_SITE_OTHER): Payer: Medicare Other | Admitting: Obstetrics and Gynecology

## 2019-05-05 ENCOUNTER — Other Ambulatory Visit: Payer: Self-pay

## 2019-05-05 ENCOUNTER — Encounter: Payer: Self-pay | Admitting: Obstetrics and Gynecology

## 2019-05-05 VITALS — BP 122/70 | HR 88 | Temp 97.2°F | Resp 18 | Ht 62.0 in | Wt 127.8 lb

## 2019-05-05 DIAGNOSIS — N812 Incomplete uterovaginal prolapse: Secondary | ICD-10-CM

## 2019-05-05 DIAGNOSIS — N898 Other specified noninflammatory disorders of vagina: Secondary | ICD-10-CM

## 2019-05-05 DIAGNOSIS — Z124 Encounter for screening for malignant neoplasm of cervix: Secondary | ICD-10-CM | POA: Insufficient documentation

## 2019-05-05 DIAGNOSIS — N8110 Cystocele, unspecified: Secondary | ICD-10-CM

## 2019-05-05 NOTE — Progress Notes (Signed)
GYNECOLOGY  VISIT   HPI: 73 y.o.   Divorced  Caucasian  female   G1P1001 with Patient's last menstrual period was 07/23/1998 (approximate).   here for uterine prolapse.  States she felt something vaginally when she was washing for one or more years.  She has been told she has slight prolapse with her prior provider.   She has some urgency and rare incontinence.  Can leak with a sneeze.  No leak with cough, laugh, or exercise.  Denies difficulty voiding.  Denies dysuria.  She wears a panty liner.   She denies diarrhea.  No straining to have BMs. Denies fecal incontinence.   Not sexually active.  She notes a vaginal odor.   She also gets at night to void.   Can range from 0 - 2 times per night.   One vaginal delivery, 7 pounds 14 ounces.   GYNECOLOGIC HISTORY: Patient's last menstrual period was 07/23/1998 (approximate). Contraception:  Postmenopausal Menopausal hormone therapy:  none Last mammogram: 01-08-17 Neg/density C/BiRads1 Last pap smear: 05-17-17 Neg, 04-24-16 ASCUS, 04-13-14 Neg        OB History    Gravida  1   Para  1   Term  1   Preterm      AB      Living  1     SAB      TAB      Ectopic      Multiple      Live Births                 Patient Active Problem List   Diagnosis Date Noted  . Senile purpura ([place]) 04/27/2019  . Prolapsed uterus 04/27/2019  . Aortic atherosclerosis ([place]) 06/26/2017  . Neuropathy 05/20/2017  . Abnormality of gait due to impairment of balance 05/20/2017  . Incontinence in female 06/25/2014  . Hearing deficit 06/25/2014  . Osteoporosis 06/24/2013  . Hypertension 05/11/2013  . Osteoarthritis 05/11/2013  . Vitamin D deficiency 05/11/2013  . Personal history of colonic polyps 05/11/2013  . Hyperlipidemia 05/11/2013    Past Medical History:  Diagnosis Date  . Anxiety   . Aortic atherosclerosis ([place])   . Arthritis   . Complication of anesthesia    per pt, slow to wake up past sedation.  . Depression    . Hyperplastic colon polyp   . Hypertension   . Neuropathy   . Osteoarthritis   . Osteoporosis   . Stomach upset    has a senstive stomach  . Throat clearing    has a cough at times  . Urinary incontinence   . Vitamin D deficiency     Past Surgical History:  Procedure Laterality Date  . COLONOSCOPY    . FACIAL COSMETIC SURGERY     over 15 years ago  . FINGER TENDON REPAIR     middle rt hand-  . HAMMER TOE SURGERY  12/27/2011   Procedure: HAMMER TOE CORRECTION;  Surgeon: Wylene Simmer, MD;  Location: [facility];  Service: Orthopedics;  Laterality: Left;  2-4th    Current Outpatient Medications  Medication Sig Dispense Refill  . aspirin 81 MG tablet Take 81 mg by mouth daily.    . diclofenac (VOLTAREN) 75 MG EC tablet Take 1 tablet (75 mg total) by mouth 2 (two) times daily. 180 tablet 3  . hydrochlorothiazide (MICROZIDE) 12.5 MG capsule TAKE 1 CAPSULE(12.5 MG) BY MOUTH DAILY 90 capsule 3  . losartan (COZAAR) 50 MG tablet Take 1 tablet (  50 mg total) by mouth daily. 90 tablet 3  . pantoprazole (PROTONIX) 40 MG tablet Take 1 tablet (40 mg total) by mouth daily. 90 tablet 1  . Vitamin D, Ergocalciferol, (DRISDOL) 1.25 MG (50000 UT) CAPS capsule Take 1 capsule (50,000 Units total) by mouth every 7 (seven) days. 12 capsule 0   No current facility-administered medications for this visit.      ALLERGIES: Patient has no known allergies.  Family History  Problem Relation Age of Onset  . Arthritis Mother   . Colon cancer Father   . COPD Father 40       died at 63  . Throat cancer Brother   . Breast cancer Other   . Esophageal cancer Neg Hx   . Rectal cancer Neg Hx   . Stomach cancer Neg Hx     Social History   Socioeconomic History  . Marital status: Divorced    Spouse name: Not on file  . Number of children: 1  . Years of education: 4  . Highest education level: Not on file  Occupational History  . Not on file  Social Needs  . Financial resource  strain: Not on file  . Food insecurity    Worry: Not on file    Inability: Not on file  . Transportation needs    Medical: Not on file    Non-medical: Not on file  Tobacco Use  . Smoking status: Former Smoker    Quit date: 12/21/1975    Years since quitting: 43.4  . Smokeless tobacco: Never Used  Substance and Sexual Activity  . Alcohol use: Yes    Comment: rarely  . Drug use: No  . Sexual activity: Not Currently  Lifestyle  . Physical activity    Days per week: Not on file    Minutes per session: Not on file  . Stress: Not on file  Relationships  . Social Herbalist on phone: Not on file    Gets together: Not on file    Attends religious service: Not on file    Active member of club or organization: Not on file    Attends meetings of clubs or organizations: Not on file    Relationship status: Not on file  . Intimate partner violence    Fear of current or ex partner: Not on file    Emotionally abused: Not on file    Physically abused: Not on file    Forced sexual activity: Not on file  Other Topics Concern  . Not on file  Social History Narrative   Divorced. Children: 1 son. Patient supports her mother- mother lives with her- 2 story home.       Retired from [employer] ([employer]  Education: college.       Hobbies: riding horses in the past, family time, enjoys some gardening    Review of Systems  All other systems reviewed and are negative.   PHYSICAL EXAMINATION:    BP 122/70   Pulse 88   Temp (!) 97.2 F (36.2 C) (Temporal)   Resp 18   Ht 5\' 2"  (1.575 m)   Wt 127 lb 12.8 oz (58 kg)   LMP 07/23/1998 (Approximate)   BMI 23.37 kg/m     General appearance: alert, cooperative and appears stated age Head: Normocephalic, without obvious abnormality, atraumatic Lungs: clear to auscultation bilaterally Heart: regular rate and rhythm Abdomen: soft, non-tender, no masses,  no organomegaly Extremities: extremities normal, atraumatic, no cyanosis or  edema Skin: Skin color, texture, turgor normal. No rashes or lesions No abnormal inguinal nodes palpated Neurologic: Grossly normal  Pelvic: External genitalia:  no lesions              Urethra:  normal appearing urethra with no masses, tenderness or lesions              Bartholins and Skenes: normal                 Vagina: normal appearing vagina with normal color and discharge, no lesions.  Second degree cystocele, first degree uterine prolapse, no rectocele.              Cervix: no lesions                Bimanual Exam:  Uterus:  normal size, contour, position, consistency, mobility, non-tender              Adnexa: no mass, fullness, tenderness              Rectal exam: Yes.  .  Confirms.              Anus:  normal sphincter tone, no lesions  Patient examined in lithotomy and standing position.   Chaperone was present for exam.  ASSESSMENT  Incomplete uterovaginal prolapse.  Vaginal odor.  Hx ASCUS pap.  Current elevated WBC.   PLAN  We discussed prolapse of her uterus and bladder today, and I showed her pelvic anatomy with a 3D pelvic model. I reviewed risk factors for prolapse, signs and symptoms, and treatment options: observation, pelvic floor therapy, pessary use, and surgical care.  Surgical care would include hysterectomy with bilateral salpingo-oophorectomy, anterior colporrhaphy, vaginal cuff suspension, and cystoscopy.  She declines surgery and will consider her other options.  ACOG HO on prolapse to patient.  Pap collected today.  Affirm collected and we talked about vaginitis as well.    An After Visit Summary was printed and given to the patient.  ___30___ minutes face to face time of which over 50% was spent in counseling.

## 2019-05-06 ENCOUNTER — Telehealth: Payer: Self-pay | Admitting: Obstetrics and Gynecology

## 2019-05-06 LAB — VAGINITIS/VAGINOSIS, DNA PROBE
Candida Species: NEGATIVE
Gardnerella vaginalis: NEGATIVE
Trichomonas vaginosis: NEGATIVE

## 2019-05-06 NOTE — Telephone Encounter (Signed)
Patient says she received a call from office. No message in system.

## 2019-05-06 NOTE — Telephone Encounter (Signed)
Call returned to patient. Advised as seen below per Dr. Quincy Simmonds. Patient verbalizes understanding and is agreeable.    Notes recorded by Nunzio Cobbs, MD on 05/06/2019 at 12:55 PM EDT  Please inform patient that her vaginitis testing is negative for infection.   Her pap is not back yet.   I am highlighting this result so you know to contact the patient.    Encounter closed.

## 2019-05-07 LAB — CYTOLOGY - PAP: Diagnosis: NEGATIVE

## 2019-05-29 ENCOUNTER — Other Ambulatory Visit: Payer: Self-pay

## 2019-05-29 ENCOUNTER — Other Ambulatory Visit (INDEPENDENT_AMBULATORY_CARE_PROVIDER_SITE_OTHER): Payer: Medicare Other

## 2019-05-29 DIAGNOSIS — D72829 Elevated white blood cell count, unspecified: Secondary | ICD-10-CM

## 2019-05-29 LAB — CBC WITH DIFFERENTIAL/PLATELET
Basophils Absolute: 0 10*3/uL (ref 0.0–0.1)
Basophils Relative: 0.3 % (ref 0.0–3.0)
Eosinophils Absolute: 0.2 10*3/uL (ref 0.0–0.7)
Eosinophils Relative: 1.6 % (ref 0.0–5.0)
HCT: 41 % (ref 36.0–46.0)
Hemoglobin: 14 g/dL (ref 12.0–15.0)
Lymphocytes Relative: 9.3 % — ABNORMAL LOW (ref 12.0–46.0)
Lymphs Abs: 0.9 10*3/uL (ref 0.7–4.0)
MCHC: 34.2 g/dL (ref 30.0–36.0)
MCV: 96.6 fl (ref 78.0–100.0)
Monocytes Absolute: 1 10*3/uL (ref 0.1–1.0)
Monocytes Relative: 9.8 % (ref 3.0–12.0)
Neutro Abs: 7.9 10*3/uL — ABNORMAL HIGH (ref 1.4–7.7)
Neutrophils Relative %: 79 % — ABNORMAL HIGH (ref 43.0–77.0)
Platelets: 341 10*3/uL (ref 150.0–400.0)
RBC: 4.25 Mil/uL (ref 3.87–5.11)
RDW: 13.8 % (ref 11.5–15.5)
WBC: 9.9 10*3/uL (ref 4.0–10.5)

## 2019-06-01 LAB — PATHOLOGIST SMEAR REVIEW

## 2019-06-23 ENCOUNTER — Other Ambulatory Visit: Payer: Self-pay

## 2019-07-03 ENCOUNTER — Telehealth: Payer: Self-pay | Admitting: Obstetrics and Gynecology

## 2019-07-03 NOTE — Telephone Encounter (Signed)
Patient said she never received results from pap done in October.

## 2019-07-03 NOTE — Telephone Encounter (Signed)
Per review of 05/05/19 pap  Nunzio Cobbs, MD  05/11/2019 1:24 PM EDT    Pap normal.  Annual exam recall - 02.   Spoke with patient, advised per Dr. Quincy Simmonds. Patient request copy of 05/05/19 pap be mailed to address on file. Pap report printed and placed in [place] standard mail. Patient verbalizes understanding and is agreeable.    Routing to provider for final review. Patient is agreeable to disposition. Will close encounter.

## 2019-08-20 ENCOUNTER — Ambulatory Visit: Payer: Medicare Other

## 2019-08-27 ENCOUNTER — Ambulatory Visit: Payer: Medicare Other | Attending: Internal Medicine

## 2019-08-27 DIAGNOSIS — Z23 Encounter for immunization: Secondary | ICD-10-CM

## 2019-08-27 NOTE — Progress Notes (Signed)
   Covid-19 Vaccination Clinic  Name:  SABIRA NEVELLS    MRN: [id number] DOB: [birth year]  08/27/2019  Ms. Hagner was observed post Covid-19 immunization for 15 minutes without incidence. She was provided with Vaccine Information Sheet and instruction to access the V-Safe system.   Ms. Fusilier was instructed to call 911 with any severe reactions post vaccine: Marland Kitchen Difficulty breathing  . Swelling of your face and throat  . A fast heartbeat  . A bad rash all over your body  . Dizziness and weakness    Immunizations Administered    Name Date Dose VIS Date Route   Pfizer COVID-19 Vaccine 08/27/2019  9:32 AM 0.3 mL 07/03/2019 Intramuscular   Manufacturer: [place]   Lot: [id number]   [place]: [id number]

## 2019-09-10 ENCOUNTER — Ambulatory Visit: Payer: Medicare Other | Admitting: Neurology

## 2019-09-10 ENCOUNTER — Ambulatory Visit: Payer: Medicare Other

## 2019-09-21 ENCOUNTER — Ambulatory Visit: Payer: Medicare Other | Attending: Internal Medicine

## 2019-09-21 DIAGNOSIS — Z23 Encounter for immunization: Secondary | ICD-10-CM | POA: Insufficient documentation

## 2019-09-21 NOTE — Progress Notes (Signed)
   Covid-19 Vaccination Clinic  Name:  Lynn [name]    MRN: [id number] DOB: 11-Oct-1945  09/21/2019  Lynn [name] was observed post Covid-19 immunization for 15 minutes without incidence. She was provided with Vaccine Information Sheet and instruction to access the V-Safe system.   Lynn [name] was instructed to call 911 with any severe reactions post vaccine: Marland Kitchen Difficulty breathing  . Swelling of your face and throat  . A fast heartbeat  . A bad rash all over your body  . Dizziness and weakness    Immunizations Administered    Name Date Dose VIS Date Route   Pfizer COVID-19 Vaccine 09/21/2019  1:58 PM 0.3 mL 07/03/2019 Intramuscular   Manufacturer: [place]   Lot: [id number]   [place]: [id number]

## 2019-10-27 ENCOUNTER — Encounter: Payer: Medicare Other | Admitting: Family Medicine

## 2019-10-27 ENCOUNTER — Ambulatory Visit: Payer: Medicare Other

## 2019-11-10 ENCOUNTER — Inpatient Hospital Stay (HOSPITAL_COMMUNITY)
Admission: EM | Admit: 2019-11-10 | Discharge: 2019-11-24 | DRG: 065 | Disposition: A | Discharge status: Skilled Nursing Facility | Payer: Medicare Other | Attending: Internal Medicine | Admitting: Internal Medicine

## 2019-11-10 ENCOUNTER — Encounter (HOSPITAL_COMMUNITY): Payer: Self-pay | Admitting: Emergency Medicine

## 2019-11-10 ENCOUNTER — Emergency Department (HOSPITAL_COMMUNITY): Payer: Medicare Other

## 2019-11-10 ENCOUNTER — Other Ambulatory Visit: Payer: Self-pay

## 2019-11-10 DIAGNOSIS — E440 Moderate protein-calorie malnutrition: Secondary | ICD-10-CM | POA: Diagnosis present

## 2019-11-10 DIAGNOSIS — R7989 Other specified abnormal findings of blood chemistry: Secondary | ICD-10-CM

## 2019-11-10 DIAGNOSIS — Z7982 Long term (current) use of aspirin: Secondary | ICD-10-CM

## 2019-11-10 DIAGNOSIS — D72829 Elevated white blood cell count, unspecified: Secondary | ICD-10-CM | POA: Diagnosis present

## 2019-11-10 DIAGNOSIS — K5900 Constipation, unspecified: Secondary | ICD-10-CM | POA: Diagnosis not present

## 2019-11-10 DIAGNOSIS — E785 Hyperlipidemia, unspecified: Secondary | ICD-10-CM | POA: Diagnosis present

## 2019-11-10 DIAGNOSIS — R29810 Facial weakness: Secondary | ICD-10-CM | POA: Diagnosis present

## 2019-11-10 DIAGNOSIS — I10 Essential (primary) hypertension: Secondary | ICD-10-CM | POA: Diagnosis present

## 2019-11-10 DIAGNOSIS — E871 Hypo-osmolality and hyponatremia: Secondary | ICD-10-CM | POA: Diagnosis present

## 2019-11-10 DIAGNOSIS — R4182 Altered mental status, unspecified: Secondary | ICD-10-CM | POA: Diagnosis not present

## 2019-11-10 DIAGNOSIS — M810 Age-related osteoporosis without current pathological fracture: Secondary | ICD-10-CM | POA: Diagnosis present

## 2019-11-10 DIAGNOSIS — E876 Hypokalemia: Secondary | ICD-10-CM | POA: Diagnosis present

## 2019-11-10 DIAGNOSIS — R471 Dysarthria and anarthria: Secondary | ICD-10-CM | POA: Diagnosis present

## 2019-11-10 DIAGNOSIS — R339 Retention of urine, unspecified: Secondary | ICD-10-CM | POA: Diagnosis not present

## 2019-11-10 DIAGNOSIS — Z79899 Other long term (current) drug therapy: Secondary | ICD-10-CM | POA: Diagnosis not present

## 2019-11-10 DIAGNOSIS — Z6822 Body mass index (BMI) 22.0-22.9, adult: Secondary | ICD-10-CM

## 2019-11-10 DIAGNOSIS — R1310 Dysphagia, unspecified: Secondary | ICD-10-CM | POA: Diagnosis present

## 2019-11-10 DIAGNOSIS — M1990 Unspecified osteoarthritis, unspecified site: Secondary | ICD-10-CM | POA: Diagnosis not present

## 2019-11-10 DIAGNOSIS — R338 Other retention of urine: Secondary | ICD-10-CM | POA: Diagnosis not present

## 2019-11-10 DIAGNOSIS — Z20822 Contact with and (suspected) exposure to covid-19: Secondary | ICD-10-CM | POA: Diagnosis present

## 2019-11-10 DIAGNOSIS — F32A Depression, unspecified: Secondary | ICD-10-CM

## 2019-11-10 DIAGNOSIS — R29728 NIHSS score 28: Secondary | ICD-10-CM | POA: Diagnosis not present

## 2019-11-10 DIAGNOSIS — G629 Polyneuropathy, unspecified: Secondary | ICD-10-CM | POA: Diagnosis not present

## 2019-11-10 DIAGNOSIS — M7730 Calcaneal spur, unspecified foot: Secondary | ICD-10-CM | POA: Diagnosis present

## 2019-11-10 DIAGNOSIS — M25571 Pain in right ankle and joints of right foot: Secondary | ICD-10-CM | POA: Diagnosis not present

## 2019-11-10 DIAGNOSIS — F329 Major depressive disorder, single episode, unspecified: Secondary | ICD-10-CM | POA: Diagnosis present

## 2019-11-10 DIAGNOSIS — R29705 NIHSS score 5: Secondary | ICD-10-CM | POA: Diagnosis present

## 2019-11-10 DIAGNOSIS — I63313 Cerebral infarction due to thrombosis of bilateral middle cerebral arteries: Secondary | ICD-10-CM | POA: Diagnosis not present

## 2019-11-10 DIAGNOSIS — E860 Dehydration: Secondary | ICD-10-CM | POA: Diagnosis present

## 2019-11-10 DIAGNOSIS — Z791 Long term (current) use of non-steroidal anti-inflammatories (NSAID): Secondary | ICD-10-CM | POA: Diagnosis not present

## 2019-11-10 DIAGNOSIS — M21371 Foot drop, right foot: Secondary | ICD-10-CM | POA: Diagnosis present

## 2019-11-10 DIAGNOSIS — Z87891 Personal history of nicotine dependence: Secondary | ICD-10-CM

## 2019-11-10 DIAGNOSIS — R4701 Aphasia: Secondary | ICD-10-CM | POA: Diagnosis present

## 2019-11-10 DIAGNOSIS — G8194 Hemiplegia, unspecified affecting left nondominant side: Secondary | ICD-10-CM | POA: Diagnosis present

## 2019-11-10 DIAGNOSIS — I639 Cerebral infarction, unspecified: Secondary | ICD-10-CM | POA: Diagnosis present

## 2019-11-10 DIAGNOSIS — N319 Neuromuscular dysfunction of bladder, unspecified: Secondary | ICD-10-CM | POA: Diagnosis present

## 2019-11-10 DIAGNOSIS — M79671 Pain in right foot: Secondary | ICD-10-CM

## 2019-11-10 DIAGNOSIS — F419 Anxiety disorder, unspecified: Secondary | ICD-10-CM | POA: Diagnosis present

## 2019-11-10 DIAGNOSIS — R531 Weakness: Secondary | ICD-10-CM | POA: Diagnosis present

## 2019-11-10 DIAGNOSIS — R404 Transient alteration of awareness: Secondary | ICD-10-CM

## 2019-11-10 DIAGNOSIS — R1319 Other dysphagia: Secondary | ICD-10-CM | POA: Diagnosis not present

## 2019-11-10 DIAGNOSIS — E7800 Pure hypercholesterolemia, unspecified: Secondary | ICD-10-CM | POA: Diagnosis not present

## 2019-11-10 DIAGNOSIS — R140 Abdominal distension (gaseous): Secondary | ICD-10-CM

## 2019-11-10 DIAGNOSIS — I6381 Other cerebral infarction due to occlusion or stenosis of small artery: Secondary | ICD-10-CM | POA: Diagnosis not present

## 2019-11-10 DIAGNOSIS — I6389 Other cerebral infarction: Secondary | ICD-10-CM | POA: Diagnosis not present

## 2019-11-10 HISTORY — DX: Cerebral infarction, unspecified: I63.9

## 2019-11-10 LAB — I-STAT CHEM 8, ED
BUN: 19 mg/dL (ref 8–23)
Calcium, Ion: 1.23 mmol/L (ref 1.15–1.40)
Chloride: 101 mmol/L (ref 98–111)
Creatinine, Ser: 0.7 mg/dL (ref 0.44–1.00)
Glucose, Bld: 86 mg/dL (ref 70–99)
HCT: 44 % (ref 36.0–46.0)
Hemoglobin: 15 g/dL (ref 12.0–15.0)
Potassium: 3.6 mmol/L (ref 3.5–5.1)
Sodium: 136 mmol/L (ref 135–145)
TCO2: 27 mmol/L (ref 22–32)

## 2019-11-10 LAB — URINALYSIS, ROUTINE W REFLEX MICROSCOPIC
Bilirubin Urine: NEGATIVE
Glucose, UA: NEGATIVE mg/dL
Hgb urine dipstick: NEGATIVE
Ketones, ur: 20 mg/dL — AB
Leukocytes,Ua: NEGATIVE
Nitrite: NEGATIVE
Protein, ur: NEGATIVE mg/dL
Specific Gravity, Urine: 1.035 — ABNORMAL HIGH (ref 1.005–1.030)
pH: 8 (ref 5.0–8.0)

## 2019-11-10 LAB — CBC
HCT: 48.2 % — ABNORMAL HIGH (ref 36.0–46.0)
Hemoglobin: 15.7 g/dL — ABNORMAL HIGH (ref 12.0–15.0)
MCH: 31.7 pg (ref 26.0–34.0)
MCHC: 32.6 g/dL (ref 30.0–36.0)
MCV: 97.2 fL (ref 80.0–100.0)
Platelets: 315 10*3/uL (ref 150–400)
RBC: 4.96 MIL/uL (ref 3.87–5.11)
RDW: 13.5 % (ref 11.5–15.5)
WBC: 10 10*3/uL (ref 4.0–10.5)
nRBC: 0 % (ref 0.0–0.2)

## 2019-11-10 LAB — COMPREHENSIVE METABOLIC PANEL
ALT: 28 U/L (ref 0–44)
AST: 24 U/L (ref 15–41)
Albumin: 3.7 g/dL (ref 3.5–5.0)
Alkaline Phosphatase: 77 U/L (ref 38–126)
Anion gap: 11 (ref 5–15)
BUN: 19 mg/dL (ref 8–23)
CO2: 23 mmol/L (ref 22–32)
Calcium: 9.5 mg/dL (ref 8.9–10.3)
Chloride: 104 mmol/L (ref 98–111)
Creatinine, Ser: 0.87 mg/dL (ref 0.44–1.00)
GFR calc Af Amer: >60 mL/min (ref >60)
GFR calc non Af Amer: >60 mL/min (ref >60)
Glucose, Bld: 104 mg/dL — ABNORMAL HIGH (ref 70–99)
Potassium: 4.3 mmol/L (ref 3.5–5.1)
Sodium: 138 mmol/L (ref 135–145)
Total Bilirubin: 1.1 mg/dL (ref 0.3–1.2)
Total Protein: 6.3 g/dL — ABNORMAL LOW (ref 6.5–8.1)

## 2019-11-10 LAB — PROTIME-INR
INR: 1 (ref 0.8–1.2)
Prothrombin Time: 13.3 seconds (ref 11.4–15.2)

## 2019-11-10 LAB — TSH: TSH: 1.728 u[IU]/mL (ref 0.350–4.500)

## 2019-11-10 LAB — DIFFERENTIAL
Abs Immature Granulocytes: 0.06 10*3/uL (ref 0.00–0.07)
Basophils Absolute: 0.1 10*3/uL (ref 0.0–0.1)
Basophils Relative: 1 %
Eosinophils Absolute: 0.2 10*3/uL (ref 0.0–0.5)
Eosinophils Relative: 2 %
Immature Granulocytes: 1 %
Lymphocytes Relative: 14 %
Lymphs Abs: 1.4 10*3/uL (ref 0.7–4.0)
Monocytes Absolute: 0.8 10*3/uL (ref 0.1–1.0)
Monocytes Relative: 8 %
Neutro Abs: 7.5 10*3/uL (ref 1.7–7.7)
Neutrophils Relative %: 74 %

## 2019-11-10 LAB — RAPID URINE DRUG SCREEN, HOSP PERFORMED
Amphetamines: NOT DETECTED
Barbiturates: NOT DETECTED
Benzodiazepines: NOT DETECTED
Cocaine: NOT DETECTED
Opiates: NOT DETECTED
Tetrahydrocannabinol: NOT DETECTED

## 2019-11-10 LAB — APTT: aPTT: 33 seconds (ref 24–36)

## 2019-11-10 LAB — CBG MONITORING, ED: Glucose-Capillary: 76 mg/dL (ref 70–99)

## 2019-11-10 LAB — SARS CORONAVIRUS 2 (TAT 6-24 HRS): SARS Coronavirus 2: NEGATIVE

## 2019-11-10 LAB — ETHANOL: Alcohol, Ethyl (B): <10 mg/dL (ref <10)

## 2019-11-10 MED ORDER — ASPIRIN EC 81 MG PO TBEC
81.0000 mg | DELAYED_RELEASE_TABLET | Freq: Every day | ORAL | Status: DC
  Administered 2019-11-11 – 2019-11-24 (×14): 81 mg via ORAL
  Filled 2019-11-10 (×14): qty 1

## 2019-11-10 MED ORDER — ONDANSETRON HCL 4 MG PO TABS: 4.0000 mg | ORAL_TABLET | Freq: Four times a day (QID) | ORAL | Status: DC | PRN

## 2019-11-10 MED ORDER — BISACODYL 5 MG PO TBEC: 5.0000 mg | DELAYED_RELEASE_TABLET | Freq: Every day | ORAL | Status: DC | PRN

## 2019-11-10 MED ORDER — ENOXAPARIN SODIUM 40 MG/0.4ML ~~LOC~~ SOLN
40.0000 mg | SUBCUTANEOUS | Status: DC
  Administered 2019-11-10 – 2019-11-24 (×15): 40 mg via SUBCUTANEOUS
  Filled 2019-11-10 (×15): qty 0.4

## 2019-11-10 MED ORDER — SODIUM CHLORIDE 0.9 % IV SOLN: INTRAVENOUS | Status: DC

## 2019-11-10 MED ORDER — STROKE: EARLY STAGES OF RECOVERY BOOK
Freq: Once | Status: AC
  Filled 2019-11-10: qty 1

## 2019-11-10 MED ORDER — MORPHINE SULFATE (PF) 2 MG/ML IV SOLN: 2.0000 mg | INTRAVENOUS | Status: DC | PRN

## 2019-11-10 MED ORDER — ACETAMINOPHEN 325 MG PO TABS: 650.0000 mg | ORAL_TABLET | Freq: Four times a day (QID) | ORAL | Status: DC | PRN

## 2019-11-10 MED ORDER — ASPIRIN 325 MG PO TABS
325.0000 mg | ORAL_TABLET | Freq: Once | ORAL | Status: AC
  Administered 2019-11-10: 325 mg via ORAL
  Filled 2019-11-10: qty 1

## 2019-11-10 MED ORDER — IOHEXOL 350 MG/ML SOLN
50.0000 mL | Freq: Once | INTRAVENOUS | Status: AC | PRN
  Administered 2019-11-10: 50 mL via INTRAVENOUS

## 2019-11-10 MED ORDER — DOCUSATE SODIUM 100 MG PO CAPS
100.0000 mg | ORAL_CAPSULE | Freq: Two times a day (BID) | ORAL | Status: DC
  Administered 2019-11-10 – 2019-11-17 (×9): 100 mg via ORAL
  Filled 2019-11-10 (×13): qty 1

## 2019-11-10 MED ORDER — ONDANSETRON HCL 4 MG/2ML IJ SOLN: 4.0000 mg | Freq: Four times a day (QID) | INTRAMUSCULAR | Status: DC | PRN

## 2019-11-10 MED ORDER — ACETAMINOPHEN 650 MG RE SUPP: 650.0000 mg | Freq: Four times a day (QID) | RECTAL | Status: DC | PRN

## 2019-11-10 MED ORDER — ZOLPIDEM TARTRATE 5 MG PO TABS: 5.0000 mg | ORAL_TABLET | Freq: Every evening | ORAL | Status: DC | PRN

## 2019-11-10 MED ORDER — HYDROCODONE-ACETAMINOPHEN 5-325 MG PO TABS
1.0000 | ORAL_TABLET | ORAL | Status: DC | PRN
  Administered 2019-11-13 (×2): 1 via ORAL
  Filled 2019-11-10 (×2): qty 1

## 2019-11-10 MED ORDER — ATORVASTATIN CALCIUM 40 MG PO TABS
40.0000 mg | ORAL_TABLET | Freq: Every day | ORAL | Status: DC
  Administered 2019-11-10 – 2019-11-24 (×15): 40 mg via ORAL
  Filled 2019-11-10 (×15): qty 1

## 2019-11-10 MED ORDER — POLYETHYLENE GLYCOL 3350 17 G PO PACK: 17.0000 g | PACK | Freq: Every day | ORAL | Status: DC | PRN

## 2019-11-10 NOTE — ED Notes (Signed)
Brother would like an update he is the contact person because  1st contact is out of town  [place]  [phone]

## 2019-11-10 NOTE — ED Notes (Signed)
Brother, jim and mother of pt would like an update [phone]

## 2019-11-10 NOTE — H&P (Addendum)
History and Physical    Lynn [name] [id number] DOB: [birth year] DOA: 11/10/2019  PCP: Marin Olp, MD Consultants:  [place]; [facility] - GI Patient coming from:  Home - lives with 75 yo mother; NOK: Evaristo Bury, [phone]; Mother, [phone]  Chief Complaint: "Slow thinking"  HPI: Lynn [name] is a 74 y.o. female with medical history significant of HTN and depression presenting with "slow thinking."  She reports she had difficulty talking this morning.  Her mother's bedroom is downstairs and she came to the head of the stairs and couldn't figure out how to do this and appeared to have difficulty speaking.  +expressive aphasia.   No dysphagia.  Her left arm is feeling weak.  She had a headache 2 weeks ago.  No h/o prior CVA.  Yesterday was a perfectly normal day.  She was last seen about 8pm.  She was previously healthy and independent until about 2-3 years ago, and has slowed down in the last few years since moving in to her mother's home.   ED Course:  Normal last night about 10pm.  In triage, left-sided arm deficits and slurred speech.  MRI with B CVA.  Neurology requests admission.    Review of Systems: As per HPI; otherwise review of systems reviewed and negative.   Ambulatory Status:  Ambulates with a cane  COVID Vaccine Status:   Complete  Past Medical History:  Diagnosis Date  . Anxiety   . Aortic atherosclerosis ([place])   . Arthritis   . Complication of anesthesia    per pt, slow to wake up past sedation.  . CVA (cerebral vascular accident) ([place]) 11/10/2019  . Depression   . Hyperplastic colon polyp   . Hypertension   . Neuropathy   . Osteoarthritis   . Osteoporosis   . Stomach upset    has a senstive stomach  . Throat clearing    has a cough at times  . Urinary incontinence   . Vitamin D deficiency     Past Surgical History:  Procedure Laterality Date  . COLONOSCOPY    . FACIAL COSMETIC SURGERY     over 15 years ago   . FINGER TENDON REPAIR     middle rt hand-  . HAMMER TOE SURGERY  12/27/2011   Procedure: HAMMER TOE CORRECTION;  Surgeon: Wylene Simmer, MD;  Location: [facility];  Service: Orthopedics;  Laterality: Left;  2-4th    Social History   Socioeconomic History  . Marital status: Divorced    Spouse name: Not on file  . Number of children: 1  . Years of education: 52  . Highest education level: Not on file  Occupational History  . Occupation: retired  Tobacco Use  . Smoking status: Former Smoker    Years: 10.00    Quit date: 12/21/1975    Years since quitting: 43.9  . Smokeless tobacco: Never Used  Substance and Sexual Activity  . Alcohol use: Yes    Comment: rarely  . Drug use: No  . Sexual activity: Not Currently  Other Topics Concern  . Not on file  Social History Narrative   Divorced. Children: 1 son. Patient supports her mother- mother lives with her- 2 story home.       Retired from [employer]  Education: college.       Hobbies: riding horses in the past, family time, enjoys some gardening   Social Determinants of Radio broadcast assistant Strain:   .  Difficulty of Paying Living Expenses:   Food Insecurity:   . Worried About Charity fundraiser in the Last Year:   . Arboriculturist in the Last Year:   Transportation Needs:   . Film/video editor (Medical):   Marland Kitchen Lack of Transportation (Non-Medical):   Physical Activity:   . Days of Exercise per Week:   . Minutes of Exercise per Session:   Stress:   . Feeling of Stress :   Social Connections:   . Frequency of Communication with Friends and Family:   . Frequency of Social Gatherings with Friends and Family:   . Attends Religious Services:   . Active Member of Clubs or Organizations:   . Attends Archivist Meetings:   Marland Kitchen Marital Status:   Intimate Partner Violence:   . Fear of Current or Ex-Partner:   . Emotionally Abused:   Marland Kitchen Physically Abused:   . Sexually Abused:      No Known Allergies  Family History  Problem Relation Age of Onset  . Arthritis Mother   . Colon cancer Father   . COPD Father 87       died at 44  . Throat cancer Brother   . Breast cancer Other   . Esophageal cancer Neg Hx   . Rectal cancer Neg Hx   . Stomach cancer Neg Hx   . Stroke Neg Hx     Prior to Admission medications   Medication Sig Start Date End Date Taking? Authorizing Provider  aspirin 81 MG tablet Take 81 mg by mouth daily.   Yes [provider]  diclofenac (VOLTAREN) 75 MG EC tablet Take 1 tablet (75 mg total) by mouth 2 (two) times daily. 04/27/19  Yes Marin Olp, MD  hydrochlorothiazide (MICROZIDE) 12.5 MG capsule TAKE 1 CAPSULE(12.5 MG) BY MOUTH DAILY Patient not taking: Reported on 11/10/2019 04/27/19   Marin Olp, MD  losartan (COZAAR) 50 MG tablet Take 1 tablet (50 mg total) by mouth daily. Patient not taking: Reported on 11/10/2019 04/27/19   Marin Olp, MD  pantoprazole (PROTONIX) 40 MG tablet Take 1 tablet (40 mg total) by mouth daily. Patient not taking: Reported on 11/10/2019 03/12/19   Jerene Bears, MD  Vitamin D, Ergocalciferol, (DRISDOL) 1.25 MG (50000 UT) CAPS capsule Take 1 capsule (50,000 Units total) by mouth every 7 (seven) days. Patient not taking: Reported on 11/10/2019 04/27/19   Marin Olp, MD    Physical Exam: Vitals:   11/10/19 1159 11/10/19 1200 11/10/19 1201 11/10/19 1300  BP:  (!) 178/86 (!) 178/76 (!) 178/107  Pulse:  63 64 62  Resp:  14 17 15   Temp:   98.9 F (37.2 C)   TempSrc:   Oral   SpO2:  99% 100% 98%  Weight: 58 kg     Height: 5\' 3"  (1.6 m)        . General:  Appears calm and comfortable and is NAD; very flat affect, increased expiratory effort . Eyes:  PERRL, EOMI, normal lids, iris . ENT:  grossly normal hearing, lips & tongue, mmm; appropriate dentition . Neck:  no LAD, masses or thyromegaly; no obvious carotid bruits but loud respiratory effort made this difficult to clearly  ascertain . Cardiovascular:  RRR, no m/r/g. No LE edema.  Marland Kitchen Respiratory:   CTA bilaterally with no wheezes/rales/rhonchi.  Normal respiratory effort. . Abdomen:  soft, NT, ND, NABS . Skin:  no rash or induration seen on limited exam .  Musculoskeletal:  3/5 LUE strength including diminished grip strength; 4/5 decreased plantar flexion of RLE  . Lower extremity:  No LE edema.  Limited foot exam with no ulcerations.  2+ distal pulses. Marland Kitchen Psychiatric:  Very flat mood and affect, speech slowed with mild dysarthria, AOx3 . Neurologic:  L facial droop, sensation intact    Radiological Exams on Admission: CT Code Stroke CTA Head W/WO contrast  Result Date: 11/10/2019 CLINICAL DATA:  Generalized weakness.  Encephalopathy. EXAM: CT ANGIOGRAPHY HEAD AND NECK CT PERFUSION BRAIN TECHNIQUE: Multidetector CT imaging of the head and neck was performed using the standard protocol during bolus administration of intravenous contrast. Multiplanar CT image reconstructions and MIPs were obtained to evaluate the vascular anatomy. Carotid stenosis measurements (when applicable) are obtained utilizing NASCET criteria, using the distal internal carotid diameter as the denominator. Multiphase CT imaging of the brain was performed following IV bolus contrast injection. Subsequent parametric perfusion maps were calculated using RAPID software. CONTRAST:  38mL OMNIPAQUE IOHEXOL 350 MG/ML SOLN COMPARISON:  None. FINDINGS: CTA NECK FINDINGS Aortic arch: Standard 3 vessel aortic arch with minimal atherosclerotic plaque. Widely patent arch vessel origins. Right carotid system: Patent without evidence of stenosis, dissection, or significant atherosclerosis. Left carotid system: Patent without evidence of stenosis, dissection, or significant atherosclerosis. Vertebral arteries: Patent and codominant without evidence of stenosis, dissection, or significant atherosclerosis. Skeleton: Severe facet arthrosis at C3-4 and C4-5 with grade 1  anterolisthesis at both levels. Severe disc degeneration at C5-6 and C6-7. Severe multilevel neural foraminal stenosis in the cervical spine. Other neck: No evidence of cervical lymphadenopathy or mass. Upper chest: No apical lung consolidation or mass. Review of the MIP images confirms the above findings CTA HEAD FINDINGS Anterior circulation: The internal carotid arteries are patent from skull base to carotid termini with minimal nonstenotic plaque bilaterally. ACAs and MCAs are patent without evidence of proximal branch occlusion or significant proximal stenosis. No aneurysm is identified. Posterior circulation: The intracranial vertebral arteries are widely patent to the basilar. Patent PICA, AICA, and SCA origins are identified bilaterally. Posterior communicating arteries are not clearly identified and may be diminutive or absent. The PCAs are patent without evidence of significant proximal stenosis. No aneurysm is identified. Venous sinuses: As permitted by contrast timing, patent. Anatomic variants: None. Review of the MIP images confirms the above findings CT Brain Perfusion Findings: CBF (<30%) Volume: 0 mL Perfusion (Tmax>6.0s) volume: 0 mL Mismatch Volume: n/a Infarction Location: None evident by CTP. IMPRESSION: 1. No large vessel occlusion or significant stenosis in the head and neck. 2. Negative CTP. 3. Advanced cervical disc and facet degeneration. 4.  Aortic Atherosclerosis (ICD10-I70.0). These results were communicated to Dr. Rory Percy at 10:53 am on 11/10/2019 by text page via the [facility] messaging system. Electronically Signed   By: [place] Bores M.D.   On: 11/10/2019 11:05   CT Code Stroke CTA Neck W/WO contrast  Result Date: 11/10/2019 CLINICAL DATA:  Generalized weakness.  Encephalopathy. EXAM: CT ANGIOGRAPHY HEAD AND NECK CT PERFUSION BRAIN TECHNIQUE: Multidetector CT imaging of the head and neck was performed using the standard protocol during bolus administration of intravenous contrast.  Multiplanar CT image reconstructions and MIPs were obtained to evaluate the vascular anatomy. Carotid stenosis measurements (when applicable) are obtained utilizing NASCET criteria, using the distal internal carotid diameter as the denominator. Multiphase CT imaging of the brain was performed following IV bolus contrast injection. Subsequent parametric perfusion maps were calculated using RAPID software. CONTRAST:  80mL OMNIPAQUE IOHEXOL 350 MG/ML SOLN COMPARISON:  None. FINDINGS: CTA NECK FINDINGS Aortic arch: Standard 3 vessel aortic arch with minimal atherosclerotic plaque. Widely patent arch vessel origins. Right carotid system: Patent without evidence of stenosis, dissection, or significant atherosclerosis. Left carotid system: Patent without evidence of stenosis, dissection, or significant atherosclerosis. Vertebral arteries: Patent and codominant without evidence of stenosis, dissection, or significant atherosclerosis. Skeleton: Severe facet arthrosis at C3-4 and C4-5 with grade 1 anterolisthesis at both levels. Severe disc degeneration at C5-6 and C6-7. Severe multilevel neural foraminal stenosis in the cervical spine. Other neck: No evidence of cervical lymphadenopathy or mass. Upper chest: No apical lung consolidation or mass. Review of the MIP images confirms the above findings CTA HEAD FINDINGS Anterior circulation: The internal carotid arteries are patent from skull base to carotid termini with minimal nonstenotic plaque bilaterally. ACAs and MCAs are patent without evidence of proximal branch occlusion or significant proximal stenosis. No aneurysm is identified. Posterior circulation: The intracranial vertebral arteries are widely patent to the basilar. Patent PICA, AICA, and SCA origins are identified bilaterally. Posterior communicating arteries are not clearly identified and may be diminutive or absent. The PCAs are patent without evidence of significant proximal stenosis. No aneurysm is identified.  Venous sinuses: As permitted by contrast timing, patent. Anatomic variants: None. Review of the MIP images confirms the above findings CT Brain Perfusion Findings: CBF (<30%) Volume: 0 mL Perfusion (Tmax>6.0s) volume: 0 mL Mismatch Volume: n/a Infarction Location: None evident by CTP. IMPRESSION: 1. No large vessel occlusion or significant stenosis in the head and neck. 2. Negative CTP. 3. Advanced cervical disc and facet degeneration. 4.  Aortic Atherosclerosis (ICD10-I70.0). These results were communicated to Dr. Rory Percy at 10:53 am on 11/10/2019 by text page via the [facility] messaging system. Electronically Signed   By: [place] Bores M.D.   On: 11/10/2019 11:05   MR BRAIN WO CONTRAST  Result Date: 11/10/2019 CLINICAL DATA:  Generalized weakness. Encephalopathy. EXAM: MRI HEAD WITHOUT CONTRAST TECHNIQUE: Multiplanar, multiecho pulse sequences of the brain and surrounding structures were obtained without intravenous contrast. COMPARISON:  Head CT 11/10/2019 and MRI 02/28/2017 FINDINGS: Brain: There are patchy acute infarcts involving the basal ganglia/corona radiata bilaterally, left larger than right. A lacunar infarct in the posterior limb of the left internal capsule/lateral left thalamus is chronic but new from the prior MRI. T2 hyperintensities elsewhere in the cerebral white matter bilaterally have mildly progressed from the prior MRI and are nonspecific but compatible with mildly age advanced chronic small vessel ischemic disease. No intracranial hemorrhage, mass, midline shift, or extra-axial fluid collection is identified. Mild cerebral atrophy is within normal limits for age. Vascular: Major intracranial vascular flow voids are preserved. Skull and upper cervical spine: Unremarkable bone marrow signal. C3-4 facet arthrosis with grade 1 anterolisthesis. Sinuses/Orbits: Right cataract extraction. Paranasal sinuses and mastoid air cells are clear. Other: None. IMPRESSION: 1. Patchy acute infarcts involving the  left greater than right basal ganglia and corona radiata. 2. Mild chronic small vessel ischemic disease. Electronically Signed   By: [place] Bores M.D.   On: 11/10/2019 11:28   CT Code Stroke Cerebral Perfusion with contrast  Result Date: 11/10/2019 CLINICAL DATA:  Generalized weakness.  Encephalopathy. EXAM: CT ANGIOGRAPHY HEAD AND NECK CT PERFUSION BRAIN TECHNIQUE: Multidetector CT imaging of the head and neck was performed using the standard protocol during bolus administration of intravenous contrast. Multiplanar CT image reconstructions and MIPs were obtained to evaluate the vascular anatomy. Carotid stenosis measurements (when applicable) are obtained utilizing NASCET criteria, using the distal internal carotid  diameter as the denominator. Multiphase CT imaging of the brain was performed following IV bolus contrast injection. Subsequent parametric perfusion maps were calculated using RAPID software. CONTRAST:  53mL OMNIPAQUE IOHEXOL 350 MG/ML SOLN COMPARISON:  None. FINDINGS: CTA NECK FINDINGS Aortic arch: Standard 3 vessel aortic arch with minimal atherosclerotic plaque. Widely patent arch vessel origins. Right carotid system: Patent without evidence of stenosis, dissection, or significant atherosclerosis. Left carotid system: Patent without evidence of stenosis, dissection, or significant atherosclerosis. Vertebral arteries: Patent and codominant without evidence of stenosis, dissection, or significant atherosclerosis. Skeleton: Severe facet arthrosis at C3-4 and C4-5 with grade 1 anterolisthesis at both levels. Severe disc degeneration at C5-6 and C6-7. Severe multilevel neural foraminal stenosis in the cervical spine. Other neck: No evidence of cervical lymphadenopathy or mass. Upper chest: No apical lung consolidation or mass. Review of the MIP images confirms the above findings CTA HEAD FINDINGS Anterior circulation: The internal carotid arteries are patent from skull base to carotid termini with  minimal nonstenotic plaque bilaterally. ACAs and MCAs are patent without evidence of proximal branch occlusion or significant proximal stenosis. No aneurysm is identified. Posterior circulation: The intracranial vertebral arteries are widely patent to the basilar. Patent PICA, AICA, and SCA origins are identified bilaterally. Posterior communicating arteries are not clearly identified and may be diminutive or absent. The PCAs are patent without evidence of significant proximal stenosis. No aneurysm is identified. Venous sinuses: As permitted by contrast timing, patent. Anatomic variants: None. Review of the MIP images confirms the above findings CT Brain Perfusion Findings: CBF (<30%) Volume: 0 mL Perfusion (Tmax>6.0s) volume: 0 mL Mismatch Volume: n/a Infarction Location: None evident by CTP. IMPRESSION: 1. No large vessel occlusion or significant stenosis in the head and neck. 2. Negative CTP. 3. Advanced cervical disc and facet degeneration. 4.  Aortic Atherosclerosis (ICD10-I70.0). These results were communicated to Dr. Rory Percy at 10:53 am on 11/10/2019 by text page via the [facility] messaging system. Electronically Signed   By: Logan Bores M.D.   On: 11/10/2019 11:05   DG Chest Portable 1 View  Result Date: 11/10/2019 CLINICAL DATA:  Altered mental status. EXAM: PORTABLE CHEST 1 VIEW COMPARISON:  12/25/2017 FINDINGS: The heart size and mediastinal contours are within normal limits. Both lungs are clear. The visualized skeletal structures are unremarkable. IMPRESSION: Normal exam. Electronically Signed   By: Lorriane Shire M.D.   On: 11/10/2019 13:22   CT HEAD CODE STROKE WO CONTRAST  Result Date: 11/10/2019 CLINICAL DATA:  Code stroke.  Generalized weakness. EXAM: CT HEAD WITHOUT CONTRAST TECHNIQUE: Contiguous axial images were obtained from the base of the skull through the vertex without intravenous contrast. COMPARISON:  Head MRI 02/28/2017 FINDINGS: Brain: There is no evidence of acute large territory  infarct, intracranial hemorrhage, mass, midline shift, or extra-axial fluid collection. Mild cerebral atrophy is within normal limits for age. Patchy hypodensities in the cerebral white matter bilaterally are nonspecific but compatible with mild to moderate chronic small vessel ischemic disease. Asymmetric, more focal hypodensities are present in the left basal ganglia, left internal capsule, and left corona radiata, new from the prior MRI and compatible with age indeterminate lacunar infarcts. Vascular: Calcified atherosclerosis at the skull base. No hyperdense vessel. Skull: No fracture or suspicious osseous lesion. Sinuses/Orbits: Visualized paranasal sinuses and mastoid air cells are clear. Right cataract extraction is noted. Other: None. ASPECTS [facility] Stroke Program Early CT Score) Not scored due to the lack of localizing symptoms. IMPRESSION: 1. Age indeterminate though possibly recent left basal ganglia region  infarcts. 2. No evidence of acute cortically based infarct or intracranial hemorrhage. 3. Mild-to-moderate chronic small vessel ischemic disease. These findings were discussed via telephone with Dr. Rory Percy at 10:18 am on 11/10/2019. Electronically Signed   By: [place] Bores M.D.   On: 11/10/2019 10:22    EKG: Independently reviewed.  NSR with rate 68; LVH; nonspecific ST changes with no evidence of acute ischemia   Labs on Admission: I have personally reviewed the available labs and imaging studies at the time of the admission.  Pertinent labs:   Glucose 104 Unremarkable CBC ETOH <10   Assessment/Plan Principal Problem:   CVA (cerebral vascular accident) ([place]) Active Problems:   Hypertension   Hyperlipidemia   CVA -Patient with new left-sided weakness and facial droop as well as dysarthria and expressive aphasia starting this AM -Concerning for TIA/CVA -Initial CT and CTA negative; MRI + for B infarcts in basal ganglia and corona radiata -Will admit for further CVA  evaluation -Telemetry monitoring -Echo -Risk stratification with FLP, A1c; will also check TSH and UDS -She was already taking 81 mg ASA daily and so is likely to need DAPT and then transition to Plavix - but will defer to neuro -Neurology consult -PT/OT/ST/Nutrition Consults -She is likely to require CIR vs. SNF -Family has raised the question of dementia and this will need further evaluation as an outpatient  HTN -Allow permissive HTN for now -Treat BP only if >220/120, and then with goal of 15% reduction -She was previously taking HCTZ and Cozaar but has stopped taking them on her own   HLD -Check FLP -Start Lipitor 40 mg daily      Note: This patient has been tested and is pending for the novel coronavirus COVID-19.     DVT prophylaxis:  Lovenox  Code Status: Full - confirmed with patient/family Family Communication: Mother present throughout evaluation; we also had the patient's brother on the telephone for the discussion Disposition Plan:  The patient is from: home  Anticipated d/c is to: CIR vs. SNF.  Anticipated d/c date will depend on clinical response to treatment, but likely 2-3 days  Patient is currently: acutely ill Consults called: Neurology; PT/OT/ST/Nutrition  Admission status: Admit - It is my clinical opinion that admission to [place] is reasonable and necessary because of the expectation that this patient will require hospital care that crosses at least 2 midnights to treat this condition based on the medical complexity of the problems presented.  Given the aforementioned information, the predictability of an adverse outcome is felt to be significant.    Karmen Bongo MD [facility]   How to contact the [facility] Attending or Consulting provider [place] or covering provider during after hours [place], for this patient?  1. Check the care team in [facility] and look for a) attending/consulting TR[facility] provider listed and b) the [facility] team listed 2. Log into  [email or web address] and use [place]'s universal password to access. If you do not have the password, please contact the hospital operator. 3. Locate the [facility] provider you are looking for under [facility] and page to a number that you can be directly reached. 4. If you still have difficulty reaching the provider, please page the [facility] (Director on Call) for the Hospitalists listed on amion for assistance.   11/10/2019, 2:20 PM

## 2019-11-10 NOTE — Consult Note (Addendum)
NEURO HOSPITALIST  CONSULT   Requesting Physician: Dr. Ashok Cordia    Chief Complaint:  Left side weakness/ confusion  History obtained from:  Patient / family  HPI:                                                                                                                                         Lynn [name] is an 74 y.o. female with PMH anxiety, HLD who presented to [facility] ED with weakness and : feeling slower". Code stroke initiated in ED.  Spoke to patient's sister Lynn [name]. She reports that the patient lives with her 50 year old mother, but that the mother is very with it and able to talk and called her this morning after the episode. Lynn [name] stated that for the past year or more the patient has been declining. She is able to take care of herself and perform ADL's but over the past 6 months she has noticed more of a decline. Lynn [name] has not been showering as often, has not cared about her appearance, and she has been walking with a cane stating she felt unsteady walking. Spoke to patients mother Lynn [name] and she confirmed that she last saw her before bed at 10pm last night. This morning she woke up and Lynn [name] was at the top of the stairs rocking back and forth and unable to say momma. EMS was called.  No blood thinners.   ED course:  CTH: no hemorrhage  CTA: no LVO CTP: core 0 penumbra 0  11/09/2019 2200 tPA Given: No: outside of window  Modified Rankin: Rankin Score=1 NIHSS:5   Past Medical History:  Diagnosis Date  . Anxiety   . Aortic atherosclerosis ([place])   . Arthritis   . Complication of anesthesia    per pt, slow to wake up past sedation.  . Depression   . Hyperplastic colon polyp   . Hypertension   . Neuropathy   . Osteoarthritis   . Osteoporosis   . Stomach upset    has a senstive stomach  . Throat clearing    has a cough at times  . Urinary incontinence   . Vitamin D deficiency     Past Surgical  History:  Procedure Laterality Date  . COLONOSCOPY    . FACIAL COSMETIC SURGERY     over 15 years ago  . FINGER TENDON REPAIR     middle rt hand-  . HAMMER TOE SURGERY  12/27/2011   Procedure: HAMMER TOE CORRECTION;  Surgeon: Wylene Simmer, MD;  Location: [place];  Service: Orthopedics;  Laterality: Left;  2-4th    Family History  Problem Relation Age of Onset  . Arthritis Mother   . Colon cancer Father   . COPD Father 25       died at 37  . Throat cancer Brother   . Breast cancer Other   . Esophageal cancer Neg Hx   . Rectal cancer Neg Hx   . Stomach cancer Neg Hx    Social History:  reports that she quit smoking about 43 years ago. She has never used smokeless tobacco. She reports current alcohol use. She reports that she does not use drugs.  Allergies: No Known Allergies  Medications:                                                                                                                           No current facility-administered medications for this encounter.   Current Outpatient Medications  Medication Sig Dispense Refill  . aspirin 81 MG tablet Take 81 mg by mouth daily.    . diclofenac (VOLTAREN) 75 MG EC tablet Take 1 tablet (75 mg total) by mouth 2 (two) times daily. 180 tablet 3  . hydrochlorothiazide (MICROZIDE) 12.5 MG capsule TAKE 1 CAPSULE(12.5 MG) BY MOUTH DAILY 90 capsule 3  . losartan (COZAAR) 50 MG tablet Take 1 tablet (50 mg total) by mouth daily. 90 tablet 3  . pantoprazole (PROTONIX) 40 MG tablet Take 1 tablet (40 mg total) by mouth daily. 90 tablet 1  . Vitamin D, Ergocalciferol, (DRISDOL) 1.25 MG (50000 UT) CAPS capsule Take 1 capsule (50,000 Units total) by mouth every 7 (seven) days. 12 capsule 0     ROS:                                                                                                                                       ROS was performed and is negative except as noted in HPI    General Examination:  Blood pressure (!) 157/75, pulse 63, temperature 99.5 F (37.5 C), resp. rate 14, last menstrual period 07/23/1998, SpO2 95 %.  Physical Exam  Constitutional: Appears well-developed and well-nourished.  Psych: Affect appropriate to situation Eyes: Normal external eye and conjunctiva. HENT: Normocephalic, no lesions, without obvious abnormality.   Musculoskeletal-no joint tenderness, deformity or swelling Cardiovascular: Normal rate and regular rhythm.  Respiratory: Effort normal, non-labored breathing saturations WNL GI: Soft.  No distension. There is no tenderness.  Skin: WDI  Neurological Examination Mental Status: Alert, oriented, speech is slow. Patient stutters at baseline per family but seem hypophonic and mildly dysarthric.   No evidence of aphasia.  Able to follow commands but slow and executing all commands. Cranial Nerves: II:  Visual fields grossly normal,  III,IV, VI: ptosis not present, extra-ocular motions intact bilaterally, pupils equal, round, reactive to light and accommodation V,VII: smile symmetric, facial light touch sensation normal bilaterally VIII: hearing normal bilaterally IX,X: uvula rises midline XI: bilateral shoulder shrug XII: midline tongue extension Motor: Anti-gravity in all 4 extremities and drift in all 4 extremities. Tone and bulk:normal tone throughout; no atrophy noted Sensory:  light touch intact throughout, bilaterally Cerebellar: No ataxia noted Gait: deferred   Lab Results: Basic Metabolic Panel: Recent Labs  Lab 11/10/19 0951  NA 138  K 4.3  CL 104  CO2 23  GLUCOSE 104*  BUN 19  CREATININE 0.87  CALCIUM 9.5    CBC: Recent Labs  Lab 11/10/19 0951  WBC 10.0  NEUTROABS 7.5  HGB 15.7*  HCT 48.2*  MCV 97.2  PLT 315     CBG: Recent Labs  Lab 11/10/19 0950  GLUCAP 76    Imaging: CT Code Stroke CTA Head W/WO  contrast  Result Date: 11/10/2019 CLINICAL DATA:  Generalized weakness.  Encephalopathy. EXAM: CT ANGIOGRAPHY HEAD AND NECK CT PERFUSION BRAIN TECHNIQUE: Multidetector CT imaging of the head and neck was performed using the standard protocol during bolus administration of intravenous contrast. Multiplanar CT image reconstructions and MIPs were obtained to evaluate the vascular anatomy. Carotid stenosis measurements (when applicable) are obtained utilizing NASCET criteria, using the distal internal carotid diameter as the denominator. Multiphase CT imaging of the brain was performed following IV bolus contrast injection. Subsequent parametric perfusion maps were calculated using RAPID software. CONTRAST:  40mL OMNIPAQUE IOHEXOL 350 MG/ML SOLN COMPARISON:  None. FINDINGS: CTA NECK FINDINGS Aortic arch: Standard 3 vessel aortic arch with minimal atherosclerotic plaque. Widely patent arch vessel origins. Right carotid system: Patent without evidence of stenosis, dissection, or significant atherosclerosis. Left carotid system: Patent without evidence of stenosis, dissection, or significant atherosclerosis. Vertebral arteries: Patent and codominant without evidence of stenosis, dissection, or significant atherosclerosis. Skeleton: Severe facet arthrosis at C3-4 and C4-5 with grade 1 anterolisthesis at both levels. Severe disc degeneration at C5-6 and C6-7. Severe multilevel neural foraminal stenosis in the cervical spine. Other neck: No evidence of cervical lymphadenopathy or mass. Upper chest: No apical lung consolidation or mass. Review of the MIP images confirms the above findings CTA HEAD FINDINGS Anterior circulation: The internal carotid arteries are patent from skull base to carotid termini with minimal nonstenotic plaque bilaterally. ACAs and MCAs are patent without evidence of proximal branch occlusion or significant proximal stenosis. No aneurysm is identified. Posterior circulation: The intracranial vertebral  arteries are widely patent to the basilar. Patent PICA, AICA, and SCA origins are identified bilaterally. Posterior communicating arteries are not clearly identified and may be diminutive or absent. The PCAs are patent without evidence of  significant proximal stenosis. No aneurysm is identified. Venous sinuses: As permitted by contrast timing, patent. Anatomic variants: None. Review of the MIP images confirms the above findings CT Brain Perfusion Findings: CBF (<30%) Volume: 0 mL Perfusion (Tmax>6.0s) volume: 0 mL Mismatch Volume: n/a Infarction Location: None evident by CTP. IMPRESSION: 1. No large vessel occlusion or significant stenosis in the head and neck. 2. Negative CTP. 3. Advanced cervical disc and facet degeneration. 4.  Aortic Atherosclerosis (ICD10-I70.0). These results were communicated to Dr. Rory Percy at 10:53 am on 11/10/2019 by text page via the [facility] messaging system. Electronically Signed   By: Logan Bores M.D.   On: 11/10/2019 11:05   CT Code Stroke CTA Neck W/WO contrast  Result Date: 11/10/2019 CLINICAL DATA:  Generalized weakness.  Encephalopathy. EXAM: CT ANGIOGRAPHY HEAD AND NECK CT PERFUSION BRAIN TECHNIQUE: Multidetector CT imaging of the head and neck was performed using the standard protocol during bolus administration of intravenous contrast. Multiplanar CT image reconstructions and MIPs were obtained to evaluate the vascular anatomy. Carotid stenosis measurements (when applicable) are obtained utilizing NASCET criteria, using the distal internal carotid diameter as the denominator. Multiphase CT imaging of the brain was performed following IV bolus contrast injection. Subsequent parametric perfusion maps were calculated using RAPID software. CONTRAST:  59mL OMNIPAQUE IOHEXOL 350 MG/ML SOLN COMPARISON:  None. FINDINGS: CTA NECK FINDINGS Aortic arch: Standard 3 vessel aortic arch with minimal atherosclerotic plaque. Widely patent arch vessel origins. Right carotid system: Patent without  evidence of stenosis, dissection, or significant atherosclerosis. Left carotid system: Patent without evidence of stenosis, dissection, or significant atherosclerosis. Vertebral arteries: Patent and codominant without evidence of stenosis, dissection, or significant atherosclerosis. Skeleton: Severe facet arthrosis at C3-4 and C4-5 with grade 1 anterolisthesis at both levels. Severe disc degeneration at C5-6 and C6-7. Severe multilevel neural foraminal stenosis in the cervical spine. Other neck: No evidence of cervical lymphadenopathy or mass. Upper chest: No apical lung consolidation or mass. Review of the MIP images confirms the above findings CTA HEAD FINDINGS Anterior circulation: The internal carotid arteries are patent from skull base to carotid termini with minimal nonstenotic plaque bilaterally. ACAs and MCAs are patent without evidence of proximal branch occlusion or significant proximal stenosis. No aneurysm is identified. Posterior circulation: The intracranial vertebral arteries are widely patent to the basilar. Patent PICA, AICA, and SCA origins are identified bilaterally. Posterior communicating arteries are not clearly identified and may be diminutive or absent. The PCAs are patent without evidence of significant proximal stenosis. No aneurysm is identified. Venous sinuses: As permitted by contrast timing, patent. Anatomic variants: None. Review of the MIP images confirms the above findings CT Brain Perfusion Findings: CBF (<30%) Volume: 0 mL Perfusion (Tmax>6.0s) volume: 0 mL Mismatch Volume: n/a Infarction Location: None evident by CTP. IMPRESSION: 1. No large vessel occlusion or significant stenosis in the head and neck. 2. Negative CTP. 3. Advanced cervical disc and facet degeneration. 4.  Aortic Atherosclerosis (ICD10-I70.0). These results were communicated to Dr. Rory Percy at 10:53 am on 11/10/2019 by text page via the [facility] messaging system. Electronically Signed   By: Logan Bores M.D.   On:  11/10/2019 11:05   CT Code Stroke Cerebral Perfusion with contrast  Result Date: 11/10/2019 CLINICAL DATA:  Generalized weakness.  Encephalopathy. EXAM: CT ANGIOGRAPHY HEAD AND NECK CT PERFUSION BRAIN TECHNIQUE: Multidetector CT imaging of the head and neck was performed using the standard protocol during bolus administration of intravenous contrast. Multiplanar CT image reconstructions and MIPs were obtained to evaluate the  vascular anatomy. Carotid stenosis measurements (when applicable) are obtained utilizing NASCET criteria, using the distal internal carotid diameter as the denominator. Multiphase CT imaging of the brain was performed following IV bolus contrast injection. Subsequent parametric perfusion maps were calculated using RAPID software. CONTRAST:  20mL OMNIPAQUE IOHEXOL 350 MG/ML SOLN COMPARISON:  None. FINDINGS: CTA NECK FINDINGS Aortic arch: Standard 3 vessel aortic arch with minimal atherosclerotic plaque. Widely patent arch vessel origins. Right carotid system: Patent without evidence of stenosis, dissection, or significant atherosclerosis. Left carotid system: Patent without evidence of stenosis, dissection, or significant atherosclerosis. Vertebral arteries: Patent and codominant without evidence of stenosis, dissection, or significant atherosclerosis. Skeleton: Severe facet arthrosis at C3-4 and C4-5 with grade 1 anterolisthesis at both levels. Severe disc degeneration at C5-6 and C6-7. Severe multilevel neural foraminal stenosis in the cervical spine. Other neck: No evidence of cervical lymphadenopathy or mass. Upper chest: No apical lung consolidation or mass. Review of the MIP images confirms the above findings CTA HEAD FINDINGS Anterior circulation: The internal carotid arteries are patent from skull base to carotid termini with minimal nonstenotic plaque bilaterally. ACAs and MCAs are patent without evidence of proximal branch occlusion or significant proximal stenosis. No aneurysm is  identified. Posterior circulation: The intracranial vertebral arteries are widely patent to the basilar. Patent PICA, AICA, and SCA origins are identified bilaterally. Posterior communicating arteries are not clearly identified and may be diminutive or absent. The PCAs are patent without evidence of significant proximal stenosis. No aneurysm is identified. Venous sinuses: As permitted by contrast timing, patent. Anatomic variants: None. Review of the MIP images confirms the above findings CT Brain Perfusion Findings: CBF (<30%) Volume: 0 mL Perfusion (Tmax>6.0s) volume: 0 mL Mismatch Volume: n/a Infarction Location: None evident by CTP. IMPRESSION: 1. No large vessel occlusion or significant stenosis in the head and neck. 2. Negative CTP. 3. Advanced cervical disc and facet degeneration. 4.  Aortic Atherosclerosis (ICD10-I70.0). These results were communicated to Dr. Rory Percy at 10:53 am on 11/10/2019 by text page via the [facility] messaging system. Electronically Signed   By: Logan Bores M.D.   On: 11/10/2019 11:05   CT HEAD CODE STROKE WO CONTRAST  Result Date: 11/10/2019 CLINICAL DATA:  Code stroke.  Generalized weakness. EXAM: CT HEAD WITHOUT CONTRAST TECHNIQUE: Contiguous axial images were obtained from the base of the skull through the vertex without intravenous contrast. COMPARISON:  Head MRI 02/28/2017 FINDINGS: Brain: There is no evidence of acute large territory infarct, intracranial hemorrhage, mass, midline shift, or extra-axial fluid collection. Mild cerebral atrophy is within normal limits for age. Patchy hypodensities in the cerebral white matter bilaterally are nonspecific but compatible with mild to moderate chronic small vessel ischemic disease. Asymmetric, more focal hypodensities are present in the left basal ganglia, left internal capsule, and left corona radiata, new from the prior MRI and compatible with age indeterminate lacunar infarcts. Vascular: Calcified atherosclerosis at the skull base. No  hyperdense vessel. Skull: No fracture or suspicious osseous lesion. Sinuses/Orbits: Visualized paranasal sinuses and mastoid air cells are clear. Right cataract extraction is noted. Other: None. ASPECTS [facility] Stroke Program Early CT Score) Not scored due to the lack of localizing symptoms. IMPRESSION: 1. Age indeterminate though possibly recent left basal ganglia region infarcts. 2. No evidence of acute cortically based infarct or intracranial hemorrhage. 3. Mild-to-moderate chronic small vessel ischemic disease. These findings were discussed via telephone with Dr. Rory Percy at 10:18 am on 11/10/2019. Electronically Signed   By: Logan Bores M.D.   On: 11/10/2019  10:22       Laurey Morale, MSN, NP-C [facility] [phone]  11/10/2019, 10:14 AM    Assessment: 74 y.o. female with PMH anxiety and HTN who presented to generalized weakness and thinking slow. Code stroke initiated in ED. The [facility] was negative for a hemorrhage. Patient was not a TPA candidate d/t LSW of 2200 11/09/19. The CTA did not show an LVO and thus patient is not a candidate for IR.  Her exam was consistent with bilateral upper and lower extremities drift, and thus she was taken for a stat MRI to try to get more information. MRI did reveal  Bilateral infarcts in basal ganglia and corona radiata.  Atypical for embolic infarcts, likely location more favorable for small vessel disease but bilateral hemispheric involvement all at the same time is rather less usual.  It could be concomitant small vessel disease.  Would recommend admission for stroke risk factor work-up.   Stroke Risk Factors - hypertension   Impression: -Acute ischemic stroke-concomitant small vessel disease versus cardioembolic. -Ongoing cognitive decline-needs outpatient follow-up for memory disorder/cognitive deficits.    Recommendations: -- BP goal : Permissive HTN upto 220/110 mmHg for 24 hours --Echocardiogram -- Prophylactic  therapy-Antiplatelet med-aspirin 325 now and daily -- High intensity Statin if LDL > 70 -- HgbA1c, fasting lipid panel -- PT consult, OT consult, Speech consult --Telemetry monitoring --Frequent neuro checks --Stroke swallow screen --Formal neurocognitive testing as an outpatient  --Please page the Stroke team from 8am-4pm.   You can look them up on [email or web address]     Attending Neurohospitalist Addendum Patient seen and examined with APP/Resident. Agree with the history and physical as documented above. Agree with the plan as documented, which I helped formulate. I have independently reviewed the chart, obtained history, review of systems and examined the patient.I have personally reviewed pertinent head/neck/spine imaging (CT/MRI). Please feel free to call with any questions. --- Amie Portland, MD [facility] Pager: [phone]  If 7pm to 7am, please call on call as listed on AMION.

## 2019-11-10 NOTE — Code Documentation (Signed)
Stroke Response Nurse Documentation Code Documentation  Lynn [name] is a 74 y.o. female arriving to [facility] ED via [facility] on 11/10/19 with past medical hx of hyperlipidemia. Code stroke was activated by ED when noted left arm weakness, facial droop and slurred speech. Patient from home where she was LKW at 11/09/19 2200 and now complaining of weakness and "thinking feels slower".   Labs drawn and patient cleared for CT by EDP. Stroke team met pt in CT. NIHSS 5, see documentation for details and code stroke times. Patient with bilateral arm weakness, bilateral leg weakness and dysarthria  on exam. The following imaging was completed: CT, CTA head and neck, CTP & MRI. Patient is not a candidate for tPA due to out of window. Care/Plan Q2h VS & mNIHSS. Bedside handoff with ED RN.    Leverne Humbles Stroke Response RN

## 2019-11-10 NOTE — ED Triage Notes (Signed)
Pt arrives via EMS from home with generalized weakness starting today. Pt reports that her thinking feels slow. Negative stroke scale for EMS, LSN 10 pm last night. Upon assessment, facial droop, left arm weakness, slurred speech noticed. Code stroke activated.

## 2019-11-10 NOTE — ED Provider Notes (Addendum)
[facility] EMERGENCY DEPARTMENT Provider Note   CSN: [id number] Arrival date & time: 11/10/19  [id number]  An emergency department physician performed an initial assessment on this suspected stroke patient at 0958.  History Chief Complaint  Patient presents with  . Weakness    Lynn [name] is a 74 y.o. female.  74 y.o female with a PMH of Anxiety, HTN. Hyperlipidemia presents to the ED via EMS with a chief complaint of weakness.She states " thinking feels slower". According to EMS reports patient was negative for stroke scale. She was evaluated by me in triage found to have left arm weakness along with slight facial droop and slurred speech. Code stroke activated.   The history is provided by the patient and the EMS personnel.  Weakness Associated symptoms: no chest pain, no fever and no shortness of breath        Past Medical History:  Diagnosis Date  . Anxiety   . Aortic atherosclerosis ([place])   . Arthritis   . Complication of anesthesia    per pt, slow to wake up past sedation.  . Depression   . Hyperplastic colon polyp   . Hypertension   . Neuropathy   . Osteoarthritis   . Osteoporosis   . Stomach upset    has a senstive stomach  . Throat clearing    has a cough at times  . Urinary incontinence   . Vitamin D deficiency     Patient Active Problem List   Diagnosis Date Noted  . Senile purpura ([place]) 04/27/2019  . Prolapsed uterus 04/27/2019  . Aortic atherosclerosis ([place]) 06/26/2017  . Neuropathy 05/20/2017  . Abnormality of gait due to impairment of balance 05/20/2017  . Incontinence in female 06/25/2014  . Hearing deficit 06/25/2014  . Osteoporosis 06/24/2013  . Hypertension 05/11/2013  . Osteoarthritis 05/11/2013  . Vitamin D deficiency 05/11/2013  . Personal history of colonic polyps 05/11/2013  . Hyperlipidemia 05/11/2013    Past Surgical History:  Procedure Laterality Date  . COLONOSCOPY    . FACIAL COSMETIC SURGERY      over 15 years ago  . FINGER TENDON REPAIR     middle rt hand-  . HAMMER TOE SURGERY  12/27/2011   Procedure: HAMMER TOE CORRECTION;  Surgeon: Wylene Simmer, MD;  Location: [facility];  Service: Orthopedics;  Laterality: Left;  2-4th     OB History    Gravida  1   Para  1   Term  1   Preterm      AB      Living  1     SAB      TAB      Ectopic      Multiple      Live Births              Family History  Problem Relation Age of Onset  . Arthritis Mother   . Colon cancer Father   . COPD Father 88       died at 25  . Throat cancer Brother   . Breast cancer Other   . Esophageal cancer Neg Hx   . Rectal cancer Neg Hx   . Stomach cancer Neg Hx     Social History   Tobacco Use  . Smoking status: Former Smoker    Quit date: 12/21/1975    Years since quitting: 43.9  . Smokeless tobacco: Never Used  Substance Use Topics  . Alcohol use: Yes  Comment: rarely  . Drug use: No    Home Medications Prior to Admission medications   Medication Sig Start Date End Date Taking? Authorizing Provider  aspirin 81 MG tablet Take 81 mg by mouth daily.   Yes [provider]  diclofenac (VOLTAREN) 75 MG EC tablet Take 1 tablet (75 mg total) by mouth 2 (two) times daily. 04/27/19  Yes Marin Olp, MD  hydrochlorothiazide (MICROZIDE) 12.5 MG capsule TAKE 1 CAPSULE(12.5 MG) BY MOUTH DAILY Patient not taking: Reported on 11/10/2019 04/27/19   Marin Olp, MD  losartan (COZAAR) 50 MG tablet Take 1 tablet (50 mg total) by mouth daily. Patient not taking: Reported on 11/10/2019 04/27/19   Marin Olp, MD  pantoprazole (PROTONIX) 40 MG tablet Take 1 tablet (40 mg total) by mouth daily. Patient not taking: Reported on 11/10/2019 03/12/19   Jerene Bears, MD  Vitamin D, Ergocalciferol, (DRISDOL) 1.25 MG (50000 UT) CAPS capsule Take 1 capsule (50,000 Units total) by mouth every 7 (seven) days. Patient not taking: Reported on 11/10/2019 04/27/19   Marin Olp, MD    Allergies    Patient has no known allergies.  Review of Systems   Review of Systems  Unable to perform ROS: Acuity of condition  Constitutional: Negative for fever.  Respiratory: Negative for shortness of breath.   Cardiovascular: Negative for chest pain.  Neurological: Positive for weakness.    Physical Exam Updated Vital Signs BP (!) 178/76 (BP Location: Left Arm)   Pulse 64   Temp 98.9 F (37.2 C) (Oral)   Resp 17   Ht 5\' 3"  (1.6 m)   Wt 58 kg   LMP 07/23/1998 (Approximate)   SpO2 100%   BMI 22.65 kg/m   Physical Exam Vitals and nursing note reviewed.  Constitutional:      Appearance: Normal appearance.  HENT:     Head: Normocephalic and atraumatic.     Nose: Nose normal.     Mouth/Throat:     Mouth: Mucous membranes are moist.  Cardiovascular:     Rate and Rhythm: Normal rate.     Pulses:          Dorsalis pedis pulses are 2+ on the right side and 2+ on the left side.       Posterior tibial pulses are 2+ on the right side and 2+ on the left side.     Heart sounds: No murmur.  Pulmonary:     Effort: Pulmonary effort is normal.     Breath sounds: No wheezing or rales.  Abdominal:     General: Abdomen is flat.     Tenderness: There is no abdominal tenderness. There is no right CVA tenderness or left CVA tenderness.  Skin:    General: Skin is warm and dry.  Neurological:     Mental Status: She is alert. She is disoriented.     Cranial Nerves: Cranial nerve deficit and dysarthria present.     Sensory: Sensation is intact.     Motor: Weakness present.     Gait: Gait abnormal.     Comments: No facial asymmetry. Mild slurred speech. LUE weakness with flexion and extension.  RUE 5/5 strength  No tremor noted.  Patient is AOX2.  Gait unsteady when transferring from wc to ct scan.       ED Results / Procedures / Treatments   Labs (all labs ordered are listed, but only abnormal results are displayed) Labs Reviewed  CBC - Abnormal;  Notable for the following components:      Result Value   Hemoglobin 15.7 (*)    HCT 48.2 (*)    All other components within normal limits  COMPREHENSIVE METABOLIC PANEL - Abnormal; Notable for the following components:   Glucose, Bld 104 (*)    Total Protein 6.3 (*)    All other components within normal limits  SARS CORONAVIRUS 2 (TAT 6-24 HRS)  ETHANOL  DIFFERENTIAL  PROTIME-INR  APTT  RAPID URINE DRUG SCREEN, HOSP PERFORMED  URINALYSIS, ROUTINE W REFLEX MICROSCOPIC  I-STAT CHEM 8, ED  CBG MONITORING, ED    EKG EKG Interpretation  Date/Time:  Tuesday November 10 2019 09:47:37 EDT Ventricular Rate:  68 PR Interval:  176 QRS Duration: 88 QT Interval:  390 QTC Calculation: 414 R Axis:   -50 Text Interpretation: Normal sinus rhythm Left axis deviation Left ventricular hypertrophy Nonspecific T wave abnormality Confirmed by Lajean Saver [phone]) on 11/10/2019 11:05:28 AM   Radiology CT Code Stroke CTA Head W/WO contrast  Result Date: 11/10/2019 CLINICAL DATA:  Generalized weakness.  Encephalopathy. EXAM: CT ANGIOGRAPHY HEAD AND NECK CT PERFUSION BRAIN TECHNIQUE: Multidetector CT imaging of the head and neck was performed using the standard protocol during bolus administration of intravenous contrast. Multiplanar CT image reconstructions and MIPs were obtained to evaluate the vascular anatomy. Carotid stenosis measurements (when applicable) are obtained utilizing NASCET criteria, using the distal internal carotid diameter as the denominator. Multiphase CT imaging of the brain was performed following IV bolus contrast injection. Subsequent parametric perfusion maps were calculated using RAPID software. CONTRAST:  64mL OMNIPAQUE IOHEXOL 350 MG/ML SOLN COMPARISON:  None. FINDINGS: CTA NECK FINDINGS Aortic arch: Standard 3 vessel aortic arch with minimal atherosclerotic plaque. Widely patent arch vessel origins. Right carotid system: Patent without evidence of stenosis, dissection, or  significant atherosclerosis. Left carotid system: Patent without evidence of stenosis, dissection, or significant atherosclerosis. Vertebral arteries: Patent and codominant without evidence of stenosis, dissection, or significant atherosclerosis. Skeleton: Severe facet arthrosis at C3-4 and C4-5 with grade 1 anterolisthesis at both levels. Severe disc degeneration at C5-6 and C6-7. Severe multilevel neural foraminal stenosis in the cervical spine. Other neck: No evidence of cervical lymphadenopathy or mass. Upper chest: No apical lung consolidation or mass. Review of the MIP images confirms the above findings CTA HEAD FINDINGS Anterior circulation: The internal carotid arteries are patent from skull base to carotid termini with minimal nonstenotic plaque bilaterally. ACAs and MCAs are patent without evidence of proximal branch occlusion or significant proximal stenosis. No aneurysm is identified. Posterior circulation: The intracranial vertebral arteries are widely patent to the basilar. Patent PICA, AICA, and SCA origins are identified bilaterally. Posterior communicating arteries are not clearly identified and may be diminutive or absent. The PCAs are patent without evidence of significant proximal stenosis. No aneurysm is identified. Venous sinuses: As permitted by contrast timing, patent. Anatomic variants: None. Review of the MIP images confirms the above findings CT Brain Perfusion Findings: CBF (<30%) Volume: 0 mL Perfusion (Tmax>6.0s) volume: 0 mL Mismatch Volume: n/a Infarction Location: None evident by CTP. IMPRESSION: 1. No large vessel occlusion or significant stenosis in the head and neck. 2. Negative CTP. 3. Advanced cervical disc and facet degeneration. 4.  Aortic Atherosclerosis (ICD10-I70.0). These results were communicated to Dr. Rory Percy at 10:53 am on 11/10/2019 by text page via the [facility] messaging system. Electronically Signed   By: Logan Bores M.D.   On: 11/10/2019 11:05   CT Code Stroke CTA  Neck W/WO contrast  Result Date: 11/10/2019 CLINICAL DATA:  Generalized weakness.  Encephalopathy. EXAM: CT ANGIOGRAPHY HEAD AND NECK CT PERFUSION BRAIN TECHNIQUE: Multidetector CT imaging of the head and neck was performed using the standard protocol during bolus administration of intravenous contrast. Multiplanar CT image reconstructions and MIPs were obtained to evaluate the vascular anatomy. Carotid stenosis measurements (when applicable) are obtained utilizing NASCET criteria, using the distal internal carotid diameter as the denominator. Multiphase CT imaging of the brain was performed following IV bolus contrast injection. Subsequent parametric perfusion maps were calculated using RAPID software. CONTRAST:  37mL OMNIPAQUE IOHEXOL 350 MG/ML SOLN COMPARISON:  None. FINDINGS: CTA NECK FINDINGS Aortic arch: Standard 3 vessel aortic arch with minimal atherosclerotic plaque. Widely patent arch vessel origins. Right carotid system: Patent without evidence of stenosis, dissection, or significant atherosclerosis. Left carotid system: Patent without evidence of stenosis, dissection, or significant atherosclerosis. Vertebral arteries: Patent and codominant without evidence of stenosis, dissection, or significant atherosclerosis. Skeleton: Severe facet arthrosis at C3-4 and C4-5 with grade 1 anterolisthesis at both levels. Severe disc degeneration at C5-6 and C6-7. Severe multilevel neural foraminal stenosis in the cervical spine. Other neck: No evidence of cervical lymphadenopathy or mass. Upper chest: No apical lung consolidation or mass. Review of the MIP images confirms the above findings CTA HEAD FINDINGS Anterior circulation: The internal carotid arteries are patent from skull base to carotid termini with minimal nonstenotic plaque bilaterally. ACAs and MCAs are patent without evidence of proximal branch occlusion or significant proximal stenosis. No aneurysm is identified. Posterior circulation: The intracranial  vertebral arteries are widely patent to the basilar. Patent PICA, AICA, and SCA origins are identified bilaterally. Posterior communicating arteries are not clearly identified and may be diminutive or absent. The PCAs are patent without evidence of significant proximal stenosis. No aneurysm is identified. Venous sinuses: As permitted by contrast timing, patent. Anatomic variants: None. Review of the MIP images confirms the above findings CT Brain Perfusion Findings: CBF (<30%) Volume: 0 mL Perfusion (Tmax>6.0s) volume: 0 mL Mismatch Volume: n/a Infarction Location: None evident by CTP. IMPRESSION: 1. No large vessel occlusion or significant stenosis in the head and neck. 2. Negative CTP. 3. Advanced cervical disc and facet degeneration. 4.  Aortic Atherosclerosis (ICD10-I70.0). These results were communicated to Dr. Rory Percy at 10:53 am on 11/10/2019 by text page via the [facility] messaging system. Electronically Signed   By: Logan Bores M.D.   On: 11/10/2019 11:05   MR BRAIN WO CONTRAST  Result Date: 11/10/2019 CLINICAL DATA:  Generalized weakness. Encephalopathy. EXAM: MRI HEAD WITHOUT CONTRAST TECHNIQUE: Multiplanar, multiecho pulse sequences of the brain and surrounding structures were obtained without intravenous contrast. COMPARISON:  Head CT 11/10/2019 and MRI 02/28/2017 FINDINGS: Brain: There are patchy acute infarcts involving the basal ganglia/corona radiata bilaterally, left larger than right. A lacunar infarct in the posterior limb of the left internal capsule/lateral left thalamus is chronic but new from the prior MRI. T2 hyperintensities elsewhere in the cerebral white matter bilaterally have mildly progressed from the prior MRI and are nonspecific but compatible with mildly age advanced chronic small vessel ischemic disease. No intracranial hemorrhage, mass, midline shift, or extra-axial fluid collection is identified. Mild cerebral atrophy is within normal limits for age. Vascular: Major intracranial  vascular flow voids are preserved. Skull and upper cervical spine: Unremarkable bone marrow signal. C3-4 facet arthrosis with grade 1 anterolisthesis. Sinuses/Orbits: Right cataract extraction. Paranasal sinuses and mastoid air cells are clear. Other: None. IMPRESSION: 1. Patchy acute infarcts involving the left greater than right  basal ganglia and corona radiata. 2. Mild chronic small vessel ischemic disease. Electronically Signed   By: Logan Bores M.D.   On: 11/10/2019 11:28   CT Code Stroke Cerebral Perfusion with contrast  Result Date: 11/10/2019 CLINICAL DATA:  Generalized weakness.  Encephalopathy. EXAM: CT ANGIOGRAPHY HEAD AND NECK CT PERFUSION BRAIN TECHNIQUE: Multidetector CT imaging of the head and neck was performed using the standard protocol during bolus administration of intravenous contrast. Multiplanar CT image reconstructions and MIPs were obtained to evaluate the vascular anatomy. Carotid stenosis measurements (when applicable) are obtained utilizing NASCET criteria, using the distal internal carotid diameter as the denominator. Multiphase CT imaging of the brain was performed following IV bolus contrast injection. Subsequent parametric perfusion maps were calculated using RAPID software. CONTRAST:  48mL OMNIPAQUE IOHEXOL 350 MG/ML SOLN COMPARISON:  None. FINDINGS: CTA NECK FINDINGS Aortic arch: Standard 3 vessel aortic arch with minimal atherosclerotic plaque. Widely patent arch vessel origins. Right carotid system: Patent without evidence of stenosis, dissection, or significant atherosclerosis. Left carotid system: Patent without evidence of stenosis, dissection, or significant atherosclerosis. Vertebral arteries: Patent and codominant without evidence of stenosis, dissection, or significant atherosclerosis. Skeleton: Severe facet arthrosis at C3-4 and C4-5 with grade 1 anterolisthesis at both levels. Severe disc degeneration at C5-6 and C6-7. Severe multilevel neural foraminal stenosis in  the cervical spine. Other neck: No evidence of cervical lymphadenopathy or mass. Upper chest: No apical lung consolidation or mass. Review of the MIP images confirms the above findings CTA HEAD FINDINGS Anterior circulation: The internal carotid arteries are patent from skull base to carotid termini with minimal nonstenotic plaque bilaterally. ACAs and MCAs are patent without evidence of proximal branch occlusion or significant proximal stenosis. No aneurysm is identified. Posterior circulation: The intracranial vertebral arteries are widely patent to the basilar. Patent PICA, AICA, and SCA origins are identified bilaterally. Posterior communicating arteries are not clearly identified and may be diminutive or absent. The PCAs are patent without evidence of significant proximal stenosis. No aneurysm is identified. Venous sinuses: As permitted by contrast timing, patent. Anatomic variants: None. Review of the MIP images confirms the above findings CT Brain Perfusion Findings: CBF (<30%) Volume: 0 mL Perfusion (Tmax>6.0s) volume: 0 mL Mismatch Volume: n/a Infarction Location: None evident by CTP. IMPRESSION: 1. No large vessel occlusion or significant stenosis in the head and neck. 2. Negative CTP. 3. Advanced cervical disc and facet degeneration. 4.  Aortic Atherosclerosis (ICD10-I70.0). These results were communicated to Dr. Rory Percy at 10:53 am on 11/10/2019 by text page via the [facility] messaging system. Electronically Signed   By: Logan Bores M.D.   On: 11/10/2019 11:05   CT HEAD CODE STROKE WO CONTRAST  Result Date: 11/10/2019 CLINICAL DATA:  Code stroke.  Generalized weakness. EXAM: CT HEAD WITHOUT CONTRAST TECHNIQUE: Contiguous axial images were obtained from the base of the skull through the vertex without intravenous contrast. COMPARISON:  Head MRI 02/28/2017 FINDINGS: Brain: There is no evidence of acute large territory infarct, intracranial hemorrhage, mass, midline shift, or extra-axial fluid collection.  Mild cerebral atrophy is within normal limits for age. Patchy hypodensities in the cerebral white matter bilaterally are nonspecific but compatible with mild to moderate chronic small vessel ischemic disease. Asymmetric, more focal hypodensities are present in the left basal ganglia, left internal capsule, and left corona radiata, new from the prior MRI and compatible with age indeterminate lacunar infarcts. Vascular: Calcified atherosclerosis at the skull base. No hyperdense vessel. Skull: No fracture or suspicious osseous lesion. Sinuses/Orbits: Visualized paranasal  sinuses and mastoid air cells are clear. Right cataract extraction is noted. Other: None. ASPECTS [facility] Stroke Program Early CT Score) Not scored due to the lack of localizing symptoms. IMPRESSION: 1. Age indeterminate though possibly recent left basal ganglia region infarcts. 2. No evidence of acute cortically based infarct or intracranial hemorrhage. 3. Mild-to-moderate chronic small vessel ischemic disease. These findings were discussed via telephone with Dr. Rory Percy at 10:18 am on 11/10/2019. Electronically Signed   By: Logan Bores M.D.   On: 11/10/2019 10:22    Procedures .Critical Care Performed by: Janeece Fitting, PA-C Authorized by: Janeece Fitting, PA-C   Critical care provider statement:    Critical care time (minutes):  45   Critical care start time:  11/10/2019 11:30 AM   Critical care end time:  11/10/2019 12:15 PM   Critical care was necessary to treat or prevent imminent or life-threatening deterioration of the following conditions:  CNS failure or compromise   Critical care was time spent personally by me on the following activities:  Discussions with consultants, ordering and performing treatments and interventions, review of old charts, examination of patient and obtaining history from patient or surrogate   (including critical care time)  Medications Ordered in ED Medications  iohexol (OMNIPAQUE) 350 MG/ML injection 50  mL (50 mLs Intravenous Contrast Given 11/10/19 1037)  iohexol (OMNIPAQUE) 350 MG/ML injection 50 mL (50 mLs Intravenous Contrast Given 11/10/19 1047)    ED Course  I have reviewed the triage vital signs and the nursing notes.  Pertinent labs & imaging results that were available during my care of the patient were reviewed by me and considered in my medical decision making (see chart for details).    MDM Rules/Calculators/A&P                      Patient arrived in triage via EMS for generalized weakness since this morning.  According to EMS report patient was negative for any stroke deficit.  She was found to have slurred speech, left-sided weakness on my exam and by nursing staff.  Code stroke was immediately activated.  According to EMS report patient was last seen normal last night.  Will obtain collateral information from family. During evaluation patient has left upper extremity weakness with hand flexion and extension.  Unsteady gait noted when transferred onto CT scanning machine.  Mild slurred speech on my encounter.  Vitals within normal limits, blood pressure slightly elevated, is afebrile, no signs of respiratory distress.  Lungs are clear to auscultation, abdomen is soft nontender to palpation.  10:49 AM  Spoke to family sister Lynn [name], neuro decline for 2 years according to her in the last 6 months symptoms have worsen. Mother found her at the top of the stairs, feeling like she couldn't come down. Behavior was out of the ordinary along with speech was differently. No hx of alcohol use or illicit drug use.  Mother (84) Lynn [name] phone [phone] (lives with patient) Lynn [name] [phone]  11:30 AM spoke to mother Lynn [name] at the bedside, she reports patient was standing on top of the staircase this morning, was shifting left to right, was not making sense in her speech.  This was out of the ordinary behavior along with speech for patient.  She was last seen normal  yesterday after the night news, around 10 PM.  Interpretation of her labs with a CBC without any leukocytosis, no signs of anemia.  CMP without any electrolyte derangement, creatinine level  is within normal limits.  LFTs are unremarkable.  Ethanol level is normal.  UA is currently pending.  EKG is normal sinus rhythm, without any changes consistent with infarct.  CT HEAD: 1. Age indeterminate though possibly recent left basal ganglia  region infarcts.  2. No evidence of acute cortically based infarct or intracranial  hemorrhage.  3. Mild-to-moderate chronic small vessel ischemic disease.    These findings were discussed via telephone with Dr. Rory Percy at 10:18  am on 11/10/2019.     MRI Brain: 1. Patchy acute infarcts involving the left greater than right basal  ganglia and corona radiata.  2. Mild chronic small vessel ischemic disease.      Family at the bedside was informed on results.  Spoke to Dr. Malen Gauze of neurology who recommended hospitalist admission for further work-up.  12:45 PM Spoke to Dr. Lorin Mercy of hospitalist who will admit patient for further workup. Appreciate her help.    Portions of this note were generated with [employer]. Dictation errors may occur despite best attempts at proofreading.  Final Clinical Impression(s) / ED Diagnoses Final diagnoses:  Weakness    Rx / DC Orders ED Discharge Orders    None       Janeece Fitting, PA-C 11/10/19 [place], Deyonte Cadden, PA-C 11/10/19 1307    Lajean Saver, MD 11/10/19 [phone]

## 2019-11-11 ENCOUNTER — Inpatient Hospital Stay (HOSPITAL_COMMUNITY): Payer: Medicare Other

## 2019-11-11 DIAGNOSIS — I6389 Other cerebral infarction: Secondary | ICD-10-CM

## 2019-11-11 LAB — LIPID PANEL
Cholesterol: 196 mg/dL (ref 0–200)
HDL: 65 mg/dL (ref >40)
LDL Cholesterol: 115 mg/dL — ABNORMAL HIGH (ref 0–99)
Total CHOL/HDL Ratio: 3 RATIO
Triglycerides: 78 mg/dL (ref <150)
VLDL: 16 mg/dL (ref 0–40)

## 2019-11-11 LAB — ECHOCARDIOGRAM COMPLETE
Height: 63 in
Weight: 2045.87 oz

## 2019-11-11 LAB — HEMOGLOBIN A1C
Hgb A1c MFr Bld: 5.1 % (ref 4.8–5.6)
Mean Plasma Glucose: 99.67 mg/dL

## 2019-11-11 MED ORDER — RESOURCE THICKENUP CLEAR PO POWD
ORAL | Status: DC | PRN
  Filled 2019-11-11: qty 125

## 2019-11-11 MED ORDER — CLOPIDOGREL BISULFATE 75 MG PO TABS
75.0000 mg | ORAL_TABLET | Freq: Every day | ORAL | Status: DC
  Administered 2019-11-12 – 2019-11-24 (×13): 75 mg via ORAL
  Filled 2019-11-11 (×14): qty 1

## 2019-11-11 NOTE — Progress Notes (Addendum)
STROKE TEAM PROGRESS NOTE   INTERVAL HISTORY Her RN is at the bedside.  Pt lying in bed, lethargic but awake alert and following commands. Still plegic on the LUE and paresis on the LLE. Seems to have right foot drop likely to be chronic. Still has left facial droop.    Vitals:   11/11/19 0730 11/11/19 0815 11/11/19 0830 11/11/19 0900  BP: (!) 142/84 (!) 171/91 (!) 171/92 (!) 172/94  Pulse: 68 69 66 70  Resp: 17 16 13 20   Temp:      TempSrc:      SpO2: 98% 98% 98% 97%  Weight:      Height:        CBC:  Recent Labs  Lab 11/10/19 0951 11/10/19 1244  WBC 10.0  --   NEUTROABS 7.5  --   HGB 15.7* 15.0  HCT 48.2* 44.0  MCV 97.2  --   PLT 315  --     Basic Metabolic Panel:  Recent Labs  Lab 11/10/19 0951 11/10/19 1244  NA 138 136  K 4.3 3.6  CL 104 101  CO2 23  --   GLUCOSE 104* 86  BUN 19 19  CREATININE 0.87 0.70  CALCIUM 9.5  --    Lipid Panel:     Component Value Date/Time   CHOL 196 11/11/2019 0456   CHOL 208 (H) 08/05/2018 1657   TRIG 78 11/11/2019 0456   TRIG 150 (H) 11/24/2015 1447   HDL 65 11/11/2019 0456   HDL 65 08/05/2018 1657   HDL 58 11/24/2015 1447   CHOLHDL 3.0 11/11/2019 0456   VLDL 16 11/11/2019 0456   LDLCALC 115 (H) 11/11/2019 0456   LDLCALC 122 (H) 08/05/2018 1657   LDLCALC 105 (H) 02/19/2014 0848   HgbA1c:  Lab Results  Component Value Date   HGBA1C 5.1 11/11/2019   Urine Drug Screen:     Component Value Date/Time   LABOPIA NONE DETECTED 11/10/2019 2119   COCAINSCRNUR NONE DETECTED 11/10/2019 2119   LABBENZ NONE DETECTED 11/10/2019 2119   AMPHETMU NONE DETECTED 11/10/2019 2119   THCU NONE DETECTED 11/10/2019 2119   LABBARB NONE DETECTED 11/10/2019 2119    Alcohol Level     Component Value Date/Time   ETH <10 11/10/2019 0951    IMAGING past 24 hours CT Code Stroke CTA Head W/WO contrast  Result Date: 11/10/2019 CLINICAL DATA:  Generalized weakness.  Encephalopathy. EXAM: CT ANGIOGRAPHY HEAD AND NECK CT PERFUSION BRAIN  TECHNIQUE: Multidetector CT imaging of the head and neck was performed using the standard protocol during bolus administration of intravenous contrast. Multiplanar CT image reconstructions and MIPs were obtained to evaluate the vascular anatomy. Carotid stenosis measurements (when applicable) are obtained utilizing NASCET criteria, using the distal internal carotid diameter as the denominator. Multiphase CT imaging of the brain was performed following IV bolus contrast injection. Subsequent parametric perfusion maps were calculated using RAPID software. CONTRAST:  38mL OMNIPAQUE IOHEXOL 350 MG/ML SOLN COMPARISON:  None. FINDINGS: CTA NECK FINDINGS Aortic arch: Standard 3 vessel aortic arch with minimal atherosclerotic plaque. Widely patent arch vessel origins. Right carotid system: Patent without evidence of stenosis, dissection, or significant atherosclerosis. Left carotid system: Patent without evidence of stenosis, dissection, or significant atherosclerosis. Vertebral arteries: Patent and codominant without evidence of stenosis, dissection, or significant atherosclerosis. Skeleton: Severe facet arthrosis at C3-4 and C4-5 with grade 1 anterolisthesis at both levels. Severe disc degeneration at C5-6 and C6-7. Severe multilevel neural foraminal stenosis in the cervical spine. Other neck: No  evidence of cervical lymphadenopathy or mass. Upper chest: No apical lung consolidation or mass. Review of the MIP images confirms the above findings CTA HEAD FINDINGS Anterior circulation: The internal carotid arteries are patent from skull base to carotid termini with minimal nonstenotic plaque bilaterally. ACAs and MCAs are patent without evidence of proximal branch occlusion or significant proximal stenosis. No aneurysm is identified. Posterior circulation: The intracranial vertebral arteries are widely patent to the basilar. Patent PICA, AICA, and SCA origins are identified bilaterally. Posterior communicating arteries are  not clearly identified and may be diminutive or absent. The PCAs are patent without evidence of significant proximal stenosis. No aneurysm is identified. Venous sinuses: As permitted by contrast timing, patent. Anatomic variants: None. Review of the MIP images confirms the above findings CT Brain Perfusion Findings: CBF (<30%) Volume: 0 mL Perfusion (Tmax>6.0s) volume: 0 mL Mismatch Volume: n/a Infarction Location: None evident by CTP. IMPRESSION: 1. No large vessel occlusion or significant stenosis in the head and neck. 2. Negative CTP. 3. Advanced cervical disc and facet degeneration. 4.  Aortic Atherosclerosis (ICD10-I70.0). These results were communicated to Dr. Rory Percy at 10:53 am on 11/10/2019 by text page via the [facility] messaging system. Electronically Signed   By: Logan Bores M.D.   On: 11/10/2019 11:05   CT Code Stroke CTA Neck W/WO contrast  Result Date: 11/10/2019 CLINICAL DATA:  Generalized weakness.  Encephalopathy. EXAM: CT ANGIOGRAPHY HEAD AND NECK CT PERFUSION BRAIN TECHNIQUE: Multidetector CT imaging of the head and neck was performed using the standard protocol during bolus administration of intravenous contrast. Multiplanar CT image reconstructions and MIPs were obtained to evaluate the vascular anatomy. Carotid stenosis measurements (when applicable) are obtained utilizing NASCET criteria, using the distal internal carotid diameter as the denominator. Multiphase CT imaging of the brain was performed following IV bolus contrast injection. Subsequent parametric perfusion maps were calculated using RAPID software. CONTRAST:  28mL OMNIPAQUE IOHEXOL 350 MG/ML SOLN COMPARISON:  None. FINDINGS: CTA NECK FINDINGS Aortic arch: Standard 3 vessel aortic arch with minimal atherosclerotic plaque. Widely patent arch vessel origins. Right carotid system: Patent without evidence of stenosis, dissection, or significant atherosclerosis. Left carotid system: Patent without evidence of stenosis, dissection, or  significant atherosclerosis. Vertebral arteries: Patent and codominant without evidence of stenosis, dissection, or significant atherosclerosis. Skeleton: Severe facet arthrosis at C3-4 and C4-5 with grade 1 anterolisthesis at both levels. Severe disc degeneration at C5-6 and C6-7. Severe multilevel neural foraminal stenosis in the cervical spine. Other neck: No evidence of cervical lymphadenopathy or mass. Upper chest: No apical lung consolidation or mass. Review of the MIP images confirms the above findings CTA HEAD FINDINGS Anterior circulation: The internal carotid arteries are patent from skull base to carotid termini with minimal nonstenotic plaque bilaterally. ACAs and MCAs are patent without evidence of proximal branch occlusion or significant proximal stenosis. No aneurysm is identified. Posterior circulation: The intracranial vertebral arteries are widely patent to the basilar. Patent PICA, AICA, and SCA origins are identified bilaterally. Posterior communicating arteries are not clearly identified and may be diminutive or absent. The PCAs are patent without evidence of significant proximal stenosis. No aneurysm is identified. Venous sinuses: As permitted by contrast timing, patent. Anatomic variants: None. Review of the MIP images confirms the above findings CT Brain Perfusion Findings: CBF (<30%) Volume: 0 mL Perfusion (Tmax>6.0s) volume: 0 mL Mismatch Volume: n/a Infarction Location: None evident by CTP. IMPRESSION: 1. No large vessel occlusion or significant stenosis in the head and neck. 2. Negative CTP. 3. Advanced  cervical disc and facet degeneration. 4.  Aortic Atherosclerosis (ICD10-I70.0). These results were communicated to Dr. Rory Percy at 10:53 am on 11/10/2019 by text page via the [facility] messaging system. Electronically Signed   By: Logan Bores M.D.   On: 11/10/2019 11:05   MR BRAIN WO CONTRAST  Result Date: 11/10/2019 CLINICAL DATA:  Generalized weakness. Encephalopathy. EXAM: MRI HEAD WITHOUT  CONTRAST TECHNIQUE: Multiplanar, multiecho pulse sequences of the brain and surrounding structures were obtained without intravenous contrast. COMPARISON:  Head CT 11/10/2019 and MRI 02/28/2017 FINDINGS: Brain: There are patchy acute infarcts involving the basal ganglia/corona radiata bilaterally, left larger than right. A lacunar infarct in the posterior limb of the left internal capsule/lateral left thalamus is chronic but new from the prior MRI. T2 hyperintensities elsewhere in the cerebral white matter bilaterally have mildly progressed from the prior MRI and are nonspecific but compatible with mildly age advanced chronic small vessel ischemic disease. No intracranial hemorrhage, mass, midline shift, or extra-axial fluid collection is identified. Mild cerebral atrophy is within normal limits for age. Vascular: Major intracranial vascular flow voids are preserved. Skull and upper cervical spine: Unremarkable bone marrow signal. C3-4 facet arthrosis with grade 1 anterolisthesis. Sinuses/Orbits: Right cataract extraction. Paranasal sinuses and mastoid air cells are clear. Other: None. IMPRESSION: 1. Patchy acute infarcts involving the left greater than right basal ganglia and corona radiata. 2. Mild chronic small vessel ischemic disease. Electronically Signed   By: Logan Bores M.D.   On: 11/10/2019 11:28   CT Code Stroke Cerebral Perfusion with contrast  Result Date: 11/10/2019 CLINICAL DATA:  Generalized weakness.  Encephalopathy. EXAM: CT ANGIOGRAPHY HEAD AND NECK CT PERFUSION BRAIN TECHNIQUE: Multidetector CT imaging of the head and neck was performed using the standard protocol during bolus administration of intravenous contrast. Multiplanar CT image reconstructions and MIPs were obtained to evaluate the vascular anatomy. Carotid stenosis measurements (when applicable) are obtained utilizing NASCET criteria, using the distal internal carotid diameter as the denominator. Multiphase CT imaging of the brain  was performed following IV bolus contrast injection. Subsequent parametric perfusion maps were calculated using RAPID software. CONTRAST:  42mL OMNIPAQUE IOHEXOL 350 MG/ML SOLN COMPARISON:  None. FINDINGS: CTA NECK FINDINGS Aortic arch: Standard 3 vessel aortic arch with minimal atherosclerotic plaque. Widely patent arch vessel origins. Right carotid system: Patent without evidence of stenosis, dissection, or significant atherosclerosis. Left carotid system: Patent without evidence of stenosis, dissection, or significant atherosclerosis. Vertebral arteries: Patent and codominant without evidence of stenosis, dissection, or significant atherosclerosis. Skeleton: Severe facet arthrosis at C3-4 and C4-5 with grade 1 anterolisthesis at both levels. Severe disc degeneration at C5-6 and C6-7. Severe multilevel neural foraminal stenosis in the cervical spine. Other neck: No evidence of cervical lymphadenopathy or mass. Upper chest: No apical lung consolidation or mass. Review of the MIP images confirms the above findings CTA HEAD FINDINGS Anterior circulation: The internal carotid arteries are patent from skull base to carotid termini with minimal nonstenotic plaque bilaterally. ACAs and MCAs are patent without evidence of proximal branch occlusion or significant proximal stenosis. No aneurysm is identified. Posterior circulation: The intracranial vertebral arteries are widely patent to the basilar. Patent PICA, AICA, and SCA origins are identified bilaterally. Posterior communicating arteries are not clearly identified and may be diminutive or absent. The PCAs are patent without evidence of significant proximal stenosis. No aneurysm is identified. Venous sinuses: As permitted by contrast timing, patent. Anatomic variants: None. Review of the MIP images confirms the above findings CT Brain Perfusion Findings: CBF (<30%)  Volume: 0 mL Perfusion (Tmax>6.0s) volume: 0 mL Mismatch Volume: n/a Infarction Location: None evident  by CTP. IMPRESSION: 1. No large vessel occlusion or significant stenosis in the head and neck. 2. Negative CTP. 3. Advanced cervical disc and facet degeneration. 4.  Aortic Atherosclerosis (ICD10-I70.0). These results were communicated to Dr. Rory Percy at 10:53 am on 11/10/2019 by text page via the [facility] messaging system. Electronically Signed   By: Logan Bores M.D.   On: 11/10/2019 11:05   DG Chest Portable 1 View  Result Date: 11/10/2019 CLINICAL DATA:  Altered mental status. EXAM: PORTABLE CHEST 1 VIEW COMPARISON:  12/25/2017 FINDINGS: The heart size and mediastinal contours are within normal limits. Both lungs are clear. The visualized skeletal structures are unremarkable. IMPRESSION: Normal exam. Electronically Signed   By: Lorriane Shire M.D.   On: 11/10/2019 13:22    PHYSICAL EXAM  Pulse Rate:  [61-84] 66 (04/21 1300) Resp:  [13-25] 17 (04/21 1300) BP: (131-213)/(79-125) 169/86 (04/21 1300) SpO2:  [94 %-98 %] 97 % (04/21 1300)  General - Well nourished, well developed, in no apparent distress, lethargic.  Ophthalmologic - fundi not visualized due to noncooperation.  Cardiovascular - Regular rhythm and rate.  Mental Status -  Level of arousal and orientation to time, place, and person were intact. Language including expression, naming, repetition, comprehension was assessed and found intact. Mild dysarthria and psychomotor slowing  Cranial Nerves II - XII - II - Visual field intact OU. III, IV, VI - Extraocular movements intact. V - Facial sensation intact bilaterally. VII - left facial droop. VIII - Hearing & vestibular intact bilaterally. X - Palate elevates symmetrically. XI - Chin turning & shoulder shrug intact bilaterally. XII - Tongue protrusion intact.  Motor Strength - The patient's strength was 4/5 RUE and RLE proximal with right foot drop (likely chronic), however, LUE 0/5 flaccid and LLE 3/5 proximal and 4/5 distally. Bulk was normal and fasciculations were absent.    Motor Tone - Muscle tone was assessed at the neck and appendages and was normal.  Reflexes - The patient's reflexes were symmetrical in all extremities and she had no pathological reflexes.  Sensory - Light touch, temperature/pinprick were assessed and were symmetrical.    Coordination - The patient had normal movements in the right hand with no ataxia or dysmetria.  Tremor was absent.  Gait and Station - deferred.   ASSESSMENT/PLAN Ms. Lynn [name] is a 74 y.o. female with history of HLD and anxiety presenting with weakness and "feeling slow", likely due to b/l BG infarcts, acute and subacute.   Stroke:  Left BG/CR acute infarct with right BG/CR small subacute infarct, likely secondary to small vessel disease  Code Stroke CT head age indeterminate L basal ganglia infarcts. Mild to moderate Small vessel disease.   CTA head & neck no LVO. Aortic atherosclerosis. Advanced CS degeneration  CT perfusion Unremarkable   MRI  Patchy L>R basal ganglia and corona radiata infarcts. Mild small vessel disease.   2D Echo EF 60-65%  Given bilateral involvement, will recommend 30 day cardiac event monitoring as outpt to rule out afib.  LDL 115  HgbA1c 5.1  Lovenox 40 mg sq daily for VTE prophylaxis  aspirin 81 mg daily prior to admission, now on aspirin 81 mg daily and plavix 75mg  DAPT for 3 weeks and then plavix alone  Therapy recommendations:  pending   Disposition:  pending   Hypertension  BP as nigh as 213/108  Home meds:  None, no hx HTN  PTA . Permissive hypertension (OK if < 220/120) but gradually normalize in 2-3 days . Long-term BP goal normotensive  Hyperlipidemia  Home meds:  No statin  Now on lipitor 40  LDL 115, goal < 70   Continue statin at discharge  Other Stroke Risk Factors  Advanced age  Former Cigarette smoker  ETOH use, alcohol level <10, advised to drink no more than 1 drink(s) a day  Other Active Problems  [facility] day #  1  Neurology will sign off. Please call with questions. Pt will follow up with Dr. Delice Lesch at [facility] on 12/07/19. Thanks for the consult.  Rosalin Hawking, MD PhD Stroke Neurology 11/11/2019 6:31 PM    To contact Stroke Continuity provider, please refer to [email or web address] After hours, contact General Neurology

## 2019-11-11 NOTE — Progress Notes (Signed)
Inpatient Rehab Admissions Coordinator Note:   Per PT recommendations, pt was screened for CIR candidacy by Shann Medal, PT, DPT.  At this time, mobility evaluations limited by pt being in the ED.  Will follow for mobility next therapy session to better assess candidacy.  Please contact me with questions.   Shann Medal, PT, DPT [phone] 11/11/19 5:19 PM

## 2019-11-11 NOTE — Progress Notes (Deleted)
  Echocardiogram 2D Echocardiogram has been performed.  Lynn [name] 11/11/2019, 3:31 PM

## 2019-11-11 NOTE — ED Notes (Signed)
Attempted to call report

## 2019-11-11 NOTE — CV Procedure (Signed)
2D echo attempted, but patient in transit to room. Will try later

## 2019-11-11 NOTE — ED Notes (Signed)
Pt coughing after drinking water to take medications.

## 2019-11-11 NOTE — ED Notes (Signed)
Admitting at bedside 

## 2019-11-11 NOTE — ED Notes (Signed)
Attempted to call report - unit not answering.

## 2019-11-11 NOTE — ED Notes (Signed)
Mother at bedside helping pt to eat breakfast.

## 2019-11-11 NOTE — ED Notes (Signed)
Tele   bfast ordered  

## 2019-11-11 NOTE — Evaluation (Signed)
Clinical/Bedside Swallow Evaluation Patient Details  Name: Lynn [name] MRN: [id number] Date of Birth: 1946/06/21  Today's Date: 11/11/2019 Time: SLP Start Time (ACUTE ONLY): 1300 SLP Stop Time (ACUTE ONLY): 1330 SLP Time Calculation (min) (ACUTE ONLY): 30 min  Past Medical History:  Past Medical History:  Diagnosis Date  . Anxiety   . Aortic atherosclerosis ([place])   . Arthritis   . Complication of anesthesia    per pt, slow to wake up past sedation.  . CVA (cerebral vascular accident) ([place]) 11/10/2019  . Depression   . Hyperplastic colon polyp   . Hypertension   . Neuropathy   . Osteoarthritis   . Osteoporosis   . Stomach upset    has a senstive stomach  . Throat clearing    has a cough at times  . Urinary incontinence   . Vitamin D deficiency    Past Surgical History:  Past Surgical History:  Procedure Laterality Date  . COLONOSCOPY    . FACIAL COSMETIC SURGERY     over 15 years ago  . FINGER TENDON REPAIR     middle rt hand-  . HAMMER TOE SURGERY  12/27/2011   Procedure: HAMMER TOE CORRECTION;  Surgeon: Wylene Simmer, MD;  Location: [facility];  Service: Orthopedics;  Laterality: Left;  2-4th   HPI:  Lynn [name] is a 74 y.o. female with medical history significant of HTN and depression presenting with "slow thinking."  She reports she had difficulty talking after waking up.  Her mother's bedroom is downstairs and she came to the head of the stairs and couldn't figure out how to do this and appeared to have difficulty speaking.  +expressive aphasia.   Her left arm is feeling weak.  She had a headache 2 weeks ago.  No h/o prior CVA.  Yesterday was a perfectly normal day.    She was previously healthy and independent until about 2-3 years ago, and has slowed down in the last few years since moving in to her mother's home.  MRI + for B infarcts in basal ganglia and corona radiata. Pt    Assessment / Plan / Recommendation Clinical Impression  Pt  seen in the ED,  demonstrates oral dysphagia due to decreased lingual strength and control as well as signs of aspiration with thin liquids. Immediate coughing observed with sips of water, suspect delay in swallow initiation based on audible swallow. Trials of nectar thick liquids with cup or straw did not lead to coughing. Pt was able to self feed with min cueing for single sips. Though there was some oral residue with solids pt was able to clear with a verbal cue. Recommend initaiting a dys 2 diet with nectar thick liquids. Pt may need MBS for instrumental assessment SLP Visit Diagnosis: Dysphagia, oropharyngeal phase (R13.12)    Aspiration Risk  Moderate aspiration risk    Diet Recommendation Dysphagia 2 (Fine chop);Nectar-thick liquid   Liquid Administration via: Cup;Straw Medication Administration: Crushed with puree Supervision: Patient able to self feed    Other  Recommendations Other Recommendations: Order thickener from pharmacy   Follow up Recommendations Inpatient Rehab      Frequency and Duration min 2x/week  2 weeks       Prognosis Prognosis for Safe Diet Advancement: Good      Swallow Study   General HPI: Lynn [name] is a 74 y.o. female with medical history significant of HTN and depression presenting with "slow thinking."  She reports she had difficulty  talking after waking up.  Her mother's bedroom is downstairs and she came to the head of the stairs and couldn't figure out how to do this and appeared to have difficulty speaking.  +expressive aphasia.   Her left arm is feeling weak.  She had a headache 2 weeks ago.  No h/o prior CVA.  Yesterday was a perfectly normal day.    She was previously healthy and independent until about 2-3 years ago, and has slowed down in the last few years since moving in to her mother's home.  MRI + for B infarcts in basal ganglia and corona radiata. Pt  Type of Study: Bedside Swallow Evaluation Previous Swallow Assessment:  none Diet Prior to this Study: NPO Temperature Spikes Noted: No Respiratory Status: Room air History of Recent Intubation: No Behavior/Cognition: Alert;Cooperative Oral Cavity Assessment: Within Functional Limits Oral Care Completed by SLP: No Oral Cavity - Dentition: Adequate natural dentition Vision: Functional for self-feeding Self-Feeding Abilities: Needs assist Patient Positioning: Upright in bed Baseline Vocal Quality: Normal Volitional Cough: Strong Volitional Swallow: Able to elicit    Oral/Motor/Sensory Function Overall Oral Motor/Sensory Function: Moderate impairment Facial ROM: Reduced left Facial Symmetry: Abnormal symmetry left Facial Strength: Reduced left Lingual ROM: Reduced left Lingual Symmetry: Abnormal symmetry left Lingual Strength: Reduced   Ice Chips Ice chips: Within functional limits   Thin Liquid Thin Liquid: Impaired Presentation: Cup;Straw Oral Phase Functional Implications: Left anterior spillage;Prolonged oral transit Pharyngeal  Phase Impairments: Cough - Immediate;Suspected delayed Swallow    Nectar Thick Nectar Thick Liquid: Impaired Presentation: Cup;Straw;Self Fed Pharyngeal Phase Impairments: Suspected delayed Swallow   Honey Thick Honey Thick Liquid: Not tested   Puree Puree: Impaired Presentation: Spoon;Self Fed Oral Phase Impairments: Reduced lingual movement/coordination Oral Phase Functional Implications: Left lateral sulci pocketing   Solid     Solid: Impaired Presentation: Self Fed Oral Phase Impairments: Reduced lingual movement/coordination Oral Phase Functional Implications: Oral residue     Herbie Baltimore, MA CCC-SLP  Acute Rehabilitation Services Pager [phone] Office [phone]  Lynann Beaver 11/11/2019,2:44 PM

## 2019-11-11 NOTE — ED Notes (Addendum)
Attempted to call report. - unit not answering.

## 2019-11-11 NOTE — Progress Notes (Signed)
Lynn [name]  [id number] DOB: April 08, 1946 DOA: 11/10/2019 PCP: Marin Olp, MD    Brief Narrative:  [phone] with a history of HTN, HLD, and depression/anxiety who presented with transient dysarthria/expressive aphasia and confusion.  She also felt that her left arm was weak.  She has no prior history of CVA.  In the ED she was felt to have left arm weakness and slurred speech.  MRI confirmed bilateral CVAs.  Neurology was consulted and [facility] hospitalist admitted the patient.  Significant Events: 4/20 admit via  for CVA  Antimicrobials:  None  Subjective: Appears to be resting comfortably in bed at the time of my exam.  Does appear to understand everything that I am saying.  Clearly has an expressive dysphagia.  Denies chest pain shortness of breath fevers or chills.  Experienced significant difficulty with attempts to consume sips of water therefore has been changed to n.p.o. with SLP eval pending.  Assessment & Plan:  Acute ischemic bilateral CVAs New left-sided weakness, facial droop, and expressive aphasia -initial CT/CTa negative for acute findings -MRI noted bilateral infarcts of the basal ganglia and corona radiata -neurology directing stroke evaluation -was taking aspirin 81 mg daily previously -likely related to small vessel disease per neurology -A1c not elevated at 5.1  Possible cognitive deficit Will need outpatient follow-up for possible cognitive decline/memory disorder  HTN Practicing permissive hypertension for now  HLD LDL 115 -HDL 65  DVT prophylaxis: Lovenox Code Status: FULL CODE Family Communication:  Disposition Plan:   Consultants:  none  Objective: Blood pressure 138/79, pulse 68, temperature 98.9 F (37.2 C), temperature source Oral, resp. rate 16, height 5\' 3"  (1.6 m), weight 58 kg, last menstrual period 07/23/1998, SpO2 97 %.  Intake/Output Summary (Last 24 hours) at 11/11/2019 0753 Last data filed at 11/11/2019 0610 Gross per  24 hour  Intake 150 ml  Output 700 ml  Net -550 ml   Filed Weights   11/10/19 1159  Weight: 58 kg    Examination: General: No acute respiratory distress Lungs: Clear to auscultation bilaterally without wheezes or crackles Cardiovascular: Regular rate and rhythm without murmur gallop or rub normal S1 and S2 Abdomen: Nontender, nondistended, soft, bowel sounds positive, no rebound, no ascites, no appreciable mass Extremities: No significant cyanosis, clubbing, or edema bilateral lower extremities  CBC: Recent Labs  Lab 11/10/19 0951 11/10/19 1244  WBC 10.0  --   NEUTROABS 7.5  --   HGB 15.7* 15.0  HCT 48.2* 44.0  MCV 97.2  --   PLT 315  --    Basic Metabolic Panel: Recent Labs  Lab 11/10/19 0951 11/10/19 1244  NA 138 136  K 4.3 3.6  CL 104 101  CO2 23  --   GLUCOSE 104* 86  BUN 19 19  CREATININE 0.87 0.70  CALCIUM 9.5  --    GFR: Estimated Creatinine Clearance: 51 mL/min (by C-G formula based on SCr of 0.7 mg/dL).  Liver Function Tests: Recent Labs  Lab 11/10/19 0951  AST 24  ALT 28  ALKPHOS 77  BILITOT 1.1  PROT 6.3*  ALBUMIN 3.7    Coagulation Profile: Recent Labs  Lab 11/10/19 1230  INR 1.0    HbA1C: Hgb A1c MFr Bld  Date/Time Value Ref Range Status  11/11/2019 04:55 AM 5.1 4.8 - 5.6 % Final    Comment:    (NOTE) Pre diabetes:          5.7%-6.4% Diabetes:              >  6.4% Glycemic control for   <7.0% adults with diabetes     CBG: Recent Labs  Lab 11/10/19 0950  GLUCAP 76    Recent Results (from the past 240 hour(s))  SARS CORONAVIRUS 2 (TAT 6-24 HRS) Nasopharyngeal Nasopharyngeal Swab     Status: None   Collection Time: 11/10/19  1:34 PM   Specimen: Nasopharyngeal Swab  Result Value Ref Range Status   SARS Coronavirus 2 NEGATIVE NEGATIVE Final    Comment: (NOTE) SARS-CoV-2 target nucleic acids are NOT DETECTED. The SARS-CoV-2 RNA is generally detectable in upper and lower respiratory specimens during the acute phase of  infection. Negative results do not preclude SARS-CoV-2 infection, do not rule out co-infections with other pathogens, and should not be used as the sole basis for treatment or other patient management decisions. Negative results must be combined with clinical observations, patient history, and epidemiological information. The expected result is Negative. Fact Sheet for Patients: SugarRoll.be Fact Sheet for Healthcare Providers: https://www.woods-mathews.com/ This test is not yet approved or cleared by the [place] FDA and  has been authorized for detection and/or diagnosis of SARS-CoV-2 by FDA under an Emergency Use Authorization (EUA). This EUA will remain  in effect (meaning this test can be used) for the duration of the COVID-19 declaration under Section 56 4(b)(1) of the Act, 21 U.S.C. section 360bbb-3(b)(1), unless the authorization is terminated or revoked sooner. Performed at [facility], [place] [address] [place], [place] [address]      Scheduled Meds: .  stroke: mapping our early stages of recovery book   Does not apply Once  . aspirin EC  81 mg Oral Daily  . atorvastatin  40 mg Oral Daily  . docusate sodium  100 mg Oral BID  . enoxaparin (LOVENOX) injection  40 mg Subcutaneous Q24H   Continuous Infusions: . sodium chloride 50 mL/hr at 11/11/19 [id number]     LOS: 1 day   Cherene Altes, MD [facility] Office  [phone] Pager - Text Page per Shea Evans  If 7PM-7AM, please contact night-coverage per Amion 11/11/2019, 7:53 AM

## 2019-11-11 NOTE — Evaluation (Signed)
Speech Language Pathology Evaluation Patient Details Name: Lynn [name] MRN: [id number] DOB: [birth year] Today's Date: 11/11/2019 Time: 1300-1330 SLP Time Calculation (min) (ACUTE ONLY): 30 min  Problem List:  Patient Active Problem List   Diagnosis Date Noted  . CVA (cerebral vascular accident) ([place]) 11/10/2019  . Senile purpura ([place]) 04/27/2019  . Prolapsed uterus 04/27/2019  . Aortic atherosclerosis ([place]) 06/26/2017  . Neuropathy 05/20/2017  . Abnormality of gait due to impairment of balance 05/20/2017  . Incontinence in female 06/25/2014  . Hearing deficit 06/25/2014  . Osteoporosis 06/24/2013  . Hypertension 05/11/2013  . Osteoarthritis 05/11/2013  . Vitamin D deficiency 05/11/2013  . Personal history of colonic polyps 05/11/2013  . Hyperlipidemia 05/11/2013   Past Medical History:  Past Medical History:  Diagnosis Date  . Anxiety   . Aortic atherosclerosis ([place])   . Arthritis   . Complication of anesthesia    per pt, slow to wake up past sedation.  . CVA (cerebral vascular accident) ([place]) 11/10/2019  . Depression   . Hyperplastic colon polyp   . Hypertension   . Neuropathy   . Osteoarthritis   . Osteoporosis   . Stomach upset    has a senstive stomach  . Throat clearing    has a cough at times  . Urinary incontinence   . Vitamin D deficiency    Past Surgical History:  Past Surgical History:  Procedure Laterality Date  . COLONOSCOPY    . FACIAL COSMETIC SURGERY     over 15 years ago  . FINGER TENDON REPAIR     middle rt hand-  . HAMMER TOE SURGERY  12/27/2011   Procedure: HAMMER TOE CORRECTION;  Surgeon: Wylene Simmer, MD;  Location: [facility];  Service: Orthopedics;  Laterality: Left;  2-4th   HPI:  Lynn [name] is a 74 y.o. female with medical history significant of HTN and depression presenting with "slow thinking."  She reports she had difficulty talking after waking up.  Her mother's bedroom is downstairs and she came to  the head of the stairs and couldn't figure out how to do this and appeared to have difficulty speaking.  +expressive aphasia.   Her left arm is feeling weak.  She had a headache 2 weeks ago.  No h/o prior CVA.  Yesterday was a perfectly normal day.    She was previously healthy and independent until about 2-3 years ago, and has slowed down in the last few years since moving in to her mother's home.  MRI + for B infarcts in basal ganglia and corona radiata. Pt    Assessment / Plan / Recommendation Clinical Impression  Pt demonstrates a moderate dysarthria with decreased intelligibility at word level due to left lingual and labial weakness. Pt improves with articulatory cues for overarticulation. She is able to name objects and read phrases, but the complexity of her verbal output is poor. When asked open ended questions pt makes no attempt to say more than repeating very basic phrases. Pt will need further diagnostic treatment for expressive language and memory. Will initaite treatment for dysarthria as well. Recommend CIR at d/c.     SLP Assessment  SLP Recommendation/Assessment: Patient needs continued Speech Lanaguage Pathology Services SLP Visit Diagnosis: Dysarthria and anarthria (R47.1)    Follow Up Recommendations  Inpatient Rehab    Frequency and Duration min 2x/week  2 weeks      SLP Evaluation Cognition  Overall Cognitive Status: Impaired/Different from baseline Arousal/Alertness: Awake/alert Orientation Level: Oriented  X4 Attention: Focused;Sustained;Selective Focused Attention: Appears intact Sustained Attention: Appears intact Selective Attention: Appears intact Awareness: Impaired Awareness Impairment: Emergent impairment       Comprehension  Auditory Comprehension Overall Auditory Comprehension: Appears within functional limits for tasks assessed    Expression Verbal Expression Overall Verbal Expression: Impaired Initiation: No impairment Automatic Speech:  Name;Social Response Level of Generative/Spontaneous Verbalization: Word Repetition: No impairment Naming: No impairment Pragmatics: No impairment Interfering Components: Speech intelligibility Effective Techniques: Articulatory cues;Phonemic cues Written Expression Dominant Hand: Right   Oral / Motor  Oral Motor/Sensory Function Overall Oral Motor/Sensory Function: Moderate impairment Facial ROM: Reduced left Facial Symmetry: Abnormal symmetry left Facial Strength: Reduced left Facial Sensation: Within Functional Limits Lingual ROM: Reduced left Lingual Symmetry: Abnormal symmetry left Lingual Strength: Reduced Motor Speech Overall Motor Speech: Impaired Respiration: Within functional limits Phonation: Normal Resonance: Within functional limits Articulation: Impaired Level of Impairment: Word Intelligibility: Intelligibility reduced Word: 25-49% accurate Motor Planning: Witnin functional limits Motor Speech Errors: Aware;Consistent   GO                   Herbie Baltimore, MA CCC-SLP  Acute Rehabilitation Services Pager [phone] Office [phone]  Lynann Beaver 11/11/2019, 2:57 PM

## 2019-11-11 NOTE — ED Notes (Signed)
Speech therapy at bedside.

## 2019-11-11 NOTE — Evaluation (Signed)
Physical Therapy Evaluation Patient Details Name: Lynn [name] MRN: [id number] DOB: [birth year] Today's Date: 11/11/2019   History of Present Illness  Pt is a 74 y/o female admitted secondary to L arm weakness and speech difficulty. MRI revealed Patchy acute infarcts involving the left greater than right basal ganglia and corona radiata. PMH includes HTN and depression.  Clinical Impression  Pt admitted secondary to problem above with deficits below. Pt with weakness in L extremities and slurred speech. Pt also with decreased balance and difficulty performing mobility tasks. Required max A to come to long sitting on stretcher in ED this session. Unsafe to attempt further mobility with +1 assist. Feel pt would benefit from CIR level therapies. Will continue to follow acutely to maximize functional mobility independence and safety.     Follow Up Recommendations CIR;Supervision/Assistance - 24 hour    Equipment Recommendations  Other (comment)(TBD)    Recommendations for Other Services       Precautions / Restrictions Precautions Precautions: Fall Restrictions Weight Bearing Restrictions: No      Mobility  Bed Mobility Overal bed mobility: Needs Assistance             General bed mobility comments: Max A to come into long sitting on stretcher. Able to assist with RUE on bed rail, however, unable to assist with LUE. Pt on higher stretcher, so unsafe to attempt further mobility with +1 assist.   Transfers                    Ambulation/Gait                Stairs            Wheelchair Mobility    Modified Rankin (Stroke Patients Only) Modified Rankin (Stroke Patients Only) Pre-Morbid Rankin Score: No symptoms Modified Rankin: Moderately severe disability     Balance                                             Pertinent Vitals/Pain Pain Assessment: No/denies pain    Home Living Family/patient expects to be discharged  to:: Private residence Living Arrangements: Parent Available Help at Discharge: Family;Available 24 hours/day Type of Home: Apartment([place] ) Home Access: Stairs to enter Entrance Stairs-Rails: Right;Left Entrance Stairs-Number of Steps: 3 Home Layout: Two level Home Equipment: Cane - single point;Shower seat      Prior Function Level of Independence: Independent with assistive device(s)         Comments: Using cane for ambulation      Hand Dominance   Dominant Hand: Right    Extremity/Trunk Assessment   Upper Extremity Assessment Upper Extremity Assessment: Defer to OT evaluation;LUE deficits/detail LUE Deficits / Details: No AROM throughout LUE. PROM WFL     Lower Extremity Assessment Lower Extremity Assessment: LLE deficits/detail LLE Deficits / Details: 2/5 throughout LLE. Reports sensation in tact.  LLE Coordination: decreased gross motor;decreased fine motor       Communication   Communication: Expressive difficulties  Cognition Arousal/Alertness: Awake/alert Behavior During Therapy: Flat affect Overall Cognitive Status: Impaired/Different from baseline Area of Impairment: Problem solving                             Problem Solving: Slow processing        General Comments General comments (skin  integrity, edema, etc.): Pt's mom present during session     Exercises General Exercises - Lower Extremity Ankle Circles/Pumps: AAROM;Left;5 reps Heel Slides: AAROM;Left;5 reps   Assessment/Plan    PT Assessment Patient needs continued PT services  PT Problem List Decreased strength;Decreased activity tolerance;Decreased balance;Decreased mobility;Decreased cognition;Decreased coordination;Decreased knowledge of use of DME;Decreased safety awareness;Decreased knowledge of precautions       PT Treatment Interventions DME instruction;Gait training;Functional mobility training;Therapeutic activities;Therapeutic exercise;Balance  training;Patient/family education    PT Goals (Current goals can be found in the Care Plan section)  Acute Rehab PT Goals Patient Stated Goal: to be independent PT Goal Formulation: With patient Time For Goal Achievement: 11/25/19 Potential to Achieve Goals: Good    Frequency Min 4X/week   Barriers to discharge        Co-evaluation               AM-PAC PT "6 Clicks" Mobility  Outcome Measure Help needed turning from your back to your side while in a flat bed without using bedrails?: A Lot Help needed moving from lying on your back to sitting on the side of a flat bed without using bedrails?: A Lot Help needed moving to and from a bed to a chair (including a wheelchair)?: A Lot Help needed standing up from a chair using your arms (e.g., wheelchair or bedside chair)?: A Lot Help needed to walk in hospital room?: Total Help needed climbing 3-5 steps with a railing? : Total 6 Click Score: 10    End of Session   Activity Tolerance: Patient tolerated treatment well Patient left: in bed;with call bell/phone within reach;with family/visitor present Nurse Communication: Mobility status PT Visit Diagnosis: Unsteadiness on feet (R26.81);Muscle weakness (generalized) (M62.81);Difficulty in walking, not elsewhere classified (R26.2);Hemiplegia and hemiparesis Hemiplegia - Right/Left: Left Hemiplegia - dominant/non-dominant: Non-dominant Hemiplegia - caused by: Cerebral infarction    Time: [id number] PT Time Calculation (min) (ACUTE ONLY): 19 min   Charges:   PT Evaluation $PT Eval Moderate Complexity: 1 Mod          Reuel [place], PT, DPT  Acute Rehabilitation Services  Pager: [phone] Office: [phone]   Rudean Hitt 11/11/2019, 1:17 PM

## 2019-11-11 NOTE — Progress Notes (Signed)
  Echocardiogram 2D Echocardiogram has been performed.  Lynn [name] 11/11/2019, 3:32 PM

## 2019-11-12 ENCOUNTER — Inpatient Hospital Stay (HOSPITAL_COMMUNITY): Payer: Medicare Other

## 2019-11-12 LAB — CBC
HCT: 46 % (ref 36.0–46.0)
Hemoglobin: 15.4 g/dL — ABNORMAL HIGH (ref 12.0–15.0)
MCH: 31.8 pg (ref 26.0–34.0)
MCHC: 33.5 g/dL (ref 30.0–36.0)
MCV: 95 fL (ref 80.0–100.0)
Platelets: 326 10*3/uL (ref 150–400)
RBC: 4.84 MIL/uL (ref 3.87–5.11)
RDW: 13.3 % (ref 11.5–15.5)
WBC: 20.6 10*3/uL — ABNORMAL HIGH (ref 4.0–10.5)
nRBC: 0 % (ref 0.0–0.2)

## 2019-11-12 LAB — COMPREHENSIVE METABOLIC PANEL
ALT: 22 U/L (ref 0–44)
AST: 19 U/L (ref 15–41)
Albumin: 3.4 g/dL — ABNORMAL LOW (ref 3.5–5.0)
Alkaline Phosphatase: 78 U/L (ref 38–126)
Anion gap: 13 (ref 5–15)
BUN: 14 mg/dL (ref 8–23)
CO2: 19 mmol/L — ABNORMAL LOW (ref 22–32)
Calcium: 8.9 mg/dL (ref 8.9–10.3)
Chloride: 101 mmol/L (ref 98–111)
Creatinine, Ser: 0.75 mg/dL (ref 0.44–1.00)
GFR calc Af Amer: >60 mL/min (ref >60)
GFR calc non Af Amer: >60 mL/min (ref >60)
Glucose, Bld: 126 mg/dL — ABNORMAL HIGH (ref 70–99)
Potassium: 3.2 mmol/L — ABNORMAL LOW (ref 3.5–5.1)
Sodium: 133 mmol/L — ABNORMAL LOW (ref 135–145)
Total Bilirubin: 1.4 mg/dL — ABNORMAL HIGH (ref 0.3–1.2)
Total Protein: 5.9 g/dL — ABNORMAL LOW (ref 6.5–8.1)

## 2019-11-12 MED ORDER — LABETALOL HCL 5 MG/ML IV SOLN: 10.0000 mg | INTRAVENOUS | Status: DC | PRN

## 2019-11-12 MED ORDER — POTASSIUM CHLORIDE 10 MEQ/100ML IV SOLN
10.0000 meq | INTRAVENOUS | Status: AC
  Administered 2019-11-12 (×3): 10 meq via INTRAVENOUS
  Filled 2019-11-12 (×3): qty 100

## 2019-11-12 NOTE — Evaluation (Signed)
Occupational Therapy Evaluation Patient Details Name: Lynn [name] MRN: [id number] DOB: 07-03-1946 Today's Date: 11/12/2019    History of Present Illness Pt is a 74 y/o female admitted secondary to L arm weakness and speech difficulty. MRI revealed Patchy acute infarcts involving the left greater than right basal ganglia and corona radiata. PMH includes HTN and depression.   Clinical Impression   Pt ambulated with a cane due to painful R knee, but was otherwise independent prior to admission. She lives with her 96 year old mother. Pt pleasant and seemed to appreciate humor, but appears to have impaired attention and slow processing as well as L inattention. Difficult to assess cognition accurately due to expressive deficits. She demonstrates global weakness with L dense hemiplegia and L knee pain with ROM or blocking with attempts to stand or transfer. Pt requires extensive +2 assist for all mobility and moderate to total assist for ADL. Pt will need intensive rehab. Recommending CIR. Will follow acutely.    Follow Up Recommendations  CIR    Equipment Recommendations  Other (comment)(defer to next venue)    Recommendations for Other Services       Precautions / Restrictions Precautions Precautions: Fall Restrictions Weight Bearing Restrictions: No      Mobility Bed Mobility Overal bed mobility: Needs Assistance Bed Mobility: Supine to Sit     Supine to sit: +2 for physical assistance;Total assist     General bed mobility comments: assist for all aspects, verbal cues to elicit pt self assisting using bed rail  Transfers Overall transfer level: Needs assistance Equipment used: 2 person hand held assist Transfers: Squat Pivot Transfers     Squat pivot transfers: +2 physical assistance;Total assist          Balance Overall balance assessment: Needs assistance Sitting-balance support: Feet supported;Bilateral upper extremity supported Sitting balance-Leahy  Scale: Zero Sitting balance - Comments: requires moderate assistance, tends to fall toward the L and posterior     Standing balance-Leahy Scale: Zero                             ADL either performed or assessed with clinical judgement   ADL Overall ADL's : Needs assistance/impaired Eating/Feeding: Moderate assistance;Sitting   Grooming: Brushing hair;Sitting;Total assistance Grooming Details (indicate cue type and reason): wiped mouth with washcloth Upper Body Bathing: Total assistance;Sitting   Lower Body Bathing: Bed level;Total assistance   Upper Body Dressing : Maximal assistance;Sitting   Lower Body Dressing: Total assistance;Bed level   Toilet Transfer: +2 for physical assistance;Squat-pivot;Total assistance Toilet Transfer Details (indicate cue type and reason): simulated to chair Toileting- Clothing Manipulation and Hygiene: Total assistance;Bed level               Vision         Perception Perception Perception Tested?: Yes Perception Deficits: Inattention/neglect Inattention/Neglect: Does not attend to left side of body;Does not attend to left visual field   Praxis      Pertinent Vitals/Pain Pain Assessment: Faces Faces Pain Scale: Hurts even more Pain Location: L knee Pain Descriptors / Indicators: Grimacing;Guarding;Sore;Discomfort Pain Intervention(s): Monitored during session;Repositioned     Hand Dominance Right   Extremity/Trunk Assessment Upper Extremity Assessment Upper Extremity Assessment: RUE deficits/detail;LUE deficits/detail RUE Deficits / Details: generalized weakness LUE Deficits / Details: flaccid LUE Sensation: decreased proprioception;decreased light touch LUE Coordination: decreased fine motor;decreased gross motor   Lower Extremity Assessment Lower Extremity Assessment: Defer to PT evaluation   Cervical /  Trunk Assessment Cervical / Trunk Assessment: Other exceptions Cervical / Trunk Exceptions: weakness,  tendency to keep head rotated and laterally flexed to R   Communication Communication Communication: Expressive difficulties   Cognition Arousal/Alertness: Awake/alert Behavior During Therapy: WFL for tasks assessed/performed Overall Cognitive Status: Impaired/Different from baseline Area of Impairment: Attention;Following commands;Problem solving                   Current Attention Level: Sustained   Following Commands: Follows one step commands with increased time     Problem Solving: Slow processing;Decreased initiation;Difficulty sequencing;Requires verbal cues;Requires tactile cues General Comments: difficult to accurately assess cognition due to expressive difficulties   General Comments       Exercises     Shoulder Instructions      Home Living Family/patient expects to be discharged to:: Private residence Living Arrangements: Parent(34 year old mother) Available Help at Discharge: Family;Available 24 hours/day Type of Home: Apartment Home Access: Stairs to enter Entrance Stairs-Number of Steps: 3 Entrance Stairs-Rails: Right;Left Home Layout: Two level Alternate Level Stairs-Number of Steps: flight Alternate Level Stairs-Rails: Left Bathroom Shower/Tub: Teacher, early years/pre: Standard     Home Equipment: Cane - single point;Shower seat          Prior Functioning/Environment Level of Independence: Independent with assistive device(s)        Comments: Using cane for ambulation         OT Problem List: Decreased strength;Decreased activity tolerance;Impaired balance (sitting and/or standing);Impaired vision/perception;Decreased coordination;Decreased cognition;Decreased safety awareness;Decreased knowledge of use of DME or AE;Impaired tone;Impaired sensation;Impaired UE functional use;Pain      OT Treatment/Interventions: Self-care/ADL training;Neuromuscular education;DME and/or AE instruction;Therapeutic activities;Cognitive  remediation/compensation;Visual/perceptual remediation/compensation;Patient/family education;Balance training    OT Goals(Current goals can be found in the care plan section) Acute Rehab OT Goals Patient Stated Goal: to be independent OT Goal Formulation: With patient/family Time For Goal Achievement: 11/26/19 Potential to Achieve Goals: Good ADL Goals Pt Will Perform Eating: with set-up;sitting Pt Will Perform Grooming: with min assist;sitting Pt Will Transfer to Toilet: with mod assist;stand pivot transfer;bedside commode Additional ADL Goal #1: Pt will perform bed mobility with moderate assistance in preparation for ADL. Additional ADL Goal #2: Pt will demonstrate fair sitting balance in preparation for ADL. Additional ADL Goal #3: Pt will attend to L hemispace with minimal verbal cues during ADL.  OT Frequency: Min 3X/week   Barriers to D/C:            Co-evaluation PT/OT/SLP Co-Evaluation/Treatment: Yes Reason for Co-Treatment: For patient/therapist safety   OT goals addressed during session: Strengthening/ROM      AM-PAC OT "6 Clicks" Daily Activity     Outcome Measure Help from another person eating meals?: A Lot Help from another person taking care of personal grooming?: Total Help from another person toileting, which includes using toliet, bedpan, or urinal?: Total Help from another person bathing (including washing, rinsing, drying)?: Total Help from another person to put on and taking off regular upper body clothing?: A Lot Help from another person to put on and taking off regular lower body clothing?: Total 6 Click Score: 8   End of Session Equipment Utilized During Treatment: Gait belt Nurse Communication: Mobility status  Activity Tolerance: Patient tolerated treatment well Patient left: in chair;with call bell/phone within reach;with chair alarm set;with family/visitor present  OT Visit Diagnosis: Muscle weakness (generalized) (M62.81);Hemiplegia and  hemiparesis;Pain;Other symptoms and signs involving cognitive function Hemiplegia - Right/Left: Left Hemiplegia - dominant/non-dominant: Non-Dominant Hemiplegia - caused  by: Cerebral infarction Pain - Right/Left: Left Pain - part of body: Knee                Time: [id number] OT Time Calculation (min): 37 min Charges:  OT General Charges $OT Visit: 1 Visit OT Evaluation $OT Eval Moderate Complexity: 1 Mod  Nestor Lewandowsky, OTR/L Acute Rehabilitation Services Pager: [phone] Office: [phone]  Malka So 11/12/2019, 2:31 PM

## 2019-11-12 NOTE — Consult Note (Signed)
Physical Medicine and Rehabilitation Consult   Reason for Consult: Stroke with functional decline.  Referring Physician: Dr. Thereasa Solo   HPI: Lynn [name] is a 74 [place].o. female with history of HTN, depression, neuropathy, OA; who was admitted on 11/10/19 due to cognitive changes.  History taken from chart review and mother due to cognitive deficits.  Plans are for patient to return home with 23 year old mother who cannot provide physical assistance at discharge.  She also reported headaches x2 weeks as well as left upper extremity weakness.  UDS negative.  CT head showed indeterminate versus possible recent left basal ganglia region infarct.  CTA head/neck and CT perfusion was negative for LVO and significant stenosis and advanced cervical DDD and facet degeneration noted.   MRI brain personally reviewed, showing bilateral, left greater than sign right moderate size patchy infarcts.  Per MRI/MRA brain done, patchy acute infarcts involving left > right basal ganglia and corona radiata.  Echocardiogram showed ejection fraction of 60-65% with mild AVR.  Stroke felt to be secondary to small vessel disease and recommendations are for DAPT followed by Plavix alone.   Outpatient cognitive evaluation recommended for reports of possible cognitive decline/memory disorder.  Swallow evaluation done 4/22 revealing moderate oral deficits with spillage contributing since aspiration due to delay in swallow.  Hospital course further complicated by post stroke dysphagia and she was started on a D1 nectar thick liquid diet.  Patient with significant weakness Left >Right, moderate dysarthria with minimal verbal output, delayed processing and left knee pain affecting ADLs and mobility. CIR recommended due to functional decline.   Review of Systems  Unable to perform ROS: Mental acuity   Past Medical History:  Diagnosis Date  . Anxiety   . Aortic atherosclerosis ([place])   . Arthritis   . Complication of  anesthesia    per pt, slow to wake up past sedation.  . CVA (cerebral vascular accident) ([place]) 11/10/2019  . Depression   . Hyperplastic colon polyp   . Hypertension   . Neuropathy   . Osteoarthritis   . Osteoporosis   . Stomach upset    has a senstive stomach  . Throat clearing    has a cough at times  . Urinary incontinence   . Vitamin D deficiency     Past Surgical History:  Procedure Laterality Date  . COLONOSCOPY    . FACIAL COSMETIC SURGERY     over 15 years ago  . FINGER TENDON REPAIR     middle rt hand-  . HAMMER TOE SURGERY  12/27/2011   Procedure: HAMMER TOE CORRECTION;  Surgeon: Wylene Simmer, MD;  Location: [facility];  Service: Orthopedics;  Laterality: Left;  2-4th    Family History  Problem Relation Age of Onset  . Arthritis Mother   . Colon cancer Father   . COPD Father 26       died at 54  . Throat cancer Brother   . Breast cancer Other   . Esophageal cancer Neg Hx   . Rectal cancer Neg Hx   . Stomach cancer Neg Hx   . Stroke Neg Hx     Social History:  Retired 5 years ago. 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 She moved to locally 5 years ago to live with her 80 year old mother. Has sister nad brother in town--son in [place]. Per r eports that she quit smoking about 43 years ago. She quit after 10.00 years  of use. She has never used smokeless tobacco. She reports current alcohol use. She reports that she does not use drugs.    Allergies: No Known Allergies    Medications Prior to Admission  Medication Sig Dispense Refill  . aspirin 81 MG tablet Take 81 mg by mouth daily.    . diclofenac (VOLTAREN) 75 MG EC tablet Take 1 tablet (75 mg total) by mouth 2 (two) times daily. 180 tablet 3    Home: Home Living Family/patient expects to be discharged to:: Private residence Living Arrangements: Parent(28 year old mother) Available Help at Discharge: Family, Available 24 hours/day Type of Home: Apartment Home Access:  Stairs to enter Technical brewer of Steps: 3 Entrance Stairs-Rails: Right, Left Home Layout: Two level Alternate Level Stairs-Number of Steps: flight Alternate Level Stairs-Rails: Left Bathroom Shower/Tub: Chiropodist: Standard Home Equipment: Radio producer - single point, Civil engineer, contracting  Lives With: (mother)  Functional History: Prior Function Level of Independence: Independent with assistive device(s) Comments: Using cane for ambulation  Functional Status:  Mobility: Bed Mobility Overal bed mobility: Needs Assistance Bed Mobility: Supine to Sit Supine to sit: +2 for physical assistance, Total assist General bed mobility comments: total assist +2 for trunk elevation, LE management to EOB, scooting to EOB with use of bed pads and lateral leaning. Verbal cuing for use of bedrails to assist, especially with RUE during initiation of supine to sit. Transfers Overall transfer level: Needs assistance Equipment used: 2 person hand held assist Transfers: Squat Pivot Transfers, Sit to/from Stand Sit to Stand: Max assist, +2 physical assistance, +2 safety/equipment, From elevated surface Squat pivot transfers: +2 physical assistance, Total assist General transfer comment: Max +2 for sit to stand for power up, steadying, bilateral knee blocking, pelvic facilitation for closed chain hip extension. Verbal cuing for use of UEs to push to stand, very limited. Total assist for squat pivot to recliner at bedside, towards pt's R side which is slightly stronger. Ambulation/Gait General Gait Details: unable this day    ADL: ADL Overall ADL's : Needs assistance/impaired Eating/Feeding: Moderate assistance, Sitting Grooming: Brushing hair, Sitting, Total assistance Grooming Details (indicate cue type and reason): wiped mouth with washcloth Upper Body Bathing: Total assistance, Sitting Lower Body Bathing: Bed level, Total assistance Upper Body Dressing : Maximal assistance,  Sitting Lower Body Dressing: Total assistance, Bed level Toilet Transfer: +2 for physical assistance, Squat-pivot, Total assistance Toilet Transfer Details (indicate cue type and reason): simulated to chair Toileting- Clothing Manipulation and Hygiene: Total assistance, Bed level  Cognition: Cognition Overall Cognitive Status: Impaired/Different from baseline Arousal/Alertness: Awake/alert Orientation Level: Oriented X4 Attention: Focused, Sustained, Selective Focused Attention: Appears intact Sustained Attention: Appears intact Selective Attention: Appears intact Awareness: Impaired Awareness Impairment: Emergent impairment Cognition Arousal/Alertness: Awake/alert Behavior During Therapy: WFL for tasks assessed/performed Overall Cognitive Status: Impaired/Different from baseline Area of Impairment: Attention, Following commands, Problem solving Current Attention Level: Sustained Following Commands: Follows one step commands with increased time Problem Solving: Slow processing, Decreased initiation, Difficulty sequencing, Requires verbal cues, Requires tactile cues General Comments: difficult to accurately assess cognition due to expressive difficulties. Smiles/laughs appropriately during session, requires increased time to respond to verbal cues during mobility.   Blood pressure (!) 176/80, pulse 73, temperature 98.6 F (37 C), resp. rate 16, height 5\' 3"  (1.6 m), weight 58 kg, last menstrual period 07/23/1998, SpO2 96 %. Physical Exam  Nursing note and vitals reviewed. Constitutional: She appears well-developed and well-nourished.  Fatigued  HENT:  Head: Normocephalic and atraumatic.  Eyes: EOM  are normal. Right eye exhibits no discharge. Left eye exhibits no discharge.  Neck: No tracheal deviation present. No thyromegaly present.  Respiratory: Effort normal. No stridor. No respiratory distress.  GI: Soft. She exhibits no distension.  Musculoskeletal:     Comments: Left  knee effusion noted.   Neurological:  30Somnolent Left facial weakness Severe dysarthria Dysphonia ?  Global aphasia Motor: Limited due to lethargy, RUE/RLE: 0/5 proximal distal LUE: 3/5 proximal distal Sensation?  Intact to light touch  Skin: Skin is warm and dry.  Callous on RLE 2nd digit  Psychiatric:  Limited due to mentation and lethargy    Results for orders placed or performed during the hospital encounter of 11/10/19 (from the past 24 hour(s))  Basic metabolic panel     Status: Abnormal   Collection Time: 11/13/19  3:32 AM  Result Value Ref Range   Sodium 135 135 - 145 mmol/L   Potassium 3.2 (L) 3.5 - 5.1 mmol/L   Chloride 107 98 - 111 mmol/L   CO2 20 (L) 22 - 32 mmol/L   Glucose, Bld 120 (H) 70 - 99 mg/dL   BUN 15 8 - 23 mg/dL   Creatinine, Ser 0.66 0.44 - 1.00 mg/dL   Calcium 8.7 (L) 8.9 - 10.3 mg/dL   GFR calc non Af Amer >60 >60 mL/min   GFR calc Af Amer >60 >60 mL/min   Anion gap 8 5 - 15  Magnesium     Status: None   Collection Time: 11/13/19  3:32 AM  Result Value Ref Range   Magnesium 1.8 1.7 - 2.4 mg/dL   DG Swallowing Func-Speech Pathology  Result Date: 11/12/2019 Objective Swallowing Evaluation: Type of Study: MBS-Modified Barium Swallow Study  Patient Details Name: BRITTANNIE SZAFRANSKI MRN: [id number] Date of Birth: 1946/07/17 Today's Date: 11/12/2019 Time: SLP Start Time (ACUTE ONLY): 0910 -SLP Stop Time (ACUTE ONLY): 0930 SLP Time Calculation (min) (ACUTE ONLY): 20 min Past Medical History: Past Medical History: Diagnosis Date . Anxiety  . Aortic atherosclerosis ([place])  . Arthritis  . Complication of anesthesia   per pt, slow to wake up past sedation. . CVA (cerebral vascular accident) ([place]) 11/10/2019 . Depression  . Hyperplastic colon polyp  . Hypertension  . Neuropathy  . Osteoarthritis  . Osteoporosis  . Stomach upset   has a senstive stomach . Throat clearing   has a cough at times . Urinary incontinence  . Vitamin D deficiency  Past Surgical History:  Past Surgical History: Procedure Laterality Date . COLONOSCOPY   . FACIAL COSMETIC SURGERY    over 15 years ago . FINGER TENDON REPAIR    middle rt hand- . HAMMER TOE SURGERY  12/27/2011  Procedure: HAMMER TOE CORRECTION;  Surgeon: Wylene Simmer, MD;  Location: [facility];  Service: Orthopedics;  Laterality: Left;  2-4th HPI: SIENNA BARTOSZEK is a 74 [place].o. female with medical history significant of HTN and depression presenting with "slow thinking."  She reports she had difficulty talking after waking up.  Her mother's bedroom is downstairs and she came to the head of the stairs and couldn't figure out how to do this and appeared to have difficulty speaking.  +expressive aphasia.   Her left arm is feeling weak.  She had a headache 2 weeks ago.  No h/o prior CVA.  Yesterday was a perfectly normal day.    She was previously healthy and independent until about 2-3 years ago, and has slowed down in the last few years since moving  in to her mother's home.  MRI + for B infarcts in basal ganglia and corona radiata. Pt  No data recorded Assessment / Plan / Recommendation CHL IP CLINICAL IMPRESSIONS 11/12/2019 Clinical Impression Pt demonstrates dysphagia with moderate oral deficits including difculty achieving a labial seal and lingual poordination for bolus formation and transit. There are instances of anterior and posterior spillage contributing to sensed aspiration events also due to delayed swallow inititiation. Moderate oral residue remained with solids, but pt benefitted from verbal cues to fully clear oral cavity by gather lingual residue. Simplest method for oral intake of liquids is teaspoon given the aforementioned deficits. There is also a moderate pharyngeal dysphagia with weakness of the  base of tongue and pharyngeal wall with mild residuals with all textures.  Attempted positional strategies, but most helpful were verbal and tactile cues for a neutral (not leaning) position and verbal cues to  swallow again. Recommend nectar thick liquids, pureed solids and f/u therapy to target oral bolus control for upgraded solids.  Effortful swallows may also be beneficial.   SLP Visit Diagnosis Dysphagia, oropharyngeal phase (R13.12) Attention and concentration deficit following -- Frontal lobe and executive function deficit following -- Impact on safety and function Moderate aspiration risk   CHL IP TREATMENT RECOMMENDATION 11/12/2019 Treatment Recommendations Therapy as outlined in treatment plan below   Prognosis 11/11/2019 Prognosis for Safe Diet Advancement Good Barriers to Reach Goals -- Barriers/Prognosis Comment -- CHL IP DIET RECOMMENDATION 11/12/2019 SLP Diet Recommendations Dysphagia 1 (Puree) solids;Nectar thick liquid Liquid Administration via Spoon Medication Administration -- Compensations Slow rate;Small sips/bites;Follow solids with liquid;Multiple dry swallows after each bite/sip;Effortful swallow Postural Changes Seated upright at 90 degrees   CHL IP OTHER RECOMMENDATIONS 11/12/2019 Recommended Consults -- Oral Care Recommendations -- Other Recommendations Order thickener from pharmacy   CHL IP FOLLOW UP RECOMMENDATIONS 11/12/2019 Follow up Recommendations Inpatient Rehab   CHL IP FREQUENCY AND DURATION 11/12/2019 Speech Therapy Frequency (ACUTE ONLY) min 2x/week Treatment Duration 2 weeks      CHL IP ORAL PHASE 11/12/2019 Oral Phase Impaired Oral - Pudding Teaspoon -- Oral - Pudding Cup -- Oral - Honey Teaspoon -- Oral - Honey Cup -- Oral - Nectar Teaspoon Weak lingual manipulation;Incomplete tongue to palate contact;Reduced posterior propulsion;Lingual/palatal residue;Decreased bolus cohesion;Delayed oral transit Oral - Nectar Cup Weak lingual manipulation;Incomplete tongue to palate contact;Reduced posterior propulsion;Lingual/palatal residue;Decreased bolus cohesion;Delayed oral transit Oral - Nectar Straw Weak lingual manipulation;Incomplete tongue to palate contact;Reduced posterior  propulsion;Lingual/palatal residue;Decreased bolus cohesion;Delayed oral transit Oral - Thin Teaspoon -- Oral - Thin Cup Weak lingual manipulation;Incomplete tongue to palate contact;Reduced posterior propulsion;Lingual/palatal residue;Decreased bolus cohesion;Delayed oral transit Oral - Thin Straw Weak lingual manipulation;Incomplete tongue to palate contact;Reduced posterior propulsion;Lingual/palatal residue;Decreased bolus cohesion;Delayed oral transit Oral - Puree -- Oral - Mech Soft -- Oral - Regular -- Oral - Multi-Consistency -- Oral - Pill -- Oral Phase - Comment --  CHL IP PHARYNGEAL PHASE 11/12/2019 Pharyngeal Phase Impaired Pharyngeal- Pudding Teaspoon -- Pharyngeal -- Pharyngeal- Pudding Cup -- Pharyngeal -- Pharyngeal- Honey Teaspoon -- Pharyngeal -- Pharyngeal- Honey Cup -- Pharyngeal -- Pharyngeal- Nectar Teaspoon Reduced epiglottic inversion;Reduced tongue base retraction;Delayed swallow initiation-vallecula;Pharyngeal residue - valleculae;Pharyngeal residue - pyriform Pharyngeal -- Pharyngeal- Nectar Cup Reduced epiglottic inversion;Reduced tongue base retraction;Delayed swallow initiation-vallecula;Pharyngeal residue - valleculae;Pharyngeal residue - pyriform;Penetration/Aspiration before swallow;Trace aspiration Pharyngeal Material enters airway, passes BELOW cords without attempt by patient to eject out (silent aspiration);Material enters airway, CONTACTS cords and then ejected out Pharyngeal- Nectar Straw -- Pharyngeal -- Pharyngeal- Thin Teaspoon Reduced  epiglottic inversion;Reduced tongue base retraction;Delayed swallow initiation-vallecula;Pharyngeal residue - valleculae;Pharyngeal residue - pyriform;Penetration/Aspiration before swallow Pharyngeal Material enters airway, passes BELOW cords and not ejected out despite cough attempt by patient Pharyngeal- Thin Cup Reduced epiglottic inversion;Reduced tongue base retraction;Delayed swallow initiation-vallecula;Pharyngeal residue -  valleculae;Pharyngeal residue - pyriform;Penetration/Aspiration before swallow Pharyngeal Material enters airway, passes BELOW cords and not ejected out despite cough attempt by patient Pharyngeal- Thin Straw Reduced epiglottic inversion;Reduced tongue base retraction;Delayed swallow initiation-vallecula;Pharyngeal residue - valleculae;Pharyngeal residue - pyriform Pharyngeal -- Pharyngeal- Puree Reduced epiglottic inversion;Reduced tongue base retraction;Delayed swallow initiation-vallecula;Pharyngeal residue - valleculae;Pharyngeal residue - pyriform Pharyngeal -- Pharyngeal- Mechanical Soft -- Pharyngeal -- Pharyngeal- Regular -- Pharyngeal -- Pharyngeal- Multi-consistency -- Pharyngeal -- Pharyngeal- Pill -- Pharyngeal -- Pharyngeal Comment --  No flowsheet data found. DeBlois, Katherene Ponto 11/12/2019, 2:24 PM              ECHOCARDIOGRAM COMPLETE  Result Date: 11/11/2019    ECHOCARDIOGRAM REPORT   Patient Name:   GLEE HINIKER Date of Exam: 11/11/2019 Medical Rec #:  [id number]            Height:       63.0 in Accession #:    [id number]           Weight:       127.9 lb Date of Birth:  February 23, 1946           BSA:          1.599 m Patient Age:    88 years             BP:           165/86 mmHg Patient Gender: F                    HR:           69 bpm. Exam Location:  Inpatient Procedure: 2D Echo, Cardiac Doppler, Color Doppler and 3D Echo Indications:    Stroke  History:        Patient has no prior history of Echocardiogram examinations.                 Stroke; Risk Factors:Hypertension and Dyslipidemia.  Sonographer:    Roseanna Rainbow RDCS Referring Phys: [place]  1. Left ventricular ejection fraction, by estimation, is 60 to 65%. The left ventricle has normal function. The left ventricle has no regional wall motion abnormalities. There is mild concentric left ventricular hypertrophy. Left ventricular diastolic parameters are consistent with Grade I diastolic dysfunction (impaired  relaxation).  2. Right ventricular systolic function is normal. The right ventricular size is normal. Tricuspid regurgitation signal is inadequate for assessing PA pressure.  3. The mitral valve is grossly normal. No evidence of mitral valve regurgitation. No evidence of mitral stenosis.  4. The aortic valve is tricuspid. Aortic valve regurgitation is mild. No aortic stenosis is present.  5. The inferior vena cava is normal in size with greater than 50% respiratory variability, suggesting right atrial pressure of 3 mmHg. FINDINGS  Left Ventricle: Left ventricular ejection fraction, by estimation, is 60 to 65%. The left ventricle has normal function. The left ventricle has no regional wall motion abnormalities. The left ventricular internal cavity size was normal in size. There is  mild concentric left ventricular hypertrophy. Left ventricular diastolic parameters are consistent with Grade I diastolic dysfunction (impaired relaxation). Normal left ventricular filling pressure. Right Ventricle: The right ventricular size is normal. No increase in right ventricular wall  thickness. Right ventricular systolic function is normal. Tricuspid regurgitation signal is inadequate for assessing PA pressure. Left Atrium: Left atrial size was normal in size. Right Atrium: Right atrial size was normal in size. Pericardium: Trivial pericardial effusion is present. Presence of pericardial fat pad. Mitral Valve: The mitral valve is grossly normal. No evidence of mitral valve regurgitation. No evidence of mitral valve stenosis. Tricuspid Valve: The tricuspid valve is grossly normal. Tricuspid valve regurgitation is not demonstrated. No evidence of tricuspid stenosis. Aortic Valve: The aortic valve is tricuspid. Aortic valve regurgitation is mild. Aortic regurgitation PHT measures 411 msec. No aortic stenosis is present. Aortic valve mean gradient measures 4.0 mmHg. Aortic valve peak gradient measures 7.3 mmHg. Aortic  valve area, by  VTI measures 1.66 cm. Pulmonic Valve: The pulmonic valve was grossly normal. Pulmonic valve regurgitation is not visualized. No evidence of pulmonic stenosis. Aorta: The aortic root and ascending aorta are structurally normal, with no evidence of dilitation. Venous: The inferior vena cava is normal in size with greater than 50% respiratory variability, suggesting right atrial pressure of 3 mmHg. IAS/Shunts: The atrial septum is grossly normal.  LEFT VENTRICLE PLAX 2D LVIDd:         3.20 cm     Diastology LVIDs:         2.10 cm     LV e' lateral:   5.98 cm/s LV PW:         1.20 cm     LV E/e' lateral: 6.2 LV IVS:        1.30 cm     LV e' medial:    4.41 cm/s LVOT diam:     1.70 cm     LV E/e' medial:  8.4 LV SV:         41 LV SV Index:   26 LVOT Area:     2.27 cm  LV Volumes (MOD) LV vol d, MOD A2C: 57.3 ml LV vol d, MOD A4C: 59.1 ml LV vol s, MOD A2C: 16.1 ml LV vol s, MOD A4C: 22.1 ml LV SV MOD A2C:     41.2 ml LV SV MOD A4C:     59.1 ml LV SV MOD BP:      43.9 ml RIGHT VENTRICLE             IVC RV S prime:     13.60 cm/s  IVC diam: 1.00 cm TAPSE (M-mode): 2.1 cm LEFT ATRIUM             Index       RIGHT ATRIUM          Index LA diam:        2.70 cm 1.69 cm/m  RA Area:     9.88 cm LA Vol (A2C):   16.5 ml 10.32 ml/m RA Volume:   20.50 ml 12.82 ml/m LA Vol (A4C):   16.3 ml 10.19 ml/m LA Biplane Vol: 18.0 ml 11.26 ml/m  AORTIC VALVE AV Area (Vmax):    1.48 cm AV Area (Vmean):   1.55 cm AV Area (VTI):     1.66 cm AV Vmax:           135.00 cm/s AV Vmean:          88.700 cm/s AV VTI:            0.246 m AV Peak Grad:      7.3 mmHg AV Mean Grad:      4.0 mmHg LVOT Vmax:  88.30 cm/s LVOT Vmean:        60.500 cm/s LVOT VTI:          0.180 m LVOT/AV VTI ratio: 0.73 AI PHT:            411 msec  AORTA Ao Root diam: 2.60 cm Ao Asc diam:  3.90 cm MITRAL VALVE MV Area (PHT): 3.27 cm    SHUNTS MV Decel Time: 232 msec    Systemic VTI:  0.18 m MV E velocity: 37.20 cm/s  Systemic Diam: 1.70 cm MV A velocity: 79.40  cm/s MV E/A ratio:  0.47 Eleonore Chiquito MD Electronically signed by Eleonore Chiquito MD Signature Date/Time: 11/11/2019/5:18:18 PM    Final     Assessment/Plan: Diagnosis: Bilateral, left > right patchy infarct Stroke: Continue secondary stroke prophylaxis and Risk Factor Modification listed below:   Antiplatelet therapy:   Blood Pressure Management:  Continue current medication with prn's with permisive HTN per primary team Statin Agent:   Right > left sided hemiparesis: fit for orthosis to prevent contractures (resting hand splint for day, wrist cock up splint at night, PRAFO, etc) PT/OT for mobility, ADL training  Motor recovery: Fluoxetine Labs and images (see above) independently reviewed.  Records reviewed and summated above.  1. Does the need for close, 24 hr/day medical supervision in concert with the patient's rehab needs make it unreasonable for this patient to be served in a less intensive setting? Yes  2. Co-Morbidities requiring supervision/potential complications: HTN (monitor and provide prns in accordance with increased physical exertion and pain), depression (ensure mood does not hinder progress of therapies), neuropathy, OA, hypokalemia (continue to monitor and replete as necessary), leukocytosis (repeat labs, cont to monitor for signs and symptoms of infection, further workup if indicated) 3. Due to bladder management, bowel management, safety, skin/wound care, disease management, medication administration, pain management and patient education, does the patient require 24 hr/day rehab nursing? Yes 4. Does the patient require coordinated care of a physician, rehab nurse, therapy disciplines of PT/OT/SLP to address physical and functional deficits in the context of the above medical diagnosis(es)? Yes Addressing deficits in the following areas: balance, endurance, locomotion, strength, transferring, bowel/bladder control, bathing, dressing, feeding, grooming, toileting, cognition,  speech, language, swallowing and psychosocial support 5. Can the patient actively participate in an intensive therapy program of at least 3 hrs of therapy per day at least 5 days per week? Yes 6. The potential for patient to make measurable gains while on inpatient rehab is excellent 7. Anticipated functional outcomes upon discharge from inpatient rehab are min assist and mod assist  with PT, min assist and mod assist with OT, min assist with SLP. 8. Estimated rehab length of stay to reach the above functional goals is: 22-27 days. 9. Anticipated discharge destination: Other 10. Overall Rehab/Functional Prognosis: good  RECOMMENDATIONS: This patient's condition is appropriate for continued rehabilitative care in the following setting: CIR to decrease burden of care. Patient has agreed to participate in recommended program. Potentially Note that insurance prior authorization may be required for reimbursement for recommended care.  Comment: Rehab Admissions Coordinator to follow up.  I have personally performed a face to face diagnostic evaluation, including, but not limited to relevant history and physical exam findings, of this patient and developed relevant assessment and plan.  Additionally, I have reviewed and concur with the physician assistant's documentation above.   Delice Lesch, MD, ABPMR Bary Leriche, PA-C 11/13/2019

## 2019-11-12 NOTE — Progress Notes (Addendum)
Nurse and NT transferred patient back to bed from chair.  Patient is a VERY heavy 2 assist.  Patient unable to bear much weight.  Staff had extremely difficult time just to get patient's buttocks on the bed.  Maybe patient will do better earlier in the shift.  Will need to trial different times.

## 2019-11-12 NOTE — Progress Notes (Signed)
Initial Nutrition Assessment  DOCUMENTATION CODES:   Not applicable  INTERVENTION:   - Vital Cuisine Shake BID, each supplement provides 520 kcal and 22 grams of protein  - Encourage adequate PO intake  - Provide feeding assistance as needed  NUTRITION DIAGNOSIS:   Increased nutrient needs related to acute illness as evidenced by estimated needs.  GOAL:   Patient will meet greater than or equal to 90% of their needs  MONITOR:   PO intake, Supplement acceptance, Diet advancement, Labs, Weight trends  REASON FOR ASSESSMENT:   Consult Other (stroke)  ASSESSMENT:   74 year old female who presented on 4/20 with transient dysarthria/expressive aphasia and confusion. PMH of HTN, HLD, depression/anxiety. Pt found to have acute ischemic bilateral CVAs.   Diet advanced to Dysphagia 2 with nectar-thick liquids after SLP evaluation on 4/21. Diet downgraded to Dysphagia 1 today after MBSS. No meal completions recorded.  Spoke with pt's mother at bedside. Family member brought in milkshake from [employer] during [place] interview. Explained current diet order and limitations. Pt's mother expressed understanding.  Pt's history was obtained from pt's mother due to pt with expressive aphasia. Pt's mother reports that pt has a good appetite and eats 3 meals daily. Pt lives with her mother. Pt's mother is not sure whether pt has experienced any weight changes but reports that she has not noticed any changes physically. Pt's mother unsure of pt's UBW.  Pt's mother with plans to feed pt from lunch tray. Pt's mother interested in pt receiving oral nutrition supplement to aid in meeting kcal and protein needs.  Reviewed weight history in chart. Weight stable over the last year.  Medications reviewed and include: colace, IV KCl 10 mEq x 3 runs, NS @ 75 ml/hr  Labs reviewed: sodium 133, potassium 3.2  UOP: 600 ml x 24 hours  NUTRITION - FOCUSED PHYSICAL EXAM:  Unable to complete at this time. Pt  eating lunch.  Diet Order:   Diet Order            DIET - DYS 1 Room service appropriate? Yes; Fluid consistency: Nectar Thick  Diet effective now              EDUCATION NEEDS:   Education needs have been addressed  Skin:  Skin Assessment: Reviewed RN Assessment  Last BM:  11/09/19  Height:   Ht Readings from Last 1 Encounters:  11/10/19 5\' 3"  (1.6 m)    Weight:   Wt Readings from Last 1 Encounters:  11/10/19 58 kg    Ideal Body Weight:  52.3 kg  BMI:  Body mass index is 22.65 kg/m.  Estimated Nutritional Needs:   Kcal:  1500-1700  Protein:  70-85 grams  Fluid:  1.5-1.7 L    Gaynell Face, MS, RD, LDN Inpatient Clinical Dietitian Pager: [phone] Weekend/After Hours: [phone]

## 2019-11-12 NOTE — Progress Notes (Signed)
Lynn [name]  [id number] DOB: 12/03/1945 DOA: 11/10/2019 PCP: Marin Olp, MD    Brief Narrative:  [phone] with a history of HTN, HLD, and depression/anxiety who presented with dysarthria/expressive aphasia and confusion.  She also felt that her left arm was weak.  She has no prior history of CVA.  In the ED she was confirmedto have left arm weakness and slurred speech.  MRI confirmed bilateral CVAs.  Neurology was consulted and [facility] admitted the patient.  Significant Events: 4/20 admit via [place] for CVA 4/21 TTE  Antimicrobials:  None  Subjective: Clinically appears about the same today. Perhaps somewhat more interactive. Dysarthria/expressive aphasia persist. Denies chest pain or shortness of breath. Does not appear to be uncomfortable.  Assessment & Plan:  Acute ischemic bilateral CVAs New left-sided weakness, facial droop, and expressive aphasia -initial CT/CTa negative for acute findings -MRI noted bilateral infarcts of the basal ganglia and corona radiata -neurology directed her stroke evaluation-was taking aspirin 81 mg daily previously -felt to have been related to small vessel disease per neurology -A1c not elevated at 5.1 -plan is to add Plavix to aspirin for 3 months then transition to Plavix only thereafter  Dysphagia Consequence of CVA -cleared for D2 diet with nectar thick liquids by SLP -hopeful for further advancement of diet with ongoing speech therapy  Possible cognitive deficit Will need outpatient follow-up for possible cognitive decline/memory disorder  HTN Practicing permissive hypertension for now  HLD LDL 115 -HDL 65 -continue Lipitor  Hypokalemia Likely due to limited intake since admission -supplement via IV -check magnesium  Hyponatremia Likely due to simple mild dehydration -hydrate and follow  DVT prophylaxis: Lovenox Code Status: FULL CODE Family Communication: Spoke with mother and brother at bedside Disposition  Plan: Physical therapy recommends CIR -patient was previously living independently -plan is for discharge to CIR when insurance authorization completed and bed available  Consultants:  none  Objective: Blood pressure (!) 155/86, pulse 76, temperature 98.7 F (37.1 C), resp. rate 14, height 5\' 3"  (1.6 m), weight 58 kg, last menstrual period 07/23/1998, SpO2 95 %.  Intake/Output Summary (Last 24 hours) at 11/12/2019 0909 Last data filed at 11/12/2019 0600 Gross per 24 hour  Intake 1888.07 ml  Output 600 ml  Net 1288.07 ml   Filed Weights   11/10/19 1159  Weight: 58 kg    Examination: General: No acute respiratory distress Lungs: Clear to auscultation bilaterally without wheezes or crackles Cardiovascular: Regular rate and rhythm without murmur gallop or rub normal S1 and S2 Abdomen: Nontender, nondistended, soft, bowel sounds positive, no rebound, no ascites, no appreciable mass Extremities: No significant edema bilateral lower extremities  CBC: Recent Labs  Lab 11/10/19 0951 11/10/19 1244 11/12/19 0344  WBC 10.0  --  20.6*  NEUTROABS 7.5  --   --   HGB 15.7* 15.0 15.4*  HCT 48.2* 44.0 46.0  MCV 97.2  --  95.0  PLT 315  --  [id number]   Basic Metabolic Panel: Recent Labs  Lab 11/10/19 0951 11/10/19 1244 11/12/19 0344  NA 138 136 133*  K 4.3 3.6 3.2*  CL 104 101 101  CO2 23  --  19*  GLUCOSE 104* 86 126*  BUN 19 19 14   CREATININE 0.87 0.70 0.75  CALCIUM 9.5  --  8.9   GFR: Estimated Creatinine Clearance: 51 mL/min (by C-G formula based on SCr of 0.75 mg/dL).  Liver Function Tests: Recent Labs  Lab 11/10/19 0951 11/12/19 0344  AST 24 19  ALT 28 22  ALKPHOS 77 78  BILITOT 1.1 1.4*  PROT 6.3* 5.9*  ALBUMIN 3.7 3.4*    Coagulation Profile: Recent Labs  Lab 11/10/19 1230  INR 1.0    HbA1C: Hgb A1c MFr Bld  Date/Time Value Ref Range Status  11/11/2019 04:55 AM 5.1 4.8 - 5.6 % Final    Comment:    (NOTE) Pre diabetes:          5.7%-6.4% Diabetes:               >6.4% Glycemic control for   <7.0% adults with diabetes     CBG: Recent Labs  Lab 11/10/19 0950  GLUCAP 76    Recent Results (from the past 240 hour(s))  SARS CORONAVIRUS 2 (TAT 6-24 HRS) Nasopharyngeal Nasopharyngeal Swab     Status: None   Collection Time: 11/10/19  1:34 PM   Specimen: Nasopharyngeal Swab  Result Value Ref Range Status   SARS Coronavirus 2 NEGATIVE NEGATIVE Final    Comment: (NOTE) SARS-CoV-2 target nucleic acids are NOT DETECTED. The SARS-CoV-2 RNA is generally detectable in upper and lower respiratory specimens during the acute phase of infection. Negative results do not preclude SARS-CoV-2 infection, do not rule out co-infections with other pathogens, and should not be used as the sole basis for treatment or other patient management decisions. Negative results must be combined with clinical observations, patient history, and epidemiological information. The expected result is Negative. Fact Sheet for Patients: SugarRoll.be Fact Sheet for Healthcare Providers: https://www.woods-mathews.com/ This test is not yet approved or cleared by the [place] FDA and  has been authorized for detection and/or diagnosis of SARS-CoV-2 by FDA under an Emergency Use Authorization (EUA). This EUA will remain  in effect (meaning this test can be used) for the duration of the COVID-19 declaration under Section 56 4(b)(1) of the Act, 21 U.S.C. section 360bbb-3(b)(1), unless the authorization is terminated or revoked sooner. Performed at [facility], [place] [address], [place], [place] [address]      Scheduled Meds: . aspirin EC  81 mg Oral Daily  . atorvastatin  40 mg Oral Daily  . clopidogrel  75 mg Oral Daily  . docusate sodium  100 mg Oral BID  . enoxaparin (LOVENOX) injection  40 mg Subcutaneous Q24H   Continuous Infusions: . sodium chloride 50 mL/hr at 11/11/19 1711     LOS: 2 days   Cherene Altes, MD [facility] Office  [phone] Pager - Text Page per Shea Evans  If 7PM-7AM, please contact night-coverage per Amion 11/12/2019, 9:09 AM

## 2019-11-12 NOTE — Progress Notes (Signed)
Physical Therapy Treatment Patient Details Name: Lynn [name] MRN: [id number] DOB: 1946-04-28 Today's Date: 11/12/2019    History of Present Illness Pt is a 73 y/o female admitted secondary to L arm weakness and speech difficulty. MRI revealed Patchy acute infarcts involving the left greater than right basal ganglia and corona radiata. PMH includes HTN and depression.    PT Comments    Pt drowsy upon arrival to room, but rouses with PT and OT speaking and interactions. PT focus of session was on sitting balance, finding midline, attending to L, and transfer OOB to recliner at bedside. Pt requires max-total +2 for mobility at this time, bilateral LEs demonstrating significant weakness L>R. Per pt's brother, pt with pre-existing weakness of RLE with use of cane PTA. PT continuing to strongly recommend CIR to maximize function post-acutely, both pt and family are encouraged and motivated by this [name]/c plan.   Follow Up Recommendations  CIR;Supervision/Assistance - 24 hour     Equipment Recommendations  Other (comment)(TBD)    Recommendations for Other Services       Precautions / Restrictions Precautions Precautions: Fall Restrictions Weight Bearing Restrictions: No    Mobility  Bed Mobility Overal bed mobility: Needs Assistance Bed Mobility: Supine to Sit     Supine to sit: +2 for physical assistance;Total assist     General bed mobility comments: total assist +2 for trunk elevation, LE management to EOB, scooting to EOB with use of bed pads and lateral leaning. Verbal cuing for use of bedrails to assist, especially with RUE during initiation of supine to sit.  Transfers Overall transfer level: Needs assistance Equipment used: 2 person hand held assist Transfers: Set designer Transfers;Sit to/from Stand Sit to Stand: Max assist;+2 physical assistance;+2 safety/equipment;From elevated surface   Squat pivot transfers: +2 physical assistance;Total assist     General  transfer comment: Max +2 for sit to stand for power up, steadying, bilateral knee blocking, pelvic facilitation for closed chain hip extension. Verbal cuing for use of UEs to push to stand, very limited. Total assist for squat pivot to recliner at bedside, towards pt's R side which is slightly stronger.  Ambulation/Gait             General Gait Details: unable this day   Stairs             Wheelchair Mobility    Modified Rankin (Stroke Patients Only) Modified Rankin (Stroke Patients Only) Pre-Morbid Rankin Score: No symptoms Modified Rankin: Severe disability     Balance Overall balance assessment: Needs assistance Sitting-balance support: Feet supported;Bilateral upper extremity supported Sitting balance-Leahy Scale: Zero Sitting balance - Comments: requires moderate assistance, tends to fall toward the L and posterior. sat EOB x10 minutes     Standing balance-Leahy Scale: Zero Standing balance comment: max +2 for stand                            Cognition Arousal/Alertness: Awake/alert Behavior During Therapy: WFL for tasks assessed/performed Overall Cognitive Status: Impaired/Different from baseline Area of Impairment: Attention;Following commands;Problem solving                   Current Attention Level: Sustained   Following Commands: Follows one step commands with increased time     Problem Solving: Slow processing;Decreased initiation;Difficulty sequencing;Requires verbal cues;Requires tactile cues General Comments: difficult to accurately assess cognition due to expressive difficulties. Smiles/laughs appropriately during session, requires increased time to respond to verbal cues  during mobility.      Exercises Other Exercises Other Exercises: Pt and family education - Pt with L inattention, sit company and stimulation on L side of pt to promote attending to L    General Comments General comments (skin integrity, edema, etc.): pt's  17 year old mother present during session, along with pt's brother.      Pertinent Vitals/Pain Pain Assessment: Faces Faces Pain Scale: Hurts even more Pain Location: L knee Pain Descriptors / Indicators: Grimacing;Guarding;Discomfort;Other (Comment)(stiff) Pain Intervention(s): Limited activity within patient's tolerance;Monitored during session;Repositioned    Home Living Family/patient expects to be discharged to:: Private residence Living Arrangements: Parent(54 year old mother) Available Help at Discharge: Family;Available 24 hours/day Type of Home: Apartment Home Access: Stairs to enter Entrance Stairs-Rails: Right;Left Home Layout: Two level Home Equipment: Cane - single point;Shower seat      Prior Function Level of Independence: Independent with assistive device(s)      Comments: Using cane for ambulation    PT Goals (current goals can now be found in the care plan section) Acute Rehab PT Goals Patient Stated Goal: to be independent PT Goal Formulation: With patient Time For Goal Achievement: 11/25/19 Potential to Achieve Goals: Good Progress towards PT goals: Progressing toward goals    Frequency    Min 4X/week      PT Plan Current plan remains appropriate    Co-evaluation PT/OT/SLP Co-Evaluation/Treatment: Yes Reason for Co-Treatment: For patient/therapist safety;To address functional/ADL transfers PT goals addressed during session: Mobility/safety with mobility;Balance OT goals addressed during session: Strengthening/ROM      AM-PAC PT "6 Clicks" Mobility   Outcome Measure  Help needed turning from your back to your side while in a flat bed without using bedrails?: A Lot Help needed moving from lying on your back to sitting on the side of a flat bed without using bedrails?: A Lot Help needed moving to and from a bed to a chair (including a wheelchair)?: Total Help needed standing up from a chair using your arms (e.g., wheelchair or bedside chair)?:  Total Help needed to walk in hospital room?: Total Help needed climbing 3-5 steps with a railing? : Total 6 Click Score: 8    End of Session Equipment Utilized During Treatment: Gait belt Activity Tolerance: Patient tolerated treatment well Patient left: with call bell/phone within reach;with family/visitor present;in chair;with chair alarm set Nurse Communication: Mobility status PT Visit Diagnosis: Unsteadiness on feet (R26.81);Muscle weakness (generalized) (M62.81);Difficulty in walking, not elsewhere classified (R26.2);Hemiplegia and hemiparesis Hemiplegia - Right/Left: Left Hemiplegia - dominant/non-dominant: Non-dominant Hemiplegia - caused by: Cerebral infarction     Time: 1320-1356 PT Time Calculation (min) (ACUTE ONLY): 36 min  Charges:  $Therapeutic Activity: 8-22 mins                     Lynn [name] E, PT Acute Rehabilitation Services Pager [phone]  Office [phone]   Lynn [name] Lynn [name] 11/12/2019, 4:41 PM

## 2019-11-12 NOTE — Progress Notes (Signed)
Modified Barium Swallow Progress Note  Patient Details  Name: Lynn [name] MRN: [id number] Date of Birth: 08-Nov-1945  Today's Date: 11/12/2019  Modified Barium Swallow completed.  Full report located under Chart Review in the Imaging Section.  Brief recommendations include the following:  Clinical Impression  Pt demonstrates dysphagia with moderate oral deficits including difculty achieving a labial seal and lingual poordination for bolus formation and transit. There are instances of anterior and posterior spillage contributing to sensed aspiration events also due to delayed swallow inititiation. Moderate oral residue remained with solids, but pt benefitted from verbal cues to fully clear oral cavity by gather lingual residue. Simplest method for oral intake of liquids is teaspoon given the aforementioned deficits. There is also a moderate pharyngeal dysphagia with weakness of the  base of tongue and pharyngeal wall with mild residuals with all textures.  Attempted positional strategies, but most helpful were verbal and tactile cues for a neutral (not leaning) position and verbal cues to swallow again. Recommend nectar thick liquids, pureed solids and f/u therapy to target oral bolus control for upgraded solids.  Effortful swallows may also be beneficial.     Swallow Evaluation Recommendations       SLP Diet Recommendations: Dysphagia 1 (Puree) solids;Nectar thick liquid   Liquid Administration via: Spoon           Compensations: Slow rate;Small sips/bites;Follow solids with liquid;Multiple dry swallows after each bite/sip;Effortful swallow   Postural Changes: Seated upright at 90 degrees       Other Recommendations: Order thickener from pharmacy    Jastin Fore, Katherene Ponto 11/12/2019,2:24 PM

## 2019-11-13 ENCOUNTER — Inpatient Hospital Stay (HOSPITAL_COMMUNITY): Payer: Medicare Other

## 2019-11-13 DIAGNOSIS — D72829 Elevated white blood cell count, unspecified: Secondary | ICD-10-CM

## 2019-11-13 DIAGNOSIS — E876 Hypokalemia: Secondary | ICD-10-CM

## 2019-11-13 DIAGNOSIS — R4182 Altered mental status, unspecified: Secondary | ICD-10-CM

## 2019-11-13 DIAGNOSIS — E7800 Pure hypercholesterolemia, unspecified: Secondary | ICD-10-CM

## 2019-11-13 DIAGNOSIS — F32A Depression, unspecified: Secondary | ICD-10-CM

## 2019-11-13 DIAGNOSIS — F329 Major depressive disorder, single episode, unspecified: Secondary | ICD-10-CM

## 2019-11-13 DIAGNOSIS — I10 Essential (primary) hypertension: Secondary | ICD-10-CM

## 2019-11-13 DIAGNOSIS — M1990 Unspecified osteoarthritis, unspecified site: Secondary | ICD-10-CM

## 2019-11-13 DIAGNOSIS — G629 Polyneuropathy, unspecified: Secondary | ICD-10-CM

## 2019-11-13 DIAGNOSIS — I63313 Cerebral infarction due to thrombosis of bilateral middle cerebral arteries: Secondary | ICD-10-CM

## 2019-11-13 LAB — MAGNESIUM: Magnesium: 1.8 mg/dL (ref 1.7–2.4)

## 2019-11-13 LAB — BASIC METABOLIC PANEL
Anion gap: 8 (ref 5–15)
BUN: 15 mg/dL (ref 8–23)
CO2: 20 mmol/L — ABNORMAL LOW (ref 22–32)
Calcium: 8.7 mg/dL — ABNORMAL LOW (ref 8.9–10.3)
Chloride: 107 mmol/L (ref 98–111)
Creatinine, Ser: 0.66 mg/dL (ref 0.44–1.00)
GFR calc Af Amer: >60 mL/min (ref >60)
GFR calc non Af Amer: >60 mL/min (ref >60)
Glucose, Bld: 120 mg/dL — ABNORMAL HIGH (ref 70–99)
Potassium: 3.2 mmol/L — ABNORMAL LOW (ref 3.5–5.1)
Sodium: 135 mmol/L (ref 135–145)

## 2019-11-13 LAB — GLUCOSE, CAPILLARY: Glucose-Capillary: 127 mg/dL — ABNORMAL HIGH (ref 70–99)

## 2019-11-13 MED ORDER — DILTIAZEM HCL 30 MG PO TABS
30.0000 mg | ORAL_TABLET | Freq: Three times a day (TID) | ORAL | Status: AC
  Administered 2019-11-13 – 2019-11-16 (×10): 30 mg via ORAL
  Filled 2019-11-13 (×11): qty 1

## 2019-11-13 MED ORDER — IOHEXOL 350 MG/ML SOLN
115.0000 mL | Freq: Once | INTRAVENOUS | Status: AC | PRN
  Administered 2019-11-13: 115 mL via INTRAVENOUS

## 2019-11-13 MED ORDER — POTASSIUM CHLORIDE 10 MEQ/100ML IV SOLN
10.0000 meq | INTRAVENOUS | Status: AC
  Administered 2019-11-13 (×3): 10 meq via INTRAVENOUS
  Filled 2019-11-13 (×3): qty 100

## 2019-11-13 NOTE — Progress Notes (Signed)
Lynn [name]  [id number] DOB: [birth year] DOA: 11/10/2019 PCP: Marin Olp, MD    Brief Narrative:  [phone] with a history of HTN, HLD, and depression/anxiety who presented with dysarthria/expressive aphasia and confusion.  She also felt that her left arm was weak.  She has no prior history of CVA.  In the ED she was confirmedto have left arm weakness and slurred speech.  MRI confirmed bilateral CVAs.  Neurology was consulted and [facility] admitted the patient.  Significant Events: 4/20 admit via [place] for CVA 4/21 TTE EF 60-65% 4/22 diet advanced to dysphagia 1 with nectar thick liquids  Antimicrobials:  None  Subjective: Resting comfortably in a bedside chair.  No significant change in physical exam.  Denies any complaints.  Appears content.  Spoke with her mother at length at bedside and also with the CIR coordinator.  Assessment & Plan:  Acute ischemic bilateral CVAs - L>R BG and CR New left-sided weakness, facial droop, and expressive aphasia -initial CT suggested a possible left basal ganglia infarct and CTa was negative for large vessel occlusion or severe stenosis -subsequent MRI noted bilateral infarcts of the basal ganglia and corona radiata and mild small vessel disease - neurology directed her stroke evaluation - was taking aspirin 81 mg daily previously -acute strokes are felt to have been related to small vessel disease - A1c not elevated at 5.1 -LDL elevated and treatment begun - plan is to add Plavix to aspirin for 3 months then transition to Plavix only thereafter -given bilateral involvement neurology has recommended a 30-day cardiac event monitor as an outpatient to rule out atrial fibrillation -PT/OT are recommending CIR and the patient is interested  Dysphagia Consequence of CVA -cleared for D2 diet with nectar thick liquids by SLP initially and now upgraded to dysphagia 1 with nectar thick liquids - hopeful for further advancement of diet with  ongoing speech therapy  Possible cognitive deficit Will need outpatient follow-up for possible cognitive decline/memory disorder -family reports this was present prior to this event  HTN We have now reached the point where careful lowering/normalization of her blood pressure is appropriate -medical therapy being adjusted today  HLD LDL 115 - HDL 65 -Lipitor initiated -check LFTs in approximately 4-5 weeks  Hypokalemia Likely due to limited intake since admission -continue to supplement -magnesium is adequate  Hyponatremia Likely due to simple mild dehydration -corrected with volume resuscitation  DVT prophylaxis: Lovenox Code Status: FULL CODE Family Communication:  Disposition Plan: Physical therapy recommends CIR -patient was previously living independently -plan is for discharge to CIR when insurance authorization completed and bed available  Consultants:  none  Objective: Blood pressure (!) 176/80, pulse 73, temperature 98.6 F (37 C), resp. rate 16, height 5\' 3"  (1.6 m), weight 58 kg, last menstrual period 07/23/1998, SpO2 96 %.  Intake/Output Summary (Last 24 hours) at 11/13/2019 0755 Last data filed at 11/13/2019 0148 Gross per 24 hour  Intake 1388.16 ml  Output 950 ml  Net 438.16 ml   Filed Weights   11/10/19 1159  Weight: 58 kg    Examination: General: No acute respiratory distress Lungs: Clear to auscultation without wheezing Cardiovascular: RRR without murmur Abdomen: NT/ND, soft Extremities: No significant edema bilateral lower extremities  CBC: Recent Labs  Lab 11/10/19 0951 11/10/19 1244 11/12/19 0344  WBC 10.0  --  20.6*  NEUTROABS 7.5  --   --   HGB 15.7* 15.0 15.4*  HCT 48.2* 44.0 46.0  MCV 97.2  --  95.0  PLT 315  --  [id number]   Basic Metabolic Panel: Recent Labs  Lab 11/10/19 0951 11/10/19 0951 11/10/19 1244 11/12/19 0344 11/13/19 0332  NA 138   < > 136 133* 135  K 4.3   < > 3.6 3.2* 3.2*  CL 104   < > 101 101 107  CO2 23  --   --   19* 20*  GLUCOSE 104*   < > 86 126* 120*  BUN 19   < > 19 14 15   CREATININE 0.87   < > 0.70 0.75 0.66  CALCIUM 9.5  --   --  8.9 8.7*  MG  --   --   --   --  1.8   < > = values in this interval not displayed.   GFR: Estimated Creatinine Clearance: 51 mL/min (by C-G formula based on SCr of 0.66 mg/dL).  Liver Function Tests: Recent Labs  Lab 11/10/19 0951 11/12/19 0344  AST 24 19  ALT 28 22  ALKPHOS 77 78  BILITOT 1.1 1.4*  PROT 6.3* 5.9*  ALBUMIN 3.7 3.4*    Coagulation Profile: Recent Labs  Lab 11/10/19 1230  INR 1.0    HbA1C: Hgb A1c MFr Bld  Date/Time Value Ref Range Status  11/11/2019 04:55 AM 5.1 4.8 - 5.6 % Final    Comment:    (NOTE) Pre diabetes:          5.7%-6.4% Diabetes:              >6.4% Glycemic control for   <7.0% adults with diabetes     CBG: Recent Labs  Lab 11/10/19 0950  GLUCAP 76    Recent Results (from the past 240 hour(s))  SARS CORONAVIRUS 2 (TAT 6-24 HRS) Nasopharyngeal Nasopharyngeal Swab     Status: None   Collection Time: 11/10/19  1:34 PM   Specimen: Nasopharyngeal Swab  Result Value Ref Range Status   SARS Coronavirus 2 NEGATIVE NEGATIVE Final    Comment: (NOTE) SARS-CoV-2 target nucleic acids are NOT DETECTED. The SARS-CoV-2 RNA is generally detectable in upper and lower respiratory specimens during the acute phase of infection. Negative results do not preclude SARS-CoV-2 infection, do not rule out co-infections with other pathogens, and should not be used as the sole basis for treatment or other patient management decisions. Negative results must be combined with clinical observations, patient history, and epidemiological information. The expected result is Negative. Fact Sheet for Patients: SugarRoll.be Fact Sheet for Healthcare Providers: https://www.woods-mathews.com/ This test is not yet approved or cleared by the [place] FDA and  has been authorized for detection  and/or diagnosis of SARS-CoV-2 by FDA under an Emergency Use Authorization (EUA). This EUA will remain  in effect (meaning this test can be used) for the duration of the COVID-19 declaration under Section 56 4(b)(1) of the Act, 21 U.S.C. section 360bbb-3(b)(1), unless the authorization is terminated or revoked sooner. Performed at [facility], [place] [address] [place], [place] [address]      Scheduled Meds: . aspirin EC  81 mg Oral Daily  . atorvastatin  40 mg Oral Daily  . clopidogrel  75 mg Oral Daily  . docusate sodium  100 mg Oral BID  . enoxaparin (LOVENOX) injection  40 mg Subcutaneous Q24H   Continuous Infusions: . sodium chloride 75 mL/hr at 11/13/19 0148     LOS: 3 days   Cherene Altes, MD [facility] Office  [phone] Pager - Text Page per Shea Evans  If 7PM-7AM, please contact night-coverage per Amion 11/13/2019, 7:55 AM

## 2019-11-13 NOTE — Progress Notes (Signed)
Physical Therapy Treatment Patient Details Name: Lynn [name] MRN: [id number] DOB: 1946-06-17 Today's Date: 11/13/2019    History of Present Illness Pt is a 74 y/o female admitted secondary to L arm weakness and speech difficulty. MRI revealed Patchy acute infarcts involving the left greater than right basal ganglia and corona radiata. PMH includes HTN and depression.    PT Comments    Pt sleeping upon arrival, per pt's mother at bedside pt has been fatigued today. Pt continues to require max-total +2 for bed mobility and transfers, but pt does participate as able with power through LEs, hand placement during transfers, and core activation during sitting balance and transfers. PT focused on seated balance, maintaining upright, and head in midline and looking L this day once in recliner, PT recommended pt guests sit on her L to encourage looking L. PT continuing to strongly recommend CIR, will continue to follow acutely and progress mobility as able.    Follow Up Recommendations  CIR;Supervision/Assistance - 24 hour     Equipment Recommendations  Other (comment)(TBD)    Recommendations for Other Services       Precautions / Restrictions Precautions Precautions: Fall Restrictions Weight Bearing Restrictions: No    Mobility  Bed Mobility Overal bed mobility: Needs Assistance Bed Mobility: Supine to Sit     Supine to sit: +2 for physical assistance;Total assist     General bed mobility comments: total assist +2 for trunk and LE management, scooting to EOB. Increased time with PT correcting pt upright.  Transfers Overall transfer level: Needs assistance Equipment used: 2 person hand held assist Transfers: Squat Pivot Transfers     Squat pivot transfers: +2 physical assistance;Total assist;From elevated surface     General transfer comment: squat pivot total +2 for power up, hip clearance, and placement in recliner. PT facilitated pt involvement with pt's RUE on  destination surface, having pt weight shift in preparation for transfer. Drop arm recliner, dropped on L, with transfer to pt's R.  Ambulation/Gait             General Gait Details: unable this day   Stairs             Wheelchair Mobility    Modified Rankin (Stroke Patients Only) Modified Rankin (Stroke Patients Only) Pre-Morbid Rankin Score: No symptoms Modified Rankin: Severe disability     Balance Overall balance assessment: Needs assistance Sitting-balance support: Feet supported;Bilateral upper extremity supported Sitting balance-Leahy Scale: Poor Sitting balance - Comments: requires min assist for maintaining upright this day, posterior and L lateral lean preference. Pt with preference for cervical R rotation, L inattention.   Standing balance support: Bilateral upper extremity supported;During functional activity Standing balance-Leahy Scale: Zero Standing balance comment: squat pivot only this day                            Cognition Arousal/Alertness: Awake/alert(drowsy) Behavior During Therapy: WFL for tasks assessed/performed Overall Cognitive Status: Impaired/Different from baseline Area of Impairment: Attention;Following commands;Problem solving;Memory                   Current Attention Level: Sustained Memory: Decreased recall of precautions Following Commands: Follows one step commands with increased time     Problem Solving: Slow processing;Decreased initiation;Difficulty sequencing;Requires verbal cues;Requires tactile cues General Comments: Pt sleeping upon PT arrival to room, rouses to voice. Pt follows one-step commands well with increased time and tactile/verbal cuing.      Exercises General  Exercises - Lower Extremity Long Arc Quad: AAROM;Both;5 reps;Seated(for ROM bilateral knees, engagement of quads. Contraction only on L.) Other Exercises Other Exercises: pull to upright sit in recliner, x5 reps, with RUE use of  recliner armrest and mod assist for L trunk rise. Other Exercises: Sitting EOB x5 minutes, preparation for transfer and maintaining upright. PT facilitated LUE propping.    General Comments General comments (skin integrity, edema, etc.): pt's mother present during session      Pertinent Vitals/Pain Pain Assessment: Faces Faces Pain Scale: Hurts even more Pain Location: bilateral knees, during ROM Pain Descriptors / Indicators: Grimacing;Guarding;Discomfort;Other (Comment) Pain Intervention(s): Limited activity within patient's tolerance;Monitored during session;Repositioned    Home Living                      Prior Function            PT Goals (current goals can now be found in the care plan section) Acute Rehab PT Goals Patient Stated Goal: to be independent PT Goal Formulation: With patient Time For Goal Achievement: 11/25/19 Potential to Achieve Goals: Good Progress towards PT goals: Progressing toward goals    Frequency    Min 4X/week      PT Plan Current plan remains appropriate    Co-evaluation              AM-PAC PT "6 Clicks" Mobility   Outcome Measure  Help needed turning from your back to your side while in a flat bed without using bedrails?: A Lot Help needed moving from lying on your back to sitting on the side of a flat bed without using bedrails?: Total Help needed moving to and from a bed to a chair (including a wheelchair)?: Total Help needed standing up from a chair using your arms (e.g., wheelchair or bedside chair)?: Total Help needed to walk in hospital room?: Total Help needed climbing 3-5 steps with a railing? : Total 6 Click Score: 7    End of Session Equipment Utilized During Treatment: Gait belt Activity Tolerance: Patient limited by fatigue Patient left: with call bell/phone within reach;with family/visitor present;in chair;with chair alarm set Nurse Communication: Mobility status PT Visit Diagnosis: Unsteadiness on feet  (R26.81);Muscle weakness (generalized) (M62.81);Difficulty in walking, not elsewhere classified (R26.2);Hemiplegia and hemiparesis Hemiplegia - Right/Left: Left Hemiplegia - dominant/non-dominant: Non-dominant Hemiplegia - caused by: Cerebral infarction     Time: 1209-1239 PT Time Calculation (min) (ACUTE ONLY): 30 min  Charges:  $Therapeutic Activity: 8-22 mins $Neuromuscular Re-education: 8-22 mins                    Lelah Rennaker E, PT Acute Rehabilitation Services Pager [phone]  Office [phone]   Roxine Caddy D Elonda Husky 11/13/2019, 3:46 PM

## 2019-11-13 NOTE — Progress Notes (Signed)
S/O:  Called for worsening NIHSS from 16 to 28. The patient was admitted on 4/20 for acute bilateral patchy infarcts involving the left greater than right basal ganglia and corona radiata. Per RN and based on the notes, she has become less responsive and less able to follow commands with bilateral weakness, left worse than right - the notes and RN sign out information suggest that this may have been gradual throughout the day.   BP 132/67 (BP Location: Left Arm)   Pulse 72   Temp 99.7 F (37.6 C) (Axillary)   Resp 17   Ht 5\' 3"  (1.6 m)   Wt 58 kg   LMP 07/23/1998 (Approximate)   SpO2 94%   BMI 22.65 kg/m   HEENT:[place]/AT. Dried white material seen along lips and corner of her left mouth Lungs: Respirations unlabored. Sonorous quality to respirations.  Ext: No edema  Neuro: Ment: Nonverbal. Will follow some basic commands and fixate briefly on examiner's face. Somnolent to obtunded.  CN: PERRL. Eyes conjugate at the midline. No nystagmus. Does not track to left and right when asked. Briefly fixated on examiner's face. Left facial droop.  Motor/Sensory: LUE flaccid with no movement to noxious.  RUE: Able to lift antigravity. Weakly resists to command BLE: Not moving to command. Withdraws BLE to noxious, more briskly on the left.  Reflexes:  2+ bilateral brachioradialis.  Hypoactive patellae.  Cerebellar: Unable to assess   A/R: 74 year old female who [facility] to [facility] with acute strokes on 4/20. Code Stroke has been called for worsening. LKN (at her post-stroke baseline) was 1544 when she was given a Norco tablet and was alert and oriented.   1. Patient had been sleeping during shift change at 7 PM. Was able to briefly open her eyes when awakened. Later, during her scheduled assessment at 2220 by RN it was noted that her NIHSS was 28, somnolent and nonverbal, no movement of left side, significant weakness on the right.  2. STAT CTA of head and neck: No LVO per preliminary read by Radiology.   3. STAT CTP: No perfusion deficit per preliminary read by Radiology.  4. WBC 20.6. DDx for her worsening also includes infection. Aspiration is possible. CXR, repeat CBC, CMP, ammonia, blood culture x 2 and urinalysis have been ordered.  5. Overall clinical presentation is most consistent with AMS secondary to toxic/metabolic or infectious etiology.   35 minutes spent in the emergent neurological evaluation and management of this critically ill patient.   Electronically signed: Dr. Kerney Elbe

## 2019-11-13 NOTE — Progress Notes (Signed)
Inpatient Rehabilitation Admissions Coordinator  I met at bedside with patient who was up in chair sleeping and her Mom who was visiting. We discussed goals and expectations of an inpt rehab admit and that pt would need to progress further with therapies before I could pursue a possible CIR admit. We also discussed th possible need for SNF for prolonged recuperation. I will follow up on Monday .  Danne Baxter, RN, MSN Rehab Admissions Coordinator [phone] 11/13/2019 3:36 PM

## 2019-11-14 ENCOUNTER — Inpatient Hospital Stay (HOSPITAL_COMMUNITY): Payer: Medicare Other

## 2019-11-14 LAB — CBC
HCT: 41.7 % (ref 36.0–46.0)
Hemoglobin: 13.9 g/dL (ref 12.0–15.0)
MCH: 31.5 pg (ref 26.0–34.0)
MCHC: 33.3 g/dL (ref 30.0–36.0)
MCV: 94.6 fL (ref 80.0–100.0)
Platelets: 274 10*3/uL (ref 150–400)
RBC: 4.41 MIL/uL (ref 3.87–5.11)
RDW: 13.5 % (ref 11.5–15.5)
WBC: 16.5 10*3/uL — ABNORMAL HIGH (ref 4.0–10.5)
nRBC: 0 % (ref 0.0–0.2)

## 2019-11-14 LAB — COMPREHENSIVE METABOLIC PANEL
ALT: 33 U/L (ref 0–44)
AST: 19 U/L (ref 15–41)
Albumin: 2.5 g/dL — ABNORMAL LOW (ref 3.5–5.0)
Alkaline Phosphatase: 65 U/L (ref 38–126)
Anion gap: 11 (ref 5–15)
BUN: 18 mg/dL (ref 8–23)
CO2: 21 mmol/L — ABNORMAL LOW (ref 22–32)
Calcium: 9.1 mg/dL (ref 8.9–10.3)
Chloride: 102 mmol/L (ref 98–111)
Creatinine, Ser: 0.71 mg/dL (ref 0.44–1.00)
GFR calc Af Amer: >60 mL/min (ref >60)
GFR calc non Af Amer: >60 mL/min (ref >60)
Glucose, Bld: 119 mg/dL — ABNORMAL HIGH (ref 70–99)
Potassium: 3.8 mmol/L (ref 3.5–5.1)
Sodium: 134 mmol/L — ABNORMAL LOW (ref 135–145)
Total Bilirubin: 1.6 mg/dL — ABNORMAL HIGH (ref 0.3–1.2)
Total Protein: 5.9 g/dL — ABNORMAL LOW (ref 6.5–8.1)

## 2019-11-14 LAB — AMMONIA: Ammonia: 17 umol/L (ref 9–35)

## 2019-11-14 LAB — URINALYSIS, ROUTINE W REFLEX MICROSCOPIC
Bilirubin Urine: NEGATIVE
Glucose, UA: NEGATIVE mg/dL
Hgb urine dipstick: NEGATIVE
Ketones, ur: 5 mg/dL — AB
Leukocytes,Ua: NEGATIVE
Nitrite: NEGATIVE
Protein, ur: 30 mg/dL — AB
Specific Gravity, Urine: 1.044 — ABNORMAL HIGH (ref 1.005–1.030)
pH: 5 (ref 5.0–8.0)

## 2019-11-14 MED ORDER — ACETAMINOPHEN 325 MG PO TABS
650.0000 mg | ORAL_TABLET | Freq: Four times a day (QID) | ORAL | Status: DC | PRN
  Administered 2019-11-22 – 2019-11-24 (×2): 650 mg via ORAL
  Filled 2019-11-14 (×2): qty 2

## 2019-11-14 MED ORDER — ACETAMINOPHEN 650 MG RE SUPP
650.0000 mg | RECTAL | Status: DC | PRN
  Administered 2019-11-14: 650 mg via RECTAL
  Filled 2019-11-14: qty 1

## 2019-11-14 MED ORDER — SODIUM CHLORIDE 0.9 % IV SOLN: INTRAVENOUS | Status: DC

## 2019-11-14 NOTE — Significant Event (Addendum)
Rapid Response Event Note  Overview: Called d/t to worsening stroke symptoms and somnolence. Per RN, NIH went from 16 to 59. I asked RN to page neuro.  Initial Focused Assessment: Pt laying in bed with eyes closed. Pt has white secretions coming out of corner of mouth(?whether she could be aspirating). Pt will open eyes with repeated verbal stimulation but will not speak or follow commands. NIH-24, T-100.8, HR-70, BP-132/67, RR-20, SpO2-94% on RA. Skin warm and dry. En route to CT, pt became more awake and began to mouth words appropriately.   Interventions: Code Stroke activated-see code stroke note CBG-127 CT head STAT-no acute changes CTA-no LVO CTP-no perfusion deficit NPO Sepsis workup ordered by neuro Plan of Care (if not transferred): Keep NPO d/t evidence of possible aspiration. Continue to monitor pt. Call RRT if further assistance needed Event Summary:  [place] notified PTA RRT Called: 2225 Arrived: 2234 Ended:2330  Dillard Essex

## 2019-11-14 NOTE — Progress Notes (Signed)
Physical Therapy Treatment Patient Details Name: Lynn [name] MRN: [id number] DOB: 03/04/1946 Today's Date: 11/14/2019    History of Present Illness Pt is a 74 y/o female admitted secondary to L arm weakness and speech difficulty. MRI revealed Patchy acute infarcts involving the left greater than right basal ganglia and corona radiata. PMH includes HTN and depression.    PT Comments    Pt needed a bit of encouragement to get on board with treatment. Pt was a little lethargic.  Emphasis on warm up with stress on activating L side U and LE's, transitions to EOB, sitting balance and standing at EOB.   Follow Up Recommendations  CIR;Supervision/Assistance - 24 hour     Equipment Recommendations  Other (comment)(TBA)    Recommendations for Other Services       Precautions / Restrictions Precautions Precautions: Fall    Mobility  Bed Mobility Overal bed mobility: Needs Assistance Bed Mobility: Supine to Sit;Sit to Supine     Supine to sit: +2 for physical assistance;Total assist Sit to supine: Total assist;+2 for physical assistance      Transfers Overall transfer level: Needs assistance Equipment used: 2 person hand held assist(with chair back) Transfers: Sit to/from Stand Sit to Stand: Max assist;+2 physical assistance;+2 safety/equipment;From elevated surface         General transfer comment: 2 person assist at trunk to come forward and up.  pt needed support at both knees L > R LE  Ambulation/Gait             General Gait Details: unable this day   Stairs             Wheelchair Mobility    Modified Rankin (Stroke Patients Only) Modified Rankin (Stroke Patients Only) Pre-Morbid Rankin Score: No symptoms Modified Rankin: Severe disability     Balance Overall balance assessment: Needs assistance   Sitting balance-Leahy Scale: Poor Sitting balance - Comments: worked on w/shifts R to attain midline and then facilitation for upright  posture.  At EOB for 6-8 min.   Standing balance support: Bilateral upper extremity supported;During functional activity Standing balance-Leahy Scale: Zero                              Cognition Arousal/Alertness: Awake/alert Behavior During Therapy: WFL for tasks assessed/performed Overall Cognitive Status: Impaired/Different from baseline Area of Impairment: Attention;Following commands;Problem solving;Memory                   Current Attention Level: Sustained   Following Commands: Follows one step commands with increased time     Problem Solving: Slow processing;Decreased initiation;Difficulty sequencing;Requires verbal cues;Requires tactile cues        Exercises Other Exercises Other Exercises: warm up B LE ROM exercise.    General Comments General comments (skin integrity, edema, etc.): pt's Mom present      Pertinent Vitals/Pain Pain Assessment: Faces Faces Pain Scale: Hurts even more Pain Location: bilateral knees, during ROM Pain Descriptors / Indicators: Grimacing;Guarding;Discomfort;Other (Comment) Pain Intervention(s): Monitored during session    Home Living                      Prior Function            PT Goals (current goals can now be found in the care plan section) Acute Rehab PT Goals PT Goal Formulation: With patient Time For Goal Achievement: 11/25/19 Potential to Achieve Goals: Good Progress towards  PT goals: Progressing toward goals    Frequency    Min 4X/week      PT Plan Current plan remains appropriate    Co-evaluation              AM-PAC PT "6 Clicks" Mobility   Outcome Measure  Help needed turning from your back to your side while in a flat bed without using bedrails?: A Lot Help needed moving from lying on your back to sitting on the side of a flat bed without using bedrails?: Total Help needed moving to and from a bed to a chair (including a wheelchair)?: Total Help needed standing up  from a chair using your arms (e.g., wheelchair or bedside chair)?: Total Help needed to walk in hospital room?: Total Help needed climbing 3-5 steps with a railing? : Total 6 Click Score: 7    End of Session   Activity Tolerance: Patient limited by fatigue;Patient tolerated treatment well Patient left: in bed;with call bell/phone within reach;with bed alarm set;with family/visitor present Nurse Communication: Mobility status PT Visit Diagnosis: Unsteadiness on feet (R26.81);Hemiplegia and hemiparesis;Muscle weakness (generalized) (M62.81) Hemiplegia - Right/Left: Left Hemiplegia - dominant/non-dominant: Non-dominant Hemiplegia - caused by: Cerebral infarction     Time: [id number] PT Time Calculation (min) (ACUTE ONLY): 28 min  Charges:  $Therapeutic Activity: 8-22 mins $Neuromuscular Re-education: 8-22 mins                      11/14/2019  Ginger Carne., PT Acute Rehabilitation Services [phone]  (pager) [phone]  (office)   Lynn [name] 11/14/2019, 5:08 PM

## 2019-11-14 NOTE — Progress Notes (Addendum)
2222: Patient somnolent only responding to verbal stimuli, nonverbal, no movement noted on left side with severe weakness noted on right side. NIHSS increased from 16 to 28. RRT and neurology notified. Code stroke initiated by RRT. Stat CT, CTA, and CTP ordered by neurology.   2336: Patient rectal temp 100.8 f rectally. Hospitalist notified. Administered PRN acetaminophen rectally and started maintenance fluids per MD order. NPO status ordered due to nonverbal status and increased drooling. Patient resting comfortably. Will continue to monitor closely.

## 2019-11-14 NOTE — Progress Notes (Signed)
Lynn [name]  [id number] DOB: 1946/04/02 DOA: 11/10/2019 PCP: Marin Olp, MD    Brief Narrative:  [phone] with a history of HTN, HLD, and depression/anxiety who presented with dysarthria/expressive aphasia and confusion.  She also felt that her left arm was weak.  She has no prior history of CVA.  In the ED she was confirmedto have left arm weakness and slurred speech.  MRI confirmed bilateral CVAs.  Neurology was consulted and [facility] admitted the patient.  Significant Events: 4/20 admit via [place] for CVA 4/21 TTE EF 60-65% 4/22 diet advanced to dysphagia 1 with nectar thick liquids  Antimicrobials:  None  Subjective: The patient was noted to be somnolent for much of the day yesterday but last night at 22:22 it was felt that she had changed significantly with her being completely unable to respond to verbal stimuli and displaying no apparent movement of the left side.  Code stroke was called and the patient underwent STAT CTa and CTp which were fortunately not consistent with an acute event.  Is felt this was likely metabolic, infectious, or perhaps medication related.    Patient was previously noted to be alert and oriented at 15:44 when she was given a Norco tablet.  At the time of my evaluation yesterday (15:55) she was somnolent but able to be awakened and interactive with no new focal deficits appreciable.  She is presently afebrile with stable vital signs.  Oxygen saturations are 95% on room air.  At the time of my exam today she is actually more alert and interactive than I have seen her during his hospital stay.  Assessment & Plan:  Acute ischemic bilateral CVAs - L>R BG and CR New left-sided weakness, facial droop, and expressive aphasia -initial CT suggested a possible left basal ganglia infarct and CTa was negative for large vessel occlusion or severe stenosis -subsequent MRI noted bilateral infarcts of the basal ganglia and corona radiata and mild  small vessel disease - neurology directed her stroke evaluation - was taking aspirin 81 mg daily previously -acute strokes are felt to have been related to small vessel disease - A1c not elevated at 5.1 -LDL elevated and treatment begun - plan is to add Plavix to aspirin for 3 months then transition to Plavix only thereafter -given bilateral involvement neurology has recommended a 30-day cardiac event monitor as an outpatient to rule out atrial fibrillation -PT/OT are recommending CIR and the patient is interested  Altered mental status 4/23 Suspect this was related to 1 dose of Norco that she was given -repeat acute CVA work-up was unrevealing  Dysphagia Consequence of CVA -cleared for D2 diet with nectar thick liquids by SLP initially and now upgraded to dysphagia 1 with nectar thick liquids - hopeful for further advancement of diet with ongoing speech therapy  Possible cognitive deficit Will need outpatient follow-up for possible cognitive decline/memory disorder -family reports this was present prior to this event  HTN We have now reached the point where careful lowering/normalization of her blood pressure is appropriate -consider increasing Cardizem tomorrow if blood pressure trend continues  HLD LDL 115 - HDL 65 -Lipitor initiated -check LFTs in approximately 4-5 weeks  Hypokalemia Likely due to limited intake since admission -corrected with supplementation  Hyponatremia Likely due to simple mild dehydration -corrected with volume resuscitation  DVT prophylaxis: Lovenox Code Status: FULL CODE Family Communication:  Disposition Plan: Physical therapy recommends CIR -patient was previously living independently -plan is for discharge to CIR when insurance  authorization completed and bed available  Consultants:  none  Objective: Blood pressure (!) 154/79, pulse 75, temperature 97.6 F (36.4 C), resp. rate 17, height 5\' 3"  (1.6 m), weight 58 kg, last menstrual period 07/23/1998,  SpO2 95 %.  Intake/Output Summary (Last 24 hours) at 11/14/2019 0805 Last data filed at 11/14/2019 0419 Gross per 24 hour  Intake 619.8 ml  Output 900 ml  Net -280.2 ml   Filed Weights   11/10/19 1159  Weight: 58 kg    Examination: General: No acute respiratory distress Lungs: CTA B -no wheezing Cardiovascular: RRR without murmur Abdomen: NT/ND, soft Extremities: No significant edema B LE   CBC: Recent Labs  Lab 11/10/19 0951 11/10/19 0951 11/10/19 1244 11/12/19 0344 11/14/19 0057  WBC 10.0  --   --  20.6* 16.5*  NEUTROABS 7.5  --   --   --   --   HGB 15.7*   < > 15.0 15.4* 13.9  HCT 48.2*   < > 44.0 46.0 41.7  MCV 97.2  --   --  95.0 94.6  PLT 315  --   --  326 274   < > = values in this interval not displayed.   Basic Metabolic Panel: Recent Labs  Lab 11/12/19 0344 11/13/19 0332 11/14/19 0057  NA 133* 135 134*  K 3.2* 3.2* 3.8  CL 101 107 102  CO2 19* 20* 21*  GLUCOSE 126* 120* 119*  BUN 14 15 18   CREATININE 0.75 0.66 0.71  CALCIUM 8.9 8.7* 9.1  MG  --  1.8  --    GFR: Estimated Creatinine Clearance: 51 mL/min (by C-G formula based on SCr of 0.71 mg/dL).  Liver Function Tests: Recent Labs  Lab 11/10/19 0951 11/12/19 0344 11/14/19 0057  AST 24 19 19   ALT 28 22 33  ALKPHOS 77 78 65  BILITOT 1.1 1.4* 1.6*  PROT 6.3* 5.9* 5.9*  ALBUMIN 3.7 3.4* 2.5*    Coagulation Profile: Recent Labs  Lab 11/10/19 1230  INR 1.0    HbA1C: Hgb A1c MFr Bld  Date/Time Value Ref Range Status  11/11/2019 04:55 AM 5.1 4.8 - 5.6 % Final    Comment:    (NOTE) Pre diabetes:          5.7%-6.4% Diabetes:              >6.4% Glycemic control for   <7.0% adults with diabetes     CBG: Recent Labs  Lab 11/10/19 0950 11/13/19 2237  GLUCAP 76 127*    Recent Results (from the past 240 hour(s))  SARS CORONAVIRUS 2 (TAT 6-24 HRS) Nasopharyngeal Nasopharyngeal Swab     Status: None   Collection Time: 11/10/19  1:34 PM   Specimen: Nasopharyngeal Swab  Result  Value Ref Range Status   SARS Coronavirus 2 NEGATIVE NEGATIVE Final    Comment: (NOTE) SARS-CoV-2 target nucleic acids are NOT DETECTED. The SARS-CoV-2 RNA is generally detectable in upper and lower respiratory specimens during the acute phase of infection. Negative results do not preclude SARS-CoV-2 infection, do not rule out co-infections with other pathogens, and should not be used as the sole basis for treatment or other patient management decisions. Negative results must be combined with clinical observations, patient history, and epidemiological information. The expected result is Negative. Fact Sheet for Patients: SugarRoll.be Fact Sheet for Healthcare Providers: https://www.woods-mathews.com/ This test is not yet approved or cleared by the [place] FDA and  has been authorized for detection and/or diagnosis of SARS-CoV-2 by  FDA under an Emergency Use Authorization (EUA). This EUA will remain  in effect (meaning this test can be used) for the duration of the COVID-19 declaration under Section 56 4(b)(1) of the Act, 21 U.S.C. section 360bbb-3(b)(1), unless the authorization is terminated or revoked sooner. Performed at [facility], [place] [address], [place], [place] [address]      Scheduled Meds: . aspirin EC  81 mg Oral Daily  . atorvastatin  40 mg Oral Daily  . clopidogrel  75 mg Oral Daily  . diltiazem  30 mg Oral Q8H  . docusate sodium  100 mg Oral BID  . enoxaparin (LOVENOX) injection  40 mg Subcutaneous Q24H   Continuous Infusions: . sodium chloride 75 mL/hr at 11/14/19 0040     LOS: 4 days   Cherene Altes, MD [facility] Office  [phone] Pager - Text Page per Shea Evans  If 7PM-7AM, please contact night-coverage per Amion 11/14/2019, 8:05 AM

## 2019-11-14 NOTE — Code Documentation (Signed)
Called to room d/t worsening stroke symptoms. Pt very somnolent. NIH-11 this morning, now-24. CBG-127.  Code Stroke called at 2247 per Dr. Cheral Marker. CT head-no acute changes, CTA-no LVO, CTP-no perfusion deficit. Pt temp 100.8 and WBC elevated. Will keep pt NPO at this time d/t concern for aspiration with decreased LOC and pt drooling. Plan to do sepsis workup.

## 2019-11-15 DIAGNOSIS — R531 Weakness: Secondary | ICD-10-CM

## 2019-11-15 MED ORDER — BETHANECHOL CHLORIDE 10 MG PO TABS
10.0000 mg | ORAL_TABLET | Freq: Four times a day (QID) | ORAL | Status: AC
  Administered 2019-11-15 – 2019-11-17 (×10): 10 mg via ORAL
  Filled 2019-11-15 (×12): qty 1

## 2019-11-15 NOTE — Progress Notes (Signed)
Lynn [name]  [id number] DOB: 02-05-1946 DOA: 11/10/2019 PCP: Marin Olp, MD    Brief Narrative:  [phone] with a history of HTN, HLD, and depression/anxiety who presented with dysarthria/expressive aphasia and confusion.  She also felt that her left arm was weak.  She has no prior history of CVA.  In the ED she was confirmedto have left arm weakness and slurred speech.  MRI confirmed bilateral CVAs.  Neurology was consulted and [facility] admitted the patient.  Significant Events: 4/20 admit via [place] for CVA 4/21 TTE EF 60-65% 4/22 diet advanced to dysphagia 1 with nectar thick liquids  Antimicrobials:  None  Subjective: Appears comfortable in bed.  Denies chest pain or shortness of breath.  Is actually significantly more interactive today.  Speech appears more intelligible today.  Tells me she is motivated to participate in rehab.  Assessment & Plan:  Acute ischemic bilateral CVAs - L>R BG and CR New left-sided weakness, facial droop, and expressive aphasia -initial CT suggested a possible left basal ganglia infarct and CTa was negative for large vessel occlusion or severe stenosis -subsequent MRI noted bilateral infarcts of the basal ganglia and corona radiata and mild small vessel disease - neurology directed her stroke evaluation - was taking aspirin 81 mg daily previously -acute strokes are felt to have been related to small vessel disease - A1c not elevated at 5.1 -LDL elevated and treatment begun - plan is to add Plavix to aspirin for 3 months then transition to Plavix only thereafter -given bilateral involvement neurology has recommended a 30-day cardiac event monitor as an outpatient to rule out atrial fibrillation -PT/OT are recommending CIR and the patient is interested  Altered mental status 4/23 Suspect this was related to 1 dose of Norco that she was given -repeat acute CVA work-up was unrevealing -mental status dramatically improved today to  baseline  Dysphagia Consequence of CVA -cleared for D2 diet with nectar thick liquids by SLP initially and now upgraded to dysphagia 1 with nectar thick liquids - hopeful for further advancement of diet with ongoing speech therapy  Possible cognitive deficit Will need outpatient follow-up for possible cognitive decline/memory disorder -family reports this was present prior to this event  HTN We have now reached the point where careful lowering/normalization of her blood pressure is appropriate -continue current medication dose and without change at this time  HLD LDL 115 - HDL 65 -Lipitor initiated -check LFTs in approximately 4-5 weeks  Hypokalemia Likely due to limited intake since admission -corrected with supplementation  Hyponatremia Likely due to simple mild dehydration -corrected with volume resuscitation -stop IV fluid today  DVT prophylaxis: Lovenox Code Status: FULL CODE Family Communication:  Disposition Plan: Physical therapy recommends CIR -patient was previously living independently -plan is for discharge to CIR when insurance authorization completed and bed available  Consultants:  none  Objective: Blood pressure (!) 145/71, pulse 71, temperature 98.3 F (36.8 C), resp. rate 18, height 5\' 3"  (1.6 m), weight 58 kg, last menstrual period 07/23/1998, SpO2 96 %.  Intake/Output Summary (Last 24 hours) at 11/15/2019 0859 Last data filed at 11/15/2019 0537 Gross per 24 hour  Intake 1786.64 ml  Output 550 ml  Net 1236.64 ml   Filed Weights   11/10/19 1159  Weight: 58 kg    Examination: General: No acute respiratory distress -more alert and interactive -speech more clear Lungs: CTA B -no wheezing Cardiovascular: RRR without murmur Abdomen: NT/ND, soft Extremities: No significant edema B LE  CBC: Recent Labs  Lab 11/10/19 0951 11/10/19 0951 11/10/19 1244 11/12/19 0344 11/14/19 0057  WBC 10.0  --   --  20.6* 16.5*  NEUTROABS 7.5  --   --   --   --   HGB  15.7*   < > 15.0 15.4* 13.9  HCT 48.2*   < > 44.0 46.0 41.7  MCV 97.2  --   --  95.0 94.6  PLT 315  --   --  326 274   < > = values in this interval not displayed.   Basic Metabolic Panel: Recent Labs  Lab 11/12/19 0344 11/13/19 0332 11/14/19 0057  NA 133* 135 134*  K 3.2* 3.2* 3.8  CL 101 107 102  CO2 19* 20* 21*  GLUCOSE 126* 120* 119*  BUN 14 15 18   CREATININE 0.75 0.66 0.71  CALCIUM 8.9 8.7* 9.1  MG  --  1.8  --    GFR: Estimated Creatinine Clearance: 51 mL/min (by C-G formula based on SCr of 0.71 mg/dL).  Liver Function Tests: Recent Labs  Lab 11/10/19 0951 11/12/19 0344 11/14/19 0057  AST 24 19 19   ALT 28 22 33  ALKPHOS 77 78 65  BILITOT 1.1 1.4* 1.6*  PROT 6.3* 5.9* 5.9*  ALBUMIN 3.7 3.4* 2.5*    Coagulation Profile: Recent Labs  Lab 11/10/19 1230  INR 1.0    HbA1C: Hgb A1c MFr Bld  Date/Time Value Ref Range Status  11/11/2019 04:55 AM 5.1 4.8 - 5.6 % Final    Comment:    (NOTE) Pre diabetes:          5.7%-6.4% Diabetes:              >6.4% Glycemic control for   <7.0% adults with diabetes     CBG: Recent Labs  Lab 11/10/19 0950 11/13/19 2237  GLUCAP 76 127*    Recent Results (from the past 240 hour(s))  SARS CORONAVIRUS 2 (TAT 6-24 HRS) Nasopharyngeal Nasopharyngeal Swab     Status: None   Collection Time: 11/10/19  1:34 PM   Specimen: Nasopharyngeal Swab  Result Value Ref Range Status   SARS Coronavirus 2 NEGATIVE NEGATIVE Final    Comment: (NOTE) SARS-CoV-2 target nucleic acids are NOT DETECTED. The SARS-CoV-2 RNA is generally detectable in upper and lower respiratory specimens during the acute phase of infection. Negative results do not preclude SARS-CoV-2 infection, do not rule out co-infections with other pathogens, and should not be used as the sole basis for treatment or other patient management decisions. Negative results must be combined with clinical observations, patient history, and epidemiological information. The  expected result is Negative. Fact Sheet for Patients: SugarRoll.be Fact Sheet for Healthcare Providers: https://www.woods-mathews.com/ This test is not yet approved or cleared by the [place] FDA and  has been authorized for detection and/or diagnosis of SARS-CoV-2 by FDA under an Emergency Use Authorization (EUA). This EUA will remain  in effect (meaning this test can be used) for the duration of the COVID-19 declaration under Section 56 4(b)(1) of the Act, 21 U.S.C. section 360bbb-3(b)(1), unless the authorization is terminated or revoked sooner. Performed at [facility], [place] [address], [place], [place] [address]      Scheduled Meds: . aspirin EC  81 mg Oral Daily  . atorvastatin  40 mg Oral Daily  . clopidogrel  75 mg Oral Daily  . diltiazem  30 mg Oral Q8H  . docusate sodium  100 mg Oral BID  . enoxaparin (LOVENOX) injection  40 mg Subcutaneous Q24H   Continuous Infusions: . sodium chloride 50 mL/hr at 11/15/19 0537     LOS: 5 days   Cherene Altes, MD [facility] Office  [phone] Pager - Text Page per Shea Evans  If 7PM-7AM, please contact night-coverage per Amion 11/15/2019, 8:59 AM

## 2019-11-15 NOTE — Progress Notes (Signed)
Paged cvg MD [facility] Rm 2w29 Alleene Robart, bladder scan 419, last void yest 4am w/ straight cath, can you put in a new order for in&out cath? Thanks!

## 2019-11-16 LAB — BASIC METABOLIC PANEL
Anion gap: 8 (ref 5–15)
BUN: 18 mg/dL (ref 8–23)
CO2: 23 mmol/L (ref 22–32)
Calcium: 8.4 mg/dL — ABNORMAL LOW (ref 8.9–10.3)
Chloride: 106 mmol/L (ref 98–111)
Creatinine, Ser: 0.61 mg/dL (ref 0.44–1.00)
GFR calc Af Amer: >60 mL/min (ref >60)
GFR calc non Af Amer: >60 mL/min (ref >60)
Glucose, Bld: 102 mg/dL — ABNORMAL HIGH (ref 70–99)
Potassium: 2.9 mmol/L — ABNORMAL LOW (ref 3.5–5.1)
Sodium: 137 mmol/L (ref 135–145)

## 2019-11-16 LAB — MAGNESIUM: Magnesium: 1.9 mg/dL (ref 1.7–2.4)

## 2019-11-16 LAB — GLUCOSE, CAPILLARY: Glucose-Capillary: 69 mg/dL — ABNORMAL LOW (ref 70–99)

## 2019-11-16 MED ORDER — DILTIAZEM HCL ER COATED BEADS 120 MG PO CP24
120.0000 mg | ORAL_CAPSULE | Freq: Every day | ORAL | Status: DC
  Administered 2019-11-17: 120 mg via ORAL
  Filled 2019-11-16: qty 1

## 2019-11-16 MED ORDER — POTASSIUM CHLORIDE CRYS ER 20 MEQ PO TBCR
40.0000 meq | EXTENDED_RELEASE_TABLET | Freq: Three times a day (TID) | ORAL | Status: AC
  Administered 2019-11-16 – 2019-11-17 (×6): 40 meq via ORAL
  Filled 2019-11-16 (×7): qty 2

## 2019-11-16 NOTE — Progress Notes (Signed)
Occupational Therapy Treatment Patient Details Name: Lynn [name] MRN: [id number] DOB: [birth year] Today's Date: 11/16/2019    History of present illness Pt is a 74 y/o female admitted secondary to L arm weakness and speech difficulty. MRI revealed Patchy acute infarcts involving the left greater than right basal ganglia and corona radiata. PMH includes HTN and depression.   OT comments  Pt progressing towards OT goals this session. Focus on seated balance at EOB for  Grooming tasks and crossing midline for interaction with LUE. Pt with flat affect, but engaged in every task that therapy attempted. Pt total A +2 for squat pivot to recliner (drop arm). Pt also processing education as she was performing head turns to left at end of session. Due to PLOF and good support from brother and mother, continue to recommend CIR level therapy post-acute. RN notified of pain in RLE with movement (knee and calf muscle).   Follow Up Recommendations  CIR    Equipment Recommendations  Other (comment)(defer to next venue of care)    Recommendations for Other Services      Precautions / Restrictions Precautions Precautions: Fall Restrictions Weight Bearing Restrictions: No       Mobility Bed Mobility Overal bed mobility: Needs Assistance Bed Mobility: Supine to Sit;Sit to Supine;Rolling Rolling: Total assist;+2 for physical assistance(to left)   Supine to sit: +2 for physical assistance;Total assist Sit to supine: Total assist;+2 for physical assistance   General bed mobility comments: total assist +2 for trunk and LE management and scoot to EOB;  pt cued for sequencing to look to L and reach before roll and trunk elevation to sitting  Transfers Overall transfer level: Needs assistance Equipment used: 2 person hand held assist Transfers: Squat Pivot Transfers;Sit to/from Stand Sit to Stand: Total assist;+2 physical assistance   Squat pivot transfers: +2 physical assistance;Total  assist;From elevated surface     General transfer comment: Performed 3 partial sit to stand from chair with knees blocked, pt holding therapist, and facilitation for forward trunk lean.  Pt was total assist scoot to chair toward R with knees blocked, facilitation for trunk lean, and use of bed pad to scoot    Balance Overall balance assessment: Needs assistance Sitting-balance support: Feet supported;Bilateral upper extremity supported Sitting balance-Leahy Scale: Poor Sitting balance - Comments: Worked on upright posture at EOB during ADLs with OT and prior to transfer.  Pt requiring mod A for balance and could only hold 1-2 seconds without support.  Gave cues to bring head forward and hold with R hand at EOB or on therapist.  Tending to lean posterior.   Standing balance support: Bilateral upper extremity supported;During functional activity Standing balance-Leahy Scale: Zero Standing balance comment: squat pivot only this day                           ADL either performed or assessed with clinical judgement   ADL Overall ADL's : Needs assistance/impaired Eating/Feeding: Moderate assistance;Sitting   Grooming: Brushing hair;Maximal assistance;Sitting;Wash/dry face Grooming Details (indicate cue type and reason): able to brush hair with left hands, but required assist for thoroughness                 Toilet Transfer: Total assistance;+2 for physical assistance;+2 for safety/equipment;Squat-pivot;BSC Toilet Transfer Details (indicate cue type and reason): simulated to chair, drop arm recliner used and gait belt Toileting- Clothing Manipulation and Hygiene: Total assistance;Bed level         General  ADL Comments: Practiced reaching across and bringing Left hand into lap and massaging fingers as well as cueing to visually track to the left side of body/room     Vision       Perception     Praxis      Cognition Arousal/Alertness: Awake/alert Behavior During  Therapy: Flat affect Overall Cognitive Status: Impaired/Different from baseline Area of Impairment: Attention;Following commands;Problem solving;Memory                   Current Attention Level: Sustained Memory: Decreased recall of precautions Following Commands: Follows one step commands with increased time     Problem Solving: Slow processing;Decreased initiation;Difficulty sequencing;Requires verbal cues;Requires tactile cues General Comments: Pt with L neglect/inattention - can look to L and move L hand using R with cues        Exercises Exercises: Other exercises General Exercises - Lower Extremity Ankle Circles/Pumps: AAROM;Both;5 reps;Seated Long Arc Quad: AAROM;Both;5 reps;Seated(Pt with minimal contractions (more on R than L); grimaces on R with movement) Other Exercises Other Exercises: massage of L hand for edema management Other Exercises: basic PROM for LUE for edema management   Shoulder Instructions       General Comments Pt educated on looking L and neck ROM.  Provided cues throughout treatment for facilitation, use of all extremities, and for L inattention.  Pt positioned in reclined position with pillows to support posture, L arm, and under both legs.  Mother present at end of seesion.    Pertinent Vitals/ Pain       Pain Assessment: Faces Faces Pain Scale: Hurts little more Pain Location: R toes with any R LE movement Pain Descriptors / Indicators: Grimacing Pain Intervention(s): Monitored during session;Repositioned;Relaxation  Home Living                                          Prior Functioning/Environment              Frequency  Min 3X/week        Progress Toward Goals  OT Goals(current goals can now be found in the care plan section)  Progress towards OT goals: Progressing toward goals  Acute Rehab OT Goals Patient Stated Goal: to be independent OT Goal Formulation: With patient/family Time For Goal  Achievement: 11/26/19 Potential to Achieve Goals: Good  Plan Discharge plan remains appropriate;Frequency remains appropriate    Co-evaluation    PT/OT/SLP Co-Evaluation/Treatment: Yes Reason for Co-Treatment: Complexity of the patient's impairments (multi-system involvement);For patient/therapist safety;To address functional/ADL transfers PT goals addressed during session: Mobility/safety with mobility;Balance OT goals addressed during session: ADL's and self-care;Strengthening/ROM      AM-PAC OT "6 Clicks" Daily Activity     Outcome Measure   Help from another person eating meals?: A Lot Help from another person taking care of personal grooming?: A Lot Help from another person toileting, which includes using toliet, bedpan, or urinal?: Total Help from another person bathing (including washing, rinsing, drying)?: A Lot Help from another person to put on and taking off regular upper body clothing?: A Lot Help from another person to put on and taking off regular lower body clothing?: Total 6 Click Score: 10    End of Session Equipment Utilized During Treatment: Gait belt  OT Visit Diagnosis: Muscle weakness (generalized) (M62.81);Hemiplegia and hemiparesis;Pain;Other symptoms and signs involving cognitive function Hemiplegia - Right/Left: Left Hemiplegia - dominant/non-dominant: Non-Dominant Hemiplegia -  caused by: Cerebral infarction Pain - Right/Left: Left Pain - part of body: Knee   Activity Tolerance Patient tolerated treatment well   Patient Left in chair;with call bell/phone within reach;with chair alarm set;with family/visitor present   Nurse Communication Mobility status        Time: 1000-1033 OT Time Calculation (min): 33 min  Charges: OT General Charges $OT Visit: 1 Visit OT Treatments $Self Care/Home Management : 8-22 mins  Jesse Sans OTR/L Acute Rehabilitation Services Pager: [phone] Office: [place] 11/16/2019, 11:44  AM

## 2019-11-16 NOTE — Progress Notes (Signed)
Physical Therapy Treatment Patient Details Name: Lynn [name] MRN: [id number] DOB: [birth year] Today's Date: 11/16/2019    History of Present Illness Pt is a 74 y/o female admitted secondary to L arm weakness and speech difficulty. MRI revealed Patchy acute infarcts involving the left greater than right basal ganglia and corona radiata. PMH includes HTN and depression.    PT Comments    Pt demonstrating gradual progress.  She had c/o pain in R toes with all R LE movement today that limited participation with exercises.  Pt was able to participate with EOB sitting, partial sit to stands, and transfer to chair.  Required assist of 2, facilitation, focus on balance and core strength, and cues for sequencing.  Will cont to benefit from PT services.     Follow Up Recommendations  CIR;Supervision/Assistance - 24 hour     Equipment Recommendations  Other (comment)(TBA next venue)    Recommendations for Other Services       Precautions / Restrictions Precautions Precautions: Fall Restrictions Weight Bearing Restrictions: No    Mobility  Bed Mobility Overal bed mobility: Needs Assistance Bed Mobility: Supine to Sit;Sit to Supine;Rolling Rolling: Total assist;+2 for physical assistance(to left)   Supine to sit: +2 for physical assistance;Total assist     General bed mobility comments: total assist +2 for trunk and LE management and scoot to EOB;  pt cued for sequencing to look to L and reach before roll and trunk elevation to sitting  Transfers Overall transfer level: Needs assistance Equipment used: 2 person hand held assist Transfers: Squat Pivot Transfers;Sit to/from Stand Sit to Stand: Total assist;+2 physical assistance   Squat pivot transfers: +2 physical assistance;Total assist;From elevated surface     General transfer comment: Performed 3 partial sit to stand from chair with knees blocked, pt holding therapist, and facilitation for forward trunk lean.  Pt was  total assist scoot to chair toward R with knees blocked, facilitation for trunk lean, and use of bed pad to scoot  Ambulation/Gait                 Stairs             Wheelchair Mobility    Modified Rankin (Stroke Patients Only) Modified Rankin (Stroke Patients Only) Pre-Morbid Rankin Score: No symptoms Modified Rankin: Severe disability     Balance Overall balance assessment: Needs assistance Sitting-balance support: Feet supported;Bilateral upper extremity supported Sitting balance-Leahy Scale: Poor Sitting balance - Comments: Worked on upright posture at EOB during ADLs with OT and prior to transfer.  Pt requiring mod A for balance and could only hold 1-2 seconds without support.  Gave cues to bring head forward and hold with R hand at EOB or on therapist.  Tending to lean posterior.                                    Cognition Arousal/Alertness: Awake/alert Behavior During Therapy: WFL for tasks assessed/performed Overall Cognitive Status: Impaired/Different from baseline                         Following Commands: Follows one step commands with increased time     Problem Solving: Slow processing;Decreased initiation;Difficulty sequencing;Requires verbal cues;Requires tactile cues General Comments: Pt with L neglect/inattention - can look to L and move L hand using R with cues      Exercises General Exercises - Lower  Extremity Ankle Circles/Pumps: AAROM;Both;5 reps;Seated Long Arc Quad: AAROM;Both;5 reps;Seated(Pt with minimal contractions (more on R than L); grimaces on R with movement)    General Comments General comments (skin integrity, edema, etc.): Pt educated on looking L and neck ROM.  Provided cues throughout treatment for facilitation, use of all extremities, and for L inattention.  Pt positioned in reclined position with pillows to support posture, L arm, and under both legs.  Mother present at end of seesion.       Pertinent Vitals/Pain Pain Assessment: Faces Faces Pain Scale: Hurts little more Pain Location: R toes with any R LE movement Pain Descriptors / Indicators: Grimacing Pain Intervention(s): Limited activity within patient's tolerance;Monitored during session;Repositioned;Relaxation    Home Living                      Prior Function            PT Goals (current goals can now be found in the care plan section) Progress towards PT goals: Progressing toward goals    Frequency    Min 4X/week      PT Plan Current plan remains appropriate    Co-evaluation PT/OT/SLP Co-Evaluation/Treatment: Yes Reason for Co-Treatment: Complexity of the patient's impairments (multi-system involvement);For patient/therapist safety PT goals addressed during session: Mobility/safety with mobility;Balance OT goals addressed during session: Strengthening/ROM;ADL's and self-care      AM-PAC PT "6 Clicks" Mobility   Outcome Measure  Help needed turning from your back to your side while in a flat bed without using bedrails?: A Lot Help needed moving from lying on your back to sitting on the side of a flat bed without using bedrails?: Total Help needed moving to and from a bed to a chair (including a wheelchair)?: Total Help needed standing up from a chair using your arms (e.g., wheelchair or bedside chair)?: Total Help needed to walk in hospital room?: Total Help needed climbing 3-5 steps with a railing? : Total 6 Click Score: 7    End of Session Equipment Utilized During Treatment: Gait belt Activity Tolerance: Patient tolerated treatment well Patient left: with call bell/phone within reach;with family/visitor present;with chair alarm set;in chair Nurse Communication: Mobility status;Precautions;Other (comment)(scoot transfer to R with drop arm down and use of gait belt; assist of 2) PT Visit Diagnosis: Unsteadiness on feet (R26.81);Hemiplegia and hemiparesis;Muscle weakness  (generalized) (M62.81) Hemiplegia - Right/Left: Left Hemiplegia - dominant/non-dominant: Non-dominant Hemiplegia - caused by: Cerebral infarction     Time: 1000-1033 PT Time Calculation (min) (ACUTE ONLY): 33 min  Charges:  $Therapeutic Activity: 8-22 mins                     Lynn [name], PT Acute Rehab Services Pager [phone] [facility] [phone]    Lynn [name] 11/16/2019, 10:57 AM

## 2019-11-16 NOTE — Progress Notes (Signed)
Lynn [name]  [id number] DOB: 04-10-1946 DOA: 11/10/2019 PCP: Marin Olp, MD    Brief Narrative:  [phone] with a history of HTN, HLD, and depression/anxiety who presented with dysarthria/expressive aphasia and confusion.  She also felt that her left arm was weak.  She has no prior history of CVA.  In the ED she was confirmedto have left arm weakness and slurred speech.  MRI confirmed bilateral CVAs.  Neurology was consulted and [facility] admitted the patient.  Significant Events: 4/20 admit via [place] [facility] for CVA 4/21 TTE EF 60-65% 4/22 diet advanced to dysphagia 1 with nectar thick liquids  Antimicrobials:  None  Subjective: Appears to be resting comfortably in bed.  On my exam her speech appears to have improved somewhat over the last 48 hours.  She is more awake today and interactive.  She denies chest pain shortness of breath nausea or vomiting.  She did develop urinary retention yesterday which given her immobility is not terribly surprising.  Foley catheter had to be placed and a course of Urecholine was initiated.  Intention is to discontinue her Foley catheter 4/27 and assess for free urination.  Assessment & Plan:  Acute ischemic bilateral CVAs - L>R BG and CR New left-sided weakness, facial droop, and expressive aphasia -initial CT suggested a possible left basal ganglia infarct and CTa was negative for large vessel occlusion or severe stenosis -subsequent MRI noted bilateral infarcts of the basal ganglia and corona radiata and mild small vessel disease - neurology directed her stroke evaluation - was taking aspirin 81 mg daily previously -acute strokes are felt to have been related to small vessel disease - A1c not elevated at 5.1 -LDL elevated and treatment begun - plan is to add Plavix to aspirin for 3 months then transition to Plavix only thereafter -given bilateral involvement neurology has recommended a 30-day cardiac event monitor as an outpatient to rule  out atrial fibrillation -PT/OT are recommending CIR and the patient is interested -awaiting acceptance/bed on CIR  Altered mental status 4/23 Suspect this was related to 1 dose of Norco that she was given -repeat acute CVA work-up was unrevealing -mental status dramatically improved and has stabilized over the last 48 hours  Dysphagia Consequence of CVA -cleared for D2 diet with nectar thick liquids by SLP initially and now upgraded to dysphagia 1 with nectar thick liquids - hopeful for further advancement of diet with ongoing speech therapy -speech improving  Possible cognitive deficit Will need outpatient follow-up for possible cognitive decline/memory disorder -family reports this was present prior to this event  HTN We have now reached the point where careful lowering/normalization of her blood pressure is appropriate -increase Cardizem today and follow  HLD LDL 115 - HDL 65 -Lipitor initiated -check LFTs in approximately 4-5 weeks  Hypokalemia Likely due to limited intake since admission -recurring -continue to supplement and follow -magnesium is normal  Hyponatremia Likely due to simple mild dehydration -corrected with volume resuscitation  Urinary retention Foley catheter placed 4/25 -Urecholine initiated -discontinue Foley 4/27 for voiding trial  DVT prophylaxis: Lovenox Code Status: FULL CODE Family Communication:  Disposition Plan: Physical therapy recommends CIR -patient was previously living independently -plan is for discharge to CIR when insurance authorization completed and bed available -if patient is not accepted to CIR or SNF will be our backup plan  Consultants:  none  Objective: Blood pressure (!) 160/76, pulse 68, temperature 97.9 F (36.6 C), temperature source Oral, resp. rate 20, height 5\' 3"  (1.6  m), weight 58 kg, last menstrual period 07/23/1998, SpO2 95 %.  Intake/Output Summary (Last 24 hours) at 11/16/2019 0917 Last data filed at 11/16/2019  0400 Gross per 24 hour  Intake 150 ml  Output 1100 ml  Net -950 ml   Filed Weights   11/10/19 1159  Weight: 58 kg    Examination: General: No acute respiratory distress -alert and interactive Lungs: CTA B without wheeze or crackles Cardiovascular: RRR -no rub Abdomen: NT/ND, soft Extremities: No edema B LE   CBC: Recent Labs  Lab 11/10/19 0951 11/10/19 0951 11/10/19 1244 11/12/19 0344 11/14/19 0057  WBC 10.0  --   --  20.6* 16.5*  NEUTROABS 7.5  --   --   --   --   HGB 15.7*   < > 15.0 15.4* 13.9  HCT 48.2*   < > 44.0 46.0 41.7  MCV 97.2  --   --  95.0 94.6  PLT 315  --   --  326 274   < > = values in this interval not displayed.   Basic Metabolic Panel: Recent Labs  Lab 11/13/19 0332 11/14/19 0057 11/16/19 0523  NA 135 134* 137  K 3.2* 3.8 2.9*  CL 107 102 106  CO2 20* 21* 23  GLUCOSE 120* 119* 102*  BUN 15 18 18   CREATININE 0.66 0.71 0.61  CALCIUM 8.7* 9.1 8.4*  MG 1.8  --  1.9   GFR: Estimated Creatinine Clearance: 51 mL/min (by C-G formula based on SCr of 0.61 mg/dL).  Liver Function Tests: Recent Labs  Lab 11/10/19 0951 11/12/19 0344 11/14/19 0057  AST 24 19 19   ALT 28 22 33  ALKPHOS 77 78 65  BILITOT 1.1 1.4* 1.6*  PROT 6.3* 5.9* 5.9*  ALBUMIN 3.7 3.4* 2.5*    Coagulation Profile: Recent Labs  Lab 11/10/19 1230  INR 1.0    HbA1C: Hgb A1c MFr Bld  Date/Time Value Ref Range Status  11/11/2019 04:55 AM 5.1 4.8 - 5.6 % Final    Comment:    (NOTE) Pre diabetes:          5.7%-6.4% Diabetes:              >6.4% Glycemic control for   <7.0% adults with diabetes     CBG: Recent Labs  Lab 11/10/19 0950 11/13/19 2237  GLUCAP 76 127*    Recent Results (from the past 240 hour(s))  SARS CORONAVIRUS 2 (TAT 6-24 HRS) Nasopharyngeal Nasopharyngeal Swab     Status: None   Collection Time: 11/10/19  1:34 PM   Specimen: Nasopharyngeal Swab  Result Value Ref Range Status   SARS Coronavirus 2 NEGATIVE NEGATIVE Final    Comment:  (NOTE) SARS-CoV-2 target nucleic acids are NOT DETECTED. The SARS-CoV-2 RNA is generally detectable in upper and lower respiratory specimens during the acute phase of infection. Negative results do not preclude SARS-CoV-2 infection, do not rule out co-infections with other pathogens, and should not be used as the sole basis for treatment or other patient management decisions. Negative results must be combined with clinical observations, patient history, and epidemiological information. The expected result is Negative. Fact Sheet for Patients: SugarRoll.be Fact Sheet for Healthcare Providers: https://www.woods-mathews.com/ This test is not yet approved or cleared by the [place] FDA and  has been authorized for detection and/or diagnosis of SARS-CoV-2 by FDA under an Emergency Use Authorization (EUA). This EUA will remain  in effect (meaning this test can be used) for the duration of the COVID-19 declaration  under Section 56 4(b)(1) of the Act, 21 U.S.C. section 360bbb-3(b)(1), unless the authorization is terminated or revoked sooner. Performed at [facility], [place] [address] [place], [place] [address]   Culture, blood (routine x 2)     Status: None (Preliminary result)   Collection Time: 11/14/19 12:57 AM   Specimen: BLOOD  Result Value Ref Range Status   Specimen Description BLOOD LEFT ANTECUBITAL  Final   Special Requests   Final    BOTTLES DRAWN AEROBIC AND ANAEROBIC Blood Culture results may not be optimal due to an excessive volume of blood received in culture bottles   Culture   Final    NO GROWTH 1 DAY Performed at [facility], [place] [address] [place], [place] [address]    Report Status PENDING  Incomplete  Culture, blood (routine x 2)     Status: None (Preliminary result)   Collection Time: 11/14/19  1:00 AM   Specimen: BLOOD  Result Value Ref Range Status   Specimen Description BLOOD LEFT ANTECUBITAL  Final    Special Requests   Final    BOTTLES DRAWN AEROBIC AND ANAEROBIC Blood Culture results may not be optimal due to an excessive volume of blood received in culture bottles   Culture   Final    NO GROWTH 1 DAY Performed at [facility], [place] [address], [place], [place] [address]    Report Status PENDING  Incomplete     Scheduled Meds: . aspirin EC  81 mg Oral Daily  . atorvastatin  40 mg Oral Daily  . bethanechol  10 mg Oral QID  . clopidogrel  75 mg Oral Daily  . diltiazem  30 mg Oral Q8H  . docusate sodium  100 mg Oral BID  . enoxaparin (LOVENOX) injection  40 mg Subcutaneous Q24H       LOS: 6 days   Cherene Altes, MD [facility] Office  [phone] Pager - Text Page per Shea Evans  If 7PM-7AM, please contact night-coverage per Amion 11/16/2019, 9:17 AM

## 2019-11-16 NOTE — Progress Notes (Signed)
Inpatient Rehabilitation Admissions Coordinator  Patient more alert . I will follow her progress, but patient may need SNF level rehab for prolonged recovery before eventual d/c home.  Danne Baxter, RN, MSN Rehab Admissions Coordinator [phone] 11/16/2019 1:39 PM

## 2019-11-17 DIAGNOSIS — R1319 Other dysphagia: Secondary | ICD-10-CM

## 2019-11-17 DIAGNOSIS — R338 Other retention of urine: Secondary | ICD-10-CM

## 2019-11-17 LAB — BASIC METABOLIC PANEL
Anion gap: 9 (ref 5–15)
BUN: 18 mg/dL (ref 8–23)
CO2: 20 mmol/L — ABNORMAL LOW (ref 22–32)
Calcium: 8.8 mg/dL — ABNORMAL LOW (ref 8.9–10.3)
Chloride: 106 mmol/L (ref 98–111)
Creatinine, Ser: 0.64 mg/dL (ref 0.44–1.00)
GFR calc Af Amer: >60 mL/min (ref >60)
GFR calc non Af Amer: >60 mL/min (ref >60)
Glucose, Bld: 106 mg/dL — ABNORMAL HIGH (ref 70–99)
Potassium: 3.8 mmol/L (ref 3.5–5.1)
Sodium: 135 mmol/L (ref 135–145)

## 2019-11-17 MED ORDER — SENNOSIDES-DOCUSATE SODIUM 8.6-50 MG PO TABS
1.0000 | ORAL_TABLET | Freq: Two times a day (BID) | ORAL | Status: DC
  Administered 2019-11-17 – 2019-11-24 (×14): 1 via ORAL
  Filled 2019-11-17 (×14): qty 1

## 2019-11-17 MED ORDER — BISACODYL 10 MG RE SUPP
10.0000 mg | Freq: Every day | RECTAL | Status: AC
  Administered 2019-11-17: 10 mg via RECTAL
  Filled 2019-11-17 (×2): qty 1

## 2019-11-17 MED ORDER — POLYETHYLENE GLYCOL 3350 17 G PO PACK
17.0000 g | PACK | Freq: Every day | ORAL | Status: DC
  Filled 2019-11-17: qty 1

## 2019-11-17 NOTE — Progress Notes (Signed)
PROGRESS NOTE  NAYDA RIESEN ZOX:[id number] DOB: July 17, 1946 DOA: 11/10/2019 PCP: Marin Olp, MD  Brief Narrative:  [phone] with a history of HTN, HLD, and depression/anxiety who presented with dysarthria/expressive aphasia and confusion.  She also felt that her left arm was weak.  She has no prior history of CVA.  In the ED she was confirmedto have left arm weakness and slurred speech.  MRI confirmed bilateral CVAs.  Neurology was consulted and [facility] admitted the patient.   Significant Events: 4/20 admit via [place] for CVA 4/21 TTE EF 60-65% 4/22 diet advanced to dysphagia 1 with nectar thick liquids  Antimicrobials:  None  HPI/Recap of past 24 hours:  She is very weak, denies pain, no hypoxia She reports being constipated   Assessment/Plan: Principal Problem:   CVA (cerebral vascular accident) ([place]) Active Problems:   Hypertension   Hyperlipidemia   Depression   Hypokalemia   Leukocytosis   Acute ischemic bilateral CVAs - L>R BG and CR New left-sided weakness, facial droop, and expressive aphasia -initial CT suggested a possible left basal ganglia infarct and CTa was negative for large vessel occlusion or severe stenosis -subsequent MRI noted bilateral infarcts of the basal ganglia and corona radiata and mild small vessel disease - neurology directed her stroke evaluation - was taking aspirin 81 mg daily previously -acute strokes are felt to have been related to small vessel disease - A1c not elevated at 5.1 -LDL elevated and treatment begun - plan is to add Plavix to aspirin for 3 months then transition to Plavix only thereafter -given bilateral involvement neurology has recommended a 30-day cardiac event monitor as an outpatient to rule out atrial fibrillation -PT/OT are recommending CIR and the patient is interested CIR recommend SNF level care for prolonged recovery, social worker consulted   Altered mental status 4/23 Suspect this was related to  1 dose of Norco that she was given -repeat acute CVA work-up was unrevealing -mental status dramatically improved  She is AAOx3 and oriented to situation this morning  Possible cognitive deficit Will need outpatient follow-up for possible cognitive decline/memory disorder -family reports this was present prior to this event  Dysphagia Consequence of CVA -cleared for D2 diet with nectar thick liquids by SLP initially and now upgraded to dysphagia 1 with nectar thick liquids - hopeful for further advancement of diet with ongoing speech therapy -speech improving  Acute urinary retention -Likely combination of neurogenic bladder, immobility, constipation -Treat reversible causes -Foley catheter placed 4/25 -Urecholine initiated , increase mobility, treat constipation, voiding trial in 24 hours to 48 hours   Leukocytosis; no fever.  Does not appear septic Blood culture no growth cxr: 1. Low lung volumes with bibasilar subsegmental atelectasis. 2. Aortic atherosclerosis repeat CBC in the morning, monitor  Hyponatremia and hypokalemia Replaced, normalized Repeat lab in the morning  DVT Prophylaxis: Lovenox subcu 40 mg  Code Status: Full  Family Communication: patient   Disposition Plan:    Patient came from:                        Home  Anticipated d/c place:  SNF  Barriers to d/c OR conditions which need to be met to effect a safe d/c:  Needs SNF placement   Consultants:  CIR  Neurology  Procedures:  Foley insertion April 25  Antibiotics:  None   Objective: BP (!) 160/86 (BP Location: Left Arm)   Pulse 71   Temp 97.6 F (36.4 C)   Resp 16   Ht 5' 3"  (1.6 m)   Wt 58 kg   LMP 07/23/1998 (Approximate)   SpO2 95%   BMI 22.65 kg/m   Intake/Output Summary (Last 24 hours) at 11/17/2019 1018 Last data filed at 11/16/2019 1839 Gross per 24 hour  Intake --  Output 500 ml  Net -500  ml   Filed Weights   11/10/19 1159  Weight: 58 kg    Exam: Patient is examined daily including today on 11/17/2019, exams remain the same as of yesterday except that has changed    General: Very weak but NAD,   Cardiovascular: RRR  Respiratory: Diminished at bases, poor respiratory effort, no wheezing, no rales, no rhonchi  Abdomen: Soft/ND/NT, positive BS  Musculoskeletal: No Edema  Neuro: alert, oriented x3, able to move her right upper extremity better than left upper extremity, bilateral lower extremity not able to lift against gravity, bilateral foot drop right more than left  Data Reviewed: Basic Metabolic Panel: Recent Labs  Lab 11/12/19 0344 11/13/19 0332 11/14/19 0057 11/16/19 0523 11/17/19 0204  NA 133* 135 134* 137 135  K 3.2* 3.2* 3.8 2.9* 3.8  CL 101 107 102 106 106  CO2 19* 20* 21* 23 20*  GLUCOSE 126* 120* 119* 102* 106*  BUN 14 15 18 18 18   CREATININE 0.75 0.66 0.71 0.61 0.64  CALCIUM 8.9 8.7* 9.1 8.4* 8.8*  MG  --  1.8  --  1.9  --    Liver Function Tests: Recent Labs  Lab 11/12/19 0344 11/14/19 0057  AST 19 19  ALT 22 33  ALKPHOS 78 65  BILITOT 1.4* 1.6*  PROT 5.9* 5.9*  ALBUMIN 3.4* 2.5*   No results for input(s): LIPASE, AMYLASE in the last 168 hours. Recent Labs  Lab 11/14/19 0057  AMMONIA 17   CBC: Recent Labs  Lab 11/10/19 1244 11/12/19 0344 11/14/19 0057  WBC  --  20.6* 16.5*  HGB 15.0 15.4* 13.9  HCT 44.0 46.0 41.7  MCV  --  95.0 94.6  PLT  --  326 274   Cardiac Enzymes:   No results for input(s): CKTOTAL, CKMB, CKMBINDEX, TROPONINI in the last 168 hours. BNP (last 3 results) No results for input(s): BNP in the last 8760 hours.  ProBNP (last 3 results) No results for input(s): PROBNP in the last 8760 hours.  CBG: Recent Labs  Lab 11/12/19 1347 11/13/19 2237  GLUCAP 69* 127*    Recent Results (from the past 240 hour(s))  SARS CORONAVIRUS 2 (TAT 6-24 HRS) Nasopharyngeal Nasopharyngeal Swab     Status: None     Collection Time: 11/10/19  1:34 PM   Specimen: Nasopharyngeal Swab  Result Value Ref Range Status   SARS Coronavirus 2 NEGATIVE NEGATIVE Final    Comment: (NOTE) SARS-CoV-2 target nucleic acids are NOT DETECTED. The SARS-CoV-2 RNA is generally detectable in upper and lower respiratory specimens during the acute phase of infection. Negative results do not preclude SARS-CoV-2 infection, do not rule out co-infections with other pathogens, and should not be used as the sole basis for treatment or other patient management decisions. Negative  results must be combined with clinical observations, patient history, and epidemiological information. The expected result is Negative. Fact Sheet for Patients: SugarRoll.be Fact Sheet for Healthcare Providers: https://www.woods-mathews.com/ This test is not yet approved or cleared by the [place] FDA and  has been authorized for detection and/or diagnosis of SARS-CoV-2 by FDA under an Emergency Use Authorization (EUA). This EUA will remain  in effect (meaning this test can be used) for the duration of the COVID-19 declaration under Section 56 4(b)(1) of the Act, 21 U.S.C. section 360bbb-3(b)(1), unless the authorization is terminated or revoked sooner. Performed at [facility], [place] [address], [place], [place] [address]   Culture, blood (routine x 2)     Status: None (Preliminary result)   Collection Time: 11/14/19 12:57 AM   Specimen: BLOOD  Result Value Ref Range Status   Specimen Description BLOOD LEFT ANTECUBITAL  Final   Special Requests   Final    BOTTLES DRAWN AEROBIC AND ANAEROBIC Blood Culture results may not be optimal due to an excessive volume of blood received in culture bottles   Culture   Final    NO GROWTH 3 DAYS Performed at [facility], [place] [address], [place], [place] [address]    Report Status PENDING  Incomplete  Culture, blood (routine x 2)     Status: None  (Preliminary result)   Collection Time: 11/14/19  1:00 AM   Specimen: BLOOD  Result Value Ref Range Status   Specimen Description BLOOD LEFT ANTECUBITAL  Final   Special Requests   Final    BOTTLES DRAWN AEROBIC AND ANAEROBIC Blood Culture results may not be optimal due to an excessive volume of blood received in culture bottles   Culture   Final    NO GROWTH 3 DAYS Performed at [facility], [place] [address], [place], [place] [address]    Report Status PENDING  Incomplete     Studies: No results found.  Scheduled Meds: . aspirin EC  81 mg Oral Daily  . atorvastatin  40 mg Oral Daily  . bethanechol  10 mg Oral QID  . bisacodyl  10 mg Rectal Daily  . clopidogrel  75 mg Oral Daily  . diltiazem  120 mg Oral Daily  . enoxaparin (LOVENOX) injection  40 mg Subcutaneous Q24H  . polyethylene glycol  17 g Oral Daily  . potassium chloride  40 mEq Oral TID  . senna-docusate  1 tablet Oral BID    Continuous Infusions:   Time spent: 45mns I have personally reviewed and interpreted on  11/17/2019 daily labs, tele strips, imagings as discussed above under date review session and assessment and plans.  I reviewed all nursing notes, pharmacy notes, consultant notes,  vitals, pertinent old records  I have discussed plan of care as described above with RN , patient on 11/17/2019   FFlorencia ReasonsMD, PhD, FACP  [facility]  Available via Epic secure chat 7am-7pm for nonurgent issues Please page for urgent issues, pager number available through [facility] [email or web address] .   11/17/2019, 10:18 AM  LOS: 7 days

## 2019-11-17 NOTE — Progress Notes (Signed)
  Speech Language Pathology Treatment: Dysphagia;Cognitive-Linquistic  Patient Details Name: Lynn [name] MRN: [id number] DOB: 1946-04-18 Today's Date: 11/17/2019 Time: [id number] SLP Time Calculation (min) (ACUTE ONLY): 27 min  Assessment / Plan / Recommendation Clinical Impression  Pt consumed PO trials from current diet textures as well as advanced ones. She still shows signs of discoordination particularly with thin liquids, including anterior loss and audible swallows. Throat clearing was noted intermittently. Outwardly this appears to be improved with nectar thick liquids, particularly by spoon as was recommended on MBS. Pt needed extra time, Min cues, and additional pureed bolus to clear small bites of soft solids from her mouth. Although she is capable of doing so, it is effortful. Would continue with current diet for now.   From a communication standpoint she had limited expressive language observed today. When she does speak it is very soft, with Mod cues provided by SLP to increase volume to facilitate intelligibility. She did state her name and her birthday and would respond appropriately with one-word phrases, but otherwise she did not spontaneously engage in communication. With her glasses donned she was able to read a card to know who her flowers had been sent from. She will benefit from ongoing SLP follow up.     HPI HPI: Lynn [name] is a 74 y.o. female with medical history significant of HTN and depression presenting with "slow thinking."  She reports she had difficulty talking after waking up.  Her mother's bedroom is downstairs and she came to the head of the stairs and couldn't figure out how to do this and appeared to have difficulty speaking.  +expressive aphasia.   Her left arm is feeling weak.  She had a headache 2 weeks ago.  No h/o prior CVA.  Yesterday was a perfectly normal day.    She was previously healthy and independent until about 2-3 years ago, and has  slowed down in the last few years since moving in to her mother's home.  MRI + for B infarcts in basal ganglia and corona radiata. Pt       SLP Plan  Continue with current plan of care       Recommendations  Diet recommendations: Dysphagia 1 (puree);Nectar-thick liquid Liquids provided via: Teaspoon Medication Administration: Crushed with puree Supervision: Staff to assist with self feeding;Full supervision/cueing for compensatory strategies Compensations: Slow rate;Small sips/bites;Follow solids with liquid;Multiple dry swallows after each bite/sip;Effortful swallow Postural Changes and/or Swallow Maneuvers: Seated upright 90 degrees                Oral Care Recommendations: Oral care BID Follow up Recommendations: Inpatient Rehab SLP Visit Diagnosis: Dysphagia, oropharyngeal phase (R13.12);Dysarthria and anarthria (R47.1) Plan: Continue with current plan of care       GO                 Osie Bond., M.A. [facility] Pager [phone] Office [phone]  11/17/2019, 4:31 PM

## 2019-11-17 NOTE — Progress Notes (Signed)
Physical Therapy Treatment Patient Details Name: JOBETH FURTAW MRN: [id number] DOB: 1946-03-03 Today's Date: 11/17/2019    History of Present Illness Pt is a 74 y/o female admitted secondary to L arm weakness and speech difficulty. MRI revealed Patchy acute infarcts involving the left greater than right basal ganglia and corona radiata. PMH includes HTN and depression.    PT Comments    Pt lethargic on arrival, knee pain limiting pt's function with R LE.  Pt still inattentive to the L, but attention CAN be drawn to the L.  Emphasis on transitions to EOB, sitting balance, sit to stand and squat transfers.    Follow Up Recommendations  CIR;Supervision/Assistance - 24 hour     Equipment Recommendations  Other (comment)(TBA)    Recommendations for Other Services       Precautions / Restrictions Precautions Precautions: Fall    Mobility  Bed Mobility Overal bed mobility: Needs Assistance Bed Mobility: Rolling;Sidelying to Sit Rolling: Total assist;+2 for safety/equipment Sidelying to sit: Total assist;+2 for physical assistance       General bed mobility comments: pt with limited initiation.  Needing hand over hand assist with UE's to come up and forward.  Worked on asymmetrical scooting with w/shifting.  Transfers Overall transfer level: Needs assistance   Transfers: Sit to/from [employer] Transfers Sit to Stand: Total assist;+2 physical assistance;+2 safety/equipment   Squat pivot transfers: Total assist;+2 physical assistance     General transfer comment: pt can be assisted into a submaximal stand with knees and trunk not fully extended.  pt does not attempt to pivot during the transfer and needs full pivotal assist.  Ambulation/Gait             General Gait Details: unable this day   Stairs             Wheelchair Mobility    Modified Rankin (Stroke Patients Only) Modified Rankin (Stroke Patients Only) Pre-Morbid Rankin Score: No  symptoms Modified Rankin: Severe disability     Balance Overall balance assessment: Needs assistance Sitting-balance support: Feet supported;Bilateral upper extremity supported Sitting balance-Leahy Scale: Poor Sitting balance - Comments: st >10 min at EOB working on upright midline balance, erect posture, propped on elbows.  moderate assist if not fully centered in midline.     Standing balance-Leahy Scale: Zero                              Cognition Arousal/Alertness: Awake/alert Behavior During Therapy: Flat affect Overall Cognitive Status: Impaired/Different from baseline Area of Impairment: Attention;Following commands;Problem solving;Memory                   Current Attention Level: Sustained Memory: Decreased recall of precautions Following Commands: Follows one step commands with increased time     Problem Solving: Slow processing;Decreased initiation;Difficulty sequencing;Requires verbal cues;Requires tactile cues General Comments: L inattention      Exercises Other Exercises Other Exercises: bil LE ROM exercise with emphasis on gaining more active movement and decreasing stiffness and pain in the R LE.    General Comments General comments (skin integrity, edema, etc.): Mom and brother observed this session and participated in discussion of cir vs SNF, pros and cons of each.  Whether she is starting to show signs of a slower progression.      Pertinent Vitals/Pain Pain Assessment: Faces Faces Pain Scale: Hurts little more Pain Location: R knee calf and ?hip Pain Descriptors / Indicators: Grimacing;Guarding  Pain Intervention(s): Monitored during session;Limited activity within patient's tolerance    Home Living                      Prior Function            PT Goals (current goals can now be found in the care plan section) Acute Rehab PT Goals Patient Stated Goal: to be independent PT Goal Formulation: With patient Time For  Goal Achievement: 11/25/19 Potential to Achieve Goals: Good Progress towards PT goals: Progressing toward goals(slow improvement)    Frequency    Min 4X/week      PT Plan Current plan remains appropriate    Co-evaluation              AM-PAC PT "6 Clicks" Mobility   Outcome Measure  Help needed turning from your back to your side while in a flat bed without using bedrails?: Total Help needed moving from lying on your back to sitting on the side of a flat bed without using bedrails?: A Lot Help needed moving to and from a bed to a chair (including a wheelchair)?: A Lot Help needed standing up from a chair using your arms (e.g., wheelchair or bedside chair)?: A Lot Help needed to walk in hospital room?: A Lot Help needed climbing 3-5 steps with a railing? : A Lot 6 Click Score: 11    End of Session   Activity Tolerance: Patient tolerated treatment well Patient left: with call bell/phone within reach;with family/visitor present;with chair alarm set;in chair Nurse Communication: Mobility status;Precautions PT Visit Diagnosis: Unsteadiness on feet (R26.81);Hemiplegia and hemiparesis;Muscle weakness (generalized) (M62.81);Pain Hemiplegia - Right/Left: Left Hemiplegia - dominant/non-dominant: Non-dominant Hemiplegia - caused by: Cerebral infarction Pain - Right/Left: Right Pain - part of body: (knee leg hip)     Time: [id number] PT Time Calculation (min) (ACUTE ONLY): 48 min  Charges:  $Therapeutic Activity: 23-37 mins $Neuromuscular Re-education: 8-22 mins                     11/17/2019  Ginger Carne., PT Acute Rehabilitation Services [phone]  (pager) [phone]  (office)   Tessie Fass Asmar Brozek 11/17/2019, 11:53 AM

## 2019-11-17 NOTE — Progress Notes (Signed)
Pt's brother Clair Gulling called, stating that he spoke to an Psychologist, occupational at patient experience" and stated that the patient has been cleared to have 3 visitors:  1. Clair Gulling (brother) 2. Benjamine Mola (mother) 3. [name] Harrington (sister)  Clair Gulling can be reached at: [phone]  PM RN is not aware of this information, nor is Probation officer aware -  this information on the pt's report sheet. This information is being passed to the DON of this unit, JaKeema and documented here.

## 2019-11-17 NOTE — TOC Initial Note (Signed)
Transition of Care [facility]) - Initial/Assessment Note    Patient Details  Name: Lynn [name] MRN: [id number] Date of Birth: 03-24-1946  Transition of Care [facility]) CM/SW Contact:    Angelita Ingles, RN Phone Number: [phone] 11/17/2019, 10:53 AM  Clinical Narrative:  CM at bedside to discuss discharge disposition with patient. Mother of the patient at the bedside. Patient mumbles unrecognized words and nods to questions when asked. Mother explains to patient that CM here to discuss care after discharge. Patient does not verbalize understanding, she just nods. Mother provides history that patient lives with her in a two story home. Mother reports that patient does not currently have any DME, only a cane because patient was independent prior to admission. Mother states that she understands patient will need rehab but hopes that she is able to return home at some point. Patient is unable to give any input but does nod when her mother ask her if she wanted to list Dr. Yong Channel as her doctor. Mother encourages patient to be active in the conversation however CM does not here patient say any recognizable words. List of SNF choices is provided for the patient and mother. Mother states she will look at the list and provide feedback on tomorrow. CM will continue to follow.                Expected Discharge Plan: IP Rehab Facility(CIR versus SNF) Barriers to Discharge: Continued Medical Work up   Patient Goals and CMS Choice   CMS Medicare.gov Compare Post Acute Care list provided to:: Patient Represenative (must comment)(given to mother at bedside) Choice offered to / list presented to : Parent  Expected Discharge Plan and Services Expected Discharge Plan: IP Rehab Facility(CIR versus SNF) In-house Referral: NA Discharge Planning Services: CM Consult Post Acute Care Choice: NA Living arrangements for the past 2 months: Single Family Home(with mother)                 DME Arranged: N/A DME  Agency: NA       HH Arranged: NA Helper Agency: NA        Prior Living Arrangements/Services Living arrangements for the past 2 months: Single Family Home(with mother) Lives with:: Parents Patient language and need for interpreter reviewed:: No(not needed) Do you feel safe going back to the place where you live?: (Patient currently unable to verbalize answer to the question)      Need for Family Participation in Patient Care: Yes (Comment) Care giver support system in place?: Yes (comment)   Criminal Activity/Legal Involvement Pertinent to Current Situation/Hospitalization: No - Comment as needed  Activities of Daily Living   ADL Screening (condition at time of admission) Is the patient deaf or have difficulty hearing?: No Does the patient have difficulty seeing, even when wearing glasses/contacts?: No Does the patient have difficulty concentrating, remembering, or making decisions?: No Patient able to express need for assistance with ADLs?: Yes Does the patient have difficulty dressing or bathing?: No Independently performs ADLs?: Yes (appropriate for developmental age) Does the patient have difficulty walking or climbing stairs?: Yes Weakness of Legs: Both Weakness of Arms/Hands: Left  Permission Sought/Granted   Permission granted to share information with : Yes, Verbal Permission Granted(mother at bedside and asked patient if it was ok for CM to talk to her patient nods yes)           Permission granted to share info w Contact Information: [place]  Emotional Assessment Appearance:: Appears stated age Attitude/Demeanor/Rapport:  Unable to Assess(patient nonverbal only nods at this time) Affect (typically observed): Unable to Assess(patient nonverbal only nods in response to questions) Orientation: : (unable to assess patient is not responding verbally to questions only nods) Alcohol / Substance Use: Not Applicable Psych Involvement: No (comment)  Admission  diagnosis:  Weakness [R53.1] CVA (cerebral vascular accident) [facility]) [I63.9] Patient Active Problem List   Diagnosis Date Noted  . Depression   . Hypokalemia   . Leukocytosis   . CVA (cerebral vascular accident) ([place]) 11/10/2019  . Senile purpura ([place]) 04/27/2019  . Prolapsed uterus 04/27/2019  . Aortic atherosclerosis ([place]) 06/26/2017  . Neuropathy 05/20/2017  . Abnormality of gait due to impairment of balance 05/20/2017  . Incontinence in female 06/25/2014  . Hearing deficit 06/25/2014  . Osteoporosis 06/24/2013  . Hypertension 05/11/2013  . Osteoarthritis 05/11/2013  . Vitamin D deficiency 05/11/2013  . Personal history of colonic polyps 05/11/2013  . Hyperlipidemia 05/11/2013   PCP:  Marin Olp, MD Pharmacy:   [facility] [place], [place] [id number] Phone: [phone] Fax: [phone]     Social Determinants of Health (SDOH) Interventions    Readmission Risk Interventions No flowsheet data found.

## 2019-11-18 ENCOUNTER — Inpatient Hospital Stay (HOSPITAL_COMMUNITY): Payer: Medicare Other

## 2019-11-18 LAB — COMPREHENSIVE METABOLIC PANEL
ALT: 90 U/L — ABNORMAL HIGH (ref 0–44)
AST: 55 U/L — ABNORMAL HIGH (ref 15–41)
Albumin: 2.3 g/dL — ABNORMAL LOW (ref 3.5–5.0)
Alkaline Phosphatase: 86 U/L (ref 38–126)
Anion gap: 19 — ABNORMAL HIGH (ref 5–15)
BUN: 21 mg/dL (ref 8–23)
CO2: 12 mmol/L — ABNORMAL LOW (ref 22–32)
Calcium: 9.1 mg/dL (ref 8.9–10.3)
Chloride: 108 mmol/L (ref 98–111)
Creatinine, Ser: 0.62 mg/dL (ref 0.44–1.00)
GFR calc Af Amer: >60 mL/min (ref >60)
GFR calc non Af Amer: >60 mL/min (ref >60)
Glucose, Bld: 123 mg/dL — ABNORMAL HIGH (ref 70–99)
Potassium: 4.4 mmol/L (ref 3.5–5.1)
Sodium: 139 mmol/L (ref 135–145)
Total Bilirubin: 0.8 mg/dL (ref 0.3–1.2)
Total Protein: 6 g/dL — ABNORMAL LOW (ref 6.5–8.1)

## 2019-11-18 LAB — CBC WITH DIFFERENTIAL/PLATELET
Abs Immature Granulocytes: 0.2 10*3/uL — ABNORMAL HIGH (ref 0.00–0.07)
Basophils Absolute: 0.1 10*3/uL (ref 0.0–0.1)
Basophils Relative: 1 %
Eosinophils Absolute: 0.1 10*3/uL (ref 0.0–0.5)
Eosinophils Relative: 1 %
HCT: 44.4 % (ref 36.0–46.0)
Hemoglobin: 14.8 g/dL (ref 12.0–15.0)
Immature Granulocytes: 1 %
Lymphocytes Relative: 5 %
Lymphs Abs: 0.9 10*3/uL (ref 0.7–4.0)
MCH: 31.4 pg (ref 26.0–34.0)
MCHC: 33.3 g/dL (ref 30.0–36.0)
MCV: 94.1 fL (ref 80.0–100.0)
Monocytes Absolute: 1.7 10*3/uL — ABNORMAL HIGH (ref 0.1–1.0)
Monocytes Relative: 9 %
Neutro Abs: 14.8 10*3/uL — ABNORMAL HIGH (ref 1.7–7.7)
Neutrophils Relative %: 83 %
Platelets: 399 10*3/uL (ref 150–400)
RBC: 4.72 MIL/uL (ref 3.87–5.11)
RDW: 13.4 % (ref 11.5–15.5)
WBC: 17.7 10*3/uL — ABNORMAL HIGH (ref 4.0–10.5)
nRBC: 0 % (ref 0.0–0.2)

## 2019-11-18 LAB — MAGNESIUM: Magnesium: 2 mg/dL (ref 1.7–2.4)

## 2019-11-18 MED ORDER — METOPROLOL TARTRATE 12.5 MG HALF TABLET
12.5000 mg | ORAL_TABLET | Freq: Two times a day (BID) | ORAL | Status: DC
  Administered 2019-11-18 – 2019-11-19 (×4): 12.5 mg via ORAL
  Filled 2019-11-18 (×4): qty 1

## 2019-11-18 MED ORDER — MAGNESIUM HYDROXIDE 400 MG/5ML PO SUSP: 15.0000 mL | Freq: Once | ORAL | Status: DC

## 2019-11-18 MED ORDER — POLYETHYLENE GLYCOL 3350 17 G PO PACK
17.0000 g | PACK | Freq: Two times a day (BID) | ORAL | Status: DC
  Administered 2019-11-18 – 2019-11-24 (×11): 17 g via ORAL
  Filled 2019-11-18 (×12): qty 1

## 2019-11-18 NOTE — NC FL2 (Signed)
[place] MEDICAID FL2 LEVEL OF CARE SCREENING TOOL     IDENTIFICATION  Patient Name: Lynn [name] Birthdate: [birth year] Sex: female Admission Date (Current Location): 11/10/2019  [facility] [place] and [place] Number:  Herbalist and Address:  [facility] [place] [address] [place], [place] [address]      Provider Number: [id number]  Attending Physician Name and Address:  Florencia Reasons, MD  Relative Name and Phone Number:  Clary Dobson [phone]    Current Level of Care: Hospital Recommended Level of Care: [place] Prior Approval Number:    Date Approved/Denied:   PASRR Number:    Discharge Plan: SNF    Current Diagnoses: Patient Active Problem List   Diagnosis Date Noted  . Depression   . Hypokalemia   . Leukocytosis   . CVA (cerebral vascular accident) ([place]) 11/10/2019  . Senile purpura ([place]) 04/27/2019  . Prolapsed uterus 04/27/2019  . Aortic atherosclerosis ([place]) 06/26/2017  . Neuropathy 05/20/2017  . Abnormality of gait due to impairment of balance 05/20/2017  . Incontinence in female 06/25/2014  . Hearing deficit 06/25/2014  . Osteoporosis 06/24/2013  . Hypertension 05/11/2013  . Osteoarthritis 05/11/2013  . Vitamin D deficiency 05/11/2013  . Personal history of colonic polyps 05/11/2013  . Hyperlipidemia 05/11/2013    Orientation RESPIRATION BLADDER Height & Weight     Self, Place  Normal(room air) Incontinent, External catheter Weight: 58 kg Height:  5\' 3"  (160 cm)  BEHAVIORAL SYMPTOMS/MOOD NEUROLOGICAL BOWEL NUTRITION STATUS  Other (Comment)(none) (n/a) Continent    AMBULATORY STATUS COMMUNICATION OF NEEDS Skin   Total Care Verbally(slurred speech) Skin abrasions(right foot second toe)                       Personal Care Assistance Level of Assistance  Bathing, Feeding, Dressing Bathing Assistance: Maximum assistance Feeding assistance: Limited assistance Dressing Assistance: Maximum  assistance Total Care Assistance: Maximum assistance   Functional Limitations Info  Sight, Hearing, Speech Sight Info: Adequate Hearing Info: Adequate Speech Info: Impaired(slurred speech)    SPECIAL CARE FACTORS FREQUENCY  PT (By licensed PT), OT (By licensed OT)     PT Frequency: 5X OT Frequency: 5X            Contractures Contractures Info: Not present    Additional Factors Info  Code Status, Allergies, Psychotropic, Insulin Sliding Scale, Isolation Precautions, Suctioning Needs Code Status Info: Full Allergies Info: Chlorhexidine Psychotropic Info: Hx of anxiety/ depression no medications Insulin Sliding Scale Info: none Isolation Precautions Info: none Suctioning Needs: n/a   Current Medications (11/18/2019):  This is the current hospital active medication list Current Facility-Administered Medications  Medication Dose Route Frequency Provider Last Rate Last Admin  . acetaminophen (TYLENOL) tablet 650 mg  650 mg Oral Q6H PRN Cherene Altes, MD      . aspirin EC tablet 81 mg  81 mg Oral Daily Rosalin Hawking, MD   81 mg at 11/17/19 0850  . atorvastatin (LIPITOR) tablet 40 mg  40 mg Oral Daily Karmen Bongo, MD   40 mg at 11/17/19 0850  . bethanechol (URECHOLINE) tablet 10 mg  10 mg Oral QID Cherene Altes, MD   10 mg at 11/17/19 2128  . bisacodyl (DULCOLAX) EC tablet 5 mg  5 mg Oral Daily PRN Karmen Bongo, MD      . bisacodyl (DULCOLAX) suppository 10 mg  10 mg Rectal Daily Florencia Reasons, MD   10 mg at 11/17/19 1245  .  clopidogrel (PLAVIX) tablet 75 mg  75 mg Oral Daily Rosalin Hawking, MD   75 mg at 11/17/19 0850  . enoxaparin (LOVENOX) injection 40 mg  40 mg Subcutaneous Q24H Karmen Bongo, MD   40 mg at 11/17/19 1544  . labetalol (NORMODYNE) injection 10 mg  10 mg Intravenous Q2H PRN Opyd, Ilene Qua, MD      . metoprolol tartrate (LOPRESSOR) tablet 12.5 mg  12.5 mg Oral BID Florencia Reasons, MD      . ondansetron [facility]) tablet 4 mg  4 mg Oral Q6H PRN Karmen Bongo, MD        Or  . ondansetron [facility]) injection 4 mg  4 mg Intravenous Q6H PRN Karmen Bongo, MD      . polyethylene glycol (MIRALAX / GLYCOLAX) packet 17 g  17 g Oral BID Florencia Reasons, MD      . Resource ThickenUp Clear   Oral PRN Cherene Altes, MD      . senna-docusate (Senokot-S) tablet 1 tablet  1 tablet Oral BID Florencia Reasons, MD   1 tablet at 11/17/19 2128  . zolpidem (AMBIEN) tablet 5 mg  5 mg Oral QHS PRN Karmen Bongo, MD         Discharge Medications: Please see discharge summary for a list of discharge medications.  Relevant Imaging Results:  Relevant Lab Results:   Additional Information SSN [id number]  Angelita Ingles, RN

## 2019-11-18 NOTE — Progress Notes (Signed)
Physical Therapy Treatment Patient Details Name: Lynn [name] MRN: [id number] DOB: May 20, 1946 Today's Date: 11/18/2019    History of Present Illness Pt is a 74 y/o female admitted secondary to L arm weakness and speech difficulty. MRI revealed Patchy acute infarcts involving the left greater than right basal ganglia and corona radiata. PMH includes HTN and depression.    PT Comments    Pt initially lethargic and appearing apathetic, but once up at EOB she was aroused and participated in balance, kicking out in LAQ's and hip flexors for 5-7 reps, right stronger than L LE as expected.  Worked on moving UE's from positions of stability on the bed to her lap and back.  Moved to sit to stand and transfers to chair.   Follow Up Recommendations  CIR;Supervision/Assistance - 24 hour     Equipment Recommendations  Other (comment)(TBA)    Recommendations for Other Services       Precautions / Restrictions Precautions Precautions: Fall    Mobility  Bed Mobility Overal bed mobility: Needs Assistance Bed Mobility: Rolling;Sidelying to Sit Rolling: Total assist;+2 for safety/equipment Sidelying to sit: Total assist;+2 for physical assistance       General bed mobility comments: pt with limited initiation.  Needing hand over hand assist with UE's to come up and forward.  Worked on asymmetrical scooting with w/shifting.  Transfers Overall transfer level: Needs assistance   Transfers: Sit to/from Stand;Squat Pivot Transfers Sit to Stand: +2 physical assistance;+2 safety/equipment;Max assist   Squat pivot transfers: Total assist;+2 physical assistance;Max assist;From elevated surface     General transfer comment: After sitting activity, pt was able to recruit more from the L side and was moving R LE better with less pain.  Pt gave more effort today.  Ambulation/Gait             General Gait Details: unable this day   Stairs             Wheelchair Mobility     Modified Rankin (Stroke Patients Only) Modified Rankin (Stroke Patients Only) Pre-Morbid Rankin Score: No symptoms Modified Rankin: Severe disability     Balance Overall balance assessment: Needs assistance Sitting-balance support: Feet supported;Bilateral upper extremity supported;Single extremity supported Sitting balance-Leahy Scale: Poor Sitting balance - Comments: st >10 min at EOB working on upright midline balance, trying to come back to midline.  Balance is still weak, but pt able to activate more abdominals.  Pt spent 10 or more seconds actively balancing around midline before listing posterior.y     Standing balance-Leahy Scale: Zero                              Cognition Arousal/Alertness: Awake/alert;Lethargic Behavior During Therapy: Flat affect Overall Cognitive Status: Impaired/Different from baseline Area of Impairment: Attention;Following commands;Problem solving;Memory                   Current Attention Level: Sustained Memory: Decreased recall of precautions Following Commands: Follows one step commands with increased time     Problem Solving: Slow processing;Decreased initiation;Difficulty sequencing;Requires verbal cues;Requires tactile cues General Comments: L inattention      Exercises Other Exercises Other Exercises: bil LE ROM exercise with emphasis on gaining more active movement and decreasing stiffness and pain in the R LE.    General Comments General comments (skin integrity, edema, etc.): vss      Pertinent Vitals/Pain Pain Assessment: Faces Faces Pain Scale: Hurts little more Pain  Location: R knee calf and ?hip Pain Descriptors / Indicators: Grimacing;Guarding Pain Intervention(s): Limited activity within patient's tolerance;Monitored during session    Home Living                      Prior Function            PT Goals (current goals can now be found in the care plan section) Acute Rehab PT  Goals Patient Stated Goal: to be independent PT Goal Formulation: With patient Time For Goal Achievement: 11/25/19 Potential to Achieve Goals: Good Progress towards PT goals: Progressing toward goals    Frequency    Min 4X/week      PT Plan Current plan remains appropriate    Co-evaluation              AM-PAC PT "6 Clicks" Mobility   Outcome Measure  Help needed turning from your back to your side while in a flat bed without using bedrails?: Total Help needed moving from lying on your back to sitting on the side of a flat bed without using bedrails?: Total Help needed moving to and from a bed to a chair (including a wheelchair)?: Total Help needed standing up from a chair using your arms (e.g., wheelchair or bedside chair)?: Total Help needed to walk in hospital room?: Total Help needed climbing 3-5 steps with a railing? : Total 6 Click Score: 6    End of Session   Activity Tolerance: Patient tolerated treatment well;Patient limited by lethargy(but improved lethargy/apathy) Patient left: with call bell/phone within reach;with family/visitor present;with chair alarm set;in chair Nurse Communication: Mobility status;Precautions PT Visit Diagnosis: Unsteadiness on feet (R26.81);Hemiplegia and hemiparesis;Muscle weakness (generalized) (M62.81);Pain Hemiplegia - Right/Left: Left Hemiplegia - dominant/non-dominant: Non-dominant Hemiplegia - caused by: Cerebral infarction Pain - Right/Left: Right Pain - part of body: (knee leg hip)     Time: [id number] PT Time Calculation (min) (ACUTE ONLY): 31 min  Charges:  $Therapeutic Activity: 8-22 mins $Neuromuscular Re-education: 8-22 mins                     11/18/2019  Ginger Carne., PT Acute Rehabilitation Services [phone]  (pager) [phone]  (office)   [name] 11/18/2019, 3:37 PM

## 2019-11-18 NOTE — Progress Notes (Addendum)
PROGRESS NOTE  Lynn [name] [id number] DOB: May 14, 1946 DOA: 11/10/2019 PCP: Marin Olp, MD  Brief Narrative:  [phone] with a history of HTN, HLD, and depression/anxiety who presented with dysarthria/expressive aphasia and confusion.  She also felt that her left arm was weak.  She has no prior history of CVA.  In the ED she was confirmedto have left arm weakness and slurred speech.  MRI confirmed bilateral CVAs.  Neurology was consulted and [facility] admitted the patient.   Significant Events: 4/20 admit via [facility] for CVA 4/21 TTE EF 60-65% 4/22 diet advanced to dysphagia 1 with nectar thick liquids  Antimicrobials:  None  HPI/Recap of past 24 hours:  She is very weak, denies pain, no hypoxia She reports being constipated  Has dysarthria  + hypophonia  Assessment/Plan: Principal Problem:   CVA (cerebral vascular accident) ([place]) Active Problems:   Hypertension   Hyperlipidemia   Depression   Hypokalemia   Leukocytosis   Acute ischemic bilateral CVAs - L>R BG and CR New left-sided weakness, facial droop, and expressive aphasia -initial CT suggested a possible left basal ganglia infarct and CTa was negative for large vessel occlusion or severe stenosis -subsequent MRI noted bilateral infarcts of the basal ganglia and corona radiata and mild small vessel disease - neurology directed her stroke evaluation - was taking aspirin 81 mg daily previously -acute strokes are felt to have been related to small vessel disease - A1c not elevated at 5.1 -LDL elevated and treatment begun - plan is to add Plavix to aspirin for 3 months then transition to Plavix only thereafter -given bilateral involvement neurology has recommended a 30-day cardiac event monitor as an outpatient to rule out atrial fibrillation -PT/OT are recommending CIR and the patient is interested CIR recommend SNF level care for prolonged recovery, social worker consulted   Altered mental status  4/23 Suspect this was related to 1 dose of Norco that she was given -repeat acute CVA work-up was unrevealing -mental status dramatically improved  She is AAOx3 and oriented to situation this morning  Possible cognitive deficit Will need outpatient follow-up for possible cognitive decline/memory disorder -family reports this was present prior to this event  Dysphagia Consequence of CVA -cleared for D2 diet with nectar thick liquids by SLP initially and now upgraded to dysphagia 1 with nectar thick liquids - hopeful for further advancement of diet with ongoing speech therapy -speech improving  Acute urinary retention -Likely combination of neurogenic bladder, immobility, constipation -Treat reversible causes -Foley catheter placed 4/25 -Urecholine initiated , increase mobility, treat constipation, voiding trial in 24 hours to 48 hours  Hyponatremia and hypokalemia Replaced, normalized Repeat lab in the morning  Leukocytosis; no fever.  Does not appear septic Blood culture no growth cxr: 1. Low lung volumes with bibasilar subsegmental atelectasis. 2. Aortic atherosclerosis repeat CBC in the morning, monitor  Abdominal distension, though soft, does not appear tender, Constipation Elevated lft Will get kub/ab [place]  Right foot pain Will get foot/ankle x ray  Addendum ankle x-ray showed "Soft tissue swelling. Suspect small avulsion arising from the medial talus. Evidence of old injury with remodeling lateral malleolus. Ankle mortise appears intact. Small inferior calcaneal spur noted."  Case discussed with Ortho on-call Dr. Maxie Better who recommended apply postop shoe and outpatient follow-up with Dr. Doran Durand  DVT Prophylaxis: Lovenox subcu 40 mg  Code Status: Full  Family Communication: patient   Disposition Plan:    Patient came from:  Home                                                                                   Anticipated d/c place:  SNF  Barriers  to d/c OR conditions which need to be met to effect a safe d/c:  Needs SNF placement   Consultants:  CIR  Neurology  Procedures:  Foley insertion April 25  Antibiotics:  None   Objective: BP (!) 148/75 (BP Location: Left Arm)   Pulse 74   Temp 98.6 F (37 C)   Resp 19   Ht 5' 3"  (1.6 m)   Wt 58 kg   LMP 07/23/1998 (Approximate)   SpO2 94%   BMI 22.65 kg/m   Intake/Output Summary (Last 24 hours) at 11/18/2019 1856 Last data filed at 11/17/2019 2000 Gross per 24 hour  Intake 120 ml  Output 800 ml  Net -680 ml   Filed Weights   11/10/19 1159  Weight: 58 kg    Exam: Patient is examined daily including today on 11/18/2019, exams remain the same as of yesterday except that has changed    General: Very weak but NAD,   Cardiovascular: RRR  Respiratory: Diminished at bases, poor respiratory effort, no wheezing, no rales, no rhonchi  Abdomen: positive BS, appear distended, though soft, nontender,   Musculoskeletal: No Edema  Neuro: alert, oriented x3, able to move her right upper extremity better than left upper extremity, bilateral lower extremity not able to lift against gravity, bilateral foot drop right more than left, right dorsal foot tenderness  Data Reviewed: Basic Metabolic Panel: Recent Labs  Lab 11/13/19 0332 11/14/19 0057 11/16/19 0523 11/17/19 0204 11/18/19 0343  NA 135 134* 137 135 139  K 3.2* 3.8 2.9* 3.8 4.4  CL 107 102 106 106 108  CO2 20* 21* 23 20* 12*  GLUCOSE 120* 119* 102* 106* 123*  BUN 15 18 18 18 21   CREATININE 0.66 0.71 0.61 0.64 0.62  CALCIUM 8.7* 9.1 8.4* 8.8* 9.1  MG 1.8  --  1.9  --  2.0   Liver Function Tests: Recent Labs  Lab 11/12/19 0344 11/14/19 0057 11/18/19 0343  AST 19 19 55*  ALT 22 33 90*  ALKPHOS 78 65 86  BILITOT 1.4* 1.6* 0.8  PROT 5.9* 5.9* 6.0*  ALBUMIN 3.4* 2.5* 2.3*   No results for input(s): LIPASE, AMYLASE in the last 168 hours. Recent Labs  Lab 11/14/19 0057  AMMONIA 17   CBC: Recent  Labs  Lab 11/12/19 0344 11/14/19 0057 11/18/19 0343  WBC 20.6* 16.5* 17.7*  NEUTROABS  --   --  14.8*  HGB 15.4* 13.9 14.8  HCT 46.0 41.7 44.4  MCV 95.0 94.6 94.1  PLT 326 274 399   Cardiac Enzymes:   No results for input(s): CKTOTAL, CKMB, CKMBINDEX, TROPONINI in the last 168 hours. BNP (last 3 results) No results for input(s): BNP in the last 8760 hours.  ProBNP (last 3 results) No results for input(s): PROBNP in the last 8760 hours.  CBG: Recent Labs  Lab 11/12/19 1347 11/13/19 2237  GLUCAP 69* 127*    Recent Results (from the past 240 hour(s))  SARS CORONAVIRUS 2 (  TAT 6-24 HRS) Nasopharyngeal Nasopharyngeal Swab     Status: None   Collection Time: 11/10/19  1:34 PM   Specimen: Nasopharyngeal Swab  Result Value Ref Range Status   SARS Coronavirus 2 NEGATIVE NEGATIVE Final    Comment: (NOTE) SARS-CoV-2 target nucleic acids are NOT DETECTED. The SARS-CoV-2 RNA is generally detectable in upper and lower respiratory specimens during the acute phase of infection. Negative results do not preclude SARS-CoV-2 infection, do not rule out co-infections with other pathogens, and should not be used as the sole basis for treatment or other patient management decisions. Negative results must be combined with clinical observations, patient history, and epidemiological information. The expected result is Negative. Fact Sheet for Patients: SugarRoll.be Fact Sheet for Healthcare Providers: https://www.woods-mathews.com/ This test is not yet approved or cleared by the [place] FDA and  has been authorized for detection and/or diagnosis of SARS-CoV-2 by FDA under an Emergency Use Authorization (EUA). This EUA will remain  in effect (meaning this test can be used) for the duration of the COVID-19 declaration under Section 56 4(b)(1) of the Act, 21 U.S.C. section 360bbb-3(b)(1), unless the authorization is terminated or revoked  sooner. Performed at [facility], [place] [address] [place], [place] [address]   Culture, blood (routine x 2)     Status: None (Preliminary result)   Collection Time: 11/14/19 12:57 AM   Specimen: BLOOD  Result Value Ref Range Status   Specimen Description BLOOD LEFT ANTECUBITAL  Final   Special Requests   Final    BOTTLES DRAWN AEROBIC AND ANAEROBIC Blood Culture results may not be optimal due to an excessive volume of blood received in culture bottles   Culture   Final    NO GROWTH 3 DAYS Performed at [facility], [place] [address], [place], [place] [address]    Report Status PENDING  Incomplete  Culture, blood (routine x 2)     Status: None (Preliminary result)   Collection Time: 11/14/19  1:00 AM   Specimen: BLOOD  Result Value Ref Range Status   Specimen Description BLOOD LEFT ANTECUBITAL  Final   Special Requests   Final    BOTTLES DRAWN AEROBIC AND ANAEROBIC Blood Culture results may not be optimal due to an excessive volume of blood received in culture bottles   Culture   Final    NO GROWTH 3 DAYS Performed at [facility], [place] [address] [place],  [address]    Report Status PENDING  Incomplete     Studies: No results found.  Scheduled Meds: . aspirin EC  81 mg Oral Daily  . atorvastatin  40 mg Oral Daily  . bethanechol  10 mg Oral QID  . bisacodyl  10 mg Rectal Daily  . clopidogrel  75 mg Oral Daily  . enoxaparin (LOVENOX) injection  40 mg Subcutaneous Q24H  . magnesium hydroxide  15 mL Oral Once  . metoprolol tartrate  12.5 mg Oral BID  . polyethylene glycol  17 g Oral BID  . senna-docusate  1 tablet Oral BID    Continuous Infusions:   Time spent: 40mns I have personally reviewed and interpreted on  11/18/2019 daily labs, tele strips, imagings as discussed above under date review session and assessment and plans.  I reviewed all nursing notes, pharmacy notes, consultant notes,  vitals, pertinent old records  I have discussed plan  of care as described above with RN , patient on 11/18/2019   FFlorencia ReasonsMD, PhD, FRosalita Chessman  [facility]  Available via Epic secure chat 7am-7pm for nonurgent issues Please page for urgent issues, pager number available through [email or web address] .   11/18/2019, 9:18 AM  LOS: 8 days

## 2019-11-18 NOTE — Progress Notes (Signed)
Orthopedic Tech Progress Note Patient Details:  Lynn [name] 1945-09-12 [id number]  Ortho Devices Type of Ortho Device: Postop shoe/boot Ortho Device/Splint Location: RLE Ortho Device/Splint Interventions: Application   Post Interventions Patient Tolerated: Well Instructions Provided: Adjustment of device   Lynn [name] [name] 11/18/2019, 1:43 PM

## 2019-11-18 NOTE — TOC Progression Note (Addendum)
Transition of Care [facility]) - Progression Note    Patient Details  Name: Lynn [name] MRN: [id number] Date of Birth: 1945/10/02  [facility] CM/SW Contact  Angelita Ingles, RN Phone Number: 11/18/2019, 11:17 AM  Clinical Narrative:   11/18/2019 CM received message from MD stating that patient was declined by CIR and CM needed to initiate SNF bed search. CM at bedside with mother and sister to expalin that CIR has declined the patient. Sister states that she wants to speak with a MD and neurologist because she needs to know  the patients prognosis. Mother and sister have facility list that was provided 2 days ago. Both states that they are not at a point where they can make a decision or allow information to be faxed out and would like for CM to send MD message before moving forward. CM has sent message to Dr. Erlinda Hong.   11/19/2019 1420 CM at bedside to discuss plan for discharge. Sister gives permission for CM to fax info out for bed offers. Sister states that the family will not make a decision until the family can have a meeting with the sister, brother, mother and MD. Message has been sent to Dr. Erlinda Hong. Info has been faxed out via hub.  11/19/2019 1540 Dr. Erlinda Hong on unit to have family meeting with family (sister, brother mother) to address family questions and concerns. Family ok to proceed with SNF placement.  11/20/2019 1100 Family at bedside (brother & mother). CM provided family with a list of facilities that have made a bed offer. CM informed family that we need to make a decision today due to patient being medically ready for discharge. Family states that they will research the choices and get back with the CM.   11/20/2019 1230 CM at bedside to ask Brother for update on facility choice. Brother states that the family has not made a decision, they are still researching choices.  11/20/2019 1536 Patients brother called CM to ask about the difference in rating for medicare.gov list versus  online ratings. CM explained that online ratings may be from previous patients and families and that medicare .gov ratings are based on medicare criteria. CM unable to give specific details about ratings or why the ratings are what they are. CM asked brother if the family has made a decision and the brother states no and that he is researching facilities. Call was ended and brother called right back to ask if this CM would be available this weekend. CM explained that another CM would be covering this weekend and that nursing staff would be able to locate then for a decision.   11/23/2019 Brother called to inform CM that he will have a final decision on placement within the hour.  11/23/2019 CM received another call from Clair Gull[facility]) requesting to know if CM could recall if the MD told the family that  An xray was done on patients foot. CM informed brother that the MD did inform the family that xray was done. Brother inquiring if the xray showed injury. CM informed the brother that this would need to be discussed with the MD.  Brother would like to know if the MD would be available today. Cm has informed brother that MD may not be on unit but nursing could page for questions.  11/23/2019 Brother called to report that family has made a decision and wants CM to proceed with placement at [facility].  11/23/2019 CM called to check the status of  insurance authorization. Authorization is currently being reviewed. Reference number [id number]. COVID swab results still pending also.  11/23/2019 1607 CM received call from [facility]. Updated PT notes are needed for insurance authorization. Message has been sent to PT.   11/24/2019 0815 updated noted have been faxed to [facility]. Ins auth still pending.  11/24/2019 1035 Insurance authorization has been approved. MD, family and facility made aware. Family to got to facility to sign paperwork this am. Awaiting bed assignment.    Expected Discharge Plan: IP Rehab Facility(CIR  versus SNF) Barriers to Discharge: Continued Medical Work up  Expected Discharge Plan and Services Expected Discharge Plan: IP Rehab Facility(CIR versus SNF) In-house Referral: NA Discharge Planning Services: CM Consult Post Acute Care Choice: NA Living arrangements for the past 2 months: Single Family Home(with mother)                 DME Arranged: N/A DME Agency: NA       HH Arranged: NA HH Agency: NA         Social Determinants of Health (SDOH) Interventions    Readmission Risk Interventions No flowsheet data found.

## 2019-11-19 LAB — CULTURE, BLOOD (ROUTINE X 2)
Culture: NO GROWTH
Culture: NO GROWTH

## 2019-11-19 LAB — COMPREHENSIVE METABOLIC PANEL
ALT: 82 U/L — ABNORMAL HIGH (ref 0–44)
AST: 42 U/L — ABNORMAL HIGH (ref 15–41)
Albumin: 2.2 g/dL — ABNORMAL LOW (ref 3.5–5.0)
Alkaline Phosphatase: 88 U/L (ref 38–126)
Anion gap: 10 (ref 5–15)
BUN: 34 mg/dL — ABNORMAL HIGH (ref 8–23)
CO2: 25 mmol/L (ref 22–32)
Calcium: 9.5 mg/dL (ref 8.9–10.3)
Chloride: 108 mmol/L (ref 98–111)
Creatinine, Ser: 0.7 mg/dL (ref 0.44–1.00)
GFR calc Af Amer: >60 mL/min (ref >60)
GFR calc non Af Amer: >60 mL/min (ref >60)
Glucose, Bld: 116 mg/dL — ABNORMAL HIGH (ref 70–99)
Potassium: 4.2 mmol/L (ref 3.5–5.1)
Sodium: 143 mmol/L (ref 135–145)
Total Bilirubin: 0.8 mg/dL (ref 0.3–1.2)
Total Protein: 5.9 g/dL — ABNORMAL LOW (ref 6.5–8.1)

## 2019-11-19 LAB — CBC WITH DIFFERENTIAL/PLATELET
Abs Immature Granulocytes: 0.22 10*3/uL — ABNORMAL HIGH (ref 0.00–0.07)
Basophils Absolute: 0.1 10*3/uL (ref 0.0–0.1)
Basophils Relative: 1 %
Eosinophils Absolute: 0.2 10*3/uL (ref 0.0–0.5)
Eosinophils Relative: 1 %
HCT: 44.9 % (ref 36.0–46.0)
Hemoglobin: 14.6 g/dL (ref 12.0–15.0)
Immature Granulocytes: 1 %
Lymphocytes Relative: 6 %
Lymphs Abs: 1 10*3/uL (ref 0.7–4.0)
MCH: 31.4 pg (ref 26.0–34.0)
MCHC: 32.5 g/dL (ref 30.0–36.0)
MCV: 96.6 fL (ref 80.0–100.0)
Monocytes Absolute: 1.4 10*3/uL — ABNORMAL HIGH (ref 0.1–1.0)
Monocytes Relative: 9 %
Neutro Abs: 12.4 10*3/uL — ABNORMAL HIGH (ref 1.7–7.7)
Neutrophils Relative %: 82 %
Platelets: 432 10*3/uL — ABNORMAL HIGH (ref 150–400)
RBC: 4.65 MIL/uL (ref 3.87–5.11)
RDW: 13.6 % (ref 11.5–15.5)
WBC: 15.3 10*3/uL — ABNORMAL HIGH (ref 4.0–10.5)
nRBC: 0 % (ref 0.0–0.2)

## 2019-11-19 LAB — LACTIC ACID, PLASMA: Lactic Acid, Venous: 1.7 mmol/L (ref 0.5–1.9)

## 2019-11-19 LAB — AMMONIA: Ammonia: 19 umol/L (ref 9–35)

## 2019-11-19 LAB — URIC ACID: Uric Acid, Serum: 2.5 mg/dL (ref 2.5–7.1)

## 2019-11-19 NOTE — Progress Notes (Signed)
PROGRESS NOTE  Lynn [name] KXF:[id number] DOB: 1946-07-12 DOA: 11/10/2019 PCP: Marin Olp, MD  Brief Narrative:  [phone] with a history of HTN, HLD, and depression/anxiety who presented with dysarthria/expressive aphasia and confusion.  She also felt that her left arm was weak.  She has no prior history of CVA.  In the ED she was confirmedto have left arm weakness and slurred speech.  MRI confirmed bilateral CVAs.  Neurology was consulted and [facility] admitted the patient.   Significant Events: 4/20 admit via [place] for CVA 4/21 TTE EF 60-65% 4/22 diet advanced to dysphagia 1 with nectar thick liquids  Antimicrobials:  None  HPI/Recap of past 24 hours:  She is very weak, denies pain, no hypoxia Had bm x1 Has dysarthria  + hypophonia  Assessment/Plan: Principal Problem:   CVA (cerebral vascular accident) ([place]) Active Problems:   Hypertension   Hyperlipidemia   Depression   Hypokalemia   Leukocytosis   Acute ischemic bilateral CVAs - L>R BG and CR Presents with New left-sided weakness, facial droop, and expressive aphasia - initial CT suggested a possible left basal ganglia infarct and CTa was negative for large vessel occlusion or severe stenosis -subsequent MRI noted bilateral infarcts of the basal ganglia and corona radiata and mild small vessel disease - neurology directed her stroke evaluation - was taking aspirin 81 mg daily previously -acute strokes are felt to have been related to small vessel disease -  A1c not elevated at 5.1 -LDL elevated and treatment begun -  plan is to add Plavix to aspirin for 3 months then transition to Plavix only thereafter  -given bilateral involvement neurology has recommended a 30-day cardiac event monitor as an outpatient to rule out atrial fibrillation - PT/OT are recommending CIR and the patient is interested CIR recommend SNF level care for prolonged recovery, social worker consulted   Altered mental status  4/23 Suspect this was related to 1 dose of Norco that she was given -repeat acute CVA work-up was unrevealing -mental status dramatically improved  She is AAOx3 and oriented to situation this morning  Possible cognitive deficit Will need outpatient follow-up for possible cognitive decline/memory disorder -family reports this was present prior to this event  Dysphagia Consequence of CVA -cleared for D2 diet with nectar thick liquids by SLP initially and now upgraded to dysphagia 1 with nectar thick liquids - hopeful for further advancement of diet with ongoing speech therapy -speech improving  Acute urinary retention -Likely combination of neurogenic bladder, immobility, constipation -Treat reversible causes -Foley catheter placed 4/25 -Urecholine initiated , increase mobility, treat constipation, voiding trial in 24 hours to 48 hours  Hyponatremia and hypokalemia Replaced, normalized Repeat lab in the morning  Leukocytosis; no fever.  Does not appear septic Blood culture no growth cxr: 1. Low lung volumes with bibasilar subsegmental atelectasis. 2. Aortic atherosclerosis repeat CBC in the morning, monitor  Abdominal distension, though soft, does not appear tender, Constipation Elevated lft Will get kub/ab [place]  Right foot ankle pain foot x ray no acute findings  ankle x-ray showed "Soft tissue swelling. Suspect small avulsion arising from the medial talus. Evidence of old injury with remodeling lateral malleolus. Ankle mortise appears intact. Small inferior calcaneal spur noted."  Case discussed with Ortho on-call Dr. Maxie Better who recommended apply postop shoe and outpatient follow-up with Dr. Doran Durand  DVT Prophylaxis: Lovenox subcu 40 mg  Code Status: Full  Family Communication: Family meeting with patient's mother, sister and brother, case Freight forwarder present during  family meeting  Disposition Plan:    Patient came from:                        Home                                                                                    Anticipated d/c place:  SNF  Barriers to d/c OR conditions which need to be met to effect a safe d/c:  Needs SNF placement   Consultants:  CIR  Neurology  Procedures:  Foley insertion April 25  Antibiotics:  None   Objective: BP (!) 159/88 (BP Location: Right Arm)   Pulse 77   Temp (!) 97.4 F (36.3 C)   Resp 16   Ht 5' 3"  (1.6 m)   Wt 58 kg   LMP 07/23/1998 (Approximate)   SpO2 97%   BMI 22.65 kg/m   Intake/Output Summary (Last 24 hours) at 11/19/2019 1420 Last data filed at 11/19/2019 1100 Gross per 24 hour  Intake 240 ml  Output -  Net 240 ml   Filed Weights   11/10/19 1159  Weight: 58 kg    Exam: Patient is examined daily including today on 11/19/2019, exams remain the same as of yesterday except that has changed    General: Very weak but NAD,   Cardiovascular: RRR  Respiratory: Diminished at bases, poor respiratory effort, no wheezing, no rales, no rhonchi  Abdomen: positive BS, appear distended, though soft, nontender,   Musculoskeletal: No Edema  Neuro: alert, oriented x3, able to move her right upper extremity better than left upper extremity, bilateral lower extremity not able to lift against gravity, bilateral foot drop right more than left, right dorsal foot tenderness  Data Reviewed: Basic Metabolic Panel: Recent Labs  Lab 11/13/19 0332 11/13/19 0332 11/14/19 0057 11/16/19 0523 11/17/19 0204 11/18/19 0343 11/19/19 0240  NA 135   < > 134* 137 135 139 143  K 3.2*   < > 3.8 2.9* 3.8 4.4 4.2  CL 107   < > 102 106 106 108 108  CO2 20*   < > 21* 23 20* 12* 25  GLUCOSE 120*   < > 119* 102* 106* 123* 116*  BUN 15   < > 18 18 18 21  34*  CREATININE 0.66   < > 0.71 0.61 0.64 0.62 0.70  CALCIUM 8.7*   < > 9.1 8.4* 8.8* 9.1 9.5  MG 1.8  --   --  1.9  --  2.0  --    < > = values in this interval not displayed.   Liver Function Tests: Recent Labs  Lab 11/14/19 0057 11/18/19 0343  11/19/19 0240  AST 19 55* 42*  ALT 33 90* 82*  ALKPHOS 65 86 88  BILITOT 1.6* 0.8 0.8  PROT 5.9* 6.0* 5.9*  ALBUMIN 2.5* 2.3* 2.2*   No results for input(s): LIPASE, AMYLASE in the last 168 hours. Recent Labs  Lab 11/14/19 0057 11/19/19 0240  AMMONIA 17 19   CBC: Recent Labs  Lab 11/14/19 0057 11/18/19 0343 11/19/19 0240  WBC 16.5* 17.7* 15.3*  NEUTROABS  --  14.8* 12.4*  HGB 13.9 14.8 14.6  HCT 41.7 44.4 44.9  MCV 94.6 94.1 96.6  PLT 274 399 432*   Cardiac Enzymes:   No results for input(s): CKTOTAL, CKMB, CKMBINDEX, TROPONINI in the last 168 hours. BNP (last 3 results) No results for input(s): BNP in the last 8760 hours.  ProBNP (last 3 results) No results for input(s): PROBNP in the last 8760 hours.  CBG: Recent Labs  Lab 11/13/19 2237  GLUCAP 127*    Recent Results (from the past 240 hour(s))  SARS CORONAVIRUS 2 (TAT 6-24 HRS) Nasopharyngeal Nasopharyngeal Swab     Status: None   Collection Time: 11/10/19  1:34 PM   Specimen: Nasopharyngeal Swab  Result Value Ref Range Status   SARS Coronavirus 2 NEGATIVE NEGATIVE Final    Comment: (NOTE) SARS-CoV-2 target nucleic acids are NOT DETECTED. The SARS-CoV-2 RNA is generally detectable in upper and lower respiratory specimens during the acute phase of infection. Negative results do not preclude SARS-CoV-2 infection, do not rule out co-infections with other pathogens, and should not be used as the sole basis for treatment or other patient management decisions. Negative results must be combined with clinical observations, patient history, and epidemiological information. The expected result is Negative. Fact Sheet for Patients: SugarRoll.be Fact Sheet for Healthcare Providers: https://www.woods-mathews.com/ This test is not yet approved or cleared by the [place] FDA and  has been authorized for detection and/or diagnosis of SARS-CoV-2 by FDA under an Emergency  Use Authorization (EUA). This EUA will remain  in effect (meaning this test can be used) for the duration of the COVID-19 declaration under Section 56 4(b)(1) of the Act, 21 U.S.C. section 360bbb-3(b)(1), unless the authorization is terminated or revoked sooner. Performed at [facility], [place] [address] [place], [place] [address]   Culture, blood (routine x 2)     Status: None   Collection Time: 11/14/19 12:57 AM   Specimen: BLOOD  Result Value Ref Range Status   Specimen Description BLOOD LEFT ANTECUBITAL  Final   Special Requests   Final    BOTTLES DRAWN AEROBIC AND ANAEROBIC Blood Culture results may not be optimal due to an excessive volume of blood received in culture bottles   Culture   Final    NO GROWTH 5 DAYS Performed at [facility], [place] [address] [place], [place] [address]    Report Status 11/19/2019 FINAL  Final  Culture, blood (routine x 2)     Status: None   Collection Time: 11/14/19  1:00 AM   Specimen: BLOOD  Result Value Ref Range Status   Specimen Description BLOOD LEFT ANTECUBITAL  Final   Special Requests   Final    BOTTLES DRAWN AEROBIC AND ANAEROBIC Blood Culture results may not be optimal due to an excessive volume of blood received in culture bottles   Culture   Final    NO GROWTH 5 DAYS Performed at [facility], [place] [address], [place], [place] [address]    Report Status 11/19/2019 FINAL  Final     Studies: No results found.  Scheduled Meds: . aspirin EC  81 mg Oral Daily  . atorvastatin  40 mg Oral Daily  . clopidogrel  75 mg Oral Daily  . enoxaparin (LOVENOX) injection  40 mg Subcutaneous Q24H  . metoprolol tartrate  12.5 mg Oral BID  . polyethylene glycol  17 g Oral BID  . senna-docusate  1 tablet Oral BID    Continuous Infusions:   Time spent:  73mns I have personally reviewed and interpreted on  11/19/2019 daily labs, tele strips, imagings as discussed above under date review session and assessment and plans.  I  reviewed all nursing notes, pharmacy notes, consultant notes,  vitals, pertinent old records  I have discussed plan of care as described above with RN , patient and family on 11/19/2019   FFlorencia ReasonsMD, PhD, FACP  [facility]  Available via Epic secure chat 7am-7pm for nonurgent issues Please page for urgent issues, pager number available through [email or web address] .   11/19/2019, 2:20 PM  LOS: 9 days

## 2019-11-19 NOTE — Progress Notes (Signed)
Nutrition Follow-up  DOCUMENTATION CODES:   Non-severe (moderate) malnutrition in context of chronic illness  INTERVENTION:   - Increase Vital Cuisine Shake to TID, each supplement provides 520 kcal and 22 grams of protein  - Encourage adequate PO intake  - Feeding assistance as needed  NUTRITION DIAGNOSIS:   Moderate Malnutrition related to chronic illness as evidenced by mild fat depletion, moderate fat depletion, mild muscle depletion, moderate muscle depletion.  New diagnosis after completion of NFPE  GOAL:   Patient will meet greater than or equal to 90% of their needs  Progressing  MONITOR:   PO intake, Supplement acceptance, Diet advancement, Labs, Weight trends  REASON FOR ASSESSMENT:   Consult Other (stroke)  ASSESSMENT:   74 year old female who presented on 4/20 with transient dysarthria/expressive aphasia and confusion. PMH of HTN, HLD, depression/anxiety. Pt found to have acute ischemic bilateral CVAs.  4/22 - diet downgraded to dysphagia 1 with nectar-thick liquids 4/23 - rapid response due to increased somnolence  Visited pt in room. No family present at time of RD visit. Pt indicates that her appetite is okay and that she is eating well at meals.  RD will increase Hormel Shake supplements from BID to TID.  No new weights since admission.  Spoke with RN who reports pt consumed 75% of breakfast food this morning and drank milk. RN provided feeding assistance.  Meal Completion: 20% x 1 recorded meal (multiple meals missing)  Medications reviewed and include: miralax, senna  Labs reviewed: elevated LFTs  NUTRITION - FOCUSED PHYSICAL EXAM:    Most Recent Value  Orbital Region  Moderate depletion  Upper Arm Region  Moderate depletion  Thoracic and Lumbar Region  Moderate depletion  Buccal Region  Mild depletion  Temple Region  Mild depletion  Clavicle Bone Region  Moderate depletion  Clavicle and Acromion Bone Region  Moderate depletion   Scapular Bone Region  Unable to assess  Dorsal Hand  Mild depletion  Patellar Region  Moderate depletion  Anterior Thigh Region  Moderate depletion  Posterior Calf Region  Moderate depletion  Edema (RD Assessment)  None  Hair  Reviewed  Eyes  Reviewed  Mouth  Reviewed  Skin  Reviewed  Nails  Reviewed      Diet Order:   Diet Order            DIET - DYS 1 Room service appropriate? No; Fluid consistency: Nectar Thick  Diet effective now              EDUCATION NEEDS:   Education needs have been addressed  Skin:  Skin Assessment: Reviewed RN Assessment  Last BM:  11/18/19 large type 4  Height:   Ht Readings from Last 1 Encounters:  11/10/19 5\' 3"  (1.6 m)    Weight:   Wt Readings from Last 1 Encounters:  11/10/19 58 kg    Ideal Body Weight:  52.3 kg  BMI:  Body mass index is 22.65 kg/m.  Estimated Nutritional Needs:   Kcal:  1500-1700  Protein:  70-85 grams  Fluid:  1.5-1.7 L    Gaynell Face, MS, RD, LDN Inpatient Clinical Dietitian Pager: [phone] Weekend/After Hours: [phone]

## 2019-11-19 NOTE — Progress Notes (Signed)
Occupational Therapy Treatment Patient Details Name: Lynn [name] MRN: [id number] DOB: [birth year] Today's Date: 11/19/2019    History of present illness Pt is a 74 y/o female admitted secondary to L arm weakness and speech difficulty. MRI revealed Patchy acute infarcts involving the left greater than right basal ganglia and corona radiata. PMH includes HTN and depression.   OT comments  Patient continues to make steady progress towards goals in skilled OT session. Patient's session encompassed unsupported sitting at EOB and transfers with PT in order to progress towards increased abilities and activity tolerance. Pt noted to be with flat affect throughout session, often not responding to therapist's prompts with more than a nod. Pt able to hold unsupported sitting with max cues for 45 seconds x1 and 30 seconds x1. Pt continues to fatigue quickly, and is dependent on increased encouragement. Once transferred, therapist's facilitated sit to stands x2 to promote increased weight through BLEs. Pt dependent on therapist's for support and to weight shift side to side. Discharge recommendations changed to SNF due to denial by CIR; will continue to follow acutely.    Follow Up Recommendations  SNF;Supervision/Assistance - 24 hour    Equipment Recommendations  Other (comment)(Defer to next venue)    Recommendations for Other Services      Precautions / Restrictions Precautions Precautions: Fall Restrictions Weight Bearing Restrictions: No       Mobility Bed Mobility Overal bed mobility: Needs Assistance Bed Mobility: Rolling;Sidelying to Sit Rolling: +2 for safety/equipment;Max assist Sidelying to sit: +2 for physical assistance;Max assist       General bed mobility comments: Rolling both sides for ADLs - cues to look and reach for rolls.  Cues to push up for sidelying to sit.  Did require max x 2  Transfers Overall transfer level: Needs assistance Equipment used: 2 person  hand held assist Transfers: Sit to/from Stand;Lateral/Scoot Transfers Sit to Stand: Max assist;+2 physical assistance        Lateral/Scoot Transfers: Max assist;+2 physical assistance General transfer comment: Performed sit to stand x 2 with knees blocked and faciliation at buttock and shoulders for posture.  Good effort from pt.  Max A x 2 lateral scoot to chair.    Balance Overall balance assessment: Needs assistance Sitting-balance support: Feet supported;Bilateral upper extremity supported Sitting balance-Leahy Scale: Fair Sitting balance - Comments: Pt sat at EOB and edge of chair today for about 5 mins each.  She was able to maintain upright posture without support for about 45 seconds today with verbal cues to correct posture.   Standing balance support: Bilateral upper extremity supported;During functional activity Standing balance-Leahy Scale: Zero Standing balance comment: Dependent on therapists support to complete 2 sit to stands                           ADL either performed or assessed with clinical judgement   ADL Overall ADL's : Needs assistance/impaired Eating/Feeding: Moderate assistance;Sitting Eating/Feeding Details (indicate cue type and reason): Assisted by sister Grooming: Brushing hair;Maximal assistance;Sitting Grooming Details (indicate cue type and reason): Assist provided by sister, education provided to aid in use of LUE                 Toilet Transfer: Total assistance;+2 for physical assistance;+2 for safety/equipment;Squat-pivot;BSC Toilet Transfer Details (indicate cue type and reason): simulated to chair, drop arm recliner used and gait belt Toileting- Clothing Manipulation and Hygiene: Total assistance;Bed level Toileting - Clothing Manipulation Details (indicate cue type  and reason): Peri care required upon arrival     Functional mobility during ADLs: +2 for safety/equipment;+2 for physical assistance;Total assistance General  ADL Comments: Pt with increased capability to complete unsupported sitting (45 seconds first trial, 30 seconds second trial) in session to date, flat affect noted throughout session, increased encouragment needed     Vision       Perception     Praxis      Cognition Arousal/Alertness: Awake/alert Behavior During Therapy: WFL for tasks assessed/performed;Flat affect Overall Cognitive Status: Impaired/Different from baseline Area of Impairment: Attention;Following commands;Problem solving;Memory                   Current Attention Level: Sustained Memory: Decreased recall of precautions Following Commands: Follows one step commands with increased time     Problem Solving: Slow processing;Decreased initiation;Difficulty sequencing;Requires verbal cues;Requires tactile cues General Comments: L inattention, flat affect (often not responding to prompts)        Exercises General Exercises - Lower Extremity Ankle Circles/Pumps: PROM;Right;AAROM;Left;10 reps;Seated Long Arc Quad: Both;AAROM;Seated;20 reps(assist to complete full ROM, less A on R) Heel Slides: AAROM;Both;10 reps;Supine   Shoulder Instructions       General Comments VSS.  Pt's sister present    Pertinent Vitals/ Pain       Pain Assessment: Faces Faces Pain Scale: Hurts a little bit Pain Location: Bil knee with mobility Pain Descriptors / Indicators: Grimacing Pain Intervention(s): Limited activity within patient's tolerance;Monitored during session  Home Living                                          Prior Functioning/Environment              Frequency  Min 3X/week        Progress Toward Goals  OT Goals(current goals can now be found in the care plan section)  Progress towards OT goals: Progressing toward goals  Acute Rehab OT Goals Patient Stated Goal: to be independent OT Goal Formulation: With patient/family Time For Goal Achievement: 11/26/19 Potential to  Achieve Goals: Good  Plan Frequency remains appropriate;Discharge plan needs to be updated    Co-evaluation    PT/OT/SLP Co-Evaluation/Treatment: Yes Reason for Co-Treatment: Complexity of the patient's impairments (multi-system involvement);For patient/therapist safety;To address functional/ADL transfers PT goals addressed during session: Mobility/safety with mobility;Balance OT goals addressed during session: ADL's and self-care      AM-PAC OT "6 Clicks" Daily Activity     Outcome Measure   Help from another person eating meals?: A Lot Help from another person taking care of personal grooming?: A Lot Help from another person toileting, which includes using toliet, bedpan, or urinal?: Total Help from another person bathing (including washing, rinsing, drying)?: A Lot Help from another person to put on and taking off regular upper body clothing?: A Lot Help from another person to put on and taking off regular lower body clothing?: Total 6 Click Score: 10    End of Session Equipment Utilized During Treatment: Gait belt  OT Visit Diagnosis: Muscle weakness (generalized) (M62.81);Hemiplegia and hemiparesis;Pain;Other symptoms and signs involving cognitive function Hemiplegia - Right/Left: Left Hemiplegia - dominant/non-dominant: Non-Dominant Hemiplegia - caused by: Cerebral infarction Pain - Right/Left: Left Pain - part of body: Knee   Activity Tolerance Patient tolerated treatment well;Patient limited by lethargy   Patient Left in chair;with call bell/phone within reach;with chair alarm set;with family/visitor present  Nurse Communication Mobility status        Time: [id number] OT Time Calculation (min): 29 min  Charges: OT General Charges $OT Visit: 1 Visit OT Treatments $Self Care/Home Management : 8-22 mins  Corinne Ports E. Judythe Postema, COTA/L Acute Rehabilitation Services [place] 11/19/2019, 2:45 PM

## 2019-11-19 NOTE — Progress Notes (Signed)
Physical Therapy Treatment Patient Details Name: Lynn [name] MRN: [id number] DOB: 1945/10/26 Today's Date: 11/19/2019    History of Present Illness Pt is a 74 y/o female admitted secondary to L arm weakness and speech difficulty. MRI revealed Patchy acute infarcts involving the left greater than right basal ganglia and corona radiata. PMH includes HTN and depression.    PT Comments    Pt making good progress today.  She demonstrated good improvements/tolerance in EOB balance and endurance.  Demonstrated improved activation with exercises.  Continues to need assist of 2, facilitation and cues for transfer techniques and mobility.  Cont POC.    Follow Up Recommendations  CIR;Supervision/Assistance - 24 hour     Equipment Recommendations  Other (comment)(TBA next venue)    Recommendations for Other Services       Precautions / Restrictions Precautions Precautions: Fall    Mobility  Bed Mobility Overal bed mobility: Needs Assistance Bed Mobility: Rolling;Sidelying to Sit Rolling: +2 for safety/equipment;Max assist Sidelying to sit: +2 for physical assistance;Max assist       General bed mobility comments: Rolling both sides for ADLs - cues to look and reach for rolls.  Cues to push up for sidelying to sit.  Did require max x 2  Transfers Overall transfer level: Needs assistance Equipment used: 2 person hand held assist Transfers: Sit to/from Stand;Lateral/Scoot Transfers Sit to Stand: Max assist;+2 physical assistance        Lateral/Scoot Transfers: Max assist;+2 physical assistance General transfer comment: Performed sit to stand x 2 with knees blocked and faciliation at buttock and shoulders for posture.  Good effort from pt.  Max A x 2 lateral scoot to chair.  Ambulation/Gait                 Stairs             Wheelchair Mobility    Modified Rankin (Stroke Patients Only) Modified Rankin (Stroke Patients Only) Pre-Morbid Rankin Score: No  symptoms Modified Rankin: Severe disability     Balance Overall balance assessment: Needs assistance Sitting-balance support: Feet supported;Bilateral upper extremity supported Sitting balance-Leahy Scale: Fair Sitting balance - Comments: Pt sat at EOB and edge of chair today for about 5 mins each.  She was able to maintain upright posture without support for about 45 seconds today with verbal cues to correct posture.     Standing balance-Leahy Scale: Zero                              Cognition Arousal/Alertness: Awake/alert Behavior During Therapy: WFL for tasks assessed/performed Overall Cognitive Status: Impaired/Different from baseline Area of Impairment: Attention;Following commands;Problem solving;Memory                     Memory: Decreased recall of precautions Following Commands: Follows one step commands with increased time     Problem Solving: Slow processing;Decreased initiation;Difficulty sequencing;Requires verbal cues;Requires tactile cues General Comments: L inattention      Exercises General Exercises - Lower Extremity Ankle Circles/Pumps: PROM;Right;AAROM;Left;10 reps;Seated Long Arc Quad: Both;AAROM;Seated;20 reps(assist to complete full ROM, less A on R) Heel Slides: AAROM;Both;10 reps;Supine    General Comments General comments (skin integrity, edema, etc.): VSS.  Pt's sister present      Pertinent Vitals/Pain Pain Assessment: Faces Faces Pain Scale: Hurts a little bit Pain Location: Bil knee with mobility Pain Descriptors / Indicators: Grimacing Pain Intervention(s): Limited activity within patient's tolerance;Monitored during session  Home Living                      Prior Function            PT Goals (current goals can now be found in the care plan section) Acute Rehab PT Goals Patient Stated Goal: to be independent PT Goal Formulation: With patient Time For Goal Achievement: 11/25/19 Potential to Achieve  Goals: Good Progress towards PT goals: Progressing toward goals    Frequency    Min 4X/week      PT Plan Current plan remains appropriate    Co-evaluation PT/OT/SLP Co-Evaluation/Treatment: Yes Reason for Co-Treatment: Complexity of the patient's impairments (multi-system involvement);For patient/therapist safety PT goals addressed during session: Mobility/safety with mobility;Balance OT goals addressed during session: ADL's and self-care      AM-PAC PT "6 Clicks" Mobility   Outcome Measure  Help needed turning from your back to your side while in a flat bed without using bedrails?: Total Help needed moving from lying on your back to sitting on the side of a flat bed without using bedrails?: Total Help needed moving to and from a bed to a chair (including a wheelchair)?: Total Help needed standing up from a chair using your arms (e.g., wheelchair or bedside chair)?: Total Help needed to walk in hospital room?: Total Help needed climbing 3-5 steps with a railing? : Total 6 Click Score: 6    End of Session Equipment Utilized During Treatment: Gait belt Activity Tolerance: Patient tolerated treatment well Patient left: with call bell/phone within reach;with family/visitor present;with chair alarm set;in chair(pillows to support posture and UEs) Nurse Communication: Mobility status PT Visit Diagnosis: Unsteadiness on feet (R26.81);Hemiplegia and hemiparesis;Muscle weakness (generalized) (M62.81);Pain Hemiplegia - Right/Left: Left Hemiplegia - dominant/non-dominant: Non-dominant Hemiplegia - caused by: Cerebral infarction Pain - Right/Left: Right     Time: [id number] PT Time Calculation (min) (ACUTE ONLY): 29 min  Charges:  $Therapeutic Activity: 8-22 mins                     Maggie Font, PT Acute Rehab Services Pager [phone] [facility] [phone]    Lynn [name] 11/19/2019, 2:14 PM

## 2019-11-20 LAB — COMPREHENSIVE METABOLIC PANEL
ALT: 66 U/L — ABNORMAL HIGH (ref 0–44)
AST: 30 U/L (ref 15–41)
Albumin: 2.3 g/dL — ABNORMAL LOW (ref 3.5–5.0)
Alkaline Phosphatase: 80 U/L (ref 38–126)
Anion gap: 10 (ref 5–15)
BUN: 38 mg/dL — ABNORMAL HIGH (ref 8–23)
CO2: 24 mmol/L (ref 22–32)
Calcium: 9.3 mg/dL (ref 8.9–10.3)
Chloride: 110 mmol/L (ref 98–111)
Creatinine, Ser: 0.63 mg/dL (ref 0.44–1.00)
GFR calc Af Amer: >60 mL/min (ref >60)
GFR calc non Af Amer: >60 mL/min (ref >60)
Glucose, Bld: 118 mg/dL — ABNORMAL HIGH (ref 70–99)
Potassium: 3.4 mmol/L — ABNORMAL LOW (ref 3.5–5.1)
Sodium: 144 mmol/L (ref 135–145)
Total Bilirubin: 0.6 mg/dL (ref 0.3–1.2)
Total Protein: 5.7 g/dL — ABNORMAL LOW (ref 6.5–8.1)

## 2019-11-20 LAB — CBC WITH DIFFERENTIAL/PLATELET
Abs Immature Granulocytes: 0.23 10*3/uL — ABNORMAL HIGH (ref 0.00–0.07)
Basophils Absolute: 0.1 10*3/uL (ref 0.0–0.1)
Basophils Relative: 1 %
Eosinophils Absolute: 0.3 10*3/uL (ref 0.0–0.5)
Eosinophils Relative: 2 %
HCT: 42.2 % (ref 36.0–46.0)
Hemoglobin: 13.7 g/dL (ref 12.0–15.0)
Immature Granulocytes: 1 %
Lymphocytes Relative: 6 %
Lymphs Abs: 1 10*3/uL (ref 0.7–4.0)
MCH: 31.1 pg (ref 26.0–34.0)
MCHC: 32.5 g/dL (ref 30.0–36.0)
MCV: 95.9 fL (ref 80.0–100.0)
Monocytes Absolute: 1.7 10*3/uL — ABNORMAL HIGH (ref 0.1–1.0)
Monocytes Relative: 10 %
Neutro Abs: 14.3 10*3/uL — ABNORMAL HIGH (ref 1.7–7.7)
Neutrophils Relative %: 80 %
Platelets: 452 10*3/uL — ABNORMAL HIGH (ref 150–400)
RBC: 4.4 MIL/uL (ref 3.87–5.11)
RDW: 13.5 % (ref 11.5–15.5)
WBC: 17.6 10*3/uL — ABNORMAL HIGH (ref 4.0–10.5)
nRBC: 0 % (ref 0.0–0.2)

## 2019-11-20 LAB — CK: Total CK: 35 U/L — ABNORMAL LOW (ref 38–234)

## 2019-11-20 MED ORDER — METOPROLOL TARTRATE 25 MG PO TABS
25.0000 mg | ORAL_TABLET | Freq: Two times a day (BID) | ORAL | Status: DC
  Administered 2019-11-20 – 2019-11-24 (×9): 25 mg via ORAL
  Filled 2019-11-20 (×9): qty 1

## 2019-11-20 NOTE — Progress Notes (Signed)
Received report and was told patient was off and on yellow MEWS,  Its due to her respirations when she is being turned or moved around in her bed.

## 2019-11-20 NOTE — Plan of Care (Signed)
  Problem: Education: Goal: Knowledge of disease or condition will improve Outcome: Progressing Goal: Knowledge of secondary prevention will improve Outcome: Progressing Goal: Knowledge of patient specific risk factors addressed and post discharge goals established will improve Outcome: Progressing Goal: Individualized Educational Video(s) Outcome: Progressing   Problem: Coping: Goal: Will verbalize positive feelings about self Outcome: Progressing Goal: Will identify appropriate support needs Outcome: Progressing   Problem: Health Behavior/Discharge Planning: Goal: Ability to manage health-related needs will improve Outcome: Progressing   Problem: Self-Care: Goal: Ability to participate in self-care as condition permits will improve Outcome: Progressing Goal: Verbalization of feelings and concerns over difficulty with self-care will improve Outcome: Progressing Goal: Ability to communicate needs accurately will improve Outcome: Progressing   Problem: Education: Goal: Knowledge of General Education information will improve Description: Including pain rating scale, medication(s)/side effects and non-pharmacologic comfort measures Outcome: Progressing   Problem: Health Behavior/Discharge Planning: Goal: Ability to manage health-related needs will improve Outcome: Progressing   Problem: Clinical Measurements: Goal: Ability to maintain clinical measurements within normal limits will improve Outcome: Progressing Goal: Will remain free from infection Outcome: Progressing Goal: Diagnostic test results will improve Outcome: Progressing Goal: Respiratory complications will improve Outcome: Progressing Goal: Cardiovascular complication will be avoided Outcome: Progressing   Problem: Activity: Goal: Risk for activity intolerance will decrease Outcome: Progressing   Problem: Nutrition: Goal: Adequate nutrition will be maintained Outcome: Progressing   Problem:  Coping: Goal: Level of anxiety will decrease Outcome: Progressing   Problem: Elimination: Goal: Will not experience complications related to bowel motility Outcome: Progressing Goal: Will not experience complications related to urinary retention Outcome: Progressing   Problem: Pain Managment: Goal: General experience of comfort will improve Outcome: Progressing   Problem: Safety: Goal: Ability to remain free from injury will improve Outcome: Progressing   Problem: Skin Integrity: Goal: Risk for impaired skin integrity will decrease Outcome: Progressing

## 2019-11-20 NOTE — Progress Notes (Signed)
PT Cancellation Note  Patient Details Name: Lynn [name] MRN: [id number] DOB: May 13, 1946   Cancelled Treatment:    Reason Eval/Treat Not Completed: Other (comment) Pt in bed and lethargic.  She declined PT today -stating that she needs a day of rest.  Attempted to encourage to participate but pt closing eye.  No family present at this time.  Will f/u as able. Maggie Font, PT Acute Rehab Services Pager [phone] [facility] [phone]   Karlton Lemon 11/20/2019, 3:07 PM

## 2019-11-20 NOTE — Progress Notes (Signed)
  Speech Language Pathology Treatment: Dysphagia;Cognitive-Linquistic  Patient Details Name: Lynn [name] MRN: [id number] DOB: Dec 30, 1945 Today's Date: 11/20/2019 Time: 1137-1202 SLP Time Calculation (min) (ACUTE ONLY): 25 min  Assessment / Plan / Recommendation Clinical Impression  Skilled treatment focused on language, speech and swallow with pt's brother and mother present. On arrival pt's head of bed approximately 30 degrees with mom administering thin water via straw and pt coughing. Repositioned and discussed importance of upright position demonstrating reverse trendelenburg function on bed to help when pt's back is hurting. Re-educated need to avoid thin liquids and recommendation for nectar thick. SLP explained SLP will give trials of  thin water this session for therapeutic use and subjective information re: swallow. Brother reported when she is upright taking multiple straw sips nectar she hasn't coughed. He asked if they should have her take 1-3 multiple sips versus more than 4 and affirmed smaller sips. Although MBS was 7 days ago repeat if not yet recommended as she exhibited explosive and prolonged cough with 1 of 4 sips thin. She may be able to perform chin tuck with what appears to be limited cervical ROM with tuck. Discussed puree diet and she prefers to remain on puree rather than trialing upgraded texture presently.   Therapy worked towards increasing length of utterance from 1-2 words to complete sentences. Aphasia combined with decreased breath to support longer phonation. Verbal cues needed to identify need for appropriate pronouns, articles prepositions in speech. Naming of flowers in bouquet in room 100%, responsive naming to biographical questions. Appeared to have left inattention but looked to see her mother on her left side spontaneously. Therapy to focus on expounding language ability to full sentences in addition to dysarthria, cognition and dysphagia.     HPI HPI:  Lynn [name] is a 74 y.o. female with medical history significant of HTN and depression presenting with "slow thinking."  She reports she had difficulty talking after waking up.  Her mother's bedroom is downstairs and she came to the head of the stairs and couldn't figure out how to do this and appeared to have difficulty speaking.  +expressive aphasia.   Her left arm is feeling weak.  She had a headache 2 weeks ago.  No h/o prior CVA.  Yesterday was a perfectly normal day.    She was previously healthy and independent until about 2-3 years ago, and has slowed down in the last few years since moving in to her mother's home.  MRI + for B infarcts in basal ganglia and corona radiata. Pt       SLP Plan  Continue with current plan of care       Recommendations  Diet recommendations: Dysphagia 1 (puree);Nectar-thick liquid Liquids provided via: Teaspoon Medication Administration: Crushed with puree Supervision: Full supervision/cueing for compensatory strategies;Patient able to self feed Compensations: Slow rate;Small sips/bites;Follow solids with liquid;Multiple dry swallows after each bite/sip;Effortful swallow Postural Changes and/or Swallow Maneuvers: Seated upright 90 degrees                Oral Care Recommendations: Oral care BID Follow up Recommendations: 24 hour supervision/assistance SLP Visit Diagnosis: Dysphagia, oropharyngeal phase (R13.12);Dysarthria and anarthria (R47.1);Cognitive communication deficit [id number]) Plan: Continue with current plan of care                       Houston Siren 11/20/2019, 12:38 PM  Orbie Pyo Colvin Caroli.Ed Risk analyst [phone] Office [phone]

## 2019-11-20 NOTE — Progress Notes (Signed)
Patient slept a lot today.  Her family helped her eat lunch and dinner and she did well.  Patient has clearer speech tonight than she had this morning.

## 2019-11-20 NOTE — Progress Notes (Addendum)
PROGRESS NOTE  Lynn [name]:[id number] DOB: 09/24/1945 DOA: 11/10/2019 PCP: Marin Olp, MD  Brief Narrative:  [phone] with a history of HTN, HLD, and depression/anxiety who presented with dysarthria/expressive aphasia and confusion.  She also felt that her left arm was weak.  She has no prior history of CVA.  In the ED she was confirmedto have left arm weakness and slurred speech.  MRI confirmed bilateral CVAs.  Neurology was consulted and [facility] admitted the patient.   Significant Events: 4/20 admit via [facility] for CVA 4/21 TTE EF 60-65% 4/22 diet advanced to dysphagia 1 with nectar thick liquids  Antimicrobials:  None  HPI/Recap of past 24 hours:  She is very weak, denies pain, no hypoxia + dysarthria  + hypophonia  Assessment/Plan: Principal Problem:   CVA (cerebral vascular accident) ([place]) Active Problems:   Hypertension   Hyperlipidemia   Depression   Hypokalemia   Leukocytosis   Acute ischemic bilateral CVAs - L>R BG and CR Presents with New left-sided weakness, facial droop, and expressive aphasia - initial CT suggested a possible left basal ganglia infarct and CTa was negative for large vessel occlusion or severe stenosis -subsequent MRI noted bilateral infarcts of the basal ganglia and corona radiata and mild small vessel disease - neurology directed her stroke evaluation - was taking aspirin 81 mg daily previously -acute strokes are felt to have been related to small vessel disease -  A1c not elevated at 5.1 -LDL elevated and treatment begun -  plan is to add Plavix to aspirin for 3 months then transition to Plavix only thereafter  -given bilateral involvement neurology has recommended a 30-day cardiac event monitor as an outpatient to rule out atrial fibrillation - (message sent to cards master) PT/OT are recommending CIR and the patient is interested CIR recommend SNF level care for prolonged recovery, social worker  consulted   Altered mental status 4/23 Suspect this was related to 1 dose of Norco that she was given -repeat acute CVA work-up was unrevealing -mental status dramatically improved  She is AAOx3 and oriented to situation this morning  Possible cognitive deficit Will need outpatient follow-up for possible cognitive decline/memory disorder -family reports this was present prior to this event  Dysphagia Consequence of CVA -cleared for D2 diet with nectar thick liquids by SLP initially and now upgraded to dysphagia 1 with nectar thick liquids - hopeful for further advancement of diet with ongoing speech therapy -speech improving  Acute urinary retention -Likely combination of neurogenic bladder, immobility, constipation -Treat reversible causes -Foley catheter placed 4/25 -Urecholine initiated , increase mobility, treat constipation, voiding trial in 24 hours to 48 hours  Hyponatremia and hypokalemia Replaced, normalized Repeat lab in the morning  Leukocytosis; no fever.  Does not appear septic Blood culture no growth cxr: 1. Low lung volumes with bibasilar subsegmental atelectasis. 2. Aortic atherosclerosis repeat CBC in the morning, monitor  Abdominal distension, though soft, does not appear tender, Constipation, resolved Elevated lft, trending down kub/ab US unremarkable  Right foot ankle pain foot x ray no acute findings  ankle x-ray showed "Soft tissue swelling. Suspect small avulsion arising from the medial talus. Evidence of old injury with remodeling lateral malleolus. Ankle mortise appears intact. Small inferior calcaneal spur noted."  Case discussed with Ortho on-call Dr. Maxie Better who recommended apply postop shoe and outpatient follow-up with Dr. Doran Durand  DVT Prophylaxis: Lovenox subcu 40 mg  Code Status: Full  Family Communication: Family meeting with patient's mother, sister and brother,  case manager present during family meeting on 4/29  Disposition Plan:     Patient came from:                        Home                                                                                   Anticipated d/c place:  SNF  Barriers to d/c OR conditions which need to be met to effect a safe d/c:  Needs SNF placement, awaiting for bed/insurance approval   Consultants:  CIR  Neurology  Procedures:  Foley insertion April 25  Antibiotics:  None   Objective: BP (!) 173/87 (BP Location: Left Arm)   Pulse 77   Temp 98.1 F (36.7 C)   Resp 16   Ht _0  (1.6 m)   Wt 58 kg   LMP 07/23/1998 (Approximate)   SpO2 97%   BMI 22.65 kg/m   Intake/Output Summary (Last 24 hours) at 11/20/2019 3016 Last data filed at 11/20/2019 0109 Gross per 24 hour  Intake 240 ml  Output 380 ml  Net -140 ml   Filed Weights   11/10/19 1159  Weight: 58 kg    Exam: Patient is examined daily including today on 11/20/2019, exams remain the same as of yesterday except that has changed    General: Very weak but NAD,   Cardiovascular: RRR  Respiratory: Diminished at bases, poor respiratory effort, no wheezing, no rales, no rhonchi  Abdomen: positive BS, appear distended, though soft, nontender,   Musculoskeletal: No Edema  Neuro: alert, oriented x3, able to move her right upper extremity better than left upper extremity, bilateral lower extremity not able to lift against gravity, bilateral foot drop right more than left, right dorsal foot tenderness  Data Reviewed: Basic Metabolic Panel: Recent Labs  Lab 11/16/19 0523 11/17/19 0204 11/18/19 0343 11/19/19 0240 11/20/19 0332  NA 137 135 139 143 144  K 2.9* 3.8 4.4 4.2 3.4*  CL 106 106 108 108 110  CO2 23 20* 12* 25 24  GLUCOSE 102* 106* 123* 116* 118*  BUN _1 34* 38*  CREATININE 0.61 0.64 0.62 0.70 0.63  CALCIUM 8.4* 8.8* 9.1 9.5 9.3  MG 1.9  --  2.0  --   --    Liver Function Tests: Recent Labs  Lab 11/14/19 0057 11/18/19 0343 11/19/19 0240 11/20/19 0332  AST 19 55* 42* 30   ALT 33 90* 82* 66*  ALKPHOS 65 86 88 80  BILITOT 1.6* 0.8 0.8 0.6  PROT 5.9* 6.0* 5.9* 5.7*  ALBUMIN 2.5* 2.3* 2.2* 2.3*   No results for input(s): LIPASE, AMYLASE in the last 168 hours. Recent Labs  Lab 11/14/19 0057 11/19/19 0240  AMMONIA 17 19   CBC: Recent Labs  Lab 11/14/19 0057 11/18/19 0343 11/19/19 0240 11/20/19 0332  WBC 16.5* 17.7* 15.3* 17.6*  NEUTROABS  --  14.8* 12.4* 14.3*  HGB 13.9 14.8 14.6 13.7  HCT 41.7 44.4 44.9 42.2  MCV 94.6 94.1 96.6 95.9  PLT 274 399 432* 452*   Cardiac Enzymes:   Recent Labs  Lab 11/20/19 0332  CKTOTAL 35*   BNP (last 3 results) No results for input(s): BNP in the last 8760 hours.  ProBNP (last 3 results) No results for input(s): PROBNP in the last 8760 hours.  CBG: Recent Labs  Lab 11/13/19 2237  GLUCAP 127*    Recent Results (from the past 240 hour(s))  SARS CORONAVIRUS 2 (TAT 6-24 HRS) Nasopharyngeal Nasopharyngeal Swab     Status: None   Collection Time: 11/10/19  1:34 PM   Specimen: Nasopharyngeal Swab  Result Value Ref Range Status   SARS Coronavirus 2 NEGATIVE NEGATIVE Final    Comment: (NOTE) SARS-CoV-2 target nucleic acids are NOT DETECTED. The SARS-CoV-2 RNA is generally detectable in upper and lower respiratory specimens during the acute phase of infection. Negative results do not preclude SARS-CoV-2 infection, do not rule out co-infections with other pathogens, and should not be used as the sole basis for treatment or other patient management decisions. Negative results must be combined with clinical observations, patient history, and epidemiological information. The expected result is Negative. Fact Sheet for Patients: SugarRoll.be Fact Sheet for Healthcare Providers: https://www.woods-mathews.com/ This test is not yet approved or cleared by the [place] FDA and  has been authorized for detection and/or diagnosis of SARS-CoV-2 by FDA under an  Emergency Use Authorization (EUA). This EUA will remain  in effect (meaning this test can be used) for the duration of the COVID-19 declaration under Section 56 4(b)(1) of the Act, 21 U.S.C. section 360bbb-3(b)(1), unless the authorization is terminated or revoked sooner. Performed at [facility], [place] [address], [place], [place] [address]   Culture, blood (routine x 2)     Status: None   Collection Time: 11/14/19 12:57 AM   Specimen: BLOOD  Result Value Ref Range Status   Specimen Description BLOOD LEFT ANTECUBITAL  Final   Special Requests   Final    BOTTLES DRAWN AEROBIC AND ANAEROBIC Blood Culture results may not be optimal due to an excessive volume of blood received in culture bottles   Culture   Final    NO GROWTH 5 DAYS Performed at [facility], [place] [address], [place], [place] [address]    Report Status 11/19/2019 FINAL  Final  Culture, blood (routine x 2)     Status: None   Collection Time: 11/14/19  1:00 AM   Specimen: BLOOD  Result Value Ref Range Status   Specimen Description BLOOD LEFT ANTECUBITAL  Final   Special Requests   Final    BOTTLES DRAWN AEROBIC AND ANAEROBIC Blood Culture results may not be optimal due to an excessive volume of blood received in culture bottles   Culture   Final    NO GROWTH 5 DAYS Performed at [facility], [place] [address], [place], [place] [address]    Report Status 11/19/2019 FINAL  Final     Studies: No results found.  Scheduled Meds: . aspirin EC  81 mg Oral Daily  . atorvastatin  40 mg Oral Daily  . clopidogrel  75 mg Oral Daily  . enoxaparin (LOVENOX) injection  40 mg Subcutaneous Q24H  . metoprolol tartrate  25 mg Oral BID  . polyethylene glycol  17 g Oral BID  . senna-docusate  1 tablet Oral BID    Continuous Infusions:   Time spent: 29mns I have personally reviewed and interpreted on  11/20/2019 daily labs, tele strips, imagings as discussed above under date review session and assessment and  plans.  I reviewed all nursing notes, pharmacy  notes, consultant notes,  vitals, pertinent old records  I have discussed plan of care as described above with RN , patient  on 11/20/2019   Florencia Reasons MD, PhD, FACP  [facility]  Available via Epic secure chat 7am-7pm for nonurgent issues Please page for urgent issues, pager number available through [email or web address] .   11/20/2019, 8:33 AM  LOS: 10 days

## 2019-11-21 DIAGNOSIS — E440 Moderate protein-calorie malnutrition: Secondary | ICD-10-CM | POA: Insufficient documentation

## 2019-11-21 LAB — COMPREHENSIVE METABOLIC PANEL
ALT: 51 U/L — ABNORMAL HIGH (ref 0–44)
AST: 26 U/L (ref 15–41)
Albumin: 2.4 g/dL — ABNORMAL LOW (ref 3.5–5.0)
Alkaline Phosphatase: 77 U/L (ref 38–126)
Anion gap: 8 (ref 5–15)
BUN: 29 mg/dL — ABNORMAL HIGH (ref 8–23)
CO2: 27 mmol/L (ref 22–32)
Calcium: 9.2 mg/dL (ref 8.9–10.3)
Chloride: 112 mmol/L — ABNORMAL HIGH (ref 98–111)
Creatinine, Ser: 0.65 mg/dL (ref 0.44–1.00)
GFR calc Af Amer: >60 mL/min (ref >60)
GFR calc non Af Amer: >60 mL/min (ref >60)
Glucose, Bld: 110 mg/dL — ABNORMAL HIGH (ref 70–99)
Potassium: 3.6 mmol/L (ref 3.5–5.1)
Sodium: 147 mmol/L — ABNORMAL HIGH (ref 135–145)
Total Bilirubin: 0.5 mg/dL (ref 0.3–1.2)
Total Protein: 5.7 g/dL — ABNORMAL LOW (ref 6.5–8.1)

## 2019-11-21 LAB — CBC WITH DIFFERENTIAL/PLATELET
Abs Immature Granulocytes: 0.22 10*3/uL — ABNORMAL HIGH (ref 0.00–0.07)
Basophils Absolute: 0.1 10*3/uL (ref 0.0–0.1)
Basophils Relative: 1 %
Eosinophils Absolute: 0.3 10*3/uL (ref 0.0–0.5)
Eosinophils Relative: 2 %
HCT: 44.6 % (ref 36.0–46.0)
Hemoglobin: 14 g/dL (ref 12.0–15.0)
Immature Granulocytes: 2 %
Lymphocytes Relative: 7 %
Lymphs Abs: 0.9 10*3/uL (ref 0.7–4.0)
MCH: 30.8 pg (ref 26.0–34.0)
MCHC: 31.4 g/dL (ref 30.0–36.0)
MCV: 98 fL (ref 80.0–100.0)
Monocytes Absolute: 1.2 10*3/uL — ABNORMAL HIGH (ref 0.1–1.0)
Monocytes Relative: 9 %
Neutro Abs: 10.2 10*3/uL — ABNORMAL HIGH (ref 1.7–7.7)
Neutrophils Relative %: 79 %
Platelets: 442 10*3/uL — ABNORMAL HIGH (ref 150–400)
RBC: 4.55 MIL/uL (ref 3.87–5.11)
RDW: 13.5 % (ref 11.5–15.5)
WBC: 12.9 10*3/uL — ABNORMAL HIGH (ref 4.0–10.5)
nRBC: 0 % (ref 0.0–0.2)

## 2019-11-21 LAB — PHOSPHORUS: Phosphorus: 2.8 mg/dL (ref 2.5–4.6)

## 2019-11-21 LAB — MAGNESIUM: Magnesium: 2.2 mg/dL (ref 1.7–2.4)

## 2019-11-21 MED ORDER — ENSURE ENLIVE PO LIQD
237.0000 mL | Freq: Two times a day (BID) | ORAL | Status: DC
  Administered 2019-11-21 – 2019-11-22 (×3): 237 mL via ORAL

## 2019-11-21 MED ORDER — DEXTROSE-NACL 5-0.9 % IV SOLN: INTRAVENOUS | Status: AC

## 2019-11-21 MED ORDER — TAMSULOSIN HCL 0.4 MG PO CAPS
0.4000 mg | ORAL_CAPSULE | Freq: Every day | ORAL | Status: DC
  Administered 2019-11-21 – 2019-11-24 (×4): 0.4 mg via ORAL
  Filled 2019-11-21 (×4): qty 1

## 2019-11-21 MED ORDER — POTASSIUM CHLORIDE CRYS ER 20 MEQ PO TBCR
40.0000 meq | EXTENDED_RELEASE_TABLET | Freq: Once | ORAL | Status: AC
  Administered 2019-11-21: 40 meq via ORAL
  Filled 2019-11-21: qty 2

## 2019-11-21 NOTE — Progress Notes (Signed)
PROGRESS NOTE  Lynn [name] ION:[id number] DOB: [birth year] DOA: 11/10/2019 PCP: Marin Olp, MD  Brief Narrative:  [phone] with a history of HTN, HLD, and depression/anxiety who presented with dysarthria/expressive aphasia and confusion.  She also felt that her left arm was weak.  She has no prior history of CVA.  In the ED she was confirmedto have left arm weakness and slurred speech.  MRI confirmed bilateral CVAs.  Neurology was consulted and [facility] admitted the patient.   Significant Events: 4/20 admit via [place] for CVA 4/21 TTE EF 60-65% 4/22 diet advanced to dysphagia 1 with nectar thick liquids  Antimicrobials:  None  HPI/Recap of past 24 hours:  Required in and out cath last night,  reported 400 cc urine drained after in and out cath She is very weak, denies pain, no hypoxia, poor oral intake + dysarthria  + hypophonia  Assessment/Plan: Principal Problem:   CVA (cerebral vascular accident) ([place]) Active Problems:   Hypertension   Hyperlipidemia   Depression   Hypokalemia   Leukocytosis   Acute ischemic bilateral CVAs - L>R BG and CR Presents with New left-sided weakness, facial droop, and expressive aphasia - initial CT suggested a possible left basal ganglia infarct and CTa was negative for large vessel occlusion or severe stenosis -subsequent MRI noted bilateral infarcts of the basal ganglia and corona radiata and mild small vessel disease - neurology directed her stroke evaluation - was taking aspirin 81 mg daily previously -acute strokes are felt to have been related to small vessel disease -  A1c not elevated at 5.1 -LDL elevated and treatment begun -  plan is to add Plavix to aspirin for 3 months then transition to Plavix only thereafter  -given bilateral involvement neurology has recommended a 30-day cardiac event monitor as an outpatient to rule out atrial fibrillation - (message sent to cards master) PT/OT are recommending CIR and the  patient is interested CIR recommend SNF level care for prolonged recovery, social worker consulted   Altered mental status 4/23 Suspect this was related to 1 dose of Norco that she was given -repeat acute CVA work-up was unrevealing -mental status dramatically improved  She is AAOx3 and oriented to situation this morning  Possible cognitive deficit Will need outpatient follow-up for possible cognitive decline/memory disorder -family reports this was present prior to this event  Dysphagia Consequence of CVA -cleared for D2 diet with nectar thick liquids by SLP initially and now upgraded to dysphagia 1 with nectar thick liquids - hopeful for further advancement of diet with ongoing speech therapy -speech improving  Acute urinary retention -Likely combination of neurogenic bladder, immobility, constipation -Treat reversible causes -Start Flomax, bladder scan every shift  Hyponatremia and hypokalemia Replaced, normalized Repeat lab in the morning  Leukocytosis; no fever.  Does not appear septic Blood culture no growth cxr: 1. Low lung volumes with bibasilar subsegmental atelectasis. 2. Aortic atherosclerosis repeat CBC in the morning, monitor  Abdominal distension, though soft, does not appear tender, Constipation, resolved Elevated lft, trending down kub/ab US unremarkable  Right foot ankle pain foot x ray no acute findings  ankle x-ray showed "Soft tissue swelling. Suspect small avulsion arising from the medial talus. Evidence of old injury with remodeling lateral malleolus. Ankle mortise appears intact. Small inferior calcaneal spur noted."  Case discussed with Ortho on-call Dr. Maxie Better who recommended apply postop shoe and outpatient follow-up with Dr. Doran Durand  DVT Prophylaxis: Lovenox subcu 40 mg  Code Status: Full  Family Communication:  Family meeting with patient's mother, sister and brother, case manager present during family meeting on 4/29  Disposition Plan:     Patient came from:                        Home                                                                                   Anticipated d/c place:  SNF  Barriers to d/c OR conditions which need to be met to effect a safe d/c:  Needs SNF placement, awaiting for bed/insurance approval   Consultants:  CIR  Neurology  Procedures:  Foley insertion April 25  Antibiotics:  None   Objective: BP (!) 141/74 (BP Location: Left Arm)   Pulse 72   Temp 98.1 F (36.7 C) (Oral)   Resp 20   Ht 5' 3"  (1.6 m)   Wt 58 kg   LMP 07/23/1998 (Approximate)   SpO2 92%   BMI 22.65 kg/m   Intake/Output Summary (Last 24 hours) at 11/21/2019 0835 Last data filed at 11/21/2019 6606 Gross per 24 hour  Intake --  Output 1127 ml  Net -1127 ml   Filed Weights   11/10/19 1159  Weight: 58 kg    Exam: Patient is examined daily including today on 11/21/2019, exams remain the same as of yesterday except that has changed    General: Very weak but NAD,   Cardiovascular: RRR  Respiratory: Diminished at bases, poor respiratory effort, no wheezing, no rales, no rhonchi  Abdomen: positive BS, appear distended, though soft, nontender,   Musculoskeletal: No Edema  Neuro: alert, oriented x3, able to move her right upper extremity better than left upper extremity, bilateral lower extremity not able to lift against gravity, bilateral foot drop right more than left, right dorsal foot tenderness  Data Reviewed: Basic Metabolic Panel: Recent Labs  Lab 11/16/19 0523 11/16/19 0523 11/17/19 0204 11/18/19 0343 11/19/19 0240 11/20/19 0332 11/21/19 0308  NA 137   < > 135 139 143 144 147*  K 2.9*   < > 3.8 4.4 4.2 3.4* 3.6  CL 106   < > 106 108 108 110 112*  CO2 23   < > 20* 12* 25 24 27   GLUCOSE 102*   < > 106* 123* 116* 118* 110*  BUN 18   < > 18 21 34* 38* 29*  CREATININE 0.61   < > 0.64 0.62 0.70 0.63 0.65  CALCIUM 8.4*   < > 8.8* 9.1 9.5 9.3 9.2  MG 1.9  --   --  2.0  --   --  2.2   PHOS  --   --   --   --   --   --  2.8   < > = values in this interval not displayed.   Liver Function Tests: Recent Labs  Lab 11/18/19 0343 11/19/19 0240 11/20/19 0332 11/21/19 0308  AST 55* 42* 30 26  ALT 90* 82* 66* 51*  ALKPHOS 86 88 80 77  BILITOT 0.8 0.8 0.6 0.5  PROT 6.0* 5.9* 5.7* 5.7*  ALBUMIN 2.3* 2.2* 2.3* 2.4*   No  results for input(s): LIPASE, AMYLASE in the last 168 hours. Recent Labs  Lab 11/19/19 0240  AMMONIA 19   CBC: Recent Labs  Lab 11/18/19 0343 11/19/19 0240 11/20/19 0332 11/21/19 0308  WBC 17.7* 15.3* 17.6* 12.9*  NEUTROABS 14.8* 12.4* 14.3* 10.2*  HGB 14.8 14.6 13.7 14.0  HCT 44.4 44.9 42.2 44.6  MCV 94.1 96.6 95.9 98.0  PLT 399 432* 452* 442*   Cardiac Enzymes:   Recent Labs  Lab 11/20/19 0332  CKTOTAL 35*   BNP (last 3 results) No results for input(s): BNP in the last 8760 hours.  ProBNP (last 3 results) No results for input(s): PROBNP in the last 8760 hours.  CBG: No results for input(s): GLUCAP in the last 168 hours.  Recent Results (from the past 240 hour(s))  Culture, blood (routine x 2)     Status: None   Collection Time: 11/14/19 12:57 AM   Specimen: BLOOD  Result Value Ref Range Status   Specimen Description BLOOD LEFT ANTECUBITAL  Final   Special Requests   Final    BOTTLES DRAWN AEROBIC AND ANAEROBIC Blood Culture results may not be optimal due to an excessive volume of blood received in culture bottles   Culture   Final    NO GROWTH 5 DAYS Performed at [facility], [place] [address], [place], [place] [address]    Report Status 11/19/2019 FINAL  Final  Culture, blood (routine x 2)     Status: None   Collection Time: 11/14/19  1:00 AM   Specimen: BLOOD  Result Value Ref Range Status   Specimen Description BLOOD LEFT ANTECUBITAL  Final   Special Requests   Final    BOTTLES DRAWN AEROBIC AND ANAEROBIC Blood Culture results may not be optimal due to an excessive volume of blood received in culture bottles    Culture   Final    NO GROWTH 5 DAYS Performed at [facility], [place] [address] [place], [place] [address]    Report Status 11/19/2019 FINAL  Final     Studies: No results found.  Scheduled Meds: . aspirin EC  81 mg Oral Daily  . atorvastatin  40 mg Oral Daily  . clopidogrel  75 mg Oral Daily  . enoxaparin (LOVENOX) injection  40 mg Subcutaneous Q24H  . feeding supplement (ENSURE ENLIVE)  237 mL Oral BID BM  . metoprolol tartrate  25 mg Oral BID  . polyethylene glycol  17 g Oral BID  . potassium chloride  40 mEq Oral Once  . senna-docusate  1 tablet Oral BID    Continuous Infusions: . dextrose 5 % and 0.9% NaCl       Time spent: 47mns I have personally reviewed and interpreted on  11/21/2019 daily labs, tele strips, imagings as discussed above under date review session and assessment and plans.  I reviewed all nursing notes, pharmacy notes, consultant notes,  vitals, pertinent old records  I have discussed plan of care as described above with RN , patient  on 11/21/2019   FFlorencia ReasonsMD, PhD, FACP  [facility]  Available via Epic secure chat 7am-7pm for nonurgent issues Please page for urgent issues, pager number available through [email or web address] .   11/21/2019, 8:35 AM  LOS: 11 days

## 2019-11-22 LAB — CBC WITH DIFFERENTIAL/PLATELET
Abs Immature Granulocytes: 0.22 10*3/uL — ABNORMAL HIGH (ref 0.00–0.07)
Basophils Absolute: 0.1 10*3/uL (ref 0.0–0.1)
Basophils Relative: 1 %
Eosinophils Absolute: 0.4 10*3/uL (ref 0.0–0.5)
Eosinophils Relative: 3 %
HCT: 40 % (ref 36.0–46.0)
Hemoglobin: 12.4 g/dL (ref 12.0–15.0)
Immature Granulocytes: 2 %
Lymphocytes Relative: 8 %
Lymphs Abs: 1 10*3/uL (ref 0.7–4.0)
MCH: 30.6 pg (ref 26.0–34.0)
MCHC: 31 g/dL (ref 30.0–36.0)
MCV: 98.8 fL (ref 80.0–100.0)
Monocytes Absolute: 1.1 10*3/uL — ABNORMAL HIGH (ref 0.1–1.0)
Monocytes Relative: 8 %
Neutro Abs: 11.1 10*3/uL — ABNORMAL HIGH (ref 1.7–7.7)
Neutrophils Relative %: 78 %
Platelets: 411 10*3/uL — ABNORMAL HIGH (ref 150–400)
RBC: 4.05 MIL/uL (ref 3.87–5.11)
RDW: 13.5 % (ref 11.5–15.5)
WBC: 13.9 10*3/uL — ABNORMAL HIGH (ref 4.0–10.5)
nRBC: 0 % (ref 0.0–0.2)

## 2019-11-22 LAB — COMPREHENSIVE METABOLIC PANEL
ALT: 37 U/L (ref 0–44)
AST: 22 U/L (ref 15–41)
Albumin: 2.1 g/dL — ABNORMAL LOW (ref 3.5–5.0)
Alkaline Phosphatase: 66 U/L (ref 38–126)
Anion gap: 7 (ref 5–15)
BUN: 25 mg/dL — ABNORMAL HIGH (ref 8–23)
CO2: 25 mmol/L (ref 22–32)
Calcium: 8.8 mg/dL — ABNORMAL LOW (ref 8.9–10.3)
Chloride: 112 mmol/L — ABNORMAL HIGH (ref 98–111)
Creatinine, Ser: 0.6 mg/dL (ref 0.44–1.00)
GFR calc Af Amer: >60 mL/min (ref >60)
GFR calc non Af Amer: >60 mL/min (ref >60)
Glucose, Bld: 113 mg/dL — ABNORMAL HIGH (ref 70–99)
Potassium: 3.2 mmol/L — ABNORMAL LOW (ref 3.5–5.1)
Sodium: 144 mmol/L (ref 135–145)
Total Bilirubin: 0.4 mg/dL (ref 0.3–1.2)
Total Protein: 5 g/dL — ABNORMAL LOW (ref 6.5–8.1)

## 2019-11-22 MED ORDER — POTASSIUM CHLORIDE CRYS ER 20 MEQ PO TBCR
40.0000 meq | EXTENDED_RELEASE_TABLET | Freq: Once | ORAL | Status: AC
  Administered 2019-11-22: 40 meq via ORAL
  Filled 2019-11-22: qty 2

## 2019-11-22 NOTE — Progress Notes (Signed)
PROGRESS NOTE  Lynn [name] YVO:[id number] DOB: [birth year] DOA: 11/10/2019 PCP: Marin Olp, MD  Brief Narrative:  [phone] with a history of HTN, HLD, and depression/anxiety who presented with dysarthria/expressive aphasia and confusion.  She also felt that her left arm was weak.  She has no prior history of CVA.  In the ED she was confirmedto have left arm weakness and slurred speech.  MRI confirmed bilateral CVAs.  Neurology was consulted and [facility] admitted the patient.   Significant Events: 4/20 admit via [place] for CVA 4/21 TTE EF 60-65% 4/22 diet advanced to dysphagia 1 with nectar thick liquids  Antimicrobials:  None  HPI/Recap of past 24 hours:  Foley placed last night due to recurrent urinary retention  She is very weak, denies pain, no hypoxia, poor oral intake + dysarthria  + hypophonia  Assessment/Plan: Principal Problem:   CVA (cerebral vascular accident) ([place]) Active Problems:   Hypertension   Hyperlipidemia   Depression   Hypokalemia   Leukocytosis   Malnutrition of moderate degree   Acute ischemic bilateral CVAs - L>R BG and CR -Presents with New left-sided weakness, facial droop, and expressive aphasia - - CTa was negative for large vessel occlusion or severe stenosis  -MRI noted bilateral infarcts of the basal ganglia and corona radiata and mild small vessel disease  - neurology directed her stroke evaluation - was taking aspirin 81 mg daily previously -acute strokes are felt to have been related to small vessel disease -  -A1c not elevated at 5.1 -LDL elevated and treatment begun -  -plan is to add Plavix to aspirin for 3 months then transition to Plavix only thereafter  -given bilateral involvement neurology has recommended a 30-day cardiac event monitor as an outpatient to rule out atrial fibrillation - (message sent to cards master) -needs SNF level care for prolonged recovery, social worker consulted, awaiting for snf bed    Altered mental status 4/23 Suspect this was related to 1 dose of Norco that she was given -repeat acute CVA work-up was unrevealing -mental status dramatically improved  She is AAOx3 and oriented to situation   Possible cognitive deficit Will need outpatient follow-up for possible cognitive decline/memory disorder -family reports this was present prior to this event  Dysphagia Consequence of CVA -cleared for D2 diet with nectar thick liquids by SLP initially and now upgraded to dysphagia 1 with nectar thick liquids - hopeful for further advancement of diet with ongoing speech therapy -speech improving  Acute urinary retention -Likely combination of neurogenic bladder, immobility, constipation -Treat reversible causes -Start Flomax -failed voiding trial, foley reinserted on 5/1, needs voiding trial once more mobile   Ab pain, possible due to urinary retention, resolved Constipation, resolved Elevated lft, unclear etiology , normalized kub/ab US unremarkable  Leukocytosis; no fever.  Does not appear septic Blood culture no growth cxr: 1. Low lung volumes with bibasilar subsegmental atelectasis. 2. Aortic atherosclerosis repeat CBC in the morning, monitor  Hyponatremia , normalized after gentle hydration  Hypokalemia, k 3.2 this am, replace and recheck in am   Right foot ankle pain foot x ray no acute findings  ankle x-ray showed "Soft tissue swelling. Suspect small avulsion arising from the medial talus. Evidence of old injury with remodeling lateral malleolus. Ankle mortise appears intact. Small inferior calcaneal spur noted."  Case discussed with Ortho on-call Dr. Maxie Better who recommended apply postop shoe and outpatient follow-up with Dr. Doran Durand  DVT Prophylaxis: Lovenox subcu 40 mg  Code Status: Full  Family Communication: Family meeting with patient's mother, sister and brother, case manager present during family meeting on 4/29  Disposition Plan:    Patient came  from:                        Home                                                                                   Anticipated d/c place:  SNF  Barriers to d/c OR conditions which need to be met to effect a safe d/c:  Needs SNF placement, awaiting for bed/insurance approval Repeat covid test for snf placement ordered, social worker and RN to coordinate time of collection  Consultants:  CIR  Neurology  Procedures:  Foley insertion /removal  In and out cath  Antibiotics:  None   Objective: BP (!) 150/80 (BP Location: Left Arm)   Pulse 79   Temp 97.6 F (36.4 C) (Oral)   Resp 16   Ht 5' 3"  (1.6 m)   Wt 58 kg   LMP 07/23/1998 (Approximate)   SpO2 93%   BMI 22.65 kg/m   Intake/Output Summary (Last 24 hours) at 11/22/2019 0831 Last data filed at 11/22/2019 0600 Gross per 24 hour  Intake 1003.98 ml  Output 900 ml  Net 103.98 ml   Filed Weights   11/10/19 1159  Weight: 58 kg    Exam: Patient is examined daily including today on 11/22/2019, exams remain the same as of yesterday except that has changed    General: Very weak but alert, interactive, NAD,   Cardiovascular: RRR  Respiratory: Diminished at bases, poor respiratory effort, no wheezing, no rales, no rhonchi  Abdomen: positive BS, appear distended, though soft, nontender,   Musculoskeletal: No Edema  Neuro: alert, oriented x3, + dysarthria, +hypophonia, able to move her right upper extremity better than left upper extremity, bilateral lower extremity not able to lift against gravity, bilateral foot drop right more than left, right dorsal foot tenderness  Data Reviewed: Basic Metabolic Panel: Recent Labs  Lab 11/16/19 0523 11/17/19 0204 11/18/19 0343 11/19/19 0240 11/20/19 0332 11/21/19 0308 11/22/19 0502  NA 137   < > 139 143 144 147* 144  K 2.9*   < > 4.4 4.2 3.4* 3.6 3.2*  CL 106   < > 108 108 110 112* 112*  CO2 23   < > 12* 25 24 27 25   GLUCOSE 102*   < > 123* 116* 118* 110* 113*  BUN 18    < > 21 34* 38* 29* 25*  CREATININE 0.61   < > 0.62 0.70 0.63 0.65 0.60  CALCIUM 8.4*   < > 9.1 9.5 9.3 9.2 8.8*  MG 1.9  --  2.0  --   --  2.2  --   PHOS  --   --   --   --   --  2.8  --    < > = values in this interval not displayed.   Liver Function Tests: Recent Labs  Lab 11/18/19 0343 11/19/19 0240 11/20/19 0332 11/21/19 0308 11/22/19 0502  AST 55* 42* 30 26 22   ALT 90*  82* 66* 51* 37  ALKPHOS 86 88 80 77 66  BILITOT 0.8 0.8 0.6 0.5 0.4  PROT 6.0* 5.9* 5.7* 5.7* 5.0*  ALBUMIN 2.3* 2.2* 2.3* 2.4* 2.1*   No results for input(s): LIPASE, AMYLASE in the last 168 hours. Recent Labs  Lab 11/19/19 0240  AMMONIA 19   CBC: Recent Labs  Lab 11/18/19 0343 11/19/19 0240 11/20/19 0332 11/21/19 0308 11/22/19 0502  WBC 17.7* 15.3* 17.6* 12.9* 13.9*  NEUTROABS 14.8* 12.4* 14.3* 10.2* 11.1*  HGB 14.8 14.6 13.7 14.0 12.4  HCT 44.4 44.9 42.2 44.6 40.0  MCV 94.1 96.6 95.9 98.0 98.8  PLT 399 432* 452* 442* 411*   Cardiac Enzymes:   Recent Labs  Lab 11/20/19 0332  CKTOTAL 35*   BNP (last 3 results) No results for input(s): BNP in the last 8760 hours.  ProBNP (last 3 results) No results for input(s): PROBNP in the last 8760 hours.  CBG: No results for input(s): GLUCAP in the last 168 hours.  Recent Results (from the past 240 hour(s))  Culture, blood (routine x 2)     Status: None   Collection Time: 11/14/19 12:57 AM   Specimen: BLOOD  Result Value Ref Range Status   Specimen Description BLOOD LEFT ANTECUBITAL  Final   Special Requests   Final    BOTTLES DRAWN AEROBIC AND ANAEROBIC Blood Culture results may not be optimal due to an excessive volume of blood received in culture bottles   Culture   Final    NO GROWTH 5 DAYS Performed at [facility], [place] [address] [place], [place] [address]    Report Status 11/19/2019 FINAL  Final  Culture, blood (routine x 2)     Status: None   Collection Time: 11/14/19  1:00 AM   Specimen: BLOOD  Result Value Ref Range  Status   Specimen Description BLOOD LEFT ANTECUBITAL  Final   Special Requests   Final    BOTTLES DRAWN AEROBIC AND ANAEROBIC Blood Culture results may not be optimal due to an excessive volume of blood received in culture bottles   Culture   Final    NO GROWTH 5 DAYS Performed at [facility], [place] [address], [place], [place] [address]    Report Status 11/19/2019 FINAL  Final     Studies: No results found.  Scheduled Meds: . aspirin EC  81 mg Oral Daily  . atorvastatin  40 mg Oral Daily  . clopidogrel  75 mg Oral Daily  . enoxaparin (LOVENOX) injection  40 mg Subcutaneous Q24H  . feeding supplement (ENSURE ENLIVE)  237 mL Oral BID BM  . metoprolol tartrate  25 mg Oral BID  . polyethylene glycol  17 g Oral BID  . potassium chloride  40 mEq Oral Once  . senna-docusate  1 tablet Oral BID  . tamsulosin  0.4 mg Oral Daily    Continuous Infusions: . dextrose 5 % and 0.9% NaCl 50 mL/hr at 11/22/19 7867     Time spent: 1mns I have personally reviewed and interpreted on  11/22/2019 daily labs, tele strips, imagings as discussed above under date review session and assessment and plans.  I reviewed all nursing notes, pharmacy notes, consultant notes,  vitals, pertinent old records  I have discussed plan of care as described above with RN , patient  on 11/22/2019   FFlorencia ReasonsMD, PhD, FACP  [facility]  Available via Epic secure chat 7am-7pm for nonurgent issues Please page for urgent issues, pager  number available through [email or web address] .   11/22/2019, 8:31 AM  LOS: 12 days

## 2019-11-23 ENCOUNTER — Other Ambulatory Visit: Payer: Self-pay | Admitting: Physician Assistant

## 2019-11-23 DIAGNOSIS — I639 Cerebral infarction, unspecified: Secondary | ICD-10-CM

## 2019-11-23 DIAGNOSIS — M25571 Pain in right ankle and joints of right foot: Secondary | ICD-10-CM

## 2019-11-23 LAB — CBC WITH DIFFERENTIAL/PLATELET
Abs Immature Granulocytes: 0.21 10*3/uL — ABNORMAL HIGH (ref 0.00–0.07)
Basophils Absolute: 0.1 10*3/uL (ref 0.0–0.1)
Basophils Relative: 1 %
Eosinophils Absolute: 0.4 10*3/uL (ref 0.0–0.5)
Eosinophils Relative: 3 %
HCT: 40.2 % (ref 36.0–46.0)
Hemoglobin: 13 g/dL (ref 12.0–15.0)
Immature Granulocytes: 2 %
Lymphocytes Relative: 8 %
Lymphs Abs: 1 10*3/uL (ref 0.7–4.0)
MCH: 31.3 pg (ref 26.0–34.0)
MCHC: 32.3 g/dL (ref 30.0–36.0)
MCV: 96.6 fL (ref 80.0–100.0)
Monocytes Absolute: 0.8 10*3/uL (ref 0.1–1.0)
Monocytes Relative: 6 %
Neutro Abs: 10.5 10*3/uL — ABNORMAL HIGH (ref 1.7–7.7)
Neutrophils Relative %: 80 %
Platelets: 431 10*3/uL — ABNORMAL HIGH (ref 150–400)
RBC: 4.16 MIL/uL (ref 3.87–5.11)
RDW: 13.4 % (ref 11.5–15.5)
WBC: 13 10*3/uL — ABNORMAL HIGH (ref 4.0–10.5)
nRBC: 0 % (ref 0.0–0.2)

## 2019-11-23 LAB — BASIC METABOLIC PANEL
Anion gap: 8 (ref 5–15)
BUN: 17 mg/dL (ref 8–23)
CO2: 23 mmol/L (ref 22–32)
Calcium: 8.8 mg/dL — ABNORMAL LOW (ref 8.9–10.3)
Chloride: 110 mmol/L (ref 98–111)
Creatinine, Ser: 0.56 mg/dL (ref 0.44–1.00)
GFR calc Af Amer: >60 mL/min (ref >60)
GFR calc non Af Amer: >60 mL/min (ref >60)
Glucose, Bld: 102 mg/dL — ABNORMAL HIGH (ref 70–99)
Potassium: 3.8 mmol/L (ref 3.5–5.1)
Sodium: 141 mmol/L (ref 135–145)

## 2019-11-23 LAB — MAGNESIUM: Magnesium: 1.8 mg/dL (ref 1.7–2.4)

## 2019-11-23 LAB — SARS CORONAVIRUS 2 (TAT 6-24 HRS): SARS Coronavirus 2: NEGATIVE

## 2019-11-23 MED ORDER — BETHANECHOL CHLORIDE 10 MG PO TABS
10.0000 mg | ORAL_TABLET | Freq: Three times a day (TID) | ORAL | Status: AC
  Administered 2019-11-23 – 2019-11-24 (×3): 10 mg via ORAL
  Filled 2019-11-23 (×3): qty 1

## 2019-11-23 NOTE — Progress Notes (Signed)
Lynn [name]  [id number] DOB: May 16, 1946 DOA: 11/10/2019 PCP: Marin Olp, MD    Brief Narrative:  [phone] with a history of HTN, HLD, and depression/anxiety who presented with dysarthria/expressive aphasia and confusion.  She also felt that her left arm was weak.  She has no prior history of CVA.  In the ED she was confirmedto have left arm weakness and slurred speech.  MRI confirmed bilateral CVAs.  Neurology was consulted and [facility] admitted the patient.  Significant Events: 4/20 admit via [facility] for CVA 4/21 TTE EF 60-65% 4/22 diet advanced to dysphagia 1 with nectar thick liquids  Antimicrobials:  None  Subjective: Continues to have difficulty with recurrent urinary retention. Foley had to be replaced. Resting comfortably. No sob, n/v, abdom pain, or cp.   Assessment & Plan:  Acute ischemic bilateral CVAs - L>R BG and CR New left-sided weakness, facial droop, and expressive aphasia -initial CT suggested a possible left basal ganglia infarct and CTa was negative for large vessel occlusion or severe stenosis -subsequent MRI noted bilateral infarcts of the basal ganglia and corona radiata and mild small vessel disease - neurology directed her stroke evaluation - was taking aspirin 81 mg daily previously -acute strokes are felt to have been related to small vessel disease - A1c not elevated at 5.1 -LDL elevated and treatment begun - plan is to add Plavix to aspirin for 3 months then transition to Plavix only thereafter -given bilateral involvement neurology has recommended a 30-day cardiac event monitor as an outpatient to rule out atrial fibrillation -CIR no longer the goal and patient awaiting SNF placement  Altered mental status 4/23 Suspect this was related to 1 dose of Norco that she was given -repeat acute CVA work-up was unrevealing -mental status dramatically improved and has stabilized over the last 48 hours  Dysphagia Consequence of CVA -cleared for  D2 diet with nectar thick liquids by SLP initially and now upgraded to dysphagia 1 with nectar thick liquids - hopeful for further advancement of diet with ongoing speech therapy -speech improving  Possible cognitive deficit Will need outpatient follow-up for possible cognitive decline/memory disorder -family reports this was present prior to this event  HTN BP controlled at this time   HLD LDL 115 - HDL 65 -Lipitor initiated -check LFTs in approximately 4-5 weeks  Hypokalemia Likely due to limited intake since admission -recurring -continue to supplement and follow -magnesium is normal  Hyponatremia Likely due to simple mild dehydration -corrected with volume resuscitation  Acute urinary retention Likely due to combination of neurogenic bladder, immobility, constipation -failed voiding trial with Foley reinserted 5/1 - trial of urecholine   Right foot ankle pain R ankle x-ray showed "soft tissue swelling - suspect small avulsion arising from the medial Talus - evidence of old injury with remodeling lateral malleolus - ankle mortise appears intact - small inferior calcaneal spur noted."  Case discussed with Ortho on-call Dr. Maxie Better by Dr. Erlinda Hong who recommended apply postop shoe and outpatient follow-up with Dr. Doran Durand  DVT prophylaxis: Lovenox Code Status: FULL CODE Family Communication:  Disposition Plan: plan is now for SNF placement instead of CIR - awaiting procurement of a SNF bed   Consultants:  none  Objective: Blood pressure (!) 148/90, pulse 74, temperature 97.9 F (36.6 C), temperature source Oral, resp. rate 19, height 5\' 3"  (1.6 m), weight 58 kg, last menstrual period 07/23/1998, SpO2 94 %.  Intake/Output Summary (Last 24 hours) at 11/23/2019 0931 Last data filed at 11/23/2019  0608 Gross per 24 hour  Intake --  Output 1275 ml  Net -1275 ml   Filed Weights   11/10/19 1159  Weight: 58 kg    Examination: General: No acute respiratory distress  -alert/interactive Lungs: CTA B  Cardiovascular: RRR  Abdomen: NT/ND, soft Extremities: No edema B LE   CBC: Recent Labs  Lab 11/21/19 0308 11/22/19 0502 11/23/19 0356  WBC 12.9* 13.9* 13.0*  NEUTROABS 10.2* 11.1* 10.5*  HGB 14.0 12.4 13.0  HCT 44.6 40.0 40.2  MCV 98.0 98.8 96.6  PLT 442* 411* [id number]*   Basic Metabolic Panel: Recent Labs  Lab 11/18/19 0343 11/19/19 0240 11/21/19 0308 11/22/19 0502 11/23/19 0356  NA 139   < > 147* 144 141  K 4.4   < > 3.6 3.2* 3.8  CL 108   < > 112* 112* 110  CO2 12*   < > 27 25 23   GLUCOSE 123*   < > 110* 113* 102*  BUN 21   < > 29* 25* 17  CREATININE 0.62   < > 0.65 0.60 0.56  CALCIUM 9.1   < > 9.2 8.8* 8.8*  MG 2.0  --  2.2  --  1.8  PHOS  --   --  2.8  --   --    < > = values in this interval not displayed.   GFR: Estimated Creatinine Clearance: 51 mL/min (by C-G formula based on SCr of 0.56 mg/dL).  Liver Function Tests: Recent Labs  Lab 11/19/19 0240 11/20/19 0332 11/21/19 0308 11/22/19 0502  AST 42* 30 26 22   ALT 82* 66* 51* 37  ALKPHOS 88 80 77 66  BILITOT 0.8 0.6 0.5 0.4  PROT 5.9* 5.7* 5.7* 5.0*  ALBUMIN 2.2* 2.3* 2.4* 2.1*    HbA1C: Hgb A1c MFr Bld  Date/Time Value Ref Range Status  11/11/2019 04:55 AM 5.1 4.8 - 5.6 % Final    Comment:    (NOTE) Pre diabetes:          5.7%-6.4% Diabetes:              >6.4% Glycemic control for   <7.0% adults with diabetes      Recent Results (from the past 240 hour(s))  Culture, blood (routine x 2)     Status: None   Collection Time: 11/14/19 12:57 AM   Specimen: BLOOD  Result Value Ref Range Status   Specimen Description BLOOD LEFT ANTECUBITAL  Final   Special Requests   Final    BOTTLES DRAWN AEROBIC AND ANAEROBIC Blood Culture results may not be optimal due to an excessive volume of blood received in culture bottles   Culture   Final    NO GROWTH 5 DAYS Performed at [facility], [place] [address], [place], [place] [address]    Report Status 11/19/2019  FINAL  Final  Culture, blood (routine x 2)     Status: None   Collection Time: 11/14/19  1:00 AM   Specimen: BLOOD  Result Value Ref Range Status   Specimen Description BLOOD LEFT ANTECUBITAL  Final   Special Requests   Final    BOTTLES DRAWN AEROBIC AND ANAEROBIC Blood Culture results may not be optimal due to an excessive volume of blood received in culture bottles   Culture   Final    NO GROWTH 5 DAYS Performed at [facility], [place] [address], [place], [place] [address]    Report Status 11/19/2019 FINAL  Final  Scheduled Meds: . aspirin EC  81 mg Oral Daily  . atorvastatin  40 mg Oral Daily  . clopidogrel  75 mg Oral Daily  . enoxaparin (LOVENOX) injection  40 mg Subcutaneous Q24H  . feeding supplement (ENSURE ENLIVE)  237 mL Oral BID BM  . metoprolol tartrate  25 mg Oral BID  . polyethylene glycol  17 g Oral BID  . senna-docusate  1 tablet Oral BID  . tamsulosin  0.4 mg Oral Daily       LOS: 13 days   Cherene Altes, MD [facility] Office  [phone] Pager - Text Page per Amion  If 7PM-7AM, please contact night-coverage per Amion 11/23/2019, 9:31 AM

## 2019-11-23 NOTE — Progress Notes (Addendum)
Physical Therapy Treatment Patient Details Name: Lynn [name] MRN: [id number] DOB: [birth year] Today's Date: 11/23/2019    History of Present Illness Pt is a 74 y/o female admitted secondary to L arm weakness and speech difficulty. MRI revealed Patchy acute infarcts involving the left greater than right basal ganglia and corona radiata. PMH includes HTN and depression.    PT Comments    Patient progressing slowly but working this session on sitting balance and upright awareness L side awareness and functional transfers.  She was able to assist with rolling in the bed with step by step instructional cues and increased time.  She also sat unsupported for brief time and initiate some weight bearing L UE with assistance.  She was denied CIR evidently so will need SNF level rehab upon d/c.  PT to follow acutely.    Follow Up Recommendations  SNF     Equipment Recommendations  Other (comment)(TBA next venue)    Recommendations for Other Services       Precautions / Restrictions Precautions Precautions: Fall Precaution Comments: L hemiparesis Required Braces or Orthoses: Other Brace Other Brace: post op shoe for R foot    Mobility  Bed Mobility Overal bed mobility: Needs Assistance Bed Mobility: Rolling;Sidelying to Sit Rolling: Max assist Sidelying to sit: Max assist       General bed mobility comments: step by step instructons to increase participation for rolling and side to sit with increased time given to follow cues  Transfers Overall transfer level: Needs assistance   Transfers: Lateral/Scoot Transfers;Squat Pivot Transfers     Squat pivot transfers: +2 physical assistance;Max assist     General transfer comment: A+2 for lateral scoot halfway to chair, then hip stuck on armrest and pad slid out from under her so assisted with squat pivot rest of the way to position in chair  Ambulation/Gait                 Stairs             Wheelchair  Mobility    Modified Rankin (Stroke Patients Only) Modified Rankin (Stroke Patients Only) Pre-Morbid Rankin Score: No symptoms Modified Rankin: Severe disability     Balance Overall balance assessment: Needs assistance Sitting-balance support: Feet supported;Single extremity supported;Bilateral upper extremity supported Sitting balance-Leahy Scale: Poor Sitting balance - Comments: sitting EOB working on balance with R UE supported and positioned L UE for support, able to sit unsupported about 5-10 seconds consistently falling back and to R, but if pulled up too much forward to R would loose her balance anteriorly; sat about 10 minutes at EOB                                    Cognition Arousal/Alertness: Awake/alert Behavior During Therapy: Flat affect(depressed) Overall Cognitive Status: Impaired/Different from baseline Area of Impairment: Attention;Following commands;Problem solving                   Current Attention Level: Sustained   Following Commands: Follows one step commands with increased time;Follows one step commands consistently     Problem Solving: Decreased initiation;Slow processing;Difficulty sequencing;Requires verbal cues;Requires tactile cues General Comments: L inattention, overall depressed affect; expressive aphasia      Exercises General Exercises - Lower Extremity Ankle Circles/Pumps: AAROM;10 reps;Supine;PROM    General Comments General comments (skin integrity, edema, etc.): VSS, brother present initially      Pertinent Vitals/Pain  Pain Assessment: Faces Faces Pain Scale: Hurts little more Pain Location: R foot with PROM Pain Descriptors / Indicators: Grimacing Pain Intervention(s): Monitored during session    Home Living                      Prior Function            PT Goals (current goals can now be found in the care plan section) Progress towards PT goals: Progressing toward goals    Frequency     Min 4X/week      PT Plan Discharge plan needs to be updated    Co-evaluation              AM-PAC PT "6 Clicks" Mobility   Outcome Measure  Help needed turning from your back to your side while in a flat bed without using bedrails?: Total Help needed moving from lying on your back to sitting on the side of a flat bed without using bedrails?: Total Help needed moving to and from a bed to a chair (including a wheelchair)?: Total Help needed standing up from a chair using your arms (e.g., wheelchair or bedside chair)?: Total Help needed to walk in hospital room?: Total Help needed climbing 3-5 steps with a railing? : Total 6 Click Score: 6    End of Session   Activity Tolerance: Patient limited by fatigue Patient left: in chair;with call bell/phone within reach;with chair alarm set Nurse Communication: Mobility status PT Visit Diagnosis: Unsteadiness on feet (R26.81);Hemiplegia and hemiparesis;Muscle weakness (generalized) (M62.81);Pain Hemiplegia - Right/Left: Left Hemiplegia - dominant/non-dominant: Non-dominant Hemiplegia - caused by: Cerebral infarction Pain - Right/Left: Right Pain - part of body: Ankle and joints of foot     Time: 1545-1610 PT Time Calculation (min) (ACUTE ONLY): 25 min  Charges:  $Therapeutic Activity: 23-37 mins                     Magda Kiel, [place] [facility] [phone] 11/23/2019]   Reginia Naas 11/23/2019, 5:01 PM

## 2019-11-24 MED ORDER — ATORVASTATIN CALCIUM 40 MG PO TABS: 40.0000 mg | ORAL_TABLET | Freq: Every day | ORAL | Status: DC

## 2019-11-24 MED ORDER — CLOPIDOGREL BISULFATE 75 MG PO TABS: 75.0000 mg | ORAL_TABLET | Freq: Every day | ORAL | Status: DC

## 2019-11-24 MED ORDER — BISACODYL 5 MG PO TBEC: 5.0000 mg | DELAYED_RELEASE_TABLET | Freq: Every day | ORAL | 0 refills | Status: DC | PRN

## 2019-11-24 MED ORDER — METOPROLOL TARTRATE 25 MG PO TABS: 25.0000 mg | ORAL_TABLET | Freq: Two times a day (BID) | ORAL | Status: DC

## 2019-11-24 MED ORDER — RESOURCE THICKENUP CLEAR PO POWD: ORAL | Status: DC

## 2019-11-24 NOTE — TOC Transition Note (Addendum)
Transition of Care [facility]) - CM/SW Discharge Note   Patient Details  Name: Lynn [name] MRN: [id number] Date of Birth: Nov 28, 1945  [facility] CM/SW Contact:  Angelita Ingles, RN Phone Number: 11/24/2019, 11:54 AM   Clinical Narrative:  Patient to be discharged at [facility]. Brother Clair Gulling made aware. Family has signed paperwork. Patient to be transported via [facility]. Transport has been set up. D/C packet placed in chart. No further needs noted at this time. CM will sign off.  Please call report Rober Minion [phone]  Rm # [phone]   Final next level of care: Skilled Nursing Facility([facility]) Barriers to Discharge: No Barriers Identified   Patient Goals and CMS Choice   CMS Medicare.gov Compare Post Acute Care list provided to:: Patient Represenative (must comment)(mother brother & sister) Choice offered to / list presented to : Patient, Sibling, Parent  Discharge Placement                Patient to be transferred to facility by: [facility] Name of family member notified: Don Perking Patient and family notified of of transfer: 11/24/19  Discharge Plan and Services In-house Referral: NA Discharge Planning Services: CM Consult Post Acute Care Choice: NA          DME Arranged: N/A DME Agency: NA       HH Arranged: NA HH Agency: NA        Social Determinants of Health (SDOH) Interventions     Readmission Risk Interventions No flowsheet data found.

## 2019-11-24 NOTE — Progress Notes (Signed)
Attempted to call report to [facility]. Called was disconnected after 4 minutes on hold.

## 2019-11-24 NOTE — Progress Notes (Signed)
Physical Therapy Treatment Patient Details Name: Lynn [name] MRN: [id number] DOB: 1946/01/06 Today's Date: 11/24/2019    History of Present Illness Pt is a 74 y/o female admitted secondary to L arm weakness and speech difficulty. MRI revealed Patchy acute infarcts involving the left greater than right basal ganglia and corona radiata. R ankle x-ray showed "soft tissue swelling - suspect small avulsion arising from the medialTalus - evidence of old injury with remodeling lateral malleolus -PMH includes HTN and depression.    PT Comments    Patient with limited progress due to pain initially in bed and limited focus as well as decreased initiation and poor activity tolerance.  She did well bringing hands together with OT assist with hand over hand initially and able to find PT on the R side tracking past midline.  She continues to be appropriate for SNF level rehab.  PT to continue to follow acutely.   Follow Up Recommendations  SNF     Equipment Recommendations  Other (comment)(TBA)    Recommendations for Other Services       Precautions / Restrictions Precautions Precautions: Fall Precaution Comments: L hemiparesis Required Braces or Orthoses: Other Brace Other Brace: post op shoe for R foot when up with therapy Restrictions Weight Bearing Restrictions: No    Mobility  Bed Mobility Overal bed mobility: Needs Assistance Bed Mobility: Rolling;Sidelying to Sit Rolling: Total assist;+2 for physical assistance Sidelying to sit: Total assist;+2 for physical assistance       General bed mobility comments: Cued to help with sequencing of rolling to left side, but did attempt to grab right upper rail with left hand once left hand brought close to bed rail  Transfers Overall transfer level: Needs assistance Equipment used: 2 person hand held assist   Sit to Stand: Total assist;+2 physical assistance(using belt and lift pad)         General transfer comment: Pt did  not A at all for sit<>stand; stedy used to get to recliner  Ambulation/Gait                 Stairs             Wheelchair Mobility    Modified Rankin (Stroke Patients Only) Modified Rankin (Stroke Patients Only) Pre-Morbid Rankin Score: No symptoms Modified Rankin: Severe disability     Balance Overall balance assessment: Needs assistance Sitting-balance support: Feet supported;Bilateral upper extremity supported Sitting balance-Leahy Scale: Poor Sitting balance - Comments: balances briefly on her own, then starts to fall without balance reactions   Standing balance support: Bilateral upper extremity supported Standing balance-Leahy Scale: Zero Standing balance comment: Dependent on therapists support to complete 2 sit to stands (from bed, from sara stedy)                            Cognition Arousal/Alertness: Awake/alert Behavior During Therapy: Flat affect Overall Cognitive Status: Impaired/Different from baseline Area of Impairment: Following commands;Problem solving;Attention                   Current Attention Level: Sustained   Following Commands: Follows one step commands inconsistently     Problem Solving: Slow processing;Decreased initiation;Difficulty sequencing;Requires verbal cues;Requires tactile cues General Comments: L inattention, overall depressed affect; expressive aphasia      Exercises      General Comments        Pertinent Vitals/Pain Pain Assessment: Faces Faces Pain Scale: Hurts worst Pain Location: "all over"  Pain Descriptors / Indicators: Grimacing;Moaning;Guarding Pain Intervention(s): Monitored during session;Repositioned;RN gave pain meds during session;Patient requesting pain meds-RN notified    Home Living                      Prior Function            PT Goals (current goals can now be found in the care plan section) Progress towards PT goals: Progressing toward goals     Frequency    Min 3X/week      PT Plan Current plan remains appropriate;Frequency needs to be updated    Co-evaluation PT/OT/SLP Co-Evaluation/Treatment: Yes Reason for Co-Treatment: For patient/therapist safety;To address functional/ADL transfers PT goals addressed during session: Mobility/safety with mobility;Balance;Proper use of DME OT goals addressed during session: ADL's and self-care;Strengthening/ROM      AM-PAC PT "6 Clicks" Mobility   Outcome Measure  Help needed turning from your back to your side while in a flat bed without using bedrails?: Total Help needed moving from lying on your back to sitting on the side of a flat bed without using bedrails?: Total Help needed moving to and from a bed to a chair (including a wheelchair)?: Total Help needed standing up from a chair using your arms (e.g., wheelchair or bedside chair)?: Total Help needed to walk in hospital room?: Total Help needed climbing 3-5 steps with a railing? : Total 6 Click Score: 6    End of Session Equipment Utilized During Treatment: Gait belt Activity Tolerance: Patient limited by fatigue Patient left: in chair;with call bell/phone within reach;with chair alarm set Nurse Communication: Mobility status PT Visit Diagnosis: Unsteadiness on feet (R26.81);Hemiplegia and hemiparesis;Muscle weakness (generalized) (M62.81);Pain Hemiplegia - Right/Left: Left Hemiplegia - dominant/non-dominant: Non-dominant Hemiplegia - caused by: Cerebral infarction Pain - Right/Left: Right Pain - part of body: Ankle and joints of foot     Time: [id number] PT Time Calculation (min) (ACUTE ONLY): 30 min  Charges:  $Therapeutic Activity: 8-22 mins                     Magda Kiel, [place] [facility] [phone] 11/24/2019    Lynn [name] 11/24/2019, 12:53 PM

## 2019-11-24 NOTE — Progress Notes (Signed)
Called report to [facility]. Report received from [facility].

## 2019-11-24 NOTE — Discharge Summary (Signed)
Physician Discharge Summary  Lynn [name] [id number] DOB: April 25, 1946 DOA: 11/10/2019  PCP: Marin Olp, MD  Admit date: 11/10/2019 Discharge date: 11/24/2019  Admitted From: Home Discharge disposition: Skilled nursing facility   Recommendations for Outpatient Follow-Up:   1. dys 1; nectar thick 2. Plavix plus aspirin for 3 months then transition to Plavix only thereafter 3. 30-day cardiac event monitor as an outpatient to rule out atrial fibrillation  4. Voiding trial for Foley removal 5. Will need outpatient follow-up with Dr. Doran Durand (orthopedics) 6. check LFTs in approximately 4-5 weeks 7. Right postop shoe when up with therapy   Discharge Diagnosis:   Principal Problem:   CVA (cerebral vascular accident) ([place]) Active Problems:   Hypertension   Hyperlipidemia   Depression   Hypokalemia   Leukocytosis   Malnutrition of moderate degree    Discharge Condition: Stable  Diet recommendation: see above  Wound care: None.  Code status: Full.   History of Present Illness:   74yo with a history of HTN, HLD, and depression/anxiety who presented with dysarthria/expressive aphasia and confusion.  She also felt that her left arm was weak.  She has no prior history of CVA.  In the ED she was confirmedto have left arm weakness and slurred speech.  MRI confirmed bilateral CVAs.  Neurology was consulted and [facility] admitted the patient.   Hospital Course by Problem:    Acute ischemic bilateral CVAs - L>R BG and CR New left-sided weakness, facial droop, and expressive aphasia -initial CT suggested a possible left basal ganglia infarct and CTa was negative for large vessel occlusion or severe stenosis -subsequent MRI noted bilateral infarcts of the basal ganglia and corona radiata and mild small vessel disease  - neurology directed her stroke evaluation - was taking aspirin 81 mg daily previously -acute strokes are felt to have been related to  small vessel disease - A1c not elevated at 5.1 -LDL elevated and treatment begun - plan is to add Plavix to aspirin for 3 months then transition to Plavix only thereafter -given bilateral involvement neurology has recommended a 30-day cardiac event monitor as an outpatient to rule out atrial fibrillation  Altered mental status 4/23 Suspected this was related to 1 dose of Norco that she was given -repeat acute CVA work-up was unrevealing  Dysphagia Consequence of CVA -cleared for D1 diet with nectar thick liquids by SLP  Possible cognitive deficit Will need outpatient follow-up for possible cognitive decline/memory disorder -family reports this was present prior to this event  HTN BP goal is normotensive  HLD LDL 115 - HDL 65 -Lipitor initiated -check LFTs in approximately 4-5 weeks  Hypokalemia -Repleted  Hyponatremia Likely due to simple mild dehydration  -corrected with volume resuscitation  Acute urinary retention Likely due to combination of neurogenic bladder, immobility, constipation  -failed voiding trial with Foley reinserted 5/1 - trial of urecholine -skilled nursing facility to do voiding trial  Right foot ankle pain R ankle x-ray showed "soft tissue swelling - suspect small avulsion arising from the medial Talus - evidence of old injury with remodeling lateral malleolus - ankle mortise appears intact - small inferior calcaneal spur noted." Case discussed with Ortho on-call Dr. Maxie Better by Dr. Erlinda Hong who recommended apply postop shoe and outpatient follow-up with Dr. Doran Durand    Medical Consultants:   Neurology Orth[facility] (phone)   Discharge Exam:   Vitals:   11/23/19 2226 11/24/19 0828  BP: 136/80 (!) 162/93  Pulse: 89 78  Resp:  20 16  Temp: 98.1 F (36.7 C) 97.8 F (36.6 C)  SpO2: 94% 94%   Vitals:   11/23/19 0823 11/23/19 1553 11/23/19 2226 11/24/19 0828  BP: (!) 148/90 129/72 136/80 (!) 162/93  Pulse: 74 76 89 78  Resp: 19 19 20 16   Temp: 97.9 F (36.6  C) 98.9 F (37.2 C) 98.1 F (36.7 C) 97.8 F (36.6 C)  TempSrc: Oral  Oral   SpO2: 94% 93% 94% 94%  Weight:      Height:        General exam: Appears calm and comfortable.  Dysarthria and hypophonia present  The results of significant diagnostics from this hospitalization (including imaging, microbiology, ancillary and laboratory) are listed below for reference.     Procedures and Diagnostic Studies:   CT Code Stroke CTA Head W/WO contrast  Result Date: 11/10/2019 CLINICAL DATA:  Generalized weakness.  Encephalopathy. EXAM: CT ANGIOGRAPHY HEAD AND NECK CT PERFUSION BRAIN TECHNIQUE: Multidetector CT imaging of the head and neck was performed using the standard protocol during bolus administration of intravenous contrast. Multiplanar CT image reconstructions and MIPs were obtained to evaluate the vascular anatomy. Carotid stenosis measurements (when applicable) are obtained utilizing NASCET criteria, using the distal internal carotid diameter as the denominator. Multiphase CT imaging of the brain was performed following IV bolus contrast injection. Subsequent parametric perfusion maps were calculated using RAPID software. CONTRAST:  40mL OMNIPAQUE IOHEXOL 350 MG/ML SOLN COMPARISON:  None. FINDINGS: CTA NECK FINDINGS Aortic arch: Standard 3 vessel aortic arch with minimal atherosclerotic plaque. Widely patent arch vessel origins. Right carotid system: Patent without evidence of stenosis, dissection, or significant atherosclerosis. Left carotid system: Patent without evidence of stenosis, dissection, or significant atherosclerosis. Vertebral arteries: Patent and codominant without evidence of stenosis, dissection, or significant atherosclerosis. Skeleton: Severe facet arthrosis at C3-4 and C4-5 with grade 1 anterolisthesis at both levels. Severe disc degeneration at C5-6 and C6-7. Severe multilevel neural foraminal stenosis in the cervical spine. Other neck: No evidence of cervical lymphadenopathy  or mass. Upper chest: No apical lung consolidation or mass. Review of the MIP images confirms the above findings CTA HEAD FINDINGS Anterior circulation: The internal carotid arteries are patent from skull base to carotid termini with minimal nonstenotic plaque bilaterally. ACAs and MCAs are patent without evidence of proximal branch occlusion or significant proximal stenosis. No aneurysm is identified. Posterior circulation: The intracranial vertebral arteries are widely patent to the basilar. Patent PICA, AICA, and SCA origins are identified bilaterally. Posterior communicating arteries are not clearly identified and may be diminutive or absent. The PCAs are patent without evidence of significant proximal stenosis. No aneurysm is identified. Venous sinuses: As permitted by contrast timing, patent. Anatomic variants: None. Review of the MIP images confirms the above findings CT Brain Perfusion Findings: CBF (<30%) Volume: 0 mL Perfusion (Tmax>6.0s) volume: 0 mL Mismatch Volume: n/a Infarction Location: None evident by CTP. IMPRESSION: 1. No large vessel occlusion or significant stenosis in the head and neck. 2. Negative CTP. 3. Advanced cervical disc and facet degeneration. 4.  Aortic Atherosclerosis (ICD10-I70.0). These results were communicated to Dr. Rory Percy at 10:53 am on 11/10/2019 by text page via the [facility] messaging system. Electronically Signed   By: Logan Bores M.D.   On: 11/10/2019 11:05   CT Code Stroke CTA Neck W/WO contrast  Result Date: 11/10/2019 CLINICAL DATA:  Generalized weakness.  Encephalopathy. EXAM: CT ANGIOGRAPHY HEAD AND NECK CT PERFUSION BRAIN TECHNIQUE: Multidetector CT imaging of the head and neck was performed using  the standard protocol during bolus administration of intravenous contrast. Multiplanar CT image reconstructions and MIPs were obtained to evaluate the vascular anatomy. Carotid stenosis measurements (when applicable) are obtained utilizing NASCET criteria, using the distal  internal carotid diameter as the denominator. Multiphase CT imaging of the brain was performed following IV bolus contrast injection. Subsequent parametric perfusion maps were calculated using RAPID software. CONTRAST:  38mL OMNIPAQUE IOHEXOL 350 MG/ML SOLN COMPARISON:  None. FINDINGS: CTA NECK FINDINGS Aortic arch: Standard 3 vessel aortic arch with minimal atherosclerotic plaque. Widely patent arch vessel origins. Right carotid system: Patent without evidence of stenosis, dissection, or significant atherosclerosis. Left carotid system: Patent without evidence of stenosis, dissection, or significant atherosclerosis. Vertebral arteries: Patent and codominant without evidence of stenosis, dissection, or significant atherosclerosis. Skeleton: Severe facet arthrosis at C3-4 and C4-5 with grade 1 anterolisthesis at both levels. Severe disc degeneration at C5-6 and C6-7. Severe multilevel neural foraminal stenosis in the cervical spine. Other neck: No evidence of cervical lymphadenopathy or mass. Upper chest: No apical lung consolidation or mass. Review of the MIP images confirms the above findings CTA HEAD FINDINGS Anterior circulation: The internal carotid arteries are patent from skull base to carotid termini with minimal nonstenotic plaque bilaterally. ACAs and MCAs are patent without evidence of proximal branch occlusion or significant proximal stenosis. No aneurysm is identified. Posterior circulation: The intracranial vertebral arteries are widely patent to the basilar. Patent PICA, AICA, and SCA origins are identified bilaterally. Posterior communicating arteries are not clearly identified and may be diminutive or absent. The PCAs are patent without evidence of significant proximal stenosis. No aneurysm is identified. Venous sinuses: As permitted by contrast timing, patent. Anatomic variants: None. Review of the MIP images confirms the above findings CT Brain Perfusion Findings: CBF (<30%) Volume: 0 mL Perfusion  (Tmax>6.0s) volume: 0 mL Mismatch Volume: n/a Infarction Location: None evident by CTP. IMPRESSION: 1. No large vessel occlusion or significant stenosis in the head and neck. 2. Negative CTP. 3. Advanced cervical disc and facet degeneration. 4.  Aortic Atherosclerosis (ICD10-I70.0). These results were communicated to Dr. Rory Percy at 10:53 am on 11/10/2019 by text page via the [facility] messaging system. Electronically Signed   By: Logan Bores M.D.   On: 11/10/2019 11:05   MR BRAIN WO CONTRAST  Result Date: 11/10/2019 CLINICAL DATA:  Generalized weakness. Encephalopathy. EXAM: MRI HEAD WITHOUT CONTRAST TECHNIQUE: Multiplanar, multiecho pulse sequences of the brain and surrounding structures were obtained without intravenous contrast. COMPARISON:  Head CT 11/10/2019 and MRI 02/28/2017 FINDINGS: Brain: There are patchy acute infarcts involving the basal ganglia/corona radiata bilaterally, left larger than right. A lacunar infarct in the posterior limb of the left internal capsule/lateral left thalamus is chronic but new from the prior MRI. T2 hyperintensities elsewhere in the cerebral white matter bilaterally have mildly progressed from the prior MRI and are nonspecific but compatible with mildly age advanced chronic small vessel ischemic disease. No intracranial hemorrhage, mass, midline shift, or extra-axial fluid collection is identified. Mild cerebral atrophy is within normal limits for age. Vascular: Major intracranial vascular flow voids are preserved. Skull and upper cervical spine: Unremarkable bone marrow signal. C3-4 facet arthrosis with grade 1 anterolisthesis. Sinuses/Orbits: Right cataract extraction. Paranasal sinuses and mastoid air cells are clear. Other: None. IMPRESSION: 1. Patchy acute infarcts involving the left greater than right basal ganglia and corona radiata. 2. Mild chronic small vessel ischemic disease. Electronically Signed   By: Logan Bores M.D.   On: 11/10/2019 11:28   CT Code Stroke  Cerebral Perfusion with contrast  Result Date: 11/10/2019 CLINICAL DATA:  Generalized weakness.  Encephalopathy. EXAM: CT ANGIOGRAPHY HEAD AND NECK CT PERFUSION BRAIN TECHNIQUE: Multidetector CT imaging of the head and neck was performed using the standard protocol during bolus administration of intravenous contrast. Multiplanar CT image reconstructions and MIPs were obtained to evaluate the vascular anatomy. Carotid stenosis measurements (when applicable) are obtained utilizing NASCET criteria, using the distal internal carotid diameter as the denominator. Multiphase CT imaging of the brain was performed following IV bolus contrast injection. Subsequent parametric perfusion maps were calculated using RAPID software. CONTRAST:  44mL OMNIPAQUE IOHEXOL 350 MG/ML SOLN COMPARISON:  None. FINDINGS: CTA NECK FINDINGS Aortic arch: Standard 3 vessel aortic arch with minimal atherosclerotic plaque. Widely patent arch vessel origins. Right carotid system: Patent without evidence of stenosis, dissection, or significant atherosclerosis. Left carotid system: Patent without evidence of stenosis, dissection, or significant atherosclerosis. Vertebral arteries: Patent and codominant without evidence of stenosis, dissection, or significant atherosclerosis. Skeleton: Severe facet arthrosis at C3-4 and C4-5 with grade 1 anterolisthesis at both levels. Severe disc degeneration at C5-6 and C6-7. Severe multilevel neural foraminal stenosis in the cervical spine. Other neck: No evidence of cervical lymphadenopathy or mass. Upper chest: No apical lung consolidation or mass. Review of the MIP images confirms the above findings CTA HEAD FINDINGS Anterior circulation: The internal carotid arteries are patent from skull base to carotid termini with minimal nonstenotic plaque bilaterally. ACAs and MCAs are patent without evidence of proximal branch occlusion or significant proximal stenosis. No aneurysm is identified. Posterior circulation:  The intracranial vertebral arteries are widely patent to the basilar. Patent PICA, AICA, and SCA origins are identified bilaterally. Posterior communicating arteries are not clearly identified and may be diminutive or absent. The PCAs are patent without evidence of significant proximal stenosis. No aneurysm is identified. Venous sinuses: As permitted by contrast timing, patent. Anatomic variants: None. Review of the MIP images confirms the above findings CT Brain Perfusion Findings: CBF (<30%) Volume: 0 mL Perfusion (Tmax>6.0s) volume: 0 mL Mismatch Volume: n/a Infarction Location: None evident by CTP. IMPRESSION: 1. No large vessel occlusion or significant stenosis in the head and neck. 2. Negative CTP. 3. Advanced cervical disc and facet degeneration. 4.  Aortic Atherosclerosis (ICD10-I70.0). These results were communicated to Dr. Rory Percy at 10:53 am on 11/10/2019 by text page via the [facility] messaging system. Electronically Signed   By: Logan Bores M.D.   On: 11/10/2019 11:05   DG Chest Portable 1 View  Result Date: 11/10/2019 CLINICAL DATA:  Altered mental status. EXAM: PORTABLE CHEST 1 VIEW COMPARISON:  12/25/2017 FINDINGS: The heart size and mediastinal contours are within normal limits. Both lungs are clear. The visualized skeletal structures are unremarkable. IMPRESSION: Normal exam. Electronically Signed   By: Lorriane Shire M.D.   On: 11/10/2019 13:22   ECHOCARDIOGRAM COMPLETE  Result Date: 11/11/2019    ECHOCARDIOGRAM REPORT   Patient Name:   DAWANA CORALES Date of Exam: 11/11/2019 Medical Rec #:  [id number]            Height:       63.0 in Accession #:    [id number]           Weight:       127.9 lb Date of Birth:  03-01-1946           BSA:          1.599 m Patient Age:    74 years  BP:           165/86 mmHg Patient Gender: F                    HR:           69 bpm. Exam Location:  Inpatient Procedure: 2D Echo, Cardiac Doppler, Color Doppler and 3D Echo Indications:    Stroke  History:         Patient has no prior history of Echocardiogram examinations.                 Stroke; Risk Factors:Hypertension and Dyslipidemia.  Sonographer:    Roseanna Rainbow RDCS Referring Phys: [place]  1. Left ventricular ejection fraction, by estimation, is 60 to 65%. The left ventricle has normal function. The left ventricle has no regional wall motion abnormalities. There is mild concentric left ventricular hypertrophy. Left ventricular diastolic parameters are consistent with Grade I diastolic dysfunction (impaired relaxation).  2. Right ventricular systolic function is normal. The right ventricular size is normal. Tricuspid regurgitation signal is inadequate for assessing PA pressure.  3. The mitral valve is grossly normal. No evidence of mitral valve regurgitation. No evidence of mitral stenosis.  4. The aortic valve is tricuspid. Aortic valve regurgitation is mild. No aortic stenosis is present.  5. The inferior vena cava is normal in size with greater than 50% respiratory variability, suggesting right atrial pressure of 3 mmHg. FINDINGS  Left Ventricle: Left ventricular ejection fraction, by estimation, is 60 to 65%. The left ventricle has normal function. The left ventricle has no regional wall motion abnormalities. The left ventricular internal cavity size was normal in size. There is  mild concentric left ventricular hypertrophy. Left ventricular diastolic parameters are consistent with Grade I diastolic dysfunction (impaired relaxation). Normal left ventricular filling pressure. Right Ventricle: The right ventricular size is normal. No increase in right ventricular wall thickness. Right ventricular systolic function is normal. Tricuspid regurgitation signal is inadequate for assessing PA pressure. Left Atrium: Left atrial size was normal in size. Right Atrium: Right atrial size was normal in size. Pericardium: Trivial pericardial effusion is present. Presence of pericardial fat pad. Mitral  Valve: The mitral valve is grossly normal. No evidence of mitral valve regurgitation. No evidence of mitral valve stenosis. Tricuspid Valve: The tricuspid valve is grossly normal. Tricuspid valve regurgitation is not demonstrated. No evidence of tricuspid stenosis. Aortic Valve: The aortic valve is tricuspid. Aortic valve regurgitation is mild. Aortic regurgitation PHT measures 411 msec. No aortic stenosis is present. Aortic valve mean gradient measures 4.0 mmHg. Aortic valve peak gradient measures 7.3 mmHg. Aortic  valve area, by VTI measures 1.66 cm. Pulmonic Valve: The pulmonic valve was grossly normal. Pulmonic valve regurgitation is not visualized. No evidence of pulmonic stenosis. Aorta: The aortic root and ascending aorta are structurally normal, with no evidence of dilitation. Venous: The inferior vena cava is normal in size with greater than 50% respiratory variability, suggesting right atrial pressure of 3 mmHg. IAS/Shunts: The atrial septum is grossly normal.  LEFT VENTRICLE PLAX 2D LVIDd:         3.20 cm     Diastology LVIDs:         2.10 cm     LV e' lateral:   5.98 cm/s LV PW:         1.20 cm     LV E/e' lateral: 6.2 LV IVS:        1.30 cm  LV e' medial:    4.41 cm/s LVOT diam:     1.70 cm     LV E/e' medial:  8.4 LV SV:         41 LV SV Index:   26 LVOT Area:     2.27 cm  LV Volumes (MOD) LV vol d, MOD A2C: 57.3 ml LV vol d, MOD A4C: 59.1 ml LV vol s, MOD A2C: 16.1 ml LV vol s, MOD A4C: 22.1 ml LV SV MOD A2C:     41.2 ml LV SV MOD A4C:     59.1 ml LV SV MOD BP:      43.9 ml RIGHT VENTRICLE             IVC RV S prime:     13.60 cm/s  IVC diam: 1.00 cm TAPSE (M-mode): 2.1 cm LEFT ATRIUM             Index       RIGHT ATRIUM          Index LA diam:        2.70 cm 1.69 cm/m  RA Area:     9.88 cm LA Vol (A2C):   16.5 ml 10.32 ml/m RA Volume:   20.50 ml 12.82 ml/m LA Vol (A4C):   16.3 ml 10.19 ml/m LA Biplane Vol: 18.0 ml 11.26 ml/m  AORTIC VALVE AV Area (Vmax):    1.48 cm AV Area (Vmean):   1.55  cm AV Area (VTI):     1.66 cm AV Vmax:           135.00 cm/s AV Vmean:          88.700 cm/s AV VTI:            0.246 m AV Peak Grad:      7.3 mmHg AV Mean Grad:      4.0 mmHg LVOT Vmax:         88.30 cm/s LVOT Vmean:        60.500 cm/s LVOT VTI:          0.180 m LVOT/AV VTI ratio: 0.73 AI PHT:            411 msec  AORTA Ao Root diam: 2.60 cm Ao Asc diam:  3.90 cm MITRAL VALVE MV Area (PHT): 3.27 cm    SHUNTS MV Decel Time: 232 msec    Systemic VTI:  0.18 m MV E velocity: 37.20 cm/s  Systemic Diam: 1.70 cm MV A velocity: 79.40 cm/s MV E/A ratio:  0.47 Eleonore Chiquito MD Electronically signed by Eleonore Chiquito MD Signature Date/Time: 11/11/2019/5:18:18 PM    Final    CT HEAD CODE STROKE WO CONTRAST  Result Date: 11/10/2019 CLINICAL DATA:  Code stroke.  Generalized weakness. EXAM: CT HEAD WITHOUT CONTRAST TECHNIQUE: Contiguous axial images were obtained from the base of the skull through the vertex without intravenous contrast. COMPARISON:  Head MRI 02/28/2017 FINDINGS: Brain: There is no evidence of acute large territory infarct, intracranial hemorrhage, mass, midline shift, or extra-axial fluid collection. Mild cerebral atrophy is within normal limits for age. Patchy hypodensities in the cerebral white matter bilaterally are nonspecific but compatible with mild to moderate chronic small vessel ischemic disease. Asymmetric, more focal hypodensities are present in the left basal ganglia, left internal capsule, and left corona radiata, new from the prior MRI and compatible with age indeterminate lacunar infarcts. Vascular: Calcified atherosclerosis at the skull base. No hyperdense vessel. Skull: No fracture or suspicious osseous lesion. Sinuses/Orbits:  Visualized paranasal sinuses and mastoid air cells are clear. Right cataract extraction is noted. Other: None. ASPECTS [facility] Stroke Program Early CT Score) Not scored due to the lack of localizing symptoms. IMPRESSION: 1. Age indeterminate though possibly recent  left basal ganglia region infarcts. 2. No evidence of acute cortically based infarct or intracranial hemorrhage. 3. Mild-to-moderate chronic small vessel ischemic disease. These findings were discussed via telephone with Dr. Rory Percy at 10:18 am on 11/10/2019. Electronically Signed   By: Logan Bores M.D.   On: 11/10/2019 10:22     Labs:   Basic Metabolic Panel: Recent Labs  Lab 11/18/19 0343 11/18/19 0343 11/19/19 0240 11/19/19 0240 11/20/19 0332 11/20/19 0332 11/21/19 0308 11/21/19 0308 11/22/19 0502 11/23/19 0356  NA 139   < > 143  --  144  --  147*  --  144 141  K 4.4   < > 4.2   < > 3.4*   < > 3.6   < > 3.2* 3.8  CL 108   < > 108  --  110  --  112*  --  112* 110  CO2 12*   < > 25  --  24  --  27  --  25 23  GLUCOSE 123*   < > 116*  --  118*  --  110*  --  113* 102*  BUN 21   < > 34*  --  38*  --  29*  --  25* 17  CREATININE 0.62   < > 0.70  --  0.63  --  0.65  --  0.60 0.56  CALCIUM 9.1   < > 9.5  --  9.3  --  9.2  --  8.8* 8.8*  MG 2.0  --   --   --   --   --  2.2  --   --  1.8  PHOS  --   --   --   --   --   --  2.8  --   --   --    < > = values in this interval not displayed.   GFR Estimated Creatinine Clearance: 51 mL/min (by C-G formula based on SCr of 0.56 mg/dL). Liver Function Tests: Recent Labs  Lab 11/18/19 0343 11/19/19 0240 11/20/19 0332 11/21/19 0308 11/22/19 0502  AST 55* 42* 30 26 22   ALT 90* 82* 66* 51* 37  ALKPHOS 86 88 80 77 66  BILITOT 0.8 0.8 0.6 0.5 0.4  PROT 6.0* 5.9* 5.7* 5.7* 5.0*  ALBUMIN 2.3* 2.2* 2.3* 2.4* 2.1*   No results for input(s): LIPASE, AMYLASE in the last 168 hours. Recent Labs  Lab 11/19/19 0240  AMMONIA 19   Coagulation profile No results for input(s): INR, PROTIME in the last 168 hours.  CBC: Recent Labs  Lab 11/19/19 0240 11/20/19 0332 11/21/19 0308 11/22/19 0502 11/23/19 0356  WBC 15.3* 17.6* 12.9* 13.9* 13.0*  NEUTROABS 12.4* 14.3* 10.2* 11.1* 10.5*  HGB 14.6 13.7 14.0 12.4 13.0  HCT 44.9 42.2 44.6 40.0  40.2  MCV 96.6 95.9 98.0 98.8 96.6  PLT 432* 452* 442* 411* 431*   Cardiac Enzymes: Recent Labs  Lab 11/20/19 0332  CKTOTAL 35*   BNP: Invalid input(s): POCBNP CBG: No results for input(s): GLUCAP in the last 168 hours. D-Dimer No results for input(s): DDIMER in the last 72 hours. Hgb A1c No results for input(s): HGBA1C in the last 72 hours. Lipid Profile No results for input(s): CHOL, HDL, LDLCALC, TRIG, CHOLHDL, LDLDIRECT  in the last 72 hours. Thyroid function studies No results for input(s): TSH, T4TOTAL, T3FREE, THYROIDAB in the last 72 hours.  Invalid input(s): FREET3 Anemia work up No results for input(s): VITAMINB12, FOLATE, FERRITIN, TIBC, IRON, RETICCTPCT in the last 72 hours. Microbiology Recent Results (from the past 240 hour(s))  SARS CORONAVIRUS 2 (TAT 6-24 HRS)     Status: None   Collection Time: 11/23/19 12:55 PM  Result Value Ref Range Status   SARS Coronavirus 2 NEGATIVE NEGATIVE Final    Comment: (NOTE) SARS-CoV-2 target nucleic acids are NOT DETECTED. The SARS-CoV-2 RNA is generally detectable in upper and lower respiratory specimens during the acute phase of infection. Negative results do not preclude SARS-CoV-2 infection, do not rule out co-infections with other pathogens, and should not be used as the sole basis for treatment or other patient management decisions. Negative results must be combined with clinical observations, patient history, and epidemiological information. The expected result is Negative. Fact Sheet for Patients: SugarRoll.be Fact Sheet for Healthcare Providers: https://www.woods-mathews.com/ This test is not yet approved or cleared by the [place] FDA and  has been authorized for detection and/or diagnosis of SARS-CoV-2 by FDA under an Emergency Use Authorization (EUA). This EUA will remain  in effect (meaning this test can be used) for the duration of the COVID-19 declaration under  Section 56 4(b)(1) of the Act, 21 U.S.C. section 360bbb-3(b)(1), unless the authorization is terminated or revoked sooner. Performed at [facility], [place] [address], [place], [place] [address]      Discharge Instructions:   Discharge Instructions    Increase activity slowly   Complete by: As directed      Allergies as of 11/24/2019      Reactions   Chlorhexidine       Medication List    STOP taking these medications   diclofenac 75 MG EC tablet Commonly known as: VOLTAREN     TAKE these medications   aspirin 81 MG tablet Take 81 mg by mouth daily.   atorvastatin 40 MG tablet Commonly known as: LIPITOR Take 1 tablet (40 mg total) by mouth daily.   bisacodyl 5 MG EC tablet Commonly known as: DULCOLAX Take 1 tablet (5 mg total) by mouth daily as needed for moderate constipation.   clopidogrel 75 MG tablet Commonly known as: PLAVIX Take 1 tablet (75 mg total) by mouth daily.   metoprolol tartrate 25 MG tablet Commonly known as: LOPRESSOR Take 1 tablet (25 mg total) by mouth 2 (two) times daily.   Resource ThickenUp Clear Powd As needed       Contact information for follow-up providers    Cameron Sprang, MD. Go on 12/07/2019.   Specialty: Neurology Contact information: [address] [place] [address] [phone]        Wylene Simmer, MD Follow up in 3 week(s).   Specialty: Orthopedic Surgery Why: right ankle pain Contact information: [address] [place] [address] [id number]            Contact information for after-discharge care    Destination    [facility] Preferred SNF .   Service: Skilled Nursing Contact information: [address] [place] [phone]                   Time coordinating discharge: 35 min  Signed:  Geradine Girt DO  [facility] 11/24/2019, 10:08 AM

## 2019-11-24 NOTE — Progress Notes (Addendum)
Occupational Therapy Treatment Patient Details Name: Lynn [name] MRN: [id number] DOB: 01-19-1946 Today's Date: 11/24/2019    History of present illness Pt is a 74 y/o female admitted secondary to L arm weakness and speech difficulty. MRI revealed Patchy acute infarcts involving the left greater than right basal ganglia and corona radiata. R ankle x-ray showed "soft tissue swelling - suspect small avulsion arising from the medialTalus - evidence of old injury with remodeling lateral malleolus -PMH includes HTN and depression.   OT comments  This 74 yo female admitted with above seen today in conjunction with PT to work with patient on sit<>stand with sara stedy. Pt initially c/o a lot of pain but once up to EOB no further c/o pain until asked at end of session (RN gave pain meds during session). Pt with flat affect but we did get a couple of smiles from her at end of session. Pt needed total A +2 for bed mobility and transfers today (minimal participation with bed mobility and non with sit to stand (but did A with sitting balance). She will continue to benefit from acute OT with follow up at SNF>   Follow Up Recommendations  SNF;Supervision/Assistance - 24 hour    Equipment Recommendations  Other (comment)(TBD next venue)       Precautions / Restrictions Precautions Precautions: Fall Precaution Comments: L hemiparesis Required Braces or Orthoses: Other Brace Other Brace: post op shoe for R foot when up with therapy Restrictions Weight Bearing Restrictions: No       Mobility Bed Mobility Overal bed mobility: Needs Assistance Bed Mobility: Rolling;Sidelying to Sit Rolling: Total assist;+2 for physical assistance Sidelying to sit: Total assist;+2 for physical assistance       General bed mobility comments: Cued to help with sequencing of rolling to left side, but did attempt to grab right upper rail with left hand once left hand brought close to bed  rail  Transfers Overall transfer level: Needs assistance     Sit to Stand: Total assist;+2 physical assistance(use of belt and bed pad)         General transfer comment: Pt did not A at all for sit<>stand    Balance Overall balance assessment: Needs assistance Sitting-balance support: Feet supported;Bilateral upper extremity supported   Sitting balance - Comments: poor to zero with left lateral lean   Standing balance support: Bilateral upper extremity supported Standing balance-Leahy Scale: Zero Standing balance comment: Dependent on therapists support to complete 2 sit to stands (from bed, from sara stedy)                           ADL either performed or assessed with clinical judgement   ADL Overall ADL's : Needs assistance/impaired Eating/Feeding: Total assistance Eating/Feeding Details (indicate cue type and reason): for sips of nectat thick with spoon Grooming: Wash/dry face;Maximal assistance Grooming Details (indicate cue type and reason): washed eyes and mouth, would not attempt to wash other parts of face. Max A to wash hands together with wash cloth.                  Toilet Transfer: Total assistance;+2 for physical assistance;+2 for safety/equipment Toilet Transfer Details (indicate cue type and reason): use of sara stedy for bed>recliner transfer            Had pt look at left hand and helped her put both hands together and work on holding left hand with right hand and bend/straighten elbows  at lap level.     Vision   Additional Comments: Pt with right head turn and gaze preference but can turn head and eyes ~1/2 way past midline          Cognition Arousal/Alertness: Awake/alert Behavior During Therapy: Flat affect Overall Cognitive Status: Impaired/Different from baseline Area of Impairment: Following commands;Problem solving;Attention                   Current Attention Level: Sustained   Following Commands: Follows one  step commands inconsistently     Problem Solving: Slow processing;Decreased initiation;Difficulty sequencing;Requires verbal cues;Requires tactile cues General Comments: L inattention, overall depressed affect; expressive aphasia                   Pertinent Vitals/ Pain       Pain Assessment: Faces Faces Pain Scale: Hurts worst Pain Location: "all over" Pain Descriptors / Indicators: Grimacing;Moaning(until we got to EOB, then no signs of c/o pain again until after in the recliner and she was asked) Pain Intervention(s): Monitored during session;Repositioned;Patient requesting pain meds-RN notified;RN gave pain meds during session         Frequency  Min 2X/week        Progress Toward Goals  OT Goals(current goals can now be found in the care plan section)  Progress towards OT goals: Progressing toward goals     Plan Discharge plan remains appropriate;Frequency needs to be updated    Co-evaluation    PT/OT/SLP Co-Evaluation/Treatment: Yes Reason for Co-Treatment: For patient/therapist safety;To address functional/ADL transfers PT goals addressed during session: Mobility/safety with mobility;Strengthening/ROM OT goals addressed during session: ADL's and self-care;Strengthening/ROM      AM-PAC OT "6 Clicks" Daily Activity     Outcome Measure   Help from another person eating meals?: Total Help from another person taking care of personal grooming?: A Lot Help from another person toileting, which includes using toliet, bedpan, or urinal?: Total Help from another person bathing (including washing, rinsing, drying)?: Total Help from another person to put on and taking off regular upper body clothing?: Total Help from another person to put on and taking off regular lower body clothing?: Total 6 Click Score: 7    End of Session Equipment Utilized During Treatment: Gait belt  OT Visit Diagnosis: Muscle weakness (generalized) (M62.81);Hemiplegia and  hemiparesis;Pain;Other symptoms and signs involving cognitive function Hemiplegia - Right/Left: Left Hemiplegia - dominant/non-dominant: Non-Dominant Hemiplegia - caused by: Cerebral infarction Pain - Right/Left: (all over)   Activity Tolerance Patient limited by pain   Patient Left in chair;with call bell/phone within reach;with chair alarm set   Nurse Communication Mobility status(RN helped [place] in getting her up and NTs made aware to get her back with lateral slide from drop arm recliner to bed due to sara stedy is from 3w (2 w does not have one))        Time: [id number] OT Time Calculation (min): 30 min  Charges: OT General Charges $OT Visit: 1 Visit OT Treatments $Self Care/Home Management : 8-22 mins  [address] [address], OTR/L Acute Rehab Services Pager [phone] Office [phone]     Almon Register 11/24/2019, 11:35 AM

## 2019-11-25 ENCOUNTER — Encounter: Payer: Self-pay | Admitting: Internal Medicine

## 2019-11-25 ENCOUNTER — Encounter: Payer: Self-pay | Admitting: *Deleted

## 2019-11-25 ENCOUNTER — Non-Acute Institutional Stay (SKILLED_NURSING_FACILITY): Payer: Medicare Other | Admitting: Internal Medicine

## 2019-11-25 ENCOUNTER — Telehealth: Payer: Self-pay | Admitting: *Deleted

## 2019-11-25 DIAGNOSIS — R339 Retention of urine, unspecified: Secondary | ICD-10-CM

## 2019-11-25 DIAGNOSIS — I63313 Cerebral infarction due to thrombosis of bilateral middle cerebral arteries: Secondary | ICD-10-CM | POA: Diagnosis not present

## 2019-11-25 DIAGNOSIS — I10 Essential (primary) hypertension: Secondary | ICD-10-CM

## 2019-11-25 DIAGNOSIS — E876 Hypokalemia: Secondary | ICD-10-CM

## 2019-11-25 DIAGNOSIS — E7800 Pure hypercholesterolemia, unspecified: Secondary | ICD-10-CM

## 2019-11-25 LAB — COMPREHENSIVE METABOLIC PANEL
Calcium: 9.4 (ref 8.7–10.7)
GFR calc non Af Amer: 90

## 2019-11-25 LAB — BASIC METABOLIC PANEL
BUN: 32 — AB (ref 4–21)
CO2: 22 (ref 13–22)
Chloride: 107 (ref 99–108)
Creatinine: 0.5 (ref 0.5–1.1)
Glucose: 87
Potassium: 4 (ref 3.4–5.3)
Sodium: 145 (ref 137–147)

## 2019-11-25 LAB — CBC AND DIFFERENTIAL
HCT: 41 (ref 36–46)
Hemoglobin: 13.3 (ref 12.0–16.0)
Platelets: 455 — AB (ref 150–399)
WBC: 13.3

## 2019-11-25 NOTE — Telephone Encounter (Signed)
Patient enrolled for [employer] to ship a 30 day Cardiac event monitor to [facility] and [employer], [address], [place], [place], [place] [address]

## 2019-11-25 NOTE — Progress Notes (Signed)
Location:    [place] Room Number: [id number] Place of Service:  SNF [phone]) Provider:  Holly Bodily, MD  Patient Care Team: Marin Olp, MD as PCP - [facility])  Extended Emergency Contact Information Primary Emergency Contact: [facility] Address: [address]          [address]          [place], [place] [address] [place] Phone: [phone] Mobile Phone: [phone] Relation: Mother Secondary Emergency Contact: kella, gloeckner Mobile Phone: [phone] Relation: Brother  Code Status:  Full Code Goals of care: Advanced Directive information Advanced Directives 11/25/2019  Does Patient Have a Medical Advance Directive? Yes  Type of Advance Directive (No Data)  Does patient want to make changes to medical advance directive? No - Patient declined  Would patient like information on creating a medical advance directive? -     Chief Complaint  Patient presents with  . Hospitalization Follow-up    Hospitalization Follow Up  Acute visit status post hospitalization for CVA  HPI:  Pt is a 74 y.o. female seen today for a hospital f/u after hospitalization for what was a diagnosis of CVA.  Patient has a history of hypertension hyperlipidemia depression and anxiety and she presented with dysarthria and expressive aphasia and confusion.  Was also thought she had left arm weakness.  MRI confirmed bilateral CVAs.  Per MRI these were bilateral infarcts of the basal ganglia and corona radiate and mild small vessel disease.  Neurology was consulted and it was thought the strokes most likely related to small vessel disease.  Her hemoglobin A1c was 5.1 LDL was elevated and treatment was begun with a statin.  Plavix was also added to aspirin recommendation for 3 months of dual therapy and then transition to Plavix after lab.  Also given bilateral involvement neurology did recommend a  30-day cardiac event monitor as outpatient to rule out atrial fibrillation.  Her altered mental status was thought related to 1 dose of Norco that she was given-repeat CVA work-up was unrevealing.  In regards to dysphagia and secondary CVA she was cleared for dysphagia 1 diet with nectar thick liquids by speech.  She does have possible cognitive decline apparently this was even prior however to her CVA suggestion for possible outpatient follow-up.  She also had apparently hypokalemia which was repleted-as well as hyponatremia that was thought due to simple mild dehydration and corrected with volume resuscitation.  She does have a Foley catheter in place secondary to acute urinary retention thought multifactorial with neurogenic bladder immobility and constipation-recommendation for a voiding trial in the facility.  She also had right foot and ankle pain-x-ray showed soft tissue swelling there was a suspected small avulsion arising from the medial talus-also a calcaneal spur was noted-this was discussed with orthopedic on call who recommended postop shoe and outpatient follow-up with Dr. Doran Durand.  Currently she is lying in bed comfortably vital signs appear to be stable-- she is very soft-spoken and does not speak much but when asked does not complain of pain or discomfort at this point  Past Medical History:  Diagnosis Date  . Anxiety   . Aortic atherosclerosis ([place])   . Arthritis   . Complication of anesthesia    per pt, slow to wake up past sedation.  . CVA (cerebral vascular accident) ([place]) 11/10/2019  . Depression   . Hyperplastic colon polyp   . Hypertension   .  Neuropathy   . Osteoarthritis   . Osteoporosis   . Stomach upset    has a senstive stomach  . Throat clearing    has a cough at times  . Urinary incontinence   . Vitamin D deficiency    Past Surgical History:  Procedure Laterality Date  . COLONOSCOPY    . FACIAL COSMETIC SURGERY     over 15 years ago  . FINGER  TENDON REPAIR     middle rt hand-  . HAMMER TOE SURGERY  12/27/2011   Procedure: HAMMER TOE CORRECTION;  Surgeon: Wylene Simmer, MD;  Location: [place];  Service: Orthopedics;  Laterality: Left;  2-4th    Allergies  Allergen Reactions  . Chlorhexidine     Allergies as of 11/25/2019      Reactions   Chlorhexidine       Medication List       Accurate as of Nov 25, 2019 11:19 AM. If you have any questions, ask your nurse or doctor.        STOP taking these medications   Resource ThickenUp Clear Powd Stopped by: Granville Lewis, PA-C     TAKE these medications   aspirin 81 MG tablet Take 81 mg by mouth daily.   atorvastatin 40 MG tablet Commonly known as: LIPITOR Take 1 tablet (40 mg total) by mouth daily.   bisacodyl 10 MG suppository Commonly known as: DULCOLAX If not relieved by MOM, give 10 mg Bisacodyl suppositiory rectally X 1 dose in 24 hours as needed (Do not use constipation standing orders for residents with renal failure/CFR less than 30. Contact MD for orders) (Physician Order)   bisacodyl 5 MG EC tablet Commonly known as: DULCOLAX Take 1 tablet (5 mg total) by mouth daily as needed for moderate constipation.   clopidogrel 75 MG tablet Commonly known as: PLAVIX Take 1 tablet (75 mg total) by mouth daily.   magnesium hydroxide 400 MG/5ML suspension Commonly known as: MILK OF MAGNESIA If no BM in 3 days, give 30 cc Milk of Magnesium p.o. x 1 dose in 24 hours as needed (Do not use standing constipation orders for residents with renal failure CFR less than 30. Contact MD for orders) (Physician Order)   metoprolol tartrate 25 MG tablet Commonly known as: LOPRESSOR Take 1 tablet (25 mg total) by mouth 2 (two) times daily.   NON FORMULARY DIET:Puree with nectar thick liquids   RA SALINE ENEMA RE If not relieved by Biscodyl suppository, give disposable Saline Enema rectally X 1 dose/24 hrs as needed (Do not use constipation standing orders for  residents with renal failure/CFR less than 30. Contact MD for orders)(Physician Or       Review of Systems   This is limited since patient does not speak much.  Per nursing no complaints of pain shortness of breath or chest pain-when asked she appears to deny any discomfort-actually her main concern was she wanted the head of her bed lowered a bit.    Immunization History  Administered Date(s) Administered  . Fluad Quad(high Dose 65+) 04/27/2019  . Influenza, High Dose Seasonal PF 05/03/2017, 05/19/2018  . Influenza,inj,Quad PF,6+ Mos 04/29/2013, 05/18/2014, 05/02/2015, 04/17/2016  . PFIZER SARS-COV-2 Vaccination 08/27/2019, 09/21/2019  . Pneumococcal Conjugate-13 05/11/2013  . Pneumococcal Polysaccharide-23 05/02/2015   Pertinent  Health Maintenance Due  Topic Date Due  . MAMMOGRAM  01/09/2019  . DEXA SCAN  12/26/2019  . INFLUENZA VACCINE  02/21/2020  . COLONOSCOPY  03/11/2024  . PNA vac  Low Risk Adult  Completed   Fall Risk  04/27/2019 08/05/2018 06/17/2018 12/25/2017 11/22/2017  Falls in the past year? 1 0 0 No No  Number falls in past yr: 0 - 0 - -  Injury with Fall? 1 - 0 - -   Functional Status Survey:    Vitals:   11/25/19 1112  BP: 114/78  Pulse: 85  Resp: 17  Temp: (!) 97.1 F (36.2 C)  TempSrc: Oral  SpO2: 96%  Weight: 117 lb 12.8 oz (53.4 kg)  Height: 5\' 3"  (1.6 m)   Body mass index is 20.87 kg/m. Physical Exam   In general this is a pleasant elderly female in no distress she appears to be lying comfortably in bed.  Her skin is warm and dry.  Eyes visual acuity appears to be intact pupils appear equal round reactive to light clear  Oropharynx was difficult to assess since she did not open her mouth very wide-mucous membranes appear to be moist.  Chest was clear to auscultation no labored breathing.  Heart was regular rate and rhythm cannot appreciate a murmur gallop or rub-she does not really have significant lower extremity edema.  Abdomen is  soft nontender with positive bowel sounds.  GU has an indwelling Foley catheter draining amber-colored urine  Musculoskeletal Limited exam since she is in bed but continues to have significant life sided weakness appears able to move her right-sided extremities although this was limited again since she is in bed.  Neurologic as noted above-- she is alert she is very soft-spoken with slightly slurred speech.  Psych difficult to do a full assessment since she did not speak much but did follow simple verbal commands without difficulty and was able to tell me her needs       Labs reviewed: Recent Labs    11/18/19 0343 11/19/19 0240 11/21/19 0308 11/22/19 0502 11/23/19 0356  NA 139   < > 147* 144 141  K 4.4   < > 3.6 3.2* 3.8  CL 108   < > 112* 112* 110  CO2 12*   < > 27 25 23   GLUCOSE 123*   < > 110* 113* 102*  BUN 21   < > 29* 25* 17  CREATININE 0.62   < > 0.65 0.60 0.56  CALCIUM 9.1   < > 9.2 8.8* 8.8*  MG 2.0  --  2.2  --  1.8  PHOS  --   --  2.8  --   --    < > = values in this interval not displayed.   Recent Labs    11/20/19 0332 11/21/19 0308 11/22/19 0502  AST 30 26 22   ALT 66* 51* 37  ALKPHOS 80 77 66  BILITOT 0.6 0.5 0.4  PROT 5.7* 5.7* 5.0*  ALBUMIN 2.3* 2.4* 2.1*   Recent Labs    11/21/19 0308 11/22/19 0502 11/23/19 0356  WBC 12.9* 13.9* 13.0*  NEUTROABS 10.2* 11.1* 10.5*  HGB 14.0 12.4 13.0  HCT 44.6 40.0 40.2  MCV 98.0 98.8 96.6  PLT 442* 411* 431*   Lab Results  Component Value Date   TSH 1.728 11/10/2019   Lab Results  Component Value Date   HGBA1C 5.1 11/11/2019   Lab Results  Component Value Date   CHOL 196 11/11/2019   HDL 65 11/11/2019   LDLCALC 115 (H) 11/11/2019   LDLDIRECT 116.0 04/27/2019   TRIG 78 11/11/2019   CHOLHDL 3.0 11/11/2019    Significant Diagnostic Results in last 30  days:  CT Code Stroke CTA Head W/WO contrast  Result Date: 11/10/2019 CLINICAL DATA:  Generalized weakness.  Encephalopathy. EXAM: CT  ANGIOGRAPHY HEAD AND NECK CT PERFUSION BRAIN TECHNIQUE: Multidetector CT imaging of the head and neck was performed using the standard protocol during bolus administration of intravenous contrast. Multiplanar CT image reconstructions and MIPs were obtained to evaluate the vascular anatomy. Carotid stenosis measurements (when applicable) are obtained utilizing NASCET criteria, using the distal internal carotid diameter as the denominator. Multiphase CT imaging of the brain was performed following IV bolus contrast injection. Subsequent parametric perfusion maps were calculated using RAPID software. CONTRAST:  29mL OMNIPAQUE IOHEXOL 350 MG/ML SOLN COMPARISON:  None. FINDINGS: CTA NECK FINDINGS Aortic arch: Standard 3 vessel aortic arch with minimal atherosclerotic plaque. Widely patent arch vessel origins. Right carotid system: Patent without evidence of stenosis, dissection, or significant atherosclerosis. Left carotid system: Patent without evidence of stenosis, dissection, or significant atherosclerosis. Vertebral arteries: Patent and codominant without evidence of stenosis, dissection, or significant atherosclerosis. Skeleton: Severe facet arthrosis at C3-4 and C4-5 with grade 1 anterolisthesis at both levels. Severe disc degeneration at C5-6 and C6-7. Severe multilevel neural foraminal stenosis in the cervical spine. Other neck: No evidence of cervical lymphadenopathy or mass. Upper chest: No apical lung consolidation or mass. Review of the MIP images confirms the above findings CTA HEAD FINDINGS Anterior circulation: The internal carotid arteries are patent from skull base to carotid termini with minimal nonstenotic plaque bilaterally. ACAs and MCAs are patent without evidence of proximal branch occlusion or significant proximal stenosis. No aneurysm is identified. Posterior circulation: The intracranial vertebral arteries are widely patent to the basilar. Patent PICA, AICA, and SCA origins are identified  bilaterally. Posterior communicating arteries are not clearly identified and may be diminutive or absent. The PCAs are patent without evidence of significant proximal stenosis. No aneurysm is identified. Venous sinuses: As permitted by contrast timing, patent. Anatomic variants: None. Review of the MIP images confirms the above findings CT Brain Perfusion Findings: CBF (<30%) Volume: 0 mL Perfusion (Tmax>6.0s) volume: 0 mL Mismatch Volume: n/a Infarction Location: None evident by CTP. IMPRESSION: 1. No large vessel occlusion or significant stenosis in the head and neck. 2. Negative CTP. 3. Advanced cervical disc and facet degeneration. 4.  Aortic Atherosclerosis (ICD10-I70.0). These results were communicated to Dr. Rory Percy at 10:53 am on 11/10/2019 by text page via the [facility] messaging system. Electronically Signed   By: Logan Bores M.D.   On: 11/10/2019 11:05   CT Code Stroke CTA Neck W/WO contrast  Result Date: 11/10/2019 CLINICAL DATA:  Generalized weakness.  Encephalopathy. EXAM: CT ANGIOGRAPHY HEAD AND NECK CT PERFUSION BRAIN TECHNIQUE: Multidetector CT imaging of the head and neck was performed using the standard protocol during bolus administration of intravenous contrast. Multiplanar CT image reconstructions and MIPs were obtained to evaluate the vascular anatomy. Carotid stenosis measurements (when applicable) are obtained utilizing NASCET criteria, using the distal internal carotid diameter as the denominator. Multiphase CT imaging of the brain was performed following IV bolus contrast injection. Subsequent parametric perfusion maps were calculated using RAPID software. CONTRAST:  29mL OMNIPAQUE IOHEXOL 350 MG/ML SOLN COMPARISON:  None. FINDINGS: CTA NECK FINDINGS Aortic arch: Standard 3 vessel aortic arch with minimal atherosclerotic plaque. Widely patent arch vessel origins. Right carotid system: Patent without evidence of stenosis, dissection, or significant atherosclerosis. Left carotid system: Patent  without evidence of stenosis, dissection, or significant atherosclerosis. Vertebral arteries: Patent and codominant without evidence of stenosis, dissection, or significant atherosclerosis. Skeleton:  Severe facet arthrosis at C3-4 and C4-5 with grade 1 anterolisthesis at both levels. Severe disc degeneration at C5-6 and C6-7. Severe multilevel neural foraminal stenosis in the cervical spine. Other neck: No evidence of cervical lymphadenopathy or mass. Upper chest: No apical lung consolidation or mass. Review of the MIP images confirms the above findings CTA HEAD FINDINGS Anterior circulation: The internal carotid arteries are patent from skull base to carotid termini with minimal nonstenotic plaque bilaterally. ACAs and MCAs are patent without evidence of proximal branch occlusion or significant proximal stenosis. No aneurysm is identified. Posterior circulation: The intracranial vertebral arteries are widely patent to the basilar. Patent PICA, AICA, and SCA origins are identified bilaterally. Posterior communicating arteries are not clearly identified and may be diminutive or absent. The PCAs are patent without evidence of significant proximal stenosis. No aneurysm is identified. Venous sinuses: As permitted by contrast timing, patent. Anatomic variants: None. Review of the MIP images confirms the above findings CT Brain Perfusion Findings: CBF (<30%) Volume: 0 mL Perfusion (Tmax>6.0s) volume: 0 mL Mismatch Volume: n/a Infarction Location: None evident by CTP. IMPRESSION: 1. No large vessel occlusion or significant stenosis in the head and neck. 2. Negative CTP. 3. Advanced cervical disc and facet degeneration. 4.  Aortic Atherosclerosis (ICD10-I70.0). These results were communicated to Dr. Rory Percy at 10:53 am on 11/10/2019 by text page via the [facility] messaging system. Electronically Signed   By: Logan Bores M.D.   On: 11/10/2019 11:05   MR BRAIN WO CONTRAST  Result Date: 11/10/2019 CLINICAL DATA:  Generalized  weakness. Encephalopathy. EXAM: MRI HEAD WITHOUT CONTRAST TECHNIQUE: Multiplanar, multiecho pulse sequences of the brain and surrounding structures were obtained without intravenous contrast. COMPARISON:  Head CT 11/10/2019 and MRI 02/28/2017 FINDINGS: Brain: There are patchy acute infarcts involving the basal ganglia/corona radiata bilaterally, left larger than right. A lacunar infarct in the posterior limb of the left internal capsule/lateral left thalamus is chronic but new from the prior MRI. T2 hyperintensities elsewhere in the cerebral white matter bilaterally have mildly progressed from the prior MRI and are nonspecific but compatible with mildly age advanced chronic small vessel ischemic disease. No intracranial hemorrhage, mass, midline shift, or extra-axial fluid collection is identified. Mild cerebral atrophy is within normal limits for age. Vascular: Major intracranial vascular flow voids are preserved. Skull and upper cervical spine: Unremarkable bone marrow signal. C3-4 facet arthrosis with grade 1 anterolisthesis. Sinuses/Orbits: Right cataract extraction. Paranasal sinuses and mastoid air cells are clear. Other: None. IMPRESSION: 1. Patchy acute infarcts involving the left greater than right basal ganglia and corona radiata. 2. Mild chronic small vessel ischemic disease. Electronically Signed   By: Logan Bores M.D.   On: 11/10/2019 11:28   CT Code Stroke Cerebral Perfusion with contrast  Result Date: 11/10/2019 CLINICAL DATA:  Generalized weakness.  Encephalopathy. EXAM: CT ANGIOGRAPHY HEAD AND NECK CT PERFUSION BRAIN TECHNIQUE: Multidetector CT imaging of the head and neck was performed using the standard protocol during bolus administration of intravenous contrast. Multiplanar CT image reconstructions and MIPs were obtained to evaluate the vascular anatomy. Carotid stenosis measurements (when applicable) are obtained utilizing NASCET criteria, using the distal internal carotid diameter as the  denominator. Multiphase CT imaging of the brain was performed following IV bolus contrast injection. Subsequent parametric perfusion maps were calculated using RAPID software. CONTRAST:  27mL OMNIPAQUE IOHEXOL 350 MG/ML SOLN COMPARISON:  None. FINDINGS: CTA NECK FINDINGS Aortic arch: Standard 3 vessel aortic arch with minimal atherosclerotic plaque. Widely patent arch vessel origins. Right carotid  system: Patent without evidence of stenosis, dissection, or significant atherosclerosis. Left carotid system: Patent without evidence of stenosis, dissection, or significant atherosclerosis. Vertebral arteries: Patent and codominant without evidence of stenosis, dissection, or significant atherosclerosis. Skeleton: Severe facet arthrosis at C3-4 and C4-5 with grade 1 anterolisthesis at both levels. Severe disc degeneration at C5-6 and C6-7. Severe multilevel neural foraminal stenosis in the cervical spine. Other neck: No evidence of cervical lymphadenopathy or mass. Upper chest: No apical lung consolidation or mass. Review of the MIP images confirms the above findings CTA HEAD FINDINGS Anterior circulation: The internal carotid arteries are patent from skull base to carotid termini with minimal nonstenotic plaque bilaterally. ACAs and MCAs are patent without evidence of proximal branch occlusion or significant proximal stenosis. No aneurysm is identified. Posterior circulation: The intracranial vertebral arteries are widely patent to the basilar. Patent PICA, AICA, and SCA origins are identified bilaterally. Posterior communicating arteries are not clearly identified and may be diminutive or absent. The PCAs are patent without evidence of significant proximal stenosis. No aneurysm is identified. Venous sinuses: As permitted by contrast timing, patent. Anatomic variants: None. Review of the MIP images confirms the above findings CT Brain Perfusion Findings: CBF (<30%) Volume: 0 mL Perfusion (Tmax>6.0s) volume: 0 mL Mismatch  Volume: n/a Infarction Location: None evident by CTP. IMPRESSION: 1. No large vessel occlusion or significant stenosis in the head and neck. 2. Negative CTP. 3. Advanced cervical disc and facet degeneration. 4.  Aortic Atherosclerosis (ICD10-I70.0). These results were communicated to Dr. Rory Percy at 10:53 am on 11/10/2019 by text page via the [facility] messaging system. Electronically Signed   By: Logan Bores M.D.   On: 11/10/2019 11:05   DG Chest Portable 1 View  Result Date: 11/10/2019 CLINICAL DATA:  Altered mental status. EXAM: PORTABLE CHEST 1 VIEW COMPARISON:  12/25/2017 FINDINGS: The heart size and mediastinal contours are within normal limits. Both lungs are clear. The visualized skeletal structures are unremarkable. IMPRESSION: Normal exam. Electronically Signed   By: Lorriane Shire M.D.   On: 11/10/2019 13:22   ECHOCARDIOGRAM COMPLETE  Result Date: 11/11/2019    ECHOCARDIOGRAM REPORT   Patient Name:   Lynn [name] Date of Exam: 11/11/2019 Medical Rec #:  [id number]            Height:       63.0 in Accession #:    [id number]           Weight:       127.9 lb Date of Birth:  March 12, 1946           BSA:          1.599 m Patient Age:    26 years             BP:           165/86 mmHg Patient Gender: F                    HR:           69 bpm. Exam Location:  Inpatient Procedure: 2D Echo, Cardiac Doppler, Color Doppler and 3D Echo Indications:    Stroke  History:        Patient has no prior history of Echocardiogram examinations.                 Stroke; Risk Factors:Hypertension and Dyslipidemia.  Sonographer:    Roseanna Rainbow RDCS Referring Phys: [place]  1. Left ventricular ejection fraction, by estimation,  is 60 to 65%. The left ventricle has normal function. The left ventricle has no regional wall motion abnormalities. There is mild concentric left ventricular hypertrophy. Left ventricular diastolic parameters are consistent with Grade I diastolic dysfunction (impaired relaxation).   2. Right ventricular systolic function is normal. The right ventricular size is normal. Tricuspid regurgitation signal is inadequate for assessing PA pressure.  3. The mitral valve is grossly normal. No evidence of mitral valve regurgitation. No evidence of mitral stenosis.  4. The aortic valve is tricuspid. Aortic valve regurgitation is mild. No aortic stenosis is present.  5. The inferior vena cava is normal in size with greater than 50% respiratory variability, suggesting right atrial pressure of 3 mmHg. FINDINGS  Left Ventricle: Left ventricular ejection fraction, by estimation, is 60 to 65%. The left ventricle has normal function. The left ventricle has no regional wall motion abnormalities. The left ventricular internal cavity size was normal in size. There is  mild concentric left ventricular hypertrophy. Left ventricular diastolic parameters are consistent with Grade I diastolic dysfunction (impaired relaxation). Normal left ventricular filling pressure. Right Ventricle: The right ventricular size is normal. No increase in right ventricular wall thickness. Right ventricular systolic function is normal. Tricuspid regurgitation signal is inadequate for assessing PA pressure. Left Atrium: Left atrial size was normal in size. Right Atrium: Right atrial size was normal in size. Pericardium: Trivial pericardial effusion is present. Presence of pericardial fat pad. Mitral Valve: The mitral valve is grossly normal. No evidence of mitral valve regurgitation. No evidence of mitral valve stenosis. Tricuspid Valve: The tricuspid valve is grossly normal. Tricuspid valve regurgitation is not demonstrated. No evidence of tricuspid stenosis. Aortic Valve: The aortic valve is tricuspid. Aortic valve regurgitation is mild. Aortic regurgitation PHT measures 411 msec. No aortic stenosis is present. Aortic valve mean gradient measures 4.0 mmHg. Aortic valve peak gradient measures 7.3 mmHg. Aortic  valve area, by VTI measures  1.66 cm. Pulmonic Valve: The pulmonic valve was grossly normal. Pulmonic valve regurgitation is not visualized. No evidence of pulmonic stenosis. Aorta: The aortic root and ascending aorta are structurally normal, with no evidence of dilitation. Venous: The inferior vena cava is normal in size with greater than 50% respiratory variability, suggesting right atrial pressure of 3 mmHg. IAS/Shunts: The atrial septum is grossly normal.  LEFT VENTRICLE PLAX 2D LVIDd:         3.20 cm     Diastology LVIDs:         2.10 cm     LV e' lateral:   5.98 cm/s LV PW:         1.20 cm     LV E/e' lateral: 6.2 LV IVS:        1.30 cm     LV e' medial:    4.41 cm/s LVOT diam:     1.70 cm     LV E/e' medial:  8.4 LV SV:         41 LV SV Index:   26 LVOT Area:     2.27 cm  LV Volumes (MOD) LV vol d, MOD A2C: 57.3 ml LV vol d, MOD A4C: 59.1 ml LV vol s, MOD A2C: 16.1 ml LV vol s, MOD A4C: 22.1 ml LV SV MOD A2C:     41.2 ml LV SV MOD A4C:     59.1 ml LV SV MOD BP:      43.9 ml RIGHT VENTRICLE  IVC RV S prime:     13.60 cm/s  IVC diam: 1.00 cm TAPSE (M-mode): 2.1 cm LEFT ATRIUM             Index       RIGHT ATRIUM          Index LA diam:        2.70 cm 1.69 cm/m  RA Area:     9.88 cm LA Vol (A2C):   16.5 ml 10.32 ml/m RA Volume:   20.50 ml 12.82 ml/m LA Vol (A4C):   16.3 ml 10.19 ml/m LA Biplane Vol: 18.0 ml 11.26 ml/m  AORTIC VALVE AV Area (Vmax):    1.48 cm AV Area (Vmean):   1.55 cm AV Area (VTI):     1.66 cm AV Vmax:           135.00 cm/s AV Vmean:          88.700 cm/s AV VTI:            0.246 m AV Peak Grad:      7.3 mmHg AV Mean Grad:      4.0 mmHg LVOT Vmax:         88.30 cm/s LVOT Vmean:        60.500 cm/s LVOT VTI:          0.180 m LVOT/AV VTI ratio: 0.73 AI PHT:            411 msec  AORTA Ao Root diam: 2.60 cm Ao Asc diam:  3.90 cm MITRAL VALVE MV Area (PHT): 3.27 cm    SHUNTS MV Decel Time: 232 msec    Systemic VTI:  0.18 m MV E velocity: 37.20 cm/s  Systemic Diam: 1.70 cm MV A velocity: 79.40 cm/s MV E/A  ratio:  0.47 Eleonore Chiquito MD Electronically signed by Eleonore Chiquito MD Signature Date/Time: 11/11/2019/5:18:18 PM    Final    CT HEAD CODE STROKE WO CONTRAST  Result Date: 11/10/2019 CLINICAL DATA:  Code stroke.  Generalized weakness. EXAM: CT HEAD WITHOUT CONTRAST TECHNIQUE: Contiguous axial images were obtained from the base of the skull through the vertex without intravenous contrast. COMPARISON:  Head MRI 02/28/2017 FINDINGS: Brain: There is no evidence of acute large territory infarct, intracranial hemorrhage, mass, midline shift, or extra-axial fluid collection. Mild cerebral atrophy is within normal limits for age. Patchy hypodensities in the cerebral white matter bilaterally are nonspecific but compatible with mild to moderate chronic small vessel ischemic disease. Asymmetric, more focal hypodensities are present in the left basal ganglia, left internal capsule, and left corona radiata, new from the prior MRI and compatible with age indeterminate lacunar infarcts. Vascular: Calcified atherosclerosis at the skull base. No hyperdense vessel. Skull: No fracture or suspicious osseous lesion. Sinuses/Orbits: Visualized paranasal sinuses and mastoid air cells are clear. Right cataract extraction is noted. Other: None. ASPECTS [facility] Stroke Program Early CT Score) Not scored due to the lack of localizing symptoms. IMPRESSION: 1. Age indeterminate though possibly recent left basal ganglia region infarcts. 2. No evidence of acute cortically based infarct or intracranial hemorrhage. 3. Mild-to-moderate chronic small vessel ischemic disease. These findings were discussed via telephone with Dr. Rory Percy at 10:18 am on 11/10/2019. Electronically Signed   By: Logan Bores M.D.   On: 11/10/2019 10:22    Assessment/Plan #1--history of CVA-bilateral infarcts of basal ganglia and corona radiata with chronic small vessel disease--as noted in discussion above was evaluated by neurology is on aspirin and Plavix for 26-month  course and  then transition to Plavix only.  Also recommendation for a 30-day cardiac event monitor to rule out atrial fibrillation.  He will need extensive PT and OT-continues with significant left-sided weakness at this point will monitor.  2.  Altered mental status again this was thought secondary to the Duck she received in the hospital.  She does have some history of cognitive decline-at this point continue supportive care.  3.  History of dysphagia status post CVA she has been cleared for a dysphagia 1 diet with nectar thick liquids   #4 history of hypertension she is on Lopressor 25 mg twice daily at this point will monitor blood pressure today 114/78.  5.  History of hyperlipidemia LDL was 115 in the hospital HDL was in the 60s-Lipitor was started at 40 mg a day-suggestion for liver function tests in 4 to 5 weeks.  6.  History of hypokalemia and this was repleted in the hospital last potassium was 3.8 will update a BMP.  7.  History of hyponatremia this was thought secondary to mild dehydration-she did receive volume resuscitation sodium was 141 on lab done 2 days ago will update this.  8.  History of acute urinary retention she does have a catheter which with a history apparently of neurogenic bladder complicated with immobility and constipation.--Recommendation is to attempt voiding trial here in skilled nursing  #9 history of right ankle pain at this point will monitor recommendation for orthopedic follow-up with Dr. Isac Sarna a small avulsion  Of note an updated CBC and metabolic panel is pending.  It appears she had some leukocytosis in the hospital with white count running around 13,000-there may be a reactive component to this-.  At this point no complaints of fever chills she is afebrile no notation of cough or chest congestion- no apparent diarrhea-will monitor.  [id number] greater than 40 minutes spent assessing patient-reviewing her chart and labs-discussing  her status with nursing staff-and coordinating and formulating a plan of care-of note  more than 50% of time spent coordinating a plan of care with input as noted above

## 2019-11-27 ENCOUNTER — Non-Acute Institutional Stay (SKILLED_NURSING_FACILITY): Payer: Medicare Other | Admitting: Internal Medicine

## 2019-11-27 ENCOUNTER — Encounter: Payer: Medicare Other | Admitting: Family Medicine

## 2019-11-27 ENCOUNTER — Encounter: Payer: Self-pay | Admitting: Internal Medicine

## 2019-11-27 ENCOUNTER — Ambulatory Visit: Payer: Medicare Other

## 2019-11-27 DIAGNOSIS — I10 Essential (primary) hypertension: Secondary | ICD-10-CM

## 2019-11-27 DIAGNOSIS — I639 Cerebral infarction, unspecified: Secondary | ICD-10-CM | POA: Diagnosis not present

## 2019-11-27 DIAGNOSIS — I700 Atherosclerosis of aorta: Secondary | ICD-10-CM

## 2019-11-27 DIAGNOSIS — I69354 Hemiplegia and hemiparesis following cerebral infarction affecting left non-dominant side: Secondary | ICD-10-CM | POA: Insufficient documentation

## 2019-11-27 DIAGNOSIS — F321 Major depressive disorder, single episode, moderate: Secondary | ICD-10-CM

## 2019-11-27 DIAGNOSIS — M810 Age-related osteoporosis without current pathological fracture: Secondary | ICD-10-CM

## 2019-11-27 DIAGNOSIS — I69391 Dysphagia following cerebral infarction: Secondary | ICD-10-CM

## 2019-11-27 DIAGNOSIS — E876 Hypokalemia: Secondary | ICD-10-CM

## 2019-11-27 DIAGNOSIS — D72825 Bandemia: Secondary | ICD-10-CM

## 2019-11-27 DIAGNOSIS — Z978 Presence of other specified devices: Secondary | ICD-10-CM

## 2019-11-27 DIAGNOSIS — F320 Major depressive disorder, single episode, mild: Secondary | ICD-10-CM | POA: Insufficient documentation

## 2019-11-27 DIAGNOSIS — D692 Other nonthrombocytopenic purpura: Secondary | ICD-10-CM

## 2019-11-27 DIAGNOSIS — F32A Depression, unspecified: Secondary | ICD-10-CM | POA: Insufficient documentation

## 2019-11-27 DIAGNOSIS — R4701 Aphasia: Secondary | ICD-10-CM

## 2019-11-27 DIAGNOSIS — R498 Other voice and resonance disorders: Secondary | ICD-10-CM | POA: Diagnosis not present

## 2019-11-27 DIAGNOSIS — E7800 Pure hypercholesterolemia, unspecified: Secondary | ICD-10-CM

## 2019-11-27 DIAGNOSIS — R339 Retention of urine, unspecified: Secondary | ICD-10-CM | POA: Insufficient documentation

## 2019-11-27 DIAGNOSIS — E440 Moderate protein-calorie malnutrition: Secondary | ICD-10-CM

## 2019-11-27 NOTE — Progress Notes (Signed)
Provider:  Rexene Edison. Mariea Clonts, D.O., C.M.D. Location:  [place] Room Number: [id number] Place of Service:  SNF (31)  PCP: Marin Olp, MD Patient Care Team: Marin Olp, MD as PCP - General (Family Medicine) Janne Lab, MD (Obstetrics and Gynecology)  Extended Emergency Contact Information Primary Emergency Contact: [facility] Address: [address]          [address]          [place], [place] [address] [place] Phone: [phone] Mobile Phone: [phone] Relation: Mother Secondary Emergency Contact: quanetta, schlau Mobile Phone: [phone] Relation: Brother  Code Status: FULL CODE upon admission--will see how she is doing over the next few weeks and discuss with her brother   Goals of Care: Advanced Directive information Advanced Directives 11/27/2019  Does Patient Have a Medical Advance Directive? No  Type of Advance Directive -  Does patient want to make changes to medical advance directive? No - Guardian declined  Would patient like information on creating a medical advance directive? -  Brother, Clair Gulling, is Printmaker Complaint  Patient presents with  . New Admit To SNF    New admit to establish care     HPI: Patient is a 74 y.o. female with h/o htn, hyperlipidemia, and depression/anxiety, osteoporosis, OA, vitamin D deficiency, osteoporosis seen today for admission to [facility] s/p hospitalization from 4/20-11/24/19 with new onset of dysarthria, expressive aphasia, confusion and left arm weakness.  In the ED, she had left arm weakness and slurred speech.  MRI confirmed bilateral CVAs left greater than right (basal ganglia and corona radiata and mild small vessel disease).  Her LDL was elevated and she was started on statin therapy with lipitor and lipids will need to be rechecked in 4-6 wks.  She was also put on plavix in addition to aspirin for a planned 3 month course after which  she is to take plavix alone.  Due to the unusual bilateral involvement, she is being set up for a 30 day cardiac event monitor to r/o afib.  She is to f/u with Dr. Delice Lesch from neurology on 12/07/19. At this time, she is fully dependent in all ADLs and spending her days in bed--just beginning therapy.    She was noted to have considerable dysphagia and speech language pathology started her on a pureed diet with nectar thickened liquids.  Here she is not eating much at all, but is drinking fairly well per nursing.  She is now on medpass 4oz bid supplement which she had at bedside and expressed interest in drinking, but could not do without assistance.  Per family, she had some cognitive impairment pre-stroke.  Await MDS cognitive evaluation.  BPs have been satisfactory here on current regimen.    She had urinary retention felt to be a mix of neurogenic bladder, immobility and constipation (also has h/o ureterovaginal prolapse and incontinence).  She failed a voiding trial at the hospital 5/1 and trial of urecholine was initiated with recommendation that voiding trial be repeated.  SNF facility preferred to have urology eval (was not sent on urecholine).  Bowels are moving well here.    She had right foot and ankle pain and a small avulsion fx noted on the medial talus so on call ortho at hospital recommended a postop shoe when ambulatory and f/u with Dr. Doran Durand outpatient.   She also admits to depression which it appears she had a mild  case of prior to this tragic stroke.     Past Medical History:  Diagnosis Date  . Anxiety   . Aortic atherosclerosis ([place])   . Arthritis   . Complication of anesthesia    per pt, slow to wake up past sedation.  . CVA (cerebral vascular accident) ([place]) 11/10/2019  . Depression   . Hyperplastic colon polyp   . Hypertension   . Neuropathy   . Osteoarthritis   . Osteoporosis   . Stomach upset    has a senstive stomach  . Throat clearing    has a cough at times   . Urinary incontinence   . Vitamin D deficiency    Past Surgical History:  Procedure Laterality Date  . COLONOSCOPY    . FACIAL COSMETIC SURGERY     over 15 years ago  . FINGER TENDON REPAIR     middle rt hand-  . HAMMER TOE SURGERY  12/27/2011   Procedure: HAMMER TOE CORRECTION;  Surgeon: Wylene Simmer, MD;  Location: [facility];  Service: Orthopedics;  Laterality: Left;  2-4th    reports that she quit smoking about 43 years ago. She quit after 10.00 years of use. She has never used smokeless tobacco. She reports current alcohol use. She reports that she does not use drugs. Social History   Socioeconomic History  . Marital status: Divorced    Spouse name: Not on file  . Number of children: 1  . Years of education: 70  . Highest education level: Not on file  Occupational History  . Occupation: retired  Tobacco Use  . Smoking status: Former Smoker    Years: 10.00    Quit date: 12/21/1975    Years since quitting: 43.9  . Smokeless tobacco: Never Used  Substance and Sexual Activity  . Alcohol use: Yes    Comment: rarely  . Drug use: No  . Sexual activity: Not Currently  Other Topics Concern  . Not on file  Social History Narrative   Divorced. Children: 1 son. Patient supports her mother- mother lives with her- 2 story home.       Retired from [employer]  Education: college.       Hobbies: riding horses in the past, family time, enjoys some gardening   Social Determinants of Health   Financial Resource Strain:   . Difficulty of Paying Living Expenses:   Food Insecurity:   . Worried About Charity fundraiser in the Last Year:   . Arboriculturist in the Last Year:   Transportation Needs:   . Film/video editor (Medical):   Marland Kitchen Lack of Transportation (Non-Medical):   Physical Activity:   . Days of Exercise per Week:   . Minutes of Exercise per Session:   Stress:   . Feeling of Stress :   Social Connections:   . Frequency of Communication  with Friends and Family:   . Frequency of Social Gatherings with Friends and Family:   . Attends Religious Services:   . Active Member of Clubs or Organizations:   . Attends Archivist Meetings:   Marland Kitchen Marital Status:   Intimate Partner Violence:   . Fear of Current or Ex-Partner:   . Emotionally Abused:   Marland Kitchen Physically Abused:   . Sexually Abused:     Functional Status Survey:    Family History  Problem Relation Age of Onset  . Arthritis Mother   . Colon cancer Father   . COPD  Father 72       died at 13  . Throat cancer Brother   . Breast cancer Other   . Esophageal cancer Neg Hx   . Rectal cancer Neg Hx   . Stomach cancer Neg Hx   . Stroke Neg Hx     Health Maintenance  Topic Date Due  . MAMMOGRAM  01/09/2019  . DEXA SCAN  12/26/2019  . INFLUENZA VACCINE  02/21/2020  . TETANUS/TDAP  09/20/2020  . COLONOSCOPY  03/11/2024  . COVID-19 Vaccine  Completed  . Hepatitis C Screening  Completed  . PNA vac Low Risk Adult  Completed    Allergies  Allergen Reactions  . Chlorhexidine     Outpatient Encounter Medications as of 11/27/2019  Medication Sig  . aspirin 81 MG tablet Take 81 mg by mouth daily.  Marland Kitchen atorvastatin (LIPITOR) 40 MG tablet Take 1 tablet (40 mg total) by mouth daily.  . bisacodyl (DULCOLAX) 10 MG suppository If not relieved by MOM, give 10 mg Bisacodyl suppositiory rectally X 1 dose in 24 hours as needed (Do not use constipation standing orders for residents with renal failure/CFR less than 30. Contact MD for orders) (Physician Order)  . clopidogrel (PLAVIX) 75 MG tablet Take 1 tablet (75 mg total) by mouth daily.  . metoprolol tartrate (LOPRESSOR) 25 MG tablet Take 1 tablet (25 mg total) by mouth 2 (two) times daily.  . NON FORMULARY DIET:Puree with nectar thick liquids  . [DISCONTINUED] bisacodyl (DULCOLAX) 5 MG EC tablet Take 1 tablet (5 mg total) by mouth daily as needed for moderate constipation.  . [DISCONTINUED] magnesium hydroxide (MILK OF  MAGNESIA) 400 MG/5ML suspension If no BM in 3 days, give 30 cc Milk of Magnesium p.o. x 1 dose in 24 hours as needed (Do not use standing constipation orders for residents with renal failure CFR less than 30. Contact MD for orders) (Physician Order)  . [DISCONTINUED] Sodium Phosphates (RA SALINE ENEMA RE) If not relieved by Biscodyl suppository, give disposable Saline Enema rectally X 1 dose/24 hrs as needed (Do not use constipation standing orders for residents with renal failure/CFR less than 30. Contact MD for orders)(Physician Or   No facility-administered encounter medications on file as of 11/27/2019.    Review of Systems  Constitutional: Positive for activity change, appetite change, fatigue and unexpected weight change. Negative for chills and fever.       Down 10 lbs since hospital admission date  HENT: Positive for hearing loss, trouble swallowing and voice change. Negative for congestion and sore throat.   Eyes: Negative for visual disturbance.  Respiratory: Positive for cough. Negative for chest tightness and shortness of breath.   Cardiovascular: Negative for chest pain, palpitations and leg swelling.  Gastrointestinal: Negative for abdominal pain, constipation, diarrhea, nausea and vomiting.  Genitourinary: Negative for dysuria.       Foley with dark yellow urine; h/o ureterovaginal prolapse and incontinence   Musculoskeletal: Positive for gait problem.       Right foot pain  Skin: Positive for pallor. Negative for color change.  Neurological: Positive for speech difficulty and weakness. Negative for dizziness and headaches.  Hematological: Bruises/bleeds easily.  Psychiatric/Behavioral: Positive for confusion. Negative for agitation, behavioral problems and sleep disturbance. The patient is not nervous/anxious.        Depression    Vitals:   11/27/19 1047  BP: 131/86  Pulse: 78  Temp: 99 F (37.2 C)  SpO2: 96%  Weight: 117 lb 12.8 oz (53.4 kg)  Height: 5\' 3"  (1.6 m)    Body mass index is 20.87 kg/m. Physical Exam Vitals and nursing note reviewed.  Constitutional:      General: She is not in acute distress.    Appearance: She is not toxic-appearing.  HENT:     Head: Normocephalic and atraumatic.     Right Ear: External ear normal.     Left Ear: External ear normal.     Nose: Nose normal.     Mouth/Throat:     Mouth: Mucous membranes are dry.     Pharynx: Oropharynx is clear.  Eyes:     Conjunctiva/sclera: Conjunctivae normal.     Pupils: Pupils are equal, round, and reactive to light.  Cardiovascular:     Rate and Rhythm: Normal rate and regular rhythm.     Pulses: Normal pulses.     Heart sounds: Normal heart sounds.  Pulmonary:     Effort: Pulmonary effort is normal.     Breath sounds: Rhonchi present.  Abdominal:     General: Bowel sounds are normal. There is no distension.     Palpations: Abdomen is soft. There is no mass.     Tenderness: There is no abdominal tenderness. There is no guarding or rebound.  Musculoskeletal:     Right lower leg: No edema.     Left lower leg: No edema.     Comments: See neuro below  Skin:    General: Skin is warm and dry.     Capillary Refill: Capillary refill takes less than 2 seconds.     Coloration: Skin is pale.  Neurological:     Motor: Weakness present.     Gait: Gait abnormal.     Comments: Left flaccid hemiplegia; upgoing toe on left, tenderness of right ankle; some aphasia and hypophonia  Psychiatric:     Comments: Flat affect     Labs reviewed: Basic Metabolic Panel: Recent Labs    11/18/19 0343 11/19/19 0240 11/21/19 0308 11/21/19 0308 11/22/19 0502 11/23/19 0356 11/25/19 0000  NA 139   < > 147*   < > 144 141 145  K 4.4   < > 3.6   < > 3.2* 3.8 4.0  CL 108   < > 112*   < > 112* 110 107  CO2 12*   < > 27   < > 25 23 22   GLUCOSE 123*   < > 110*  --  113* 102*  --   BUN 21   < > 29*   < > 25* 17 32*  CREATININE 0.62   < > 0.65   < > 0.60 0.56 0.5  CALCIUM 9.1   < > 9.2   <  > 8.8* 8.8* 9.4  MG 2.0  --  2.2  --   --  1.8  --   PHOS  --   --  2.8  --   --   --   --    < > = values in this interval not displayed.   Liver Function Tests: Recent Labs    11/20/19 0332 11/21/19 0308 11/22/19 0502  AST 30 26 22   ALT 66* 51* 37  ALKPHOS 80 77 66  BILITOT 0.6 0.5 0.4  PROT 5.7* 5.7* 5.0*  ALBUMIN 2.3* 2.4* 2.1*   No results for input(s): LIPASE, AMYLASE in the last 8760 hours. Recent Labs    11/14/19 0057 11/19/19 0240  AMMONIA 17 19   CBC: Recent Labs    11/21/19 0308  11/21/19 0308 11/22/19 0502 11/23/19 0356 11/25/19 0000  WBC 12.9*   < > 13.9* 13.0* 13.3  NEUTROABS 10.2*  --  11.1* 10.5*  --   HGB 14.0   < > 12.4 13.0 13.3  HCT 44.6   < > 40.0 40.2 41  MCV 98.0  --  98.8 96.6  --   PLT 442*   < > 411* 431* 455*   < > = values in this interval not displayed.   Cardiac Enzymes: Recent Labs    11/20/19 0332  CKTOTAL 35*   BNP: Invalid input(s): POCBNP Lab Results  Component Value Date   HGBA1C 5.1 11/11/2019   Lab Results  Component Value Date   TSH 1.728 11/10/2019   Lab Results  Component Value Date   [id number] 05/09/2017   Lab Results  Component Value Date   FOLATE 12.9 05/09/2017   No results found for: IRON, TIBC, FERRITIN  Imaging and Procedures obtained prior to SNF admission: CT Code Stroke CTA Head W/WO contrast  Result Date: 11/10/2019 CLINICAL DATA:  Generalized weakness.  Encephalopathy. EXAM: CT ANGIOGRAPHY HEAD AND NECK CT PERFUSION BRAIN TECHNIQUE: Multidetector CT imaging of the head and neck was performed using the standard protocol during bolus administration of intravenous contrast. Multiplanar CT image reconstructions and MIPs were obtained to evaluate the vascular anatomy. Carotid stenosis measurements (when applicable) are obtained utilizing NASCET criteria, using the distal internal carotid diameter as the denominator. Multiphase CT imaging of the brain was performed following IV bolus contrast  injection. Subsequent parametric perfusion maps were calculated using RAPID software. CONTRAST:  32mL OMNIPAQUE IOHEXOL 350 MG/ML SOLN COMPARISON:  None. FINDINGS: CTA NECK FINDINGS Aortic arch: Standard 3 vessel aortic arch with minimal atherosclerotic plaque. Widely patent arch vessel origins. Right carotid system: Patent without evidence of stenosis, dissection, or significant atherosclerosis. Left carotid system: Patent without evidence of stenosis, dissection, or significant atherosclerosis. Vertebral arteries: Patent and codominant without evidence of stenosis, dissection, or significant atherosclerosis. Skeleton: Severe facet arthrosis at C3-4 and C4-5 with grade 1 anterolisthesis at both levels. Severe disc degeneration at C5-6 and C6-7. Severe multilevel neural foraminal stenosis in the cervical spine. Other neck: No evidence of cervical lymphadenopathy or mass. Upper chest: No apical lung consolidation or mass. Review of the MIP images confirms the above findings CTA HEAD FINDINGS Anterior circulation: The internal carotid arteries are patent from skull base to carotid termini with minimal nonstenotic plaque bilaterally. ACAs and MCAs are patent without evidence of proximal branch occlusion or significant proximal stenosis. No aneurysm is identified. Posterior circulation: The intracranial vertebral arteries are widely patent to the basilar. Patent PICA, AICA, and SCA origins are identified bilaterally. Posterior communicating arteries are not clearly identified and may be diminutive or absent. The PCAs are patent without evidence of significant proximal stenosis. No aneurysm is identified. Venous sinuses: As permitted by contrast timing, patent. Anatomic variants: None. Review of the MIP images confirms the above findings CT Brain Perfusion Findings: CBF (<30%) Volume: 0 mL Perfusion (Tmax>6.0s) volume: 0 mL Mismatch Volume: n/a Infarction Location: None evident by CTP. IMPRESSION: 1. No large vessel  occlusion or significant stenosis in the head and neck. 2. Negative CTP. 3. Advanced cervical disc and facet degeneration. 4.  Aortic Atherosclerosis (ICD10-I70.0). These results were communicated to Dr. Rory Percy at 10:53 am on 11/10/2019 by text page via the [facility] messaging system. Electronically Signed   By: Logan Bores M.D.   On: 11/10/2019 11:05   CT Code Stroke CTA Neck  W/WO contrast  Result Date: 11/10/2019 CLINICAL DATA:  Generalized weakness.  Encephalopathy. EXAM: CT ANGIOGRAPHY HEAD AND NECK CT PERFUSION BRAIN TECHNIQUE: Multidetector CT imaging of the head and neck was performed using the standard protocol during bolus administration of intravenous contrast. Multiplanar CT image reconstructions and MIPs were obtained to evaluate the vascular anatomy. Carotid stenosis measurements (when applicable) are obtained utilizing NASCET criteria, using the distal internal carotid diameter as the denominator. Multiphase CT imaging of the brain was performed following IV bolus contrast injection. Subsequent parametric perfusion maps were calculated using RAPID software. CONTRAST:  25mL OMNIPAQUE IOHEXOL 350 MG/ML SOLN COMPARISON:  None. FINDINGS: CTA NECK FINDINGS Aortic arch: Standard 3 vessel aortic arch with minimal atherosclerotic plaque. Widely patent arch vessel origins. Right carotid system: Patent without evidence of stenosis, dissection, or significant atherosclerosis. Left carotid system: Patent without evidence of stenosis, dissection, or significant atherosclerosis. Vertebral arteries: Patent and codominant without evidence of stenosis, dissection, or significant atherosclerosis. Skeleton: Severe facet arthrosis at C3-4 and C4-5 with grade 1 anterolisthesis at both levels. Severe disc degeneration at C5-6 and C6-7. Severe multilevel neural foraminal stenosis in the cervical spine. Other neck: No evidence of cervical lymphadenopathy or mass. Upper chest: No apical lung consolidation or mass. Review of the  MIP images confirms the above findings CTA HEAD FINDINGS Anterior circulation: The internal carotid arteries are patent from skull base to carotid termini with minimal nonstenotic plaque bilaterally. ACAs and MCAs are patent without evidence of proximal branch occlusion or significant proximal stenosis. No aneurysm is identified. Posterior circulation: The intracranial vertebral arteries are widely patent to the basilar. Patent PICA, AICA, and SCA origins are identified bilaterally. Posterior communicating arteries are not clearly identified and may be diminutive or absent. The PCAs are patent without evidence of significant proximal stenosis. No aneurysm is identified. Venous sinuses: As permitted by contrast timing, patent. Anatomic variants: None. Review of the MIP images confirms the above findings CT Brain Perfusion Findings: CBF (<30%) Volume: 0 mL Perfusion (Tmax>6.0s) volume: 0 mL Mismatch Volume: n/a Infarction Location: None evident by CTP. IMPRESSION: 1. No large vessel occlusion or significant stenosis in the head and neck. 2. Negative CTP. 3. Advanced cervical disc and facet degeneration. 4.  Aortic Atherosclerosis (ICD10-I70.0). These results were communicated to Dr. Rory Percy at 10:53 am on 11/10/2019 by text page via the [facility] messaging system. Electronically Signed   By: Logan Bores M.D.   On: 11/10/2019 11:05   MR BRAIN WO CONTRAST  Result Date: 11/10/2019 CLINICAL DATA:  Generalized weakness. Encephalopathy. EXAM: MRI HEAD WITHOUT CONTRAST TECHNIQUE: Multiplanar, multiecho pulse sequences of the brain and surrounding structures were obtained without intravenous contrast. COMPARISON:  Head CT 11/10/2019 and MRI 02/28/2017 FINDINGS: Brain: There are patchy acute infarcts involving the basal ganglia/corona radiata bilaterally, left larger than right. A lacunar infarct in the posterior limb of the left internal capsule/lateral left thalamus is chronic but new from the prior MRI. T2 hyperintensities  elsewhere in the cerebral white matter bilaterally have mildly progressed from the prior MRI and are nonspecific but compatible with mildly age advanced chronic small vessel ischemic disease. No intracranial hemorrhage, mass, midline shift, or extra-axial fluid collection is identified. Mild cerebral atrophy is within normal limits for age. Vascular: Major intracranial vascular flow voids are preserved. Skull and upper cervical spine: Unremarkable bone marrow signal. C3-4 facet arthrosis with grade 1 anterolisthesis. Sinuses/Orbits: Right cataract extraction. Paranasal sinuses and mastoid air cells are clear. Other: None. IMPRESSION: 1. Patchy acute infarcts involving the left  greater than right basal ganglia and corona radiata. 2. Mild chronic small vessel ischemic disease. Electronically Signed   By: Logan Bores M.D.   On: 11/10/2019 11:28   CT Code Stroke Cerebral Perfusion with contrast  Result Date: 11/10/2019 CLINICAL DATA:  Generalized weakness.  Encephalopathy. EXAM: CT ANGIOGRAPHY HEAD AND NECK CT PERFUSION BRAIN TECHNIQUE: Multidetector CT imaging of the head and neck was performed using the standard protocol during bolus administration of intravenous contrast. Multiplanar CT image reconstructions and MIPs were obtained to evaluate the vascular anatomy. Carotid stenosis measurements (when applicable) are obtained utilizing NASCET criteria, using the distal internal carotid diameter as the denominator. Multiphase CT imaging of the brain was performed following IV bolus contrast injection. Subsequent parametric perfusion maps were calculated using RAPID software. CONTRAST:  74mL OMNIPAQUE IOHEXOL 350 MG/ML SOLN COMPARISON:  None. FINDINGS: CTA NECK FINDINGS Aortic arch: Standard 3 vessel aortic arch with minimal atherosclerotic plaque. Widely patent arch vessel origins. Right carotid system: Patent without evidence of stenosis, dissection, or significant atherosclerosis. Left carotid system: Patent  without evidence of stenosis, dissection, or significant atherosclerosis. Vertebral arteries: Patent and codominant without evidence of stenosis, dissection, or significant atherosclerosis. Skeleton: Severe facet arthrosis at C3-4 and C4-5 with grade 1 anterolisthesis at both levels. Severe disc degeneration at C5-6 and C6-7. Severe multilevel neural foraminal stenosis in the cervical spine. Other neck: No evidence of cervical lymphadenopathy or mass. Upper chest: No apical lung consolidation or mass. Review of the MIP images confirms the above findings CTA HEAD FINDINGS Anterior circulation: The internal carotid arteries are patent from skull base to carotid termini with minimal nonstenotic plaque bilaterally. ACAs and MCAs are patent without evidence of proximal branch occlusion or significant proximal stenosis. No aneurysm is identified. Posterior circulation: The intracranial vertebral arteries are widely patent to the basilar. Patent PICA, AICA, and SCA origins are identified bilaterally. Posterior communicating arteries are not clearly identified and may be diminutive or absent. The PCAs are patent without evidence of significant proximal stenosis. No aneurysm is identified. Venous sinuses: As permitted by contrast timing, patent. Anatomic variants: None. Review of the MIP images confirms the above findings CT Brain Perfusion Findings: CBF (<30%) Volume: 0 mL Perfusion (Tmax>6.0s) volume: 0 mL Mismatch Volume: n/a Infarction Location: None evident by CTP. IMPRESSION: 1. No large vessel occlusion or significant stenosis in the head and neck. 2. Negative CTP. 3. Advanced cervical disc and facet degeneration. 4.  Aortic Atherosclerosis (ICD10-I70.0). These results were communicated to Dr. Rory Percy at 10:53 am on 11/10/2019 by text page via the [facility] messaging system. Electronically Signed   By: Logan Bores M.D.   On: 11/10/2019 11:05   DG Chest Portable 1 View  Result Date: 11/10/2019 CLINICAL DATA:  Altered  mental status. EXAM: PORTABLE CHEST 1 VIEW COMPARISON:  12/25/2017 FINDINGS: The heart size and mediastinal contours are within normal limits. Both lungs are clear. The visualized skeletal structures are unremarkable. IMPRESSION: Normal exam. Electronically Signed   By: Lorriane Shire M.D.   On: 11/10/2019 13:22   ECHOCARDIOGRAM COMPLETE  Result Date: 11/11/2019    ECHOCARDIOGRAM REPORT   Patient Name:   SHONTELL FONDA Date of Exam: 11/11/2019 Medical Rec #:  [id number]            Height:       63.0 in Accession #:    [id number]           Weight:       127.9 lb Date of Birth:  [birth year]  BSA:          1.599 m Patient Age:    74 years             BP:           165/86 mmHg Patient Gender: F                    HR:           69 bpm. Exam Location:  Inpatient Procedure: 2D Echo, Cardiac Doppler, Color Doppler and 3D Echo Indications:    Stroke  History:        Patient has no prior history of Echocardiogram examinations.                 Stroke; Risk Factors:Hypertension and Dyslipidemia.  Sonographer:    Roseanna Rainbow RDCS Referring Phys: [place]  1. Left ventricular ejection fraction, by estimation, is 60 to 65%. The left ventricle has normal function. The left ventricle has no regional wall motion abnormalities. There is mild concentric left ventricular hypertrophy. Left ventricular diastolic parameters are consistent with Grade I diastolic dysfunction (impaired relaxation).  2. Right ventricular systolic function is normal. The right ventricular size is normal. Tricuspid regurgitation signal is inadequate for assessing PA pressure.  3. The mitral valve is grossly normal. No evidence of mitral valve regurgitation. No evidence of mitral stenosis.  4. The aortic valve is tricuspid. Aortic valve regurgitation is mild. No aortic stenosis is present.  5. The inferior vena cava is normal in size with greater than 50% respiratory variability, suggesting right atrial pressure of 3 mmHg.  FINDINGS  Left Ventricle: Left ventricular ejection fraction, by estimation, is 60 to 65%. The left ventricle has normal function. The left ventricle has no regional wall motion abnormalities. The left ventricular internal cavity size was normal in size. There is  mild concentric left ventricular hypertrophy. Left ventricular diastolic parameters are consistent with Grade I diastolic dysfunction (impaired relaxation). Normal left ventricular filling pressure. Right Ventricle: The right ventricular size is normal. No increase in right ventricular wall thickness. Right ventricular systolic function is normal. Tricuspid regurgitation signal is inadequate for assessing PA pressure. Left Atrium: Left atrial size was normal in size. Right Atrium: Right atrial size was normal in size. Pericardium: Trivial pericardial effusion is present. Presence of pericardial fat pad. Mitral Valve: The mitral valve is grossly normal. No evidence of mitral valve regurgitation. No evidence of mitral valve stenosis. Tricuspid Valve: The tricuspid valve is grossly normal. Tricuspid valve regurgitation is not demonstrated. No evidence of tricuspid stenosis. Aortic Valve: The aortic valve is tricuspid. Aortic valve regurgitation is mild. Aortic regurgitation PHT measures 411 msec. No aortic stenosis is present. Aortic valve mean gradient measures 4.0 mmHg. Aortic valve peak gradient measures 7.3 mmHg. Aortic  valve area, by VTI measures 1.66 cm. Pulmonic Valve: The pulmonic valve was grossly normal. Pulmonic valve regurgitation is not visualized. No evidence of pulmonic stenosis. Aorta: The aortic root and ascending aorta are structurally normal, with no evidence of dilitation. Venous: The inferior vena cava is normal in size with greater than 50% respiratory variability, suggesting right atrial pressure of 3 mmHg. IAS/Shunts: The atrial septum is grossly normal.  LEFT VENTRICLE PLAX 2D LVIDd:         3.20 cm     Diastology LVIDs:          2.10 cm     LV e' lateral:   5.98 cm/s LV PW:  1.20 cm     LV E/e' lateral: 6.2 LV IVS:        1.30 cm     LV e' medial:    4.41 cm/s LVOT diam:     1.70 cm     LV E/e' medial:  8.4 LV SV:         41 LV SV Index:   26 LVOT Area:     2.27 cm  LV Volumes (MOD) LV vol d, MOD A2C: 57.3 ml LV vol d, MOD A4C: 59.1 ml LV vol s, MOD A2C: 16.1 ml LV vol s, MOD A4C: 22.1 ml LV SV MOD A2C:     41.2 ml LV SV MOD A4C:     59.1 ml LV SV MOD BP:      43.9 ml RIGHT VENTRICLE             IVC RV S prime:     13.60 cm/s  IVC diam: 1.00 cm TAPSE (M-mode): 2.1 cm LEFT ATRIUM             Index       RIGHT ATRIUM          Index LA diam:        2.70 cm 1.69 cm/m  RA Area:     9.88 cm LA Vol (A2C):   16.5 ml 10.32 ml/m RA Volume:   20.50 ml 12.82 ml/m LA Vol (A4C):   16.3 ml 10.19 ml/m LA Biplane Vol: 18.0 ml 11.26 ml/m  AORTIC VALVE AV Area (Vmax):    1.48 cm AV Area (Vmean):   1.55 cm AV Area (VTI):     1.66 cm AV Vmax:           135.00 cm/s AV Vmean:          88.700 cm/s AV VTI:            0.246 m AV Peak Grad:      7.3 mmHg AV Mean Grad:      4.0 mmHg LVOT Vmax:         88.30 cm/s LVOT Vmean:        60.500 cm/s LVOT VTI:          0.180 m LVOT/AV VTI ratio: 0.73 AI PHT:            411 msec  AORTA Ao Root diam: 2.60 cm Ao Asc diam:  3.90 cm MITRAL VALVE MV Area (PHT): 3.27 cm    SHUNTS MV Decel Time: 232 msec    Systemic VTI:  0.18 m MV E velocity: 37.20 cm/s  Systemic Diam: 1.70 cm MV A velocity: 79.40 cm/s MV E/A ratio:  0.47 Eleonore Chiquito MD Electronically signed by Eleonore Chiquito MD Signature Date/Time: 11/11/2019/5:18:18 PM    Final    CT HEAD CODE STROKE WO CONTRAST  Result Date: 11/10/2019 CLINICAL DATA:  Code stroke.  Generalized weakness. EXAM: CT HEAD WITHOUT CONTRAST TECHNIQUE: Contiguous axial images were obtained from the base of the skull through the vertex without intravenous contrast. COMPARISON:  Head MRI 02/28/2017 FINDINGS: Brain: There is no evidence of acute large territory infarct, intracranial  hemorrhage, mass, midline shift, or extra-axial fluid collection. Mild cerebral atrophy is within normal limits for age. Patchy hypodensities in the cerebral white matter bilaterally are nonspecific but compatible with mild to moderate chronic small vessel ischemic disease. Asymmetric, more focal hypodensities are present in the left basal ganglia, left internal capsule, and left corona radiata, new from the prior MRI  and compatible with age indeterminate lacunar infarcts. Vascular: Calcified atherosclerosis at the skull base. No hyperdense vessel. Skull: No fracture or suspicious osseous lesion. Sinuses/Orbits: Visualized paranasal sinuses and mastoid air cells are clear. Right cataract extraction is noted. Other: None. ASPECTS [facility] Stroke Program Early CT Score) Not scored due to the lack of localizing symptoms. IMPRESSION: 1. Age indeterminate though possibly recent left basal ganglia region infarcts. 2. No evidence of acute cortically based infarct or intracranial hemorrhage. 3. Mild-to-moderate chronic small vessel ischemic disease. These findings were discussed via telephone with Dr. Rory Percy at 10:18 am on 11/10/2019. Electronically Signed   By: Logan Bores M.D.   On: 11/10/2019 10:22    Assessment/Plan 1. 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 Hypophonia -she is speaking, but very softly and is still having considerable expressive aphasia--nursing notes she only talks to them some of the time and says what she needs  3. Dysphagia following cerebrovascular accident (CVA) -cont to work with [place] -I'm concerned she has aspirated due to her considerable dysphagia (new white blood cell count) -aspiration precautions and pureed diet with nectar thickened liquids, bid medpass, push fluids as able  4. Depression, major, single episode,  moderate ([place]) -admit-ted to depression to me--severity really not clear, but expected post-stroke especially with history of some depression prior--may need zoloft or remeron added   5. Aortic atherosclerosis ([place]) -noted incidentally on imaging -cont lipitor, ASA as planned and plavix  6. Senile purpura (H[facility]) -notable, has some ecchymoses of easily bumped locations on arms and IV sites due to thinning skin with advanced age  6. Essential hypertension -bp at goal with lopressor bid, cont to monitor  8. Pure hypercholesterolemia -cont lipitor -f/u FLP in 4-6 wks  9. Hypokalemia -potassium normalized after in-hospital repletion  10. Urinary retention -cont foley due to retention--suspect more neurogenic, immobility and also related to her prolapse -f/u with urology  11. Foley catheter in place on admission -noted; if xray is unremarkable for aspiration pneumonia, would change catheter and obtain UA c+s due to catheter increasing risk of UTI (in view of leukocytosis)  12. Bandemia -noted as in 11--r/o aspiration first which seems most likely and if negative, check UA c+s  13. Malnutrition of moderate degree -not eating well at all with restricted diet--see how she does and if she does not make good progress overall, might need to consider liberalization for quality of life; is drinking more so continue medpass bid  14. Age-related osteoporosis without current pathological fracture -not currently on treatment, but notable -now has fracture in her talus--will wear shoe due to this when up with therapy  Family/ staff Communication: discussed with SNF nurse   Labs/tests ordered:  For cbc, bmp 5/11 and now I ordered 2 view CXR to r/o aspiration pneumonia--if negative, check UA c+s after cath change  Tylan Kinn L. Dejanique Ruehl, D.O. [facility] [address], [place] [address] Cell Phone (Mon-Fri 8am-5pm):  [phone] On Call:   [phone] & follow prompts after 5pm & weekends Office Phone:  [phone] Office Fax:  [phone]

## 2019-11-27 NOTE — Progress Notes (Deleted)
Phone: [phone]   Subjective:  Patient presents today for their annual physical. Chief complaint-noted.   See problem oriented charting- ROS- full  review of systems was completed and negative except for: ***  The following were reviewed and entered/updated in epic: Past Medical History:  Diagnosis Date  . Anxiety   . Aortic atherosclerosis ([place])   . Arthritis   . Complication of anesthesia    per pt, slow to wake up past sedation.  . CVA (cerebral vascular accident) ([place]) 11/10/2019  . Depression   . Hyperplastic colon polyp   . Hypertension   . Neuropathy   . Osteoarthritis   . Osteoporosis   . Stomach upset    has a senstive stomach  . Throat clearing    has a cough at times  . Urinary incontinence   . Vitamin D deficiency    Patient Active Problem List   Diagnosis Date Noted  . Malnutrition of moderate degree 11/21/2019  . Depression   . Hypokalemia   . Leukocytosis   . CVA (cerebral vascular accident) ([place]) 11/10/2019  . Senile purpura ([place]) 04/27/2019  . Prolapsed uterus 04/27/2019  . Aortic atherosclerosis ([place]) 06/26/2017  . Neuropathy 05/20/2017  . Abnormality of gait due to impairment of balance 05/20/2017  . Incontinence in female 06/25/2014  . Hearing deficit 06/25/2014  . Osteoporosis 06/24/2013  . Hypertension 05/11/2013  . Osteoarthritis 05/11/2013  . Vitamin D deficiency 05/11/2013  . Personal history of colonic polyps 05/11/2013  . Hyperlipidemia 05/11/2013   Past Surgical History:  Procedure Laterality Date  . COLONOSCOPY    . FACIAL COSMETIC SURGERY     over 15 years ago  . FINGER TENDON REPAIR     middle rt hand-  . HAMMER TOE SURGERY  12/27/2011   Procedure: HAMMER TOE CORRECTION;  Surgeon: Wylene Simmer, MD;  Location: [place];  Service: Orthopedics;  Laterality: Left;  2-4th    Family History  Problem Relation Age of Onset  . Arthritis Mother   . Colon cancer Father   . COPD Father 31       died at 80  .  Throat cancer Brother   . Breast cancer Other   . Esophageal cancer Neg Hx   . Rectal cancer Neg Hx   . Stomach cancer Neg Hx   . Stroke Neg Hx     Medications- reviewed and updated Current Outpatient Medications  Medication Sig Dispense Refill  . aspirin 81 MG tablet Take 81 mg by mouth daily.    Marland Kitchen atorvastatin (LIPITOR) 40 MG tablet Take 1 tablet (40 mg total) by mouth daily.    . bisacodyl (DULCOLAX) 10 MG suppository If not relieved by MOM, give 10 mg Bisacodyl suppositiory rectally X 1 dose in 24 hours as needed (Do not use constipation standing orders for residents with renal failure/CFR less than 30. Contact MD for orders) (Physician Order)    . clopidogrel (PLAVIX) 75 MG tablet Take 1 tablet (75 mg total) by mouth daily.    . metoprolol tartrate (LOPRESSOR) 25 MG tablet Take 1 tablet (25 mg total) by mouth 2 (two) times daily.    . NON FORMULARY DIET:Puree with nectar thick liquids     No current facility-administered medications for this visit.    Allergies-reviewed and updated Allergies  Allergen Reactions  . Chlorhexidine     Social History   Social History Narrative   Divorced. Children: 1 son. Patient supports her mother- mother lives with her- 2 story  home.       Retired from [employer]  Education: college.       Hobbies: riding horses in the past, family time, enjoys some gardening   Objective  Objective:  LMP 07/23/1998 (Approximate)  Gen: NAD, resting comfortably HEENT: Mucous membranes are moist. Oropharynx normal Neck: no thyromegaly CV: RRR no murmurs rubs or gallops Lungs: CTAB no crackles, wheeze, rhonchi Abdomen: soft/nontender/nondistended/normal bowel sounds. No rebound or guarding.  Ext: no edema Skin: warm, dry Neuro: grossly normal, moves all extremities, PERRLA***   Assessment and Plan   74 y.o. female presenting for annual physical.  Health Maintenance counseling: 1. Anticipatory guidance: Patient counseled regarding  regular dental exams ***q6 months, eye exams ***,  avoiding smoking and second hand smoke*** , limiting alcohol to 1 beverage per day*** .   2. Risk factor reduction:  Advised patient of need for regular exercise and diet rich and fruits and vegetables to reduce risk of heart attack and stroke. Exercise- ***. Diet-***.  Wt Readings from Last 3 Encounters:  11/27/19 117 lb 12.8 oz (53.4 kg)  11/25/19 117 lb 12.8 oz (53.4 kg)  11/10/19 127 lb 13.9 oz (58 kg)   3. Immunizations/screenings/ancillary studies Immunization History  Administered Date(s) Administered  . Fluad Quad(high Dose 65+) 04/27/2019  . Influenza, High Dose Seasonal PF 05/03/2017, 05/19/2018  . Influenza,inj,Quad PF,6+ Mos 04/29/2013, 05/18/2014, 05/02/2015, 04/17/2016  . PFIZER SARS-COV-2 Vaccination 08/27/2019, 09/21/2019  . Pneumococcal Conjugate-13 05/11/2013  . Pneumococcal Polysaccharide-23 05/02/2015   Health Maintenance Due  Topic Date Due  . MAMMOGRAM  01/09/2019   4. Cervical cancer screening- *** Followed by GYN last done  5. Breast cancer screening-  breast exam *** and mammogram *** 6. Colon cancer screening - *** up to date next due 03/11/2024 7. Skin cancer screening- ***advised regular sunscreen use. Denies worrisome, changing, or new skin lesions.  8. Birth control/STD check- *** 9. Osteoporosis screening at 87- ***  Will be due 12/26/2019 -*** smoker  Status of chronic or acute concerns  #hypertension S: medication: *** Metoprolol 25mg  bid  Home readings #s: *** BP Readings from Last 3 Encounters:  11/27/19 131/86  11/25/19 114/78  11/24/19 (!) 162/93  A/P: ***  #hyperlipidemia S: Medication:*** Atorvastatin 40mg   Lab Results  Component Value Date   CHOL 196 11/11/2019   HDL 65 11/11/2019   LDLCALC 115 (H) 11/11/2019   LDLDIRECT 116.0 04/27/2019   TRIG 78 11/11/2019   CHOLHDL 3.0 11/11/2019   A/P: ***  # Vitamin D deficiency/ osteoporosis S:*** currently taking Vitamin D 1000 IU daily.    A/P: ***    No specialty comments available. *** No diagnosis found.  Recommended follow up: ***No follow-ups on file. Future Appointments  Date Time Provider [place]  11/27/2019  1:20 PM Marin Olp, MD [facility]  12/07/2019  2:30 PM Cameron Sprang, MD [facility] None    No chief complaint on file.  Lab/Order associations:*** fasting No diagnosis found.  No orders of the defined types were placed in this encounter.   Return precautions advised.  Francella Solian, CMA

## 2019-12-01 ENCOUNTER — Ambulatory Visit (INDEPENDENT_AMBULATORY_CARE_PROVIDER_SITE_OTHER): Payer: Medicare Other

## 2019-12-01 DIAGNOSIS — I4891 Unspecified atrial fibrillation: Secondary | ICD-10-CM | POA: Diagnosis not present

## 2019-12-01 DIAGNOSIS — I639 Cerebral infarction, unspecified: Secondary | ICD-10-CM | POA: Diagnosis not present

## 2019-12-07 ENCOUNTER — Other Ambulatory Visit: Payer: Self-pay

## 2019-12-07 ENCOUNTER — Non-Acute Institutional Stay (SKILLED_NURSING_FACILITY): Payer: Medicare Other | Admitting: Internal Medicine

## 2019-12-07 ENCOUNTER — Ambulatory Visit: Payer: Medicare Other | Admitting: Neurology

## 2019-12-07 ENCOUNTER — Telehealth: Payer: Self-pay | Admitting: Neurology

## 2019-12-07 ENCOUNTER — Encounter: Payer: Self-pay | Admitting: Internal Medicine

## 2019-12-07 ENCOUNTER — Encounter: Payer: Self-pay | Admitting: Neurology

## 2019-12-07 VITALS — BP 120/84 | HR 87 | Resp 20

## 2019-12-07 DIAGNOSIS — D72825 Bandemia: Secondary | ICD-10-CM | POA: Diagnosis not present

## 2019-12-07 DIAGNOSIS — G629 Polyneuropathy, unspecified: Secondary | ICD-10-CM

## 2019-12-07 DIAGNOSIS — E876 Hypokalemia: Secondary | ICD-10-CM | POA: Diagnosis not present

## 2019-12-07 DIAGNOSIS — R21 Rash and other nonspecific skin eruption: Secondary | ICD-10-CM | POA: Diagnosis not present

## 2019-12-07 DIAGNOSIS — I69354 Hemiplegia and hemiparesis following cerebral infarction affecting left non-dominant side: Secondary | ICD-10-CM | POA: Diagnosis not present

## 2019-12-07 DIAGNOSIS — I63313 Cerebral infarction due to thrombosis of bilateral middle cerebral arteries: Secondary | ICD-10-CM

## 2019-12-07 DIAGNOSIS — I6359 Cerebral infarction due to unspecified occlusion or stenosis of other cerebral artery: Secondary | ICD-10-CM

## 2019-12-07 NOTE — Patient Instructions (Signed)
Great to see you. Continue with PT, OT, speech therapy. Continue aspirin and Plavix for 3 months post-stroke, then Plavix monotherapy. 30-day holter if not done. Follow-up in 6 months, call for any changes

## 2019-12-07 NOTE — Progress Notes (Signed)
NEUROLOGY FOLLOW UP OFFICE NOTE  Lynn [name] NE:[id number] 25-Sep-1945  HISTORY OF PRESENT ILLNESS: I had the pleasure of seeing Lynn [name] in follow-up in the neurology clinic on 12/07/2019.  The patient was last seen in 74 for gait imbalance and dizziness. She presents today in a stretcher, with her sister present to provide additional information. Records were reviewed. Unfortunately she had bilateral strokes last month. She was brought to the ER on 4/20 for "slow thinking" and expressive aphasia. She was noted to have left-sided weakness and expressive aphasia in the ER. Family had noticed she had slowed down in the past few years since moving in to her mother's home, in the past 6 months she had not cared for her appearance and was walking with a cane due to gait unsteadiness. I personally reviewed MRI brain without contrast which showed patchy acute infarcts involving the basal ganglia/corona radiata bilaterally, left greater than right. There was mildly age advanced chronic microvascular disease. CTA head and neck no significant stenosis. LDL elevated, HbA1c 5.1. During her hospitalization, she was noted to have mild dysarthria and psychomotor slowing, left UE 0/5, LLE 3/5 proximally, 4/5 distally, 4/5 right UE and LE with right foot drop. Etiology of stroke unclear, possibly small vessel disease, however given bilateral involvement, 30-day cardiac monitor was recommended. She was discharged on DAPT for 3 months, then Plavix alone. She had an episode of decreased responsiveness felt due to toxic/metabolic/infectious cause, WBC was 20.6. Outpatient cognitive testing was recommended, however unable to perform today due to her mental status. Patient is drowsy, arousable to say "hi," recognizing the examiner from our prior visits, able to follow simple commands on the right arm. Flaccid left, unable to move both LE much.   HPI 05/09/2017: This is a pleasant 74 yo RH woman with a history  of hypertension, who presented for evaluation of gait unsteadiness. She had been seeing Neurosurgery for back pain. She reported symptoms started a couple of years ago, but worsened in the past 6-8 months. She had fractured her left foot and was doing PT, noting difficulties with heel-toe walking, worse when she closes her eyes. She has come very close to falling, she was drying her hair bending forward and pitched forward. She has numbness and tingling in both hands and feet up to the mid-calf region. She has occasional sharp pains in both feet. Over the past couple of years, she has noticed clumsiness in both hands, worse the past year. If she does much lifting, she starts having back pain. She denies any neck pain except when bending head forward too much. She has some stress incontinence, no bowel dysfunction. She has occasional headaches associated with eye issues. She has a little blurred vision with dry eye. Over the past 1.5 years, she has noticed a change in her speech, her voice has thickened and she is more hesitant when talking, worse this past year. She has also noticed some anxiety worsens it as well. She occasionally chokes on her food. She denies any family history of similar symptoms. She had an MRI brain without contrast at [facility] last 02/28/17, images unavailable for review, there were no acute changes. There was mild FLAIR white matter changes, normal brain volume.   PAST MEDICAL HISTORY: Past Medical History:  Diagnosis Date  . Anxiety   . Aortic atherosclerosis ([place])   . Arthritis   . Complication of anesthesia    per pt, slow to wake up past sedation.  . CVA (cerebral  vascular accident) ([place]) 11/10/2019  . Depression   . Hyperplastic colon polyp   . Hypertension   . Neuropathy   . Osteoarthritis   . Osteoporosis   . Stomach upset    has a senstive stomach  . Throat clearing    has a cough at times  . Urinary incontinence   . Vitamin D deficiency      MEDICATIONS: Current Outpatient Medications on File Prior to Visit  Medication Sig Dispense Refill  . aspirin 81 MG tablet Take 81 mg by mouth daily.    Marland Kitchen atorvastatin (LIPITOR) 40 MG tablet Take 1 tablet (40 mg total) by mouth daily.    . bisacodyl (DULCOLAX) 10 MG suppository If not relieved by MOM, give 10 mg Bisacodyl suppositiory rectally X 1 dose in 24 hours as needed (Do not use constipation standing orders for residents with renal failure/CFR less than 30. Contact MD for orders) (Physician Order)    . clopidogrel (PLAVIX) 75 MG tablet Take 1 tablet (75 mg total) by mouth daily.    . metoprolol tartrate (LOPRESSOR) 25 MG tablet Take 1 tablet (25 mg total) by mouth 2 (two) times daily.    . NON FORMULARY Diet: Puree with Thin Liquids     No current facility-administered medications on file prior to visit.    ALLERGIES: Allergies  Allergen Reactions  . Chlorhexidine     FAMILY HISTORY: Family History  Problem Relation Age of Onset  . Arthritis Mother   . Colon cancer Father   . COPD Father 23       died at 12  . Throat cancer Brother   . Breast cancer Other   . Esophageal cancer Neg Hx   . Rectal cancer Neg Hx   . Stomach cancer Neg Hx   . Stroke Neg Hx     SOCIAL HISTORY: Social History   Socioeconomic History  . Marital status: Divorced    Spouse name: Not on file  . Number of children: 1  . Years of education: 37  . Highest education level: Not on file  Occupational History  . Occupation: retired  Tobacco Use  . Smoking status: Former Smoker    Years: 10.00    Quit date: 12/21/1975    Years since quitting: 43.9  . Smokeless tobacco: Never Used  Substance and Sexual Activity  . Alcohol use: Yes    Comment: rarely  . Drug use: No  . Sexual activity: Not Currently  Other Topics Concern  . Not on file  Social History Narrative   Divorced. Children: 1 son. Patient supports her mother- mother lives with her- 2 story home.       Retired from [employer]  [employer]  Education: college.       Hobbies: riding horses in the past, family time, enjoys some gardening   Social Determinants of Health   Financial Resource Strain:   . Difficulty of Paying Living Expenses:   Food Insecurity:   . Worried About Charity fundraiser in the Last Year:   . Arboriculturist in the Last Year:   Transportation Needs:   . Film/video editor (Medical):   Marland Kitchen Lack of Transportation (Non-Medical):   Physical Activity:   . Days of Exercise per Week:   . Minutes of Exercise per Session:   Stress:   . Feeling of Stress :   Social Connections:   . Frequency of Communication with Friends and Family:   . Frequency of Social  Gatherings with Friends and Family:   . Attends Religious Services:   . Active Member of Clubs or Organizations:   . Attends Archivist Meetings:   Marland Kitchen Marital Status:   Intimate Partner Violence:   . Fear of Current or Ex-Partner:   . Emotionally Abused:   Marland Kitchen Physically Abused:   . Sexually Abused:     PHYSICAL EXAM: Vitals:   12/07/19 1504  BP: 120/84  Pulse: 87  Resp: 20  SpO2: 100%   General: No acute distress, lying on stretcher, drowsy but easily arousable Head:  Normocephalic/atraumatic Neurological Exam: alert and oriented to person. +hypophonia. Able to answer simple questions and follow commands on the right UE. Unable to do formal memory testing today. Cranial nerves: Pupils equal, round, reactive to light. Extraocular movements intact with no nystagmus. Blink to threat bilaterally. Shallow left nasolabial fold. Motor: flaccid left UE, at least 4/5 right UE. Unable to move both legs today while in stretcher. Deep tendon reflexes +2 on left UE, +1 right UE, unable to elicit on both LE. Finger to nose testing intact on right. Gait not tested.   IMPRESSION: This is a pleasant 74 yo RH woman with a history of hypertension, initially seen for gait unsteadiness felt due to neuropathy. She presents today with  new symptoms after she was found to have bilateral basal ganglia strokes L>R last 11/10/19. Family had reported cognitive changes at least 6 months prior to the stroke. Etiology of stroke unclear, she is flaccid today on the left side, hypophonic, flat affect. Felt possibly due to small vessel disease, however due to bilateral involvement, proceed with 30-day holter monitor. Continue DAPT for 3 months, then Plavix alone. Outpatient cognitive testing recommended, however unable to do today due to mental status (drowsy, easily arousable). We discussed post-stroke care, continue control of vascular risk factors, continue PT, OT, speech therapy. She needs assistance with all ADLs at this time. Re-evaluate in 3-4 months, call for any changes.    Thank you for allowing me to participate in her care.  Please do not hesitate to call for any questions or concerns.   Ellouise Newer, M.D.   CC: Dr. Yong Channel, Dr. Laurance Flatten, Dr. Mariea Clonts

## 2019-12-07 NOTE — Progress Notes (Signed)
Location:    [place] Room Number: 107/P Place of Service:  SNF [phone]) Provider:  Ileene Hutchinson, MD  Patient Care Team: Marin Olp, MD as PCP - General (Family Medicine) Janne Lab, MD (Obstetrics and Gynecology) Wylene Simmer, MD as Consulting Physician (Orthopedic Surgery) Cameron Sprang, MD as Consulting Physician (Neurology)  Extended Emergency Contact Information Primary Emergency Contact: [facility] Address: [address]          [address]          [place], [place] [address] [place] Phone: [phone] Mobile Phone: [phone] Relation: Mother Secondary Emergency Contact: yeili, [place]ham Mobile Phone: [phone] Relation: Brother  Code Status:  Full Code Goals of care: Advanced Directive information Advanced Directives 12/07/2019  Does Patient Have a Medical Advance Directive? Yes  Type of Advance Directive (No Data)  Does patient want to make changes to medical advance directive? No - Patient declined  Would patient like information on creating a medical advance directive? -     Chief Complaint  Patient presents with  . Acute Visit    Rash    HPI:  Pt is a 74 y.o. female seen today for an acute visit for rash on her back.  Nursing feels this is most likely more of a contact-heat type rash.  She does not really complain of any discomfort with this.  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  She was started on a statin and also put on Plavix in addition to aspirin for a planned 36-month course after which she will take Plavix alone.  She also has a cardiac event monitor to rule out A. fib secondary to unusual bilateral involvement.  She does have follow-up with neurology.  She also had leukocytosis in the hospital-this appears to be somewhat chronic she does not exhibit fever chills or dysuria or increased cough or congestion  will recheck a CBC.  Currently she is lying in bed comfortably she is quite pleasant and cooperative does have hypophonia and aphasia but is pleasant and cooperative with exam and is able to communicate     Past Medical History:  Diagnosis Date  . Anxiety   . Aortic atherosclerosis ([place])   . Arthritis   . Complication of anesthesia    per pt, slow to wake up past sedation.  . CVA (cerebral vascular accident) ([place]) 11/10/2019  . Depression   . Hyperplastic colon polyp   . Hypertension   . Neuropathy   . Osteoarthritis   . Osteoporosis   . Stomach upset    has a senstive stomach  . Throat clearing    has a cough at times  . Urinary incontinence   . Vitamin D deficiency    Past Surgical History:  Procedure Laterality Date  . COLONOSCOPY    . FACIAL COSMETIC SURGERY     over 15 years ago  . FINGER TENDON REPAIR     middle rt hand-  . HAMMER TOE SURGERY  12/27/2011   Procedure: HAMMER TOE CORRECTION;  Surgeon: Wylene Simmer, MD;  Location: [facility];  Service: Orthopedics;  Laterality: Left;  2-4th    Allergies  Allergen Reactions  . Chlorhexidine     Outpatient Encounter Medications as of 12/07/2019  Medication Sig  . aspirin 81 MG tablet Take 81 mg by mouth daily.  Marland Kitchen atorvastatin (LIPITOR) 40 MG tablet Take 1 tablet (40  mg total) by mouth daily.  . bisacodyl (BISACODYL) 5 MG EC tablet Take 5 mg by mouth daily as needed for mild constipation or moderate constipation.  . bisacodyl (DULCOLAX) 10 MG suppository If not relieved by MOM, give 10 mg Bisacodyl suppositiory rectally X 1 dose in 24 hours as needed (Do not use constipation standing orders for residents with renal failure/CFR less than 30. Contact MD for orders) (Physician Order)  . clopidogrel (PLAVIX) 75 MG tablet Take 1 tablet (75 mg total) by mouth daily.  . magnesium hydroxide (MILK OF MAGNESIA) 400 MG/5ML suspension If no BM in 3 days, give 30 cc Milk of Magnesium p.o. x 1 dose in 24 hours as  needed (Do not use standing constipation orders for residents with renal failure CFR less than 30. Contact MD for orders) (Physician Order)  . metoprolol tartrate (LOPRESSOR) 25 MG tablet Take 1 tablet (25 mg total) by mouth 2 (two) times daily.  . NON FORMULARY Diet: Puree with Thin Liquids  . NON FORMULARY 4oz Medpass d/t Dx Malnutrition/poor intake.  . Sodium Phosphates (RA SALINE ENEMA RE) If not relieved by Biscodyl suppository, give disposable Saline Enema rectally X 1 dose/24 hrs as needed (Do not use constipation standing orders for residents with renal failure/CFR less than 30. Contact MD for orders)(Physician Or   No facility-administered encounter medications on file as of 12/07/2019.    Review of Systems  General she is not complaining of fever chills continues to have somewhat of a poor appetite apparently is taking fluids.  Skin-does have a history of ecchymosis status also a back rash  Head ears eyes nose mouth and throat again is positive for hypophonia and some hearing loss.  Eyes no complaints of visual disturbances.  Respiratory does not complain of a cough or shortness of breath.  Cardiac does not complain of any chest pain or increasing edema.  GI is not complaining of abdominal discomfort nausea vomiting diarrhea constipation.  GU does have an indwelling Foley catheter with a history of ureteral vaginal prolapse.  Musculoskeletal is positive for left-sided weakness. -  Neurologic again is status post CVA with some speech deficits and weakness on the left side.  Psych she appears to be appropriate does not really complain being anxious apparently there is been some confusion at times.-There may be an element of depression     Immunization History  Administered Date(s) Administered  . Fluad Quad(high Dose 65+) 04/27/2019  . Influenza, High Dose Seasonal PF 05/03/2017, 05/19/2018  . Influenza,inj,Quad PF,6+ Mos 04/29/2013, 05/18/2014, 05/02/2015, 04/17/2016   . PFIZER SARS-COV-2 Vaccination 08/27/2019, 09/21/2019  . Pneumococcal Conjugate-13 05/11/2013  . Pneumococcal Polysaccharide-23 05/02/2015   Pertinent  Health Maintenance Due  Topic Date Due  . MAMMOGRAM  01/09/2019  . DEXA SCAN  12/26/2019  . INFLUENZA VACCINE  02/21/2020  . COLONOSCOPY  03/11/2024  . PNA vac Low Risk Adult  Completed   Fall Risk  04/27/2019 08/05/2018 06/17/2018 12/25/2017 11/22/2017  Falls in the past year? 1 0 0 No No  Number falls in past yr: 0 - 0 - -  Injury with Fall? 1 - 0 - -   Functional Status Survey:    Vitals:   12/07/19 1432  BP: 135/79  Pulse: 74  Resp: 18  Temp: 98 F (36.7 C)  TempSrc: Oral  SpO2: 96%  Weight: 116 lb (52.6 kg)  Height: 5\' 3"  (1.6 m)   Body mass index is 20.55 kg/m. Physical Exam  In general this is a  pleasant elderly female who is not in any distress she does not speak a whole lot but does speak some.  Her skin is warm and dry on her back there is somewhat of a pale erythematous rash I do not see any drainage bleeding or sign of cellulitis-this appears to be more heat related.  Eyes visual acuity appears to be intact sclera conjunctive are clear.  Oropharynx is clear mucous membranes are slightly dry.  Chest is clear to auscultation cannot really appreciate labored breathing.  Air entry is somewhat shallow.  Heart is regular rate and rhythm without murmur gallop or rub she has trace lower extremity edema.  Abdomen is soft nontender with positive bowel sounds.  GU has an indwelling Foley catheter draining amber-colored urine.  Musculoskeletal is positive for left-sided hemiplegia she also has hypophonia and some aphasia.  Psych she appears grossly alert and oriented follows simple verbal commands without difficulty is able to communicate some and appears appropriate   Labs reviewed: Recent Labs    11/18/19 0343 11/19/19 0240 11/21/19 0308 11/21/19 0308 11/22/19 0502 11/23/19 0356 11/25/19 0000  NA 139    < > 147*   < > 144 141 145  K 4.4   < > 3.6   < > 3.2* 3.8 4.0  CL 108   < > 112*   < > 112* 110 107  CO2 12*   < > 27   < > 25 23 22   GLUCOSE 123*   < > 110*  --  113* 102*  --   BUN 21   < > 29*   < > 25* 17 32*  CREATININE 0.62   < > 0.65   < > 0.60 0.56 0.5  CALCIUM 9.1   < > 9.2   < > 8.8* 8.8* 9.4  MG 2.0  --  2.2  --   --  1.8  --   PHOS  --   --  2.8  --   --   --   --    < > = values in this interval not displayed.   Recent Labs    11/20/19 0332 11/21/19 0308 11/22/19 0502  AST 30 26 22   ALT 66* 51* 37  ALKPHOS 80 77 66  BILITOT 0.6 0.5 0.4  PROT 5.7* 5.7* 5.0*  ALBUMIN 2.3* 2.4* 2.1*   Recent Labs    11/21/19 0308 11/21/19 0308 11/22/19 0502 11/23/19 0356 11/25/19 0000  WBC 12.9*   < > 13.9* 13.0* 13.3  NEUTROABS 10.2*  --  11.1* 10.5*  --   HGB 14.0   < > 12.4 13.0 13.3  HCT 44.6   < > 40.0 40.2 41  MCV 98.0  --  98.8 96.6  --   PLT 442*   < > 411* 431* 455*   < > = values in this interval not displayed.   Lab Results  Component Value Date   TSH 1.728 11/10/2019   Lab Results  Component Value Date   HGBA1C 5.1 11/11/2019   Lab Results  Component Value Date   CHOL 196 11/11/2019   HDL 65 11/11/2019   LDLCALC 115 (H) 11/11/2019   LDLDIRECT 116.0 04/27/2019   TRIG 78 11/11/2019   CHOLHDL 3.0 11/11/2019    Significant Diagnostic Results in last 30 days:  CT Code Stroke CTA Head W/WO contrast  Result Date: 11/14/2019 CLINICAL DATA:  74 year old female with history of recent strokes, mental status change, generalized weakness. EXAM: CT ANGIOGRAPHY HEAD AND  NECK CT PERFUSION BRAIN TECHNIQUE: Multidetector CT imaging of the head and neck was performed using the standard protocol during bolus administration of intravenous contrast. Multiplanar CT image reconstructions and MIPs were obtained to evaluate the vascular anatomy. Carotid stenosis measurements (when applicable) are obtained utilizing NASCET criteria, using the distal internal carotid diameter  as the denominator. Multiphase CT imaging of the brain was performed following IV bolus contrast injection. Subsequent parametric perfusion maps were calculated using RAPID software. CONTRAST:  165mL OMNIPAQUE IOHEXOL 350 MG/ML SOLN COMPARISON:  Recent MRI and CTA from 11/10/2019. FINDINGS: CT HEAD FINDINGS Brain: Age-related cerebral atrophy with mild chronic small vessel ischemic disease. Evolving subacute bilateral basal ganglia infarcts again noted, left greater than right, overall relatively stable in size and distribution. No evidence for hemorrhagic transformation or other complication. No other acute intracranial hemorrhage. No new large vessel territory infarct. No mass lesion or midline shift. No hydrocephalus or extra-axial fluid collection. Vascular: No hyperdense vessel. Skull: Scalp soft tissues and calvarium within normal limits. Sinuses/Orbits: Globes orbital soft tissues within normal limits. Patient is status post cataract extraction on the right. Paranasal sinuses remain clear. No mastoid effusion. Other: None. ASPECTS ([place] Stroke Program Early CT Score) - Ganglionic level infarction (caudate, lentiform nuclei, internal capsule, insula, M1-M3 cortex): 7 - Supraganglionic infarction (M4-M6 cortex): 3 Total score (0-10 with 10 being normal): 10 Review of the MIP images confirms the above findings CTA NECK FINDINGS Aortic arch: Visualized aortic arch of normal caliber with normal 3 vessel morphology. Scattered atheromatous change about the arch and origin of the great vessels without hemodynamically significant stenosis. Visualized subclavian arteries remain patent. Right carotid system: Right common and internal carotid arteries widely patent without stenosis, dissection, or occlusion. Left carotid system: Left common and internal carotid arteries widely patent without stenosis, dissection or occlusion. Vertebral arteries: Both vertebral arteries arise from the subclavian arteries. Vertebral  arteries remain patent without stenosis, dissection, or occlusion. Skeleton: No new osseous abnormality. Advanced cervical spondylosis at C5-6 and C6-7 again noted. Other neck: No other new soft tissue abnormality within the neck. Upper chest: Mild scattered atelectatic changes noted within the visualized lungs. Visualized upper chest demonstrates no other acute finding. Review of the MIP images confirms the above findings CTA HEAD FINDINGS Anterior circulation: Internal carotid arteries remain patent to the termini. Mild atheromatous change within the cavernous/supraclinoid ICAs without significant stenosis. A1 segments patent bilaterally. Hypoplastic right A1 noted, accounting for the slightly diminutive right ICA. Normal anterior communicating artery. Anterior cerebral arteries widely patent to their distal aspects. No M1 stenosis or occlusion. Normal MCA bifurcations. Distal MCA branches remain well perfused. Posterior circulation: Both vertebral arteries widely patent to the vertebrobasilar junction. Both picas remain patent. Basilar patent to its distal aspect without stenosis. Superior cerebral arteries patent bilaterally. Both PCAs primarily supplied via the basilar well perfused to their distal aspects. Venous sinuses: Grossly patent allowing for timing the contrast bolus. Anatomic variants: Hypoplastic right A1.  No intracranial aneurysm. Review of the MIP images confirms the above findings CT Brain Perfusion Findings: ASPECTS: 10 CBF (<30%) Volume: 9mL Perfusion (Tmax>6.0s) volume: 20mL Mismatch Volume: 102mL Infarction Location:Negative CT perfusion for acute core infarct. No other perfusion deficit. IMPRESSION: CT HEAD IMPRESSION: 1. Subacute evolving bilateral basal ganglia infarcts, left greater than right, overall stable in size and distribution as compared to previous. No evidence for hemorrhagic transformation or other complication. 2. No other new acute intracranial abnormality. CTA HEAD AND NECK  IMPRESSION: 1. Stable CTA of the head  and neck. No large vessel occlusion. No new stenosis, dissection, or other acute finding. 2.  Aortic Atherosclerosis (ICD10-I70.0). CT PERFUSION IMPRESSION: Negative CT perfusion. No evidence for acute core infarct or other perfusion deficit. Critical Value/emergent results were called by telephone at the time of interpretation on 11/14/2019 at 11:30 am to provider ERIC [facility] , who verbally acknowledged these results. Electronically Signed   By: Jeannine Boga M.D.   On: 11/14/2019 00:43   CT Code Stroke CTA Head W/WO contrast  Result Date: 11/10/2019 CLINICAL DATA:  Generalized weakness.  Encephalopathy. EXAM: CT ANGIOGRAPHY HEAD AND NECK CT PERFUSION BRAIN TECHNIQUE: Multidetector CT imaging of the head and neck was performed using the standard protocol during bolus administration of intravenous contrast. Multiplanar CT image reconstructions and MIPs were obtained to evaluate the vascular anatomy. Carotid stenosis measurements (when applicable) are obtained utilizing NASCET criteria, using the distal internal carotid diameter as the denominator. Multiphase CT imaging of the brain was performed following IV bolus contrast injection. Subsequent parametric perfusion maps were calculated using RAPID software. CONTRAST:  21mL OMNIPAQUE IOHEXOL 350 MG/ML SOLN COMPARISON:  None. FINDINGS: CTA NECK FINDINGS Aortic arch: Standard 3 vessel aortic arch with minimal atherosclerotic plaque. Widely patent arch vessel origins. Right carotid system: Patent without evidence of stenosis, dissection, or significant atherosclerosis. Left carotid system: Patent without evidence of stenosis, dissection, or significant atherosclerosis. Vertebral arteries: Patent and codominant without evidence of stenosis, dissection, or significant atherosclerosis. Skeleton: Severe facet arthrosis at C3-4 and C4-5 with grade 1 anterolisthesis at both levels. Severe disc degeneration at C5-6 and C6-7.  Severe multilevel neural foraminal stenosis in the cervical spine. Other neck: No evidence of cervical lymphadenopathy or mass. Upper chest: No apical lung consolidation or mass. Review of the MIP images confirms the above findings CTA HEAD FINDINGS Anterior circulation: The internal carotid arteries are patent from skull base to carotid termini with minimal nonstenotic plaque bilaterally. ACAs and MCAs are patent without evidence of proximal branch occlusion or significant proximal stenosis. No aneurysm is identified. Posterior circulation: The intracranial vertebral arteries are widely patent to the basilar. Patent PICA, AICA, and SCA origins are identified bilaterally. Posterior communicating arteries are not clearly identified and may be diminutive or absent. The PCAs are patent without evidence of significant proximal stenosis. No aneurysm is identified. Venous sinuses: As permitted by contrast timing, patent. Anatomic variants: None. Review of the MIP images confirms the above findings CT Brain Perfusion Findings: CBF (<30%) Volume: 0 mL Perfusion (Tmax>6.0s) volume: 0 mL Mismatch Volume: n/a Infarction Location: None evident by CTP. IMPRESSION: 1. No large vessel occlusion or significant stenosis in the head and neck. 2. Negative CTP. 3. Advanced cervical disc and facet degeneration. 4.  Aortic Atherosclerosis (ICD10-I70.0). These results were communicated to Dr. Rory Percy at 10:53 am on 11/10/2019 by text page via the [facility] messaging system. Electronically Signed   By: Logan Bores M.D.   On: 11/10/2019 11:05   DG Ankle 2 Views Right  Result Date: 11/18/2019 CLINICAL DATA:  Pain and bruising EXAM: RIGHT ANKLE - 2 VIEW COMPARISON:  None. FINDINGS: Frontal and lateral views obtained. There is mild soft tissue swelling. There is a small focal area of calcification along the lateral talus, concerning for avulsion injury. Evidence of old injury with well corticated area along the lateral malleolus. No joint  effusion. Ankle mortise appears intact. No appreciable joint space narrowing. Inferior calcaneal spur noted. IMPRESSION: Soft tissue swelling. Suspect small avulsion arising from the medial talus. Evidence of old injury  with remodeling lateral malleolus. Ankle mortise appears intact. Small inferior calcaneal spur noted. These results will be called to the ordering clinician or representative by the Radiologist Assistant, and communication documented in the PACS or [employer]. Electronically Signed   By: Lowella Grip III M.D.   On: 11/18/2019 11:38   DG Abd 1 View  Result Date: 11/18/2019 CLINICAL DATA:  Abdominal distension. EXAM: ABDOMEN - 1 VIEW COMPARISON:  None. FINDINGS: The bowel gas pattern is normal. Residual contrast is noted in the colon and rectum. No radio-opaque calculi or other significant radiographic abnormality are seen. IMPRESSION: No evidence of bowel obstruction or ileus. Electronically Signed   By: Marijo Conception M.D.   On: 11/18/2019 11:36   CT Code Stroke CTA Neck W/WO contrast  Result Date: 11/14/2019 CLINICAL DATA:  74 year old female with history of recent strokes, mental status change, generalized weakness. EXAM: CT ANGIOGRAPHY HEAD AND NECK CT PERFUSION BRAIN TECHNIQUE: Multidetector CT imaging of the head and neck was performed using the standard protocol during bolus administration of intravenous contrast. Multiplanar CT image reconstructions and MIPs were obtained to evaluate the vascular anatomy. Carotid stenosis measurements (when applicable) are obtained utilizing NASCET criteria, using the distal internal carotid diameter as the denominator. Multiphase CT imaging of the brain was performed following IV bolus contrast injection. Subsequent parametric perfusion maps were calculated using RAPID software. CONTRAST:  151mL OMNIPAQUE IOHEXOL 350 MG/ML SOLN COMPARISON:  Recent MRI and CTA from 11/10/2019. FINDINGS: CT HEAD FINDINGS Brain: Age-related cerebral atrophy  with mild chronic small vessel ischemic disease. Evolving subacute bilateral basal ganglia infarcts again noted, left greater than right, overall relatively stable in size and distribution. No evidence for hemorrhagic transformation or other complication. No other acute intracranial hemorrhage. No new large vessel territory infarct. No mass lesion or midline shift. No hydrocephalus or extra-axial fluid collection. Vascular: No hyperdense vessel. Skull: Scalp soft tissues and calvarium within normal limits. Sinuses/Orbits: Globes orbital soft tissues within normal limits. Patient is status post cataract extraction on the right. Paranasal sinuses remain clear. No mastoid effusion. Other: None. ASPECTS ([place] Stroke Program Early CT Score) - Ganglionic level infarction (caudate, lentiform nuclei, internal capsule, insula, M1-M3 cortex): 7 - Supraganglionic infarction (M4-M6 cortex): 3 Total score (0-10 with 10 being normal): 10 Review of the MIP images confirms the above findings CTA NECK FINDINGS Aortic arch: Visualized aortic arch of normal caliber with normal 3 vessel morphology. Scattered atheromatous change about the arch and origin of the great vessels without hemodynamically significant stenosis. Visualized subclavian arteries remain patent. Right carotid system: Right common and internal carotid arteries widely patent without stenosis, dissection, or occlusion. Left carotid system: Left common and internal carotid arteries widely patent without stenosis, dissection or occlusion. Vertebral arteries: Both vertebral arteries arise from the subclavian arteries. Vertebral arteries remain patent without stenosis, dissection, or occlusion. Skeleton: No new osseous abnormality. Advanced cervical spondylosis at C5-6 and C6-7 again noted. Other neck: No other new soft tissue abnormality within the neck. Upper chest: Mild scattered atelectatic changes noted within the visualized lungs. Visualized upper chest  demonstrates no other acute finding. Review of the MIP images confirms the above findings CTA HEAD FINDINGS Anterior circulation: Internal carotid arteries remain patent to the termini. Mild atheromatous change within the cavernous/supraclinoid ICAs without significant stenosis. A1 segments patent bilaterally. Hypoplastic right A1 noted, accounting for the slightly diminutive right ICA. Normal anterior communicating artery. Anterior cerebral arteries widely patent to their distal aspects. No M1 stenosis or occlusion. Normal MCA  bifurcations. Distal MCA branches remain well perfused. Posterior circulation: Both vertebral arteries widely patent to the vertebrobasilar junction. Both picas remain patent. Basilar patent to its distal aspect without stenosis. Superior cerebral arteries patent bilaterally. Both PCAs primarily supplied via the basilar well perfused to their distal aspects. Venous sinuses: Grossly patent allowing for timing the contrast bolus. Anatomic variants: Hypoplastic right A1.  No intracranial aneurysm. Review of the MIP images confirms the above findings CT Brain Perfusion Findings: ASPECTS: 10 CBF (<30%) Volume: 44mL Perfusion (Tmax>6.0s) volume: 68mL Mismatch Volume: 70mL Infarction Location:Negative CT perfusion for acute core infarct. No other perfusion deficit. IMPRESSION: CT HEAD IMPRESSION: 1. Subacute evolving bilateral basal ganglia infarcts, left greater than right, overall stable in size and distribution as compared to previous. No evidence for hemorrhagic transformation or other complication. 2. No other new acute intracranial abnormality. CTA HEAD AND NECK IMPRESSION: 1. Stable CTA of the head and neck. No large vessel occlusion. No new stenosis, dissection, or other acute finding. 2.  Aortic Atherosclerosis (ICD10-I70.0). CT PERFUSION IMPRESSION: Negative CT perfusion. No evidence for acute core infarct or other perfusion deficit. Critical Value/emergent results were called by telephone at  the time of interpretation on 11/14/2019 at 11:30 am to provider ERIC [facility] , who verbally acknowledged these results. Electronically Signed   By: Jeannine Boga M.D.   On: 11/14/2019 00:43   CT Code Stroke CTA Neck W/WO contrast  Result Date: 11/10/2019 CLINICAL DATA:  Generalized weakness.  Encephalopathy. EXAM: CT ANGIOGRAPHY HEAD AND NECK CT PERFUSION BRAIN TECHNIQUE: Multidetector CT imaging of the head and neck was performed using the standard protocol during bolus administration of intravenous contrast. Multiplanar CT image reconstructions and MIPs were obtained to evaluate the vascular anatomy. Carotid stenosis measurements (when applicable) are obtained utilizing NASCET criteria, using the distal internal carotid diameter as the denominator. Multiphase CT imaging of the brain was performed following IV bolus contrast injection. Subsequent parametric perfusion maps were calculated using RAPID software. CONTRAST:  92mL OMNIPAQUE IOHEXOL 350 MG/ML SOLN COMPARISON:  None. FINDINGS: CTA NECK FINDINGS Aortic arch: Standard 3 vessel aortic arch with minimal atherosclerotic plaque. Widely patent arch vessel origins. Right carotid system: Patent without evidence of stenosis, dissection, or significant atherosclerosis. Left carotid system: Patent without evidence of stenosis, dissection, or significant atherosclerosis. Vertebral arteries: Patent and codominant without evidence of stenosis, dissection, or significant atherosclerosis. Skeleton: Severe facet arthrosis at C3-4 and C4-5 with grade 1 anterolisthesis at both levels. Severe disc degeneration at C5-6 and C6-7. Severe multilevel neural foraminal stenosis in the cervical spine. Other neck: No evidence of cervical lymphadenopathy or mass. Upper chest: No apical lung consolidation or mass. Review of the MIP images confirms the above findings CTA HEAD FINDINGS Anterior circulation: The internal carotid arteries are patent from skull base to carotid  termini with minimal nonstenotic plaque bilaterally. ACAs and MCAs are patent without evidence of proximal branch occlusion or significant proximal stenosis. No aneurysm is identified. Posterior circulation: The intracranial vertebral arteries are widely patent to the basilar. Patent PICA, AICA, and SCA origins are identified bilaterally. Posterior communicating arteries are not clearly identified and may be diminutive or absent. The PCAs are patent without evidence of significant proximal stenosis. No aneurysm is identified. Venous sinuses: As permitted by contrast timing, patent. Anatomic variants: None. Review of the MIP images confirms the above findings CT Brain Perfusion Findings: CBF (<30%) Volume: 0 mL Perfusion (Tmax>6.0s) volume: 0 mL Mismatch Volume: n/a Infarction Location: None evident by CTP. IMPRESSION: 1. No large vessel  occlusion or significant stenosis in the head and neck. 2. Negative CTP. 3. Advanced cervical disc and facet degeneration. 4.  Aortic Atherosclerosis (ICD10-I70.0). These results were communicated to Dr. Rory Percy at 10:53 am on 11/10/2019 by text page via the [facility] messaging system. Electronically Signed   By: Logan Bores M.D.   On: 11/10/2019 11:05   MR BRAIN WO CONTRAST  Result Date: 11/10/2019 CLINICAL DATA:  Generalized weakness. Encephalopathy. EXAM: MRI HEAD WITHOUT CONTRAST TECHNIQUE: Multiplanar, multiecho pulse sequences of the brain and surrounding structures were obtained without intravenous contrast. COMPARISON:  Head CT 11/10/2019 and MRI 02/28/2017 FINDINGS: Brain: There are patchy acute infarcts involving the basal ganglia/corona radiata bilaterally, left larger than right. A lacunar infarct in the posterior limb of the left internal capsule/lateral left thalamus is chronic but new from the prior MRI. T2 hyperintensities elsewhere in the cerebral white matter bilaterally have mildly progressed from the prior MRI and are nonspecific but compatible with mildly age  advanced chronic small vessel ischemic disease. No intracranial hemorrhage, mass, midline shift, or extra-axial fluid collection is identified. Mild cerebral atrophy is within normal limits for age. Vascular: Major intracranial vascular flow voids are preserved. Skull and upper cervical spine: Unremarkable bone marrow signal. C3-4 facet arthrosis with grade 1 anterolisthesis. Sinuses/Orbits: Right cataract extraction. Paranasal sinuses and mastoid air cells are clear. Other: None. IMPRESSION: 1. Patchy acute infarcts involving the left greater than right basal ganglia and corona radiata. 2. Mild chronic small vessel ischemic disease. Electronically Signed   By: Logan Bores M.D.   On: 11/10/2019 11:28   CT Code Stroke Cerebral Perfusion with contrast  Result Date: 11/14/2019 CLINICAL DATA:  74 year old female with history of recent strokes, mental status change, generalized weakness. EXAM: CT ANGIOGRAPHY HEAD AND NECK CT PERFUSION BRAIN TECHNIQUE: Multidetector CT imaging of the head and neck was performed using the standard protocol during bolus administration of intravenous contrast. Multiplanar CT image reconstructions and MIPs were obtained to evaluate the vascular anatomy. Carotid stenosis measurements (when applicable) are obtained utilizing NASCET criteria, using the distal internal carotid diameter as the denominator. Multiphase CT imaging of the brain was performed following IV bolus contrast injection. Subsequent parametric perfusion maps were calculated using RAPID software. CONTRAST:  174mL OMNIPAQUE IOHEXOL 350 MG/ML SOLN COMPARISON:  Recent MRI and CTA from 11/10/2019. FINDINGS: CT HEAD FINDINGS Brain: Age-related cerebral atrophy with mild chronic small vessel ischemic disease. Evolving subacute bilateral basal ganglia infarcts again noted, left greater than right, overall relatively stable in size and distribution. No evidence for hemorrhagic transformation or other complication. No other acute  intracranial hemorrhage. No new large vessel territory infarct. No mass lesion or midline shift. No hydrocephalus or extra-axial fluid collection. Vascular: No hyperdense vessel. Skull: Scalp soft tissues and calvarium within normal limits. Sinuses/Orbits: Globes orbital soft tissues within normal limits. Patient is status post cataract extraction on the right. Paranasal sinuses remain clear. No mastoid effusion. Other: None. ASPECTS ([place] Stroke Program Early CT Score) - Ganglionic level infarction (caudate, lentiform nuclei, internal capsule, insula, M1-M3 cortex): 7 - Supraganglionic infarction (M4-M6 cortex): 3 Total score (0-10 with 10 being normal): 10 Review of the MIP images confirms the above findings CTA NECK FINDINGS Aortic arch: Visualized aortic arch of normal caliber with normal 3 vessel morphology. Scattered atheromatous change about the arch and origin of the great vessels without hemodynamically significant stenosis. Visualized subclavian arteries remain patent. Right carotid system: Right common and internal carotid arteries widely patent without stenosis, dissection, or occlusion. Left carotid  system: Left common and internal carotid arteries widely patent without stenosis, dissection or occlusion. Vertebral arteries: Both vertebral arteries arise from the subclavian arteries. Vertebral arteries remain patent without stenosis, dissection, or occlusion. Skeleton: No new osseous abnormality. Advanced cervical spondylosis at C5-6 and C6-7 again noted. Other neck: No other new soft tissue abnormality within the neck. Upper chest: Mild scattered atelectatic changes noted within the visualized lungs. Visualized upper chest demonstrates no other acute finding. Review of the MIP images confirms the above findings CTA HEAD FINDINGS Anterior circulation: Internal carotid arteries remain patent to the termini. Mild atheromatous change within the cavernous/supraclinoid ICAs without significant stenosis. A1  segments patent bilaterally. Hypoplastic right A1 noted, accounting for the slightly diminutive right ICA. Normal anterior communicating artery. Anterior cerebral arteries widely patent to their distal aspects. No M1 stenosis or occlusion. Normal MCA bifurcations. Distal MCA branches remain well perfused. Posterior circulation: Both vertebral arteries widely patent to the vertebrobasilar junction. Both picas remain patent. Basilar patent to its distal aspect without stenosis. Superior cerebral arteries patent bilaterally. Both PCAs primarily supplied via the basilar well perfused to their distal aspects. Venous sinuses: Grossly patent allowing for timing the contrast bolus. Anatomic variants: Hypoplastic right A1.  No intracranial aneurysm. Review of the MIP images confirms the above findings CT Brain Perfusion Findings: ASPECTS: 10 CBF (<30%) Volume: 55mL Perfusion (Tmax>6.0s) volume: 22mL Mismatch Volume: 10mL Infarction Location:Negative CT perfusion for acute core infarct. No other perfusion deficit. IMPRESSION: CT HEAD IMPRESSION: 1. Subacute evolving bilateral basal ganglia infarcts, left greater than right, overall stable in size and distribution as compared to previous. No evidence for hemorrhagic transformation or other complication. 2. No other new acute intracranial abnormality. CTA HEAD AND NECK IMPRESSION: 1. Stable CTA of the head and neck. No large vessel occlusion. No new stenosis, dissection, or other acute finding. 2.  Aortic Atherosclerosis (ICD10-I70.0). CT PERFUSION IMPRESSION: Negative CT perfusion. No evidence for acute core infarct or other perfusion deficit. Critical Value/emergent results were called by telephone at the time of interpretation on 11/14/2019 at 11:30 am to provider ERIC [facility] , who verbally acknowledged these results. Electronically Signed   By: Jeannine Boga M.D.   On: 11/14/2019 00:43   CT Code Stroke Cerebral Perfusion with contrast  Result Date:  11/10/2019 CLINICAL DATA:  Generalized weakness.  Encephalopathy. EXAM: CT ANGIOGRAPHY HEAD AND NECK CT PERFUSION BRAIN TECHNIQUE: Multidetector CT imaging of the head and neck was performed using the standard protocol during bolus administration of intravenous contrast. Multiplanar CT image reconstructions and MIPs were obtained to evaluate the vascular anatomy. Carotid stenosis measurements (when applicable) are obtained utilizing NASCET criteria, using the distal internal carotid diameter as the denominator. Multiphase CT imaging of the brain was performed following IV bolus contrast injection. Subsequent parametric perfusion maps were calculated using RAPID software. CONTRAST:  62mL OMNIPAQUE IOHEXOL 350 MG/ML SOLN COMPARISON:  None. FINDINGS: CTA NECK FINDINGS Aortic arch: Standard 3 vessel aortic arch with minimal atherosclerotic plaque. Widely patent arch vessel origins. Right carotid system: Patent without evidence of stenosis, dissection, or significant atherosclerosis. Left carotid system: Patent without evidence of stenosis, dissection, or significant atherosclerosis. Vertebral arteries: Patent and codominant without evidence of stenosis, dissection, or significant atherosclerosis. Skeleton: Severe facet arthrosis at C3-4 and C4-5 with grade 1 anterolisthesis at both levels. Severe disc degeneration at C5-6 and C6-7. Severe multilevel neural foraminal stenosis in the cervical spine. Other neck: No evidence of cervical lymphadenopathy or mass. Upper chest: No apical lung consolidation or mass. Review of  the MIP images confirms the above findings CTA HEAD FINDINGS Anterior circulation: The internal carotid arteries are patent from skull base to carotid termini with minimal nonstenotic plaque bilaterally. ACAs and MCAs are patent without evidence of proximal branch occlusion or significant proximal stenosis. No aneurysm is identified. Posterior circulation: The intracranial vertebral arteries are widely  patent to the basilar. Patent PICA, AICA, and SCA origins are identified bilaterally. Posterior communicating arteries are not clearly identified and may be diminutive or absent. The PCAs are patent without evidence of significant proximal stenosis. No aneurysm is identified. Venous sinuses: As permitted by contrast timing, patent. Anatomic variants: None. Review of the MIP images confirms the above findings CT Brain Perfusion Findings: CBF (<30%) Volume: 0 mL Perfusion (Tmax>6.0s) volume: 0 mL Mismatch Volume: n/a Infarction Location: None evident by CTP. IMPRESSION: 1. No large vessel occlusion or significant stenosis in the head and neck. 2. Negative CTP. 3. Advanced cervical disc and facet degeneration. 4.  Aortic Atherosclerosis (ICD10-I70.0). These results were communicated to Dr. Rory Percy at 10:53 am on 11/10/2019 by text page via the [facility] messaging system. Electronically Signed   By: Logan Bores M.D.   On: 11/10/2019 11:05   DG CHEST PORT 1 VIEW  Result Date: 11/14/2019 CLINICAL DATA:  74 year old female with history of altered level of consciousness. EXAM: PORTABLE CHEST 1 VIEW COMPARISON:  Chest x-ray 11/10/2019. FINDINGS: Lung volumes are low. Bibasilar opacities favored to reflect areas of subsegmental atelectasis. No definite consolidative airspace disease. No pleural effusions. No evidence of pulmonary edema. Heart size is normal. Upper mediastinal contours are within normal limits. Aortic atherosclerosis. IMPRESSION: 1. Low lung volumes with bibasilar subsegmental atelectasis. 2. Aortic atherosclerosis. Electronically Signed   By: Vinnie Langton M.D.   On: 11/14/2019 05:14   DG Chest Portable 1 View  Result Date: 11/10/2019 CLINICAL DATA:  Altered mental status. EXAM: PORTABLE CHEST 1 VIEW COMPARISON:  12/25/2017 FINDINGS: The heart size and mediastinal contours are within normal limits. Both lungs are clear. The visualized skeletal structures are unremarkable. IMPRESSION: Normal exam.  Electronically Signed   By: Lorriane Shire M.D.   On: 11/10/2019 13:22   DG Foot 2 Views Right  Result Date: 11/18/2019 CLINICAL DATA:  Pain and bruising EXAM: RIGHT FOOT - 2 VIEW COMPARISON:  None. FINDINGS: Frontal and lateral views obtained. No acute fracture or dislocation. There is hallux valgus deformity at the first, second, and third MTP joints. There is narrowing of the first MTP joint. Other joint spaces appear unremarkable. There is a small inferior calcaneal spur. No erosive changes. IMPRESSION: No acute fracture or dislocation. Hallux valgus deformity at the first, second, and third MTP joints. Narrowing first MTP joint. Inferior calcaneal spur. Electronically Signed   By: Lowella Grip III M.D.   On: 11/18/2019 11:37   DG Swallowing Func-Speech Pathology  Result Date: 11/12/2019 Objective Swallowing Evaluation: Type of Study: MBS-Modified Barium Swallow Study  Patient Details Name: JIZZELLE LAPINSKY MRN: [id number] Date of Birth: [birth year] Today's Date: 11/12/2019 Time: SLP Start Time (ACUTE ONLY): 0910 -SLP Stop Time (ACUTE ONLY): 0930 SLP Time Calculation (min) (ACUTE ONLY): 20 min Past Medical History: Past Medical History: Diagnosis Date . Anxiety  . Aortic atherosclerosis ([place])  . Arthritis  . Complication of anesthesia   per pt, slow to wake up past sedation. . CVA (cerebral vascular accident) ([place]) 11/10/2019 . Depression  . Hyperplastic colon polyp  . Hypertension  . Neuropathy  . Osteoarthritis  . Osteoporosis  . Stomach upset  has a senstive stomach . Throat clearing   has a cough at times . Urinary incontinence  . Vitamin D deficiency  Past Surgical History: Past Surgical History: Procedure Laterality Date . COLONOSCOPY   . FACIAL COSMETIC SURGERY    over 15 years ago . FINGER TENDON REPAIR    middle rt hand- . HAMMER TOE SURGERY  12/27/2011  Procedure: HAMMER TOE CORRECTION;  Surgeon: Wylene Simmer, MD;  Location: [place];  Service: Orthopedics;  Laterality:  Left;  2-4th HPI: RHONNA DRUGAN is a 74 y.o. female with medical history significant of HTN and depression presenting with "slow thinking."  She reports she had difficulty talking after waking up.  Her mother's bedroom is downstairs and she came to the head of the stairs and couldn't figure out how to do this and appeared to have difficulty speaking.  +expressive aphasia.   Her left arm is feeling weak.  She had a headache 2 weeks ago.  No h/o prior CVA.  Yesterday was a perfectly normal day.    She was previously healthy and independent until about 2-3 years ago, and has slowed down in the last few years since moving in to her mother's home.  MRI + for B infarcts in basal ganglia and corona radiata. Pt  No data recorded Assessment / Plan / Recommendation CHL IP CLINICAL IMPRESSIONS 11/12/2019 Clinical Impression Pt demonstrates dysphagia with moderate oral deficits including difculty achieving a labial seal and lingual poordination for bolus formation and transit. There are instances of anterior and posterior spillage contributing to sensed aspiration events also due to delayed swallow inititiation. Moderate oral residue remained with solids, but pt benefitted from verbal cues to fully clear oral cavity by gather lingual residue. Simplest method for oral intake of liquids is teaspoon given the aforementioned deficits. There is also a moderate pharyngeal dysphagia with weakness of the  base of tongue and pharyngeal wall with mild residuals with all textures.  Attempted positional strategies, but most helpful were verbal and tactile cues for a neutral (not leaning) position and verbal cues to swallow again. Recommend nectar thick liquids, pureed solids and f/u therapy to target oral bolus control for upgraded solids.  Effortful swallows may also be beneficial.   SLP Visit Diagnosis Dysphagia, oropharyngeal phase (R13.12) Attention and concentration deficit following -- Frontal lobe and executive function  deficit following -- Impact on safety and function Moderate aspiration risk   CHL IP TREATMENT RECOMMENDATION 11/12/2019 Treatment Recommendations Therapy as outlined in treatment plan below   Prognosis 11/11/2019 Prognosis for Safe Diet Advancement Good Barriers to Reach Goals -- Barriers/Prognosis Comment -- CHL IP DIET RECOMMENDATION 11/12/2019 SLP Diet Recommendations Dysphagia 1 (Puree) solids;Nectar thick liquid Liquid Administration via Spoon Medication Administration -- Compensations Slow rate;Small sips/bites;Follow solids with liquid;Multiple dry swallows after each bite/sip;Effortful swallow Postural Changes Seated upright at 90 degrees   CHL IP OTHER RECOMMENDATIONS 11/12/2019 Recommended Consults -- Oral Care Recommendations -- Other Recommendations Order thickener from pharmacy   CHL IP FOLLOW UP RECOMMENDATIONS 11/12/2019 Follow up Recommendations Inpatient Rehab   CHL IP FREQUENCY AND DURATION 11/12/2019 Speech Therapy Frequency (ACUTE ONLY) min 2x/week Treatment Duration 2 weeks      CHL IP ORAL PHASE 11/12/2019 Oral Phase Impaired Oral - Pudding Teaspoon -- Oral - Pudding Cup -- Oral - Honey Teaspoon -- Oral - Honey Cup -- Oral - Nectar Teaspoon Weak lingual manipulation;Incomplete tongue to palate contact;Reduced posterior propulsion;Lingual/palatal residue;Decreased bolus cohesion;Delayed oral transit Oral - Nectar Cup Weak lingual  manipulation;Incomplete tongue to palate contact;Reduced posterior propulsion;Lingual/palatal residue;Decreased bolus cohesion;Delayed oral transit Oral - Nectar Straw Weak lingual manipulation;Incomplete tongue to palate contact;Reduced posterior propulsion;Lingual/palatal residue;Decreased bolus cohesion;Delayed oral transit Oral - Thin Teaspoon -- Oral - Thin Cup Weak lingual manipulation;Incomplete tongue to palate contact;Reduced posterior propulsion;Lingual/palatal residue;Decreased bolus cohesion;Delayed oral transit Oral - Thin Straw Weak lingual  manipulation;Incomplete tongue to palate contact;Reduced posterior propulsion;Lingual/palatal residue;Decreased bolus cohesion;Delayed oral transit Oral - Puree -- Oral - Mech Soft -- Oral - Regular -- Oral - Multi-Consistency -- Oral - Pill -- Oral Phase - Comment --  CHL IP PHARYNGEAL PHASE 11/12/2019 Pharyngeal Phase Impaired Pharyngeal- Pudding Teaspoon -- Pharyngeal -- Pharyngeal- Pudding Cup -- Pharyngeal -- Pharyngeal- Honey Teaspoon -- Pharyngeal -- Pharyngeal- Honey Cup -- Pharyngeal -- Pharyngeal- Nectar Teaspoon Reduced epiglottic inversion;Reduced tongue base retraction;Delayed swallow initiation-vallecula;Pharyngeal residue - valleculae;Pharyngeal residue - pyriform Pharyngeal -- Pharyngeal- Nectar Cup Reduced epiglottic inversion;Reduced tongue base retraction;Delayed swallow initiation-vallecula;Pharyngeal residue - valleculae;Pharyngeal residue - pyriform;Penetration/Aspiration before swallow;Trace aspiration Pharyngeal Material enters airway, passes BELOW cords without attempt by patient to eject out (silent aspiration);Material enters airway, CONTACTS cords and then ejected out Pharyngeal- Nectar Straw -- Pharyngeal -- Pharyngeal- Thin Teaspoon Reduced epiglottic inversion;Reduced tongue base retraction;Delayed swallow initiation-vallecula;Pharyngeal residue - valleculae;Pharyngeal residue - pyriform;Penetration/Aspiration before swallow Pharyngeal Material enters airway, passes BELOW cords and not ejected out despite cough attempt by patient Pharyngeal- Thin Cup Reduced epiglottic inversion;Reduced tongue base retraction;Delayed swallow initiation-vallecula;Pharyngeal residue - valleculae;Pharyngeal residue - pyriform;Penetration/Aspiration before swallow Pharyngeal Material enters airway, passes BELOW cords and not ejected out despite cough attempt by patient Pharyngeal- Thin Straw Reduced epiglottic inversion;Reduced tongue base retraction;Delayed swallow initiation-vallecula;Pharyngeal residue -  valleculae;Pharyngeal residue - pyriform Pharyngeal -- Pharyngeal- Puree Reduced epiglottic inversion;Reduced tongue base retraction;Delayed swallow initiation-vallecula;Pharyngeal residue - valleculae;Pharyngeal residue - pyriform Pharyngeal -- Pharyngeal- Mechanical Soft -- Pharyngeal -- Pharyngeal- Regular -- Pharyngeal -- Pharyngeal- Multi-consistency -- Pharyngeal -- Pharyngeal- Pill -- Pharyngeal -- Pharyngeal Comment --  No flowsheet data found. DeBlois, Katherene Ponto 11/12/2019, 2:24 PM              ECHOCARDIOGRAM COMPLETE  Result Date: 11/11/2019    ECHOCARDIOGRAM REPORT   Patient Name:   KENYADA BLIZARD Date of Exam: 11/11/2019 Medical Rec #:  [id number]            Height:       63.0 in Accession #:    [id number]           Weight:       127.9 lb Date of Birth:  05-25-1946           BSA:          1.599 m Patient Age:    74 years             BP:           165/86 mmHg Patient Gender: F                    HR:           69 bpm. Exam Location:  Inpatient Procedure: 2D Echo, Cardiac Doppler, Color Doppler and 3D Echo Indications:    Stroke  History:        Patient has no prior history of Echocardiogram examinations.                 Stroke; Risk Factors:Hypertension and Dyslipidemia.  Sonographer:    Roseanna Rainbow RDCS Referring Phys: [place]  1. Left ventricular ejection  fraction, by estimation, is 60 to 65%. The left ventricle has normal function. The left ventricle has no regional wall motion abnormalities. There is mild concentric left ventricular hypertrophy. Left ventricular diastolic parameters are consistent with Grade I diastolic dysfunction (impaired relaxation).  2. Right ventricular systolic function is normal. The right ventricular size is normal. Tricuspid regurgitation signal is inadequate for assessing PA pressure.  3. The mitral valve is grossly normal. No evidence of mitral valve regurgitation. No evidence of mitral stenosis.  4. The aortic valve is tricuspid. Aortic  valve regurgitation is mild. No aortic stenosis is present.  5. The inferior vena cava is normal in size with greater than 50% respiratory variability, suggesting right atrial pressure of 3 mmHg. FINDINGS  Left Ventricle: Left ventricular ejection fraction, by estimation, is 60 to 65%. The left ventricle has normal function. The left ventricle has no regional wall motion abnormalities. The left ventricular internal cavity size was normal in size. There is  mild concentric left ventricular hypertrophy. Left ventricular diastolic parameters are consistent with Grade I diastolic dysfunction (impaired relaxation). Normal left ventricular filling pressure. Right Ventricle: The right ventricular size is normal. No increase in right ventricular wall thickness. Right ventricular systolic function is normal. Tricuspid regurgitation signal is inadequate for assessing PA pressure. Left Atrium: Left atrial size was normal in size. Right Atrium: Right atrial size was normal in size. Pericardium: Trivial pericardial effusion is present. Presence of pericardial fat pad. Mitral Valve: The mitral valve is grossly normal. No evidence of mitral valve regurgitation. No evidence of mitral valve stenosis. Tricuspid Valve: The tricuspid valve is grossly normal. Tricuspid valve regurgitation is not demonstrated. No evidence of tricuspid stenosis. Aortic Valve: The aortic valve is tricuspid. Aortic valve regurgitation is mild. Aortic regurgitation PHT measures 411 msec. No aortic stenosis is present. Aortic valve mean gradient measures 4.0 mmHg. Aortic valve peak gradient measures 7.3 mmHg. Aortic  valve area, by VTI measures 1.66 cm. Pulmonic Valve: The pulmonic valve was grossly normal. Pulmonic valve regurgitation is not visualized. No evidence of pulmonic stenosis. Aorta: The aortic root and ascending aorta are structurally normal, with no evidence of dilitation. Venous: The inferior vena cava is normal in size with greater than 50%  respiratory variability, suggesting right atrial pressure of 3 mmHg. IAS/Shunts: The atrial septum is grossly normal.  LEFT VENTRICLE PLAX 2D LVIDd:         3.20 cm     Diastology LVIDs:         2.10 cm     LV e' lateral:   5.98 cm/s LV PW:         1.20 cm     LV E/e' lateral: 6.2 LV IVS:        1.30 cm     LV e' medial:    4.41 cm/s LVOT diam:     1.70 cm     LV E/e' medial:  8.4 LV SV:         41 LV SV Index:   26 LVOT Area:     2.27 cm  LV Volumes (MOD) LV vol d, MOD A2C: 57.3 ml LV vol d, MOD A4C: 59.1 ml LV vol s, MOD A2C: 16.1 ml LV vol s, MOD A4C: 22.1 ml LV SV MOD A2C:     41.2 ml LV SV MOD A4C:     59.1 ml LV SV MOD BP:      43.9 ml RIGHT VENTRICLE  IVC RV S prime:     13.60 cm/s  IVC diam: 1.00 cm TAPSE (M-mode): 2.1 cm LEFT ATRIUM             Index       RIGHT ATRIUM          Index LA diam:        2.70 cm 1.69 cm/m  RA Area:     9.88 cm LA Vol (A2C):   16.5 ml 10.32 ml/m RA Volume:   20.50 ml 12.82 ml/m LA Vol (A4C):   16.3 ml 10.19 ml/m LA Biplane Vol: 18.0 ml 11.26 ml/m  AORTIC VALVE AV Area (Vmax):    1.48 cm AV Area (Vmean):   1.55 cm AV Area (VTI):     1.66 cm AV Vmax:           135.00 cm/s AV Vmean:          88.700 cm/s AV VTI:            0.246 m AV Peak Grad:      7.3 mmHg AV Mean Grad:      4.0 mmHg LVOT Vmax:         88.30 cm/s LVOT Vmean:        60.500 cm/s LVOT VTI:          0.180 m LVOT/AV VTI ratio: 0.73 AI PHT:            411 msec  AORTA Ao Root diam: 2.60 cm Ao Asc diam:  3.90 cm MITRAL VALVE MV Area (PHT): 3.27 cm    SHUNTS MV Decel Time: 232 msec    Systemic VTI:  0.18 m MV E velocity: 37.20 cm/s  Systemic Diam: 1.70 cm MV A velocity: 79.40 cm/s MV E/A ratio:  0.47 Eleonore Chiquito MD Electronically signed by Eleonore Chiquito MD Signature Date/Time: 11/11/2019/5:18:18 PM    Final    CT HEAD CODE STROKE WO CONTRAST  Result Date: 11/10/2019 CLINICAL DATA:  Code stroke.  Generalized weakness. EXAM: CT HEAD WITHOUT CONTRAST TECHNIQUE: Contiguous axial images were obtained  from the base of the skull through the vertex without intravenous contrast. COMPARISON:  Head MRI 02/28/2017 FINDINGS: Brain: There is no evidence of acute large territory infarct, intracranial hemorrhage, mass, midline shift, or extra-axial fluid collection. Mild cerebral atrophy is within normal limits for age. Patchy hypodensities in the cerebral white matter bilaterally are nonspecific but compatible with mild to moderate chronic small vessel ischemic disease. Asymmetric, more focal hypodensities are present in the left basal ganglia, left internal capsule, and left corona radiata, new from the prior MRI and compatible with age indeterminate lacunar infarcts. Vascular: Calcified atherosclerosis at the skull base. No hyperdense vessel. Skull: No fracture or suspicious osseous lesion. Sinuses/Orbits: Visualized paranasal sinuses and mastoid air cells are clear. Right cataract extraction is noted. Other: None. ASPECTS [facility] Stroke Program Early CT Score) Not scored due to the lack of localizing symptoms. IMPRESSION: 1. Age indeterminate though possibly recent left basal ganglia region infarcts. 2. No evidence of acute cortically based infarct or intracranial hemorrhage. 3. Mild-to-moderate chronic small vessel ischemic disease. These findings were discussed via telephone with Dr. Rory Percy at 10:18 am on 11/10/2019. Electronically Signed   By: Logan Bores M.D.   On: 11/10/2019 10:22   US Abdomen Limited RUQ  Result Date: 11/18/2019 CLINICAL DATA:  Elevated liver enzymes EXAM: ULTRASOUND ABDOMEN LIMITED RIGHT UPPER QUADRANT COMPARISON:  None. FINDINGS: Gallbladder: No gallstones or wall thickening visualized. There is no  appreciable pericholecystic fluid. No sonographic Murphy sign noted by sonographer. Common bile duct: Diameter: 6 mm. No intrahepatic or extrahepatic biliary duct dilatation. Liver: No focal lesion identified. Within normal limits in parenchymal echogenicity. Portal vein is patent on color  Doppler imaging with normal direction of blood flow towards the liver. Other: None. IMPRESSION: Study within normal limits. Electronically Signed   By: Lowella Grip III M.D.   On: 11/18/2019 11:40    Assessment/Plan   #1 back rash-at this point will order hydrocortisone treatment twice daily and monitor-it does not appear cellulitic but will need to be monitored for resolution if no resolution notify provider.  2.  History of CVA with left-sided hemiparesis-she is on Plavix and aspirin for 3 months and then apparently will be on Plavix alone.  Her diet is pured with thin liquids-this will need to be monitored.  We will update a metabolic panel to make sure electrolytes and renal function remained stable.  3.  History of hypokalemia this was supplemented in hospital normalized will update a metabolic panel last potassium was 4.0 on May 5.  4.  Leukocytosis she is afebrile-there appears to be some chronicity to this in the hospital-we will recheck a CBC with differential.  [id number].

## 2019-12-08 ENCOUNTER — Encounter: Payer: Self-pay | Admitting: Internal Medicine

## 2019-12-08 LAB — CBC: RBC: 4.24 (ref 3.87–5.11)

## 2019-12-08 LAB — CBC AND DIFFERENTIAL
HCT: 40 (ref 36–46)
Hemoglobin: 13.2 (ref 12.0–16.0)
Platelets: 9 — AB (ref 150–399)
WBC: 10.8

## 2019-12-08 LAB — BASIC METABOLIC PANEL
BUN: 20 (ref 4–21)
CO2: 24 — AB (ref 13–22)
Chloride: 102 (ref 99–108)
Glucose: 87
Potassium: 4.1 (ref 3.4–5.3)
Sodium: 138 (ref 137–147)

## 2019-12-08 LAB — COMPREHENSIVE METABOLIC PANEL: Calcium: 9.6 (ref 8.7–10.7)

## 2019-12-11 ENCOUNTER — Telehealth: Payer: Self-pay | Admitting: Neurology

## 2019-12-11 NOTE — Telephone Encounter (Signed)
The following message was left with [employer] on 12/11/19 at 12:49 PM.  Caller sister had a bilateral stroke. Caller reports they are trying to cancel her phone service as she is unable to talk.   Caller needs a doctor's notes explaining since she is unable to on her behalf.

## 2019-12-15 NOTE — Telephone Encounter (Addendum)
Patient's son called requesting a second letter from Dr. Delice Lesch related to power of attorney and the ability for the family to pay the patient's bills.  He said the letter should state whether the patient "is or is not cognitively able to make her own decisions." He further explained, "It may be time for the POA to take over, that's me. My mom is switching to private pay in the next 24 hours and will be taken care of. We just need this letter as soon as possible to help expedite things."  Mail letter to son at:  [place], [place] [address]  Also, please give a copy of this letter to Clair Gulling when he gets the letter for the mobile phone service, per request.

## 2019-12-17 ENCOUNTER — Encounter: Payer: Self-pay | Admitting: Internal Medicine

## 2019-12-17 ENCOUNTER — Non-Acute Institutional Stay (SKILLED_NURSING_FACILITY): Payer: Medicare Other | Admitting: Internal Medicine

## 2019-12-17 DIAGNOSIS — R109 Unspecified abdominal pain: Secondary | ICD-10-CM

## 2019-12-17 DIAGNOSIS — Z960 Presence of urogenital implants: Secondary | ICD-10-CM | POA: Diagnosis not present

## 2019-12-17 LAB — HEPATIC FUNCTION PANEL
ALT: 28 (ref 7–35)
AST: 22 (ref 13–35)

## 2019-12-17 LAB — BASIC METABOLIC PANEL
BUN: 15 (ref 4–21)
CO2: 21 (ref 13–22)
Chloride: 102 (ref 99–108)
Creatinine: 0.4 — AB (ref 0.5–1.1)
Glucose: 140
Potassium: 3.6 (ref 3.4–5.3)
Sodium: 136 — AB (ref 137–147)

## 2019-12-17 LAB — CBC: RBC: 4.21 (ref 3.87–5.11)

## 2019-12-17 LAB — CBC AND DIFFERENTIAL
HCT: 38 (ref 36–46)
Hemoglobin: 12.6 (ref 12.0–16.0)
Platelets: 319 (ref 150–399)
WBC: 11.2

## 2019-12-17 LAB — COMPREHENSIVE METABOLIC PANEL
Albumin: 3.1 — AB (ref 3.5–5.0)
Calcium: 9 (ref 8.7–10.7)
Globulin: 2.2

## 2019-12-17 NOTE — Progress Notes (Signed)
This is an acute visit.  Level care skilled.  [facility]ility [facility].  Chief complaint is acute visit secondary to abdominal discomfort.  History of present illness.  Patient is a pleasant 74 year old female who is here for rehab after sustaining a significant CVA with left-sided hemiplegia.  She actually has made some progress which is encouraging but continues to have challenges.  Per nursing staff her appetite has gotten better.  Apparently when family was visiting late this afternoon she complained of some abdominal discomfort.  Vital signs are stable there has been no nausea or vomiting apparently.  But family is concerned that she seems to be pointing to her stomach indicating discomfort  When I talked to patient she did state she has been having some abdominal discomfort apparently intermittent since yesterday.  Apparently no complaints of diarrhea or extended constipation.  There has been no vomiting.  She has eaten some of her dinner tonight.  Per chart review I do not see an extensive GI history I do see a listed history of sensitive stomach.  .  Appears back in April she did have a normal abdominal ultrasound.   Past Medical History:  Diagnosis Date  . Anxiety   . Aortic atherosclerosis ([place])   . Arthritis   . Complication of anesthesia    per pt, slow to wake up past sedation.  . CVA (cerebral vascular accident) ([place]) 11/10/2019  . Depression   . Hyperplastic colon polyp   . Hypertension   . Neuropathy   . Osteoarthritis   . Osteoporosis   . Stomach upset    has a senstive stomach  . Throat clearing    has a cough at times  . Urinary incontinence   . Vitamin D deficiency         Past Surgical History:  Procedure Laterality Date  . COLONOSCOPY    . FACIAL COSMETIC SURGERY     over 15 years ago  . FINGER TENDON REPAIR     middle rt hand-  . HAMMER TOE SURGERY  12/27/2011   Procedure: HAMMER TOE CORRECTION;   Surgeon: Wylene Simmer, MD;  Location: [facility];  Service: Orthopedics;  Laterality: Left;  2-4th        Allergies  Allergen Reactions  . Chlorhexidine    MEDICATIONS       Medication Sig  . aspirin 81 MG tablet Take 81 mg by mouth daily.  Marland Kitchen atorvastatin (LIPITOR) 40 MG tablet Take 1 tablet (40 mg total) by mouth daily.  . bisacodyl (BISACODYL) 5 MG EC tablet Take 5 mg by mouth daily as needed for mild constipation or moderate constipation.  . bisacodyl (DULCOLAX) 10 MG suppository If not relieved by MOM, give 10 mg Bisacodyl suppositiory rectally X 1 dose in 24 hours as needed (Do not use constipation standing orders for residents with renal failure/CFR less than 30. Contact MD for orders) (Physician Order)  . clopidogrel (PLAVIX) 75 MG tablet Take 1 tablet (75 mg total) by mouth daily.  . magnesium hydroxide (MILK OF MAGNESIA) 400 MG/5ML suspension If no BM in 3 days, give 30 cc Milk of Magnesium p.o. x 1 dose in 24 hours as needed (Do not use standing constipation orders for residents with renal failure CFR less than 30. Contact MD for orders) (Physician Order)  . metoprolol tartrate (LOPRESSOR) 25 MG tablet Take 1 tablet (25 mg total) by mouth 2 (two) times daily.  . NON FORMULARY Diet: Puree with Thin  Liquids  . NON FORMULARY 4oz Medpass d/t Dx Malnutrition/poor intake.  . Sodium Phosphates (RA SALINE ENEMA RE) If not relieved by Biscodyl suppository, give disposable Saline Enema rectally X 1 dose/24 hrs as needed (Do not use constipation standing orders for residents with renal failure/CFR less than 30. Contact MD for orders)(Physician Or   .   Review of systems  General.  Is not complaining of fever chills-appetite remains somewhat spotty but per nursing she has been doing better lately.  Skin does not complain of rashes or itching at this point.  Head ears eyes nose mouth and throat does have a history of hypophonia apparently this has improved somewhat  also history of some hearing loss.  Eyes is not complaining of changes in vision.  Respiratory does not complain having a cough or being short of breath.  Cardiac does not complain of chest pain or increasing edema.  GI as noted above is complaining of some abdominal discomfort does not really complain of nausea or vomiting.  Is not having diarrhea-unsure of most recent bowel movement.  GU does not complain of dysuria she does have an indwelling Foley catheter with dark amber-colored urine.  Musculoskeletal is positive for speech deficits and left-sided weakness.  Psych she is pleasant appropriate   .  Physical exam.  Temperature is 98.0 pulse 78 respirations 18 blood pressure 125/75.  In general this is a pleasant elderly female she does not appear to be in any acute distress.  Her skin is warm and dry.  Eyes visual acuity appears grossly intact-oropharynx is clear mucous membranes moist.  Chest is clear to auscultation with somewhat shallow air entry could not appreciate any overt congestion or labored breathing.  Heart is regular rate and rhythm without murmur gallop or rub-she has fairly minimal lower extremity edema.  Abdomen is somewhat protuberant it is soft appears to moderately tender to palpitation-- but I would not classify this as acute tenderness-tenderness was hard to localize she does have bowel sounds although somewhat reduced  GU does have an indwelling Foley catheter draining dark-amber urine  Musculoskeletal is positive for left-sided hemiplegia.  She does move her right upper and lower extremities it appears at relative baseline.  Neurologic positive for left-sided hemiplegia she does have hypophonia and some aphasia although this appears to have improved somewhat.  Psych she is pleasant appropriate   Labs.  Dec 08, 2019.  WBC 10.8 hemoglobin 13.2 platelets 308.  Sodium 138 potassium 4.1 BUN 19.6 creatinine 0.43   Nov 22, 2019.  Albumin was  2.1 otherwise liver function tests within normal limits    Assessment plan.  1.  Abdominal discomfort unknown etiology-at this point I sac location of pain is difficult to localize-I would classify this is moderate tenderness to palpation-I did speak extensively with her brother as well mother-they states she has been complaining of abdominal pain during the visit today and has been fairly persistent.  Patient does not want to go to the hospital for evaluation.  I also spoke with her brother and mother-who agree with this as well.  Will order stat blood work including a CMP CBC with differential amylase and lipase.   will order an x-ray of the abdomen and hopefully can accomplish an ultrasound when possible tomorrow.  Also will order a UA C&S to rule out any possible lower abdominal discomfort from a UTI.  Also will order vital signs pulse ox every shift for every 4 hours to keep an eye on her-.  At this point she does not appear to be in any distress but will need close monitoring-I did speak with nursing and if abdominal pain increases will most likely need to go to the hospital-and family agrees with this as well.  [id number] note greater than 35 minutes spent assessing patient-reviewing her chart and labs-discussing her status with nursing-as well as with her brother and mother at bedside-and coordinating a plan of care-of note greater than 50% of time spent coordinating a plan of care with input as noted above

## 2019-12-18 ENCOUNTER — Encounter: Payer: Self-pay | Admitting: Internal Medicine

## 2019-12-18 ENCOUNTER — Non-Acute Institutional Stay (SKILLED_NURSING_FACILITY): Payer: Medicare Other | Admitting: Internal Medicine

## 2019-12-18 ENCOUNTER — Encounter: Payer: Self-pay | Admitting: Neurology

## 2019-12-18 ENCOUNTER — Telehealth: Payer: Self-pay | Admitting: Family

## 2019-12-18 DIAGNOSIS — R109 Unspecified abdominal pain: Secondary | ICD-10-CM | POA: Diagnosis not present

## 2019-12-18 DIAGNOSIS — D72825 Bandemia: Secondary | ICD-10-CM | POA: Diagnosis not present

## 2019-12-18 DIAGNOSIS — Z960 Presence of urogenital implants: Secondary | ICD-10-CM

## 2019-12-18 NOTE — Telephone Encounter (Signed)
Spoke with pt son he will have his uncle come by and pick up the letter

## 2019-12-18 NOTE — Telephone Encounter (Signed)
Nurse called on call provider Patient's KUB results showed possible ileus,WBC 11.0 afebrile.U/S and lipase and amylase results pending.patient decline ED follow up.

## 2019-12-18 NOTE — Telephone Encounter (Signed)
Letter done, thanks.

## 2019-12-18 NOTE — Telephone Encounter (Signed)
Did you make her NPO and advance diet gradually?

## 2019-12-18 NOTE — Telephone Encounter (Signed)
Spoke to pt son will call him when letter is ready, he was also given the number to geriatrics consulting services for competency and capacity eval.

## 2019-12-18 NOTE — Telephone Encounter (Signed)
Patient son is calling about the letter and would like a phone call back today

## 2019-12-18 NOTE — Telephone Encounter (Signed)
She wanted to be evaluated this morning.

## 2019-12-18 NOTE — Progress Notes (Signed)
Location:  [facility] Room Number: [place]:  SNF (31) Provider:  Eria Lozoya L. Mariea Clonts, D.O., C.M.D.  Marin Olp, MD  Patient Care Team: Marin Olp, MD as PCP - General (Family Medicine) Janne Lab, MD (Obstetrics and Gynecology) Wylene Simmer, MD as Consulting Physician (Orthopedic Surgery) Cameron Sprang, MD as Consulting Physician (Neurology)  Extended Emergency Contact Information Primary Emergency Contact: [facility] Address: [address]          [address]          [place], [place] [address] [place] Phone: [phone] Mobile Phone: [phone] Relation: Mother Secondary Emergency Contact: jerris, wojtkowski Mobile Phone: [phone] Relation: Brother  Code Status:  Full code Goals of care: Advanced Directive information Advanced Directives 12/18/2019  Does Patient Have a Medical Advance Directive? Yes  Type of Advance Directive -  Does patient want to make changes to medical advance directive? No - Patient declined  Would patient like information on creating a medical advance directive? -     Chief Complaint  Patient presents with  . Acute Visit    Ileus on KUB    HPI:  Pt is a 74 y.o. female here at [facility] for short-term rehab s/p CVA with left hemiplegia, aphasia seen today for an acute visit for ileus noted on KUB.  PA saw resident yesterday for abdominal pain and had ordered KUB and labs.  Labs showed mild elevation of wbc (has had this since admission and previously had negative CXR for aspiration which had been my concern at her admission visit b/c she was rhonchorous).  She still c/o some pain, but has been having bowel movements and has bowel sounds.  UA also appears infected,and there is some elevation of amylase but not lipase.  US of the abdomen was also negative.  She ate very little at lunch and was resting in bed.  She did not wince or moan during her exam.    Past  Medical History:  Diagnosis Date  . Anxiety   . Aortic atherosclerosis ([place])   . Arthritis   . Complication of anesthesia    per pt, slow to wake up past sedation.  . CVA (cerebral vascular accident) ([place]) 11/10/2019  . Depression   . Hyperplastic colon polyp   . Hypertension   . Neuropathy   . Osteoarthritis   . Osteoporosis   . Stomach upset    has a senstive stomach  . Throat clearing    has a cough at times  . Urinary incontinence   . Vitamin D deficiency    Past Surgical History:  Procedure Laterality Date  . COLONOSCOPY    . FACIAL COSMETIC SURGERY     over 15 years ago  . FINGER TENDON REPAIR     middle rt hand-  . HAMMER TOE SURGERY  12/27/2011   Procedure: HAMMER TOE CORRECTION;  Surgeon: Wylene Simmer, MD;  Location: [facility];  Service: Orthopedics;  Laterality: Left;  2-4th    Allergies  Allergen Reactions  . Chlorhexidine     Outpatient Encounter Medications as of 12/18/2019  Medication Sig  . aspirin 81 MG tablet Take 81 mg by mouth daily.  Marland Kitchen atorvastatin (LIPITOR) 40 MG tablet Take 1 tablet (40 mg total) by mouth daily.  . bisacodyl (BISACODYL) 5 MG EC tablet Take 5 mg by mouth daily as needed for mild constipation or moderate constipation.  . bisacodyl (DULCOLAX) 10  MG suppository If not relieved by MOM, give 10 mg Bisacodyl suppositiory rectally X 1 dose in 24 hours as needed (Do not use constipation standing orders for residents with renal failure/CFR less than 30. Contact MD for orders) (Physician Order)  . clopidogrel (PLAVIX) 75 MG tablet Take 1 tablet (75 mg total) by mouth daily.  . magnesium hydroxide (MILK OF MAGNESIA) 400 MG/5ML suspension If no BM in 3 days, give 30 cc Milk of Magnesium p.o. x 1 dose in 24 hours as needed (Do not use standing constipation orders for residents with renal failure CFR less than 30. Contact MD for orders) (Physician Order)  . metoprolol tartrate (LOPRESSOR) 25 MG tablet Take 1 tablet (25 mg total) by  mouth 2 (two) times daily.  . NON FORMULARY Diet: Puree with Thin Liquids  . NON FORMULARY 4oz Medpass d/t Dx Malnutrition/poor intake.  . [DISCONTINUED] Sodium Phosphates (RA SALINE ENEMA RE) If not relieved by Biscodyl suppository, give disposable Saline Enema rectally X 1 dose/24 hrs as needed (Do not use constipation standing orders for residents with renal failure/CFR less than 30. Contact MD for orders)(Physician Or   No facility-administered encounter medications on file as of 12/18/2019.    Review of Systems  Constitutional: Negative for chills and fever.  Gastrointestinal: Positive for abdominal pain. Negative for blood in stool, constipation, diarrhea, heartburn, melena, nausea and vomiting.  Genitourinary: Negative for dysuria.  Neurological: Positive for speech change and focal weakness. Negative for loss of consciousness.       From stroke    Immunization History  Administered Date(s) Administered  . Fluad Quad(high Dose 65+) 04/27/2019  . Influenza, High Dose Seasonal PF 05/03/2017, 05/19/2018  . Influenza,inj,Quad PF,6+ Mos 04/29/2013, 05/18/2014, 05/02/2015, 04/17/2016  . PFIZER SARS-COV-2 Vaccination 08/27/2019, 09/21/2019  . Pneumococcal Conjugate-13 05/11/2013  . Pneumococcal Polysaccharide-23 05/02/2015   Pertinent  Health Maintenance Due  Topic Date Due  . MAMMOGRAM  01/09/2019  . DEXA SCAN  12/26/2019  . INFLUENZA VACCINE  02/21/2020  . COLONOSCOPY  03/11/2024  . PNA vac Low Risk Adult  Completed   Fall Risk  12/07/2019 04/27/2019 08/05/2018 06/17/2018 12/25/2017  Falls in the past year? 0 1 0 0 No  Number falls in past yr: 0 0 - 0 -  Injury with Fall? 0 1 - 0 -   Functional Status Survey:    Vitals:   12/18/19 1346  BP: 135/82  Pulse: 62  Temp: 98.1 F (36.7 C)  Weight: 116 lb (52.6 kg)  Height: 5\' 3"  (1.6 m)   Body mass index is 20.55 kg/m. Physical Exam Vitals and nursing note reviewed.  Constitutional:      General: She is not in acute  distress.    Appearance: She is not toxic-appearing.  HENT:     Head: Normocephalic and atraumatic.  Cardiovascular:     Rate and Rhythm: Normal rate and regular rhythm.  Pulmonary:     Effort: Pulmonary effort is normal.     Breath sounds: Normal breath sounds.  Abdominal:     General: Abdomen is flat. Bowel sounds are normal. There is no distension.     Palpations: Abdomen is soft. There is no mass.     Tenderness: There is no abdominal tenderness. There is no right CVA tenderness, left CVA tenderness, guarding or rebound.  Genitourinary:    Comments: Foley in place with dark yellow urine Musculoskeletal:     Right lower leg: No edema.     Left lower leg: No edema.  Neurological:     Mental Status: She is alert.     Comments: Hypophonia, some aphasia remains; left hemiplegia     Labs reviewed: Recent Labs    11/18/19 0343 11/19/19 0240 11/21/19 0308 11/21/19 0308 11/22/19 0502 11/23/19 0356 11/25/19 0000  NA 139   < > 147*   < > 144 141 145  K 4.4   < > 3.6   < > 3.2* 3.8 4.0  CL 108   < > 112*   < > 112* 110 107  CO2 12*   < > 27   < > 25 23 22   GLUCOSE 123*   < > 110*  --  113* 102*  --   BUN 21   < > 29*   < > 25* 17 32*  CREATININE 0.62   < > 0.65   < > 0.60 0.56 0.5  CALCIUM 9.1   < > 9.2   < > 8.8* 8.8* 9.4  MG 2.0  --  2.2  --   --  1.8  --   PHOS  --   --  2.8  --   --   --   --    < > = values in this interval not displayed.   Recent Labs    11/20/19 0332 11/21/19 0308 11/22/19 0502  AST 30 26 22   ALT 66* 51* 37  ALKPHOS 80 77 66  BILITOT 0.6 0.5 0.4  PROT 5.7* 5.7* 5.0*  ALBUMIN 2.3* 2.4* 2.1*   Recent Labs    11/21/19 0308 11/21/19 0308 11/22/19 0502 11/23/19 0356 11/25/19 0000  WBC 12.9*   < > 13.9* 13.0* 13.3  NEUTROABS 10.2*  --  11.1* 10.5*  --   HGB 14.0   < > 12.4 13.0 13.3  HCT 44.6   < > 40.0 40.2 41  MCV 98.0  --  98.8 96.6  --   PLT 442*   < > 411* 431* 455*   < > = values in this interval not displayed.   Lab Results    Component Value Date   TSH 1.728 11/10/2019   Lab Results  Component Value Date   HGBA1C 5.1 11/11/2019   Lab Results  Component Value Date   CHOL 196 11/11/2019   HDL 65 11/11/2019   LDLCALC 115 (H) 11/11/2019   LDLDIRECT 116.0 04/27/2019   TRIG 78 11/11/2019   CHOLHDL 3.0 11/11/2019    KUB--possible ileus vs enteritis US--no acute pathology  Assessment/Plan 1. Abdominal pain, unspecified abdominal location -bowels are moving, good bowel sounds, not tender today -suspect UTI as cause at this point   2. Urinary catheter in place -increases risk of infection, certainly gets colonized  3. Bandemia -persists and nothing found on blood tests, prior negative cxr and positive UA, so await final urine culture to choose best abx  Family/ staff Communication: discussed with snf nurse  Labs/tests ordered:  Await urine culture at this point  [place]. Rhythm Gubbels, D.O. [facility] [address], [place] [address] Cell Phone (Mon-Fri 8am-5pm):  [phone] On Call:  [phone] & follow prompts after 5pm & weekends Office Phone:  [phone] Office Fax:  [phone]

## 2019-12-18 NOTE — Telephone Encounter (Signed)
Pt son called no answer voice mail left for him to call back

## 2019-12-22 ENCOUNTER — Encounter: Payer: Self-pay | Admitting: Neurology

## 2019-12-24 LAB — COMPREHENSIVE METABOLIC PANEL: Albumin: 3.2 — AB (ref 3.5–5.0)

## 2019-12-24 LAB — HEPATIC FUNCTION PANEL
ALT: 38 — AB (ref 7–35)
AST: 23 (ref 13–35)

## 2019-12-31 ENCOUNTER — Other Ambulatory Visit: Payer: Self-pay | Admitting: Physician Assistant

## 2019-12-31 DIAGNOSIS — I4891 Unspecified atrial fibrillation: Secondary | ICD-10-CM

## 2019-12-31 DIAGNOSIS — I639 Cerebral infarction, unspecified: Secondary | ICD-10-CM

## 2020-01-01 ENCOUNTER — Encounter: Payer: Self-pay | Admitting: Internal Medicine

## 2020-01-01 ENCOUNTER — Other Ambulatory Visit (HOSPITAL_COMMUNITY): Payer: Self-pay | Admitting: Internal Medicine

## 2020-01-01 ENCOUNTER — Other Ambulatory Visit: Payer: Self-pay | Admitting: Internal Medicine

## 2020-01-01 ENCOUNTER — Non-Acute Institutional Stay (SKILLED_NURSING_FACILITY): Payer: Medicare Other | Admitting: Internal Medicine

## 2020-01-01 DIAGNOSIS — I639 Cerebral infarction, unspecified: Secondary | ICD-10-CM

## 2020-01-01 DIAGNOSIS — I69391 Dysphagia following cerebral infarction: Secondary | ICD-10-CM

## 2020-01-01 DIAGNOSIS — R1013 Epigastric pain: Secondary | ICD-10-CM | POA: Diagnosis not present

## 2020-01-01 DIAGNOSIS — R4701 Aphasia: Secondary | ICD-10-CM

## 2020-01-01 DIAGNOSIS — I69354 Hemiplegia and hemiparesis following cerebral infarction affecting left non-dominant side: Secondary | ICD-10-CM

## 2020-01-01 LAB — CBC AND DIFFERENTIAL
HCT: 43 (ref 36–46)
Hemoglobin: 14 (ref 12.0–16.0)
Platelets: 318 (ref 150–399)
WBC: 12.8

## 2020-01-01 LAB — CBC: RBC: 4.66 (ref 3.87–5.11)

## 2020-01-01 LAB — BASIC METABOLIC PANEL
BUN: 13 (ref 4–21)
CO2: 21 (ref 13–22)
Chloride: 105 (ref 99–108)
Creatinine: 0.4 — AB (ref 0.5–1.1)
Glucose: 92
Potassium: 3.7 (ref 3.4–5.3)
Sodium: 141 (ref 137–147)

## 2020-01-01 LAB — COMPREHENSIVE METABOLIC PANEL: Calcium: 9.9 (ref 8.7–10.7)

## 2020-01-01 MED ORDER — PANTOPRAZOLE SODIUM 40 MG PO TBEC
40.0000 mg | DELAYED_RELEASE_TABLET | Freq: Every day | ORAL | 3 refills | Status: DC
Start: 2020-01-01 — End: 2020-01-04

## 2020-01-01 NOTE — Progress Notes (Signed)
Location:  [facility] Room Number: [place] of Service:  SNF (31) Provider:  Jamilee Lafosse L. Mariea Clonts, D.O., C.M.D.  Marin Olp, MD  Patient Care Team: Marin Olp, MD as PCP - General (Family Medicine) Janne Lab, MD (Obstetrics and Gynecology) Wylene Simmer, MD as Consulting Physician (Orthopedic Surgery) Cameron Sprang, MD as Consulting Physician (Neurology)  Extended Emergency Contact Information Primary Emergency Contact: [facility] Address: [address]          [address]          [place], [place] [address] [place] Phone: [phone] Mobile Phone: [phone] Relation: Mother Secondary Emergency Contact: rakesha, dalporto Mobile Phone: [phone] Relation: Brother  Code Status:  Full code Goals of care: Advanced Directive information Advanced Directives 01/01/2020  Does Patient Have a Medical Advance Directive? Yes  Type of Advance Directive Out of facility DNR (pink MOST or yellow form)  Does patient want to make changes to medical advance directive? No - Patient declined  Would patient like information on creating a medical advance directive? -  Pre-existing out of facility DNR order (yellow form or pink MOST form) Pink MOST/Yellow Form most recent copy in chart - Physician notified to receive inpatient order   Chief Complaint  Patient presents with  . Acute Visit    intermittent stabbing abdominal pain    HPI:  Lynn [name] is a 74 y.o. female here for short-term rehab s/p severe stroke with left hemiparesis, dysphagia, facial droop and dysarthria seen today for an acute visit for abdominal pain that has been coming and going since last night.  She has been on asa and plavix (for a 3 month course) since her stroke and not on PPI.  After 3 mos, she will be on plavix alone.  She was given tylenol w/o improvement.  She also was given a supplement shake w/o improvement (she has only been eating small amounts  and getting frustrated with use of her hand quickly so her mother is coming to help at a meal when possible to get a visiting time at that point).  She had some abdominal pain on a few prior times here since her admission, as well.  She wound up having a KUB with ileus at one point that then cleared up.  She is moving her bowels now.  She also had a RUQ [place] after her CXR was unrevealing and she'd had leukocytosis, but the [place] was negative for cholelithiasis or choledocholithiasis.    She says the pain is coming and going and is stabbing right in the middle of her abdomen.  She is unable to say if eating helps typically.    Past Medical History:  Diagnosis Date  . Anxiety   . Aortic atherosclerosis ([facility])   . Arthritis   . Complication of anesthesia    per Lynn [name], slow to wake up past sedation.  . CVA (cerebral vascular accident) ([facility]) 11/10/2019  . Depression   . Hyperplastic colon polyp   . Hypertension   . Neuropathy   . Osteoarthritis   . Osteoporosis   . Stomach upset    has a senstive stomach  . Throat clearing    has a cough at times  . Urinary incontinence   . Vitamin D deficiency    Past Surgical History:  Procedure Laterality Date  . COLONOSCOPY    . FACIAL COSMETIC SURGERY     over 15 years ago  . FINGER TENDON  REPAIR     middle rt hand-  . HAMMER TOE SURGERY  12/27/2011   Procedure: HAMMER TOE CORRECTION;  Surgeon: Wylene Simmer, MD;  Location: [facility];  Service: Orthopedics;  Laterality: Left;  2-4th    Allergies  Allergen Reactions  . Chlorhexidine     Outpatient Encounter Medications as of 01/01/2020  Medication Sig  . acetaminophen (TYLENOL) 325 MG tablet Take 650 mg by mouth every 6 (six) hours as needed.  Marland Kitchen aspirin 81 MG tablet Take 81 mg by mouth daily.  Marland Kitchen atorvastatin (LIPITOR) 40 MG tablet Take 1 tablet (40 mg total) by mouth daily.  . bisacodyl (BISACODYL) 5 MG EC tablet Take 5 mg by mouth daily as needed for mild constipation or moderate  constipation.  . hydrocortisone cream 1 % Apply 1 application topically 2 (two) times daily.  . magnesium hydroxide (MILK OF MAGNESIA) 400 MG/5ML suspension If no BM in 3 days, give 30 cc Milk of Magnesium p.o. x 1 dose in 24 hours as needed (Do not use standing constipation orders for residents with renal failure CFR less than 30. Contact MD for orders) (Physician Order)  . metoprolol tartrate (LOPRESSOR) 25 MG tablet Take 1 tablet (25 mg total) by mouth 2 (two) times daily.  . NON FORMULARY Diet: Puree with Thin Liquids  . NON FORMULARY 4oz Medpass d/t Dx Malnutrition/poor intake.  Marland Kitchen omeprazole (PRILOSEC) 20 MG capsule Take 20 mg by mouth daily. Give 1 tablet by mouth before evening med daily for GERD.  Marland Kitchen pantoprazole (PROTONIX) 40 MG tablet Take 1 tablet (40 mg total) by mouth daily.  . simethicone (MYLICON) 80 MG chewable tablet Chew 80 mg by mouth every 6 (six) hours as needed for flatulence.  . [DISCONTINUED] clopidogrel (PLAVIX) 75 MG tablet Take 1 tablet (75 mg total) by mouth daily.  . [DISCONTINUED] bisacodyl (DULCOLAX) 10 MG suppository If not relieved by MOM, give 10 mg Bisacodyl suppositiory rectally X 1 dose in 24 hours as needed (Do not use constipation standing orders for residents with renal failure/CFR less than 30. Contact MD for orders) (Physician Order)   No facility-administered encounter medications on file as of 01/01/2020.    Review of Systems  Constitutional: Negative for chills and fever.  HENT: Negative for congestion.   Eyes: Negative for blurred vision.  Respiratory: Negative for cough and shortness of breath.   Cardiovascular: Negative for chest pain, palpitations and leg swelling.  Gastrointestinal: Positive for abdominal pain. Negative for blood in stool, constipation, diarrhea, heartburn, melena, nausea and vomiting.  Genitourinary: Negative for dysuria.  Musculoskeletal: Negative for falls.  Skin: Negative for itching and rash.  Neurological: Positive for  speech change. Negative for dizziness and loss of consciousness.       No change  Psychiatric/Behavioral: Positive for memory loss.    Immunization History  Administered Date(s) Administered  . Fluad Quad(high Dose 65+) 04/27/2019  . Influenza, High Dose Seasonal PF 05/03/2017, 05/19/2018  . Influenza,inj,Quad PF,6+ Mos 04/29/2013, 05/18/2014, 05/02/2015, 04/17/2016  . PFIZER SARS-COV-2 Vaccination 08/27/2019, 09/21/2019  . Pneumococcal Conjugate-13 05/11/2013  . Pneumococcal Polysaccharide-23 05/02/2015   Pertinent  Health Maintenance Due  Topic Date Due  . MAMMOGRAM  01/09/2019  . DEXA SCAN  12/26/2019  . INFLUENZA VACCINE  02/21/2020  . COLONOSCOPY  03/11/2024  . PNA vac Low Risk Adult  Completed   Fall Risk  12/07/2019 04/27/2019 08/05/2018 06/17/2018 12/25/2017  Falls in the past year? 0 1 0 0 No  Number falls in past  yr: 0 0 - 0 -  Injury with Fall? 0 1 - 0 -   Functional Status Survey:    Vitals:   01/01/20 1544  BP: 122/60  Pulse: (!) 58  Temp: (!) 96.2 F (35.7 C)  Weight: 113 lb 12.8 oz (51.6 kg)  Height: 5\' 3"  (1.6 m)   Body mass index is 20.16 kg/m. Physical Exam Vitals reviewed.  Constitutional:      Appearance: Normal appearance. She is not toxic-appearing.  HENT:     Head: Normocephalic and atraumatic.  Cardiovascular:     Rate and Rhythm: Normal rate and regular rhythm.  Pulmonary:     Effort: Pulmonary effort is normal.     Breath sounds: Normal breath sounds. No wheezing, rhonchi or rales.  Abdominal:     General: Bowel sounds are normal. There is no distension.     Palpations: Abdomen is soft. There is no mass.     Tenderness: There is abdominal tenderness. There is no guarding or rebound.     Hernia: No hernia is present.     Comments: Epigastric tenderness and had a few episodes of the pain that came and went during visit  Musculoskeletal:        General: No tenderness.     Right lower leg: No edema.     Left lower leg: No edema.  Skin:     General: Skin is warm and dry.     Coloration: Skin is pale.  Neurological:     Mental Status: She is alert.     Cranial Nerves: Cranial nerve deficit present.     Motor: Weakness present.     Comments: Speech improved considerably from last visit thought remains dysarthric; left flaccid hemiparesis and facial droop   Psychiatric:        Mood and Affect: Mood normal.     Labs reviewed: Recent Labs    11/18/19 0343 11/19/19 0240 11/21/19 0308 11/21/19 0308 11/22/19 0502 11/23/19 0356 11/25/19 0000  NA 139   < > 147*   < > 144 141 145  K 4.4   < > 3.6   < > 3.2* 3.8 4.0  CL 108   < > 112*   < > 112* 110 107  CO2 12*   < > 27   < > 25 23 22   GLUCOSE 123*   < > 110*  --  113* 102*  --   BUN 21   < > 29*   < > 25* 17 32*  CREATININE 0.62   < > 0.65   < > 0.60 0.56 0.5  CALCIUM 9.1   < > 9.2   < > 8.8* 8.8* 9.4  MG 2.0  --  2.2  --   --  1.8  --   PHOS  --   --  2.8  --   --   --   --    < > = values in this interval not displayed.   Recent Labs    11/20/19 0332 11/21/19 0308 11/22/19 0502  AST 30 26 22   ALT 66* 51* 37  ALKPHOS 80 77 66  BILITOT 0.6 0.5 0.4  PROT 5.7* 5.7* 5.0*  ALBUMIN 2.3* 2.4* 2.1*   Recent Labs    11/21/19 0308 11/21/19 0308 11/22/19 0502 11/23/19 0356 11/25/19 0000  WBC 12.9*   < > 13.9* 13.0* 13.3  NEUTROABS 10.2*  --  11.1* 10.5*  --   HGB 14.0   < > 12.4  13.0 13.3  HCT 44.6   < > 40.0 40.2 41  MCV 98.0  --  98.8 96.6  --   PLT 442*   < > 411* 431* 455*   < > = values in this interval not displayed.   Lab Results  Component Value Date   TSH 1.728 11/10/2019   Lab Results  Component Value Date   HGBA1C 5.1 11/11/2019   Lab Results  Component Value Date   CHOL 196 11/11/2019   HDL 65 11/11/2019   LDLCALC 115 (H) 11/11/2019   LDLDIRECT 116.0 04/27/2019   TRIG 78 11/11/2019   CHOLHDL 3.0 11/11/2019   KUB 11/18/19 no obstruction or ileus RUQ limited US 11/18/19 normal  Assessment/Plan 1. Epigastric abdominal pain pepcid  once now Begin pantoprazole daily before evening meal Cbc, bmp, hep function, amylase, lipase stat CT abdomen/pelvis with oral contrast only due to epigastric abdominal pain (negative kub, [place]) Is newly on plavix and asa since stroke so may be developing an ulcer with no PPI and spending a lot of time lying in bed--I'm concerned she's developing gastritis vs ulcer  2. 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 Combined receptive and expressive aphasia due to acute stroke [facility]) -ongoing, but some improvement clearly notable today  4. Dysphagia following cerebrovascular accident (CVA) -they plan to continue ST for her in another facility  Family/ staff Communication: discussed with her sister and her mother who were visiting  Labs/tests ordered:  As above  Reino Lybbert L. Shaindel Sweeten, D.O. [facility] [address], [place] [address] Cell Phone (Mon-Fri 8am-5pm):  [phone] On Call:  [phone] & follow prompts after 5pm & weekends Office Phone:  [phone] Office Fax:  [phone]

## 2020-01-04 ENCOUNTER — Encounter: Payer: Self-pay | Admitting: Internal Medicine

## 2020-01-04 ENCOUNTER — Non-Acute Institutional Stay (SKILLED_NURSING_FACILITY): Payer: Medicare Other | Admitting: Internal Medicine

## 2020-01-04 DIAGNOSIS — I6359 Cerebral infarction due to unspecified occlusion or stenosis of other cerebral artery: Secondary | ICD-10-CM | POA: Diagnosis not present

## 2020-01-04 DIAGNOSIS — R1013 Epigastric pain: Secondary | ICD-10-CM | POA: Diagnosis not present

## 2020-01-04 DIAGNOSIS — D72829 Elevated white blood cell count, unspecified: Secondary | ICD-10-CM | POA: Diagnosis not present

## 2020-01-04 NOTE — Progress Notes (Signed)
Location:    [facility] Room Number: 107/P Place of Service:  SNF [phone]) Provider:  Ileene Hutchinson, MD  Patient Care Team: Marin Olp, MD as PCP - General (Family Medicine) Janne Lab, MD (Obstetrics and Gynecology) Wylene Simmer, MD as Consulting Physician (Orthopedic Surgery) Cameron Sprang, MD as Consulting Physician (Neurology)  Extended Emergency Contact Information Primary Emergency Contact: [facility] Address: [address]          [address]          [place], [place] [address] [place] Phone: [phone] Mobile Phone: [phone] Relation: Mother Secondary Emergency Contact: sachi, boulay Mobile Phone: [phone] Relation: Brother  Code Status:  Full Code Goals of care: Advanced Directive information Advanced Directives 01/04/2020  Does Patient Have a Medical Advance Directive? Yes  Type of Advance Directive (No Data)  Does patient want to make changes to medical advance directive? No - Patient declined  Would patient like information on creating a medical advance directive? -  Pre-existing out of facility DNR order (yellow form or pink MOST form) -     Chief complaint acute visit follow-up abdominal pain  HPI:  Pt is a 74 y.o. female seen today for an acute visit for follow-up of somewhat chronic abdominal pain. She is here status post CVA that resulted in left-sided hemiparesis and dysphagia as well as facial droop and dysarthria.  She was seen late last week by Dr. Mariea Clonts for continued epigastric abdominal pain.  She has had a previous KUB that showed an ileus at one point that appears to have cleared up.  She also had a right upper quadrant ultrasound and leukocytosis-but ultrasound was negative for cholelithiasis  or choledocholithiasis  She does have somewhat of a mildly chronic elevated white count.  Dr. Mariea Clonts did order stat labs late last week that have come back showing  relative stability her amylase has gone up somewhat up to 163 previously was 128 in late May her lipase is now 50 it was 60 on the late May lab. \Function tests appear to be stable her ALT was 38 albumin was 3.2  Renal function appears stable with a creatinine of 0.38 BUN of 13.3.  Her white count continues to be somewhat elevated at 12,800.  She is not complaining of any fever chills increased cough congestion or dysuria-she recently was treated for UTI she does have an indwelling Foley catheter.  Dr. Mariea Clonts has ordered a CT scan of her abdomen and that is being set up.  She also prescribed a proton pump inhibitor for concerns that possibly she may be developing overlooking an ulcer-she is on Plavix and aspirin since her CVA.  Today she still continues to complain at times of some epigastric pain she does not state that this is persistent but it is the enough to certainly concern her.  Her appetite remains fairly poor staff and family is encouraging p.o. intake.  She does have a very supportive family.       Past Medical History:  Diagnosis Date   Anxiety    Aortic atherosclerosis ([facility])    Arthritis    Complication of anesthesia    per pt, slow to wake up past sedation.   CVA (cerebral vascular accident) ([facility]) 11/10/2019   Depression    Hyperplastic colon polyp    Hypertension    Neuropathy    Osteoarthritis    Osteoporosis    Stomach upset  has a senstive stomach   Throat clearing    has a cough at times   Urinary incontinence    Vitamin D deficiency    Past Surgical History:  Procedure Laterality Date   COLONOSCOPY     FACIAL COSMETIC SURGERY     over 15 years ago   FINGER TENDON REPAIR     middle rt hand-   HAMMER TOE SURGERY  12/27/2011   Procedure: HAMMER TOE CORRECTION;  Surgeon: Wylene Simmer, MD;  Location: [facility];  Service: Orthopedics;  Laterality: Left;  2-4th    Allergies  Allergen Reactions   Chlorhexidine      Outpatient Encounter Medications as of 01/04/2020  Medication Sig   acetaminophen (TYLENOL) 325 MG tablet Take 650 mg by mouth every 6 (six) hours as needed.   aspirin 81 MG tablet Take 81 mg by mouth daily.   atorvastatin (LIPITOR) 40 MG tablet Take 1 tablet (40 mg total) by mouth daily.   bisacodyl (BISACODYL) 5 MG EC tablet Take 5 mg by mouth daily as needed for mild constipation or moderate constipation.   bisacodyl (DULCOLAX) 10 MG suppository If not relieved by MOM, give 10 mg Bisacodyl suppositiory rectally X 1 dose in 24 hours as needed (Do not use constipation standing orders for residents with renal failure/CFR less than 30. Contact MD for orders) (Physician Order)   clopidogrel (PLAVIX) 75 MG tablet Take 75 mg by mouth daily.   hydrocortisone cream 1 % Apply 1 application topically 2 (two) times daily.   magnesium hydroxide (MILK OF MAGNESIA) 400 MG/5ML suspension If no BM in 3 days, give 30 cc Milk of Magnesium p.o. x 1 dose in 24 hours as needed (Do not use standing constipation orders for residents with renal failure CFR less than 30. Contact MD for orders) (Physician Order)   metoprolol tartrate (LOPRESSOR) 25 MG tablet Take 1 tablet (25 mg total) by mouth 2 (two) times daily.   NON FORMULARY Diet: Mech Soft, no breads. Thin Liquids   NON FORMULARY 4oz Medpass d/t Dx Malnutrition/poor intake.   omeprazole (PRILOSEC) 20 MG capsule Take 20 mg by mouth daily. Give 1 tablet by mouth before evening med daily for GERD.   simethicone (MYLICON) 80 MG chewable tablet Chew 80 mg by mouth every 8 (eight) hours as needed for flatulence.    Sodium Phosphates (RA SALINE ENEMA RE) If not relieved by Biscodyl suppository, give disposable Saline Enema rectally X 1 dose/24 hrs as needed (Do not use constipation standing orders for residents with renal failure/CFR less than 30. Contact MD for orders)(Physician Or   [DISCONTINUED] pantoprazole (PROTONIX) 40 MG tablet Take 1 tablet (40  mg total) by mouth daily.   No facility-administered encounter medications on file as of 01/04/2020.    Review of Systems   General she is not complaining of fever or chills.  Skin does not complain of rashes or itching or diaphoresis.  Head ears eyes nose mouth and throat does not complain of visual changes or sore throat.  Respiratory does not complain of being increasingly short of breath or having a cough.  Cardiac does not complain of chest pain or increasing edema.  GI does complain of some abdominal pain this continues to be somewhat more intermittent and epigastric.  Does not complain of nausea or vomiting.  GU does have an indwelling Foley catheter but denies dysuria recently treated for UTI.  Musculoskeletal is positive for left-sided hemiparesis status post CVA does not really complain  of joint pain.  Neurologic is positive for dysarthria with history of CVA and left-sided hemiparesis-this has been baseline.  And psych does not complain of being overtly depressed or anxious he does have some memory loss.    Immunization History  Administered Date(s) Administered   Fluad Quad(high Dose 65+) 04/27/2019   Influenza, High Dose Seasonal PF 05/03/2017, 05/19/2018   Influenza,inj,Quad PF,6+ Mos 04/29/2013, 05/18/2014, 05/02/2015, 04/17/2016   PFIZER SARS-COV-2 Vaccination 08/27/2019, 09/21/2019   Pneumococcal Conjugate-13 05/11/2013   Pneumococcal Polysaccharide-23 05/02/2015   Pertinent  Health Maintenance Due  Topic Date Due   MAMMOGRAM  01/09/2019   DEXA SCAN  12/26/2019   INFLUENZA VACCINE  02/21/2020   COLONOSCOPY  03/11/2024   PNA vac Low Risk Adult  Completed   Fall Risk  12/07/2019 04/27/2019 08/05/2018 06/17/2018 12/25/2017  Falls in the past year? 0 1 0 0 No  Number falls in past yr: 0 0 - 0 -  Injury with Fall? 0 1 - 0 -   Functional Status Survey:    Vitals:   01/04/20 1200  BP: (!) 141/81  Pulse: 71  Resp: 19  Temp: (!) 97.3 F (36.3  C)  TempSrc: Oral  SpO2: 96%  Weight: 113 lb 12.8 oz (51.6 kg)  Height: 5\' 3"  (1.6 m)   Body mass index is 20.16 kg/m. Physical Exam In general this is a pleasant elderly female in no distress-she is eating some lunch but appears to have a poor appetite.  Eyes visual acuity appears grossly intact sclera and conjunctive are clear.  Oropharynx clear mucous membranes appear fairly moist.  Chest is clear to auscultation there is no labored breathing.  Heart is regular rate and rhythm without murmur gallop or rub she does not have significant lower extremity edema.  Abdomen is protuberant it is soft has somewhat diffuse tenderness to palpation I would classify this is mainly epigastric but somewhat difficult to localize.  GU does have an indwelling Foley catheter draining amber-colored urine.  Musculoskeletal does have left-sided hemiparesis at baseline does not have significant lower extremity edema.  Neurologic as noted above she does have some left-sided facial droop and dysarthria but her speech gradually appears to be improving.  Psych she is pleasant appropriate Labs reviewed:  January 01, 2020.  White count 12.8 hemoglobin 14.0 platelets 318.  Sodium 141 potassium 3.7 BUN 13.3 creatinine 0.38.  Amylase 163.  Lipase 79.  ALT was 38-.  Albumin was 3.2.  Otherwise liver function tests were within normal limits.  .  Assessment and plan.  1.  Abdominal pain-as noted above CT scan of the abdomen is pending-she has been started on Prilosec will switch this to Protonix 40 mg daily I did discuss this with Dr. Mariea Clonts via phone.  Lab work did not show any acute changes although amylase and lipase are slowly rising.  There is concern possibly for developing an ulcer with her being on Plavix and aspirin with her CVA.  At this point continue to encourage p.o. intake await results of CT scan and monitor clinically for any changes-hopefully Protonix will be of some  benefit.  2.  History of CVA with left-sided hemiparesis her speech appears to be gradually improving she continues on aspirin and Plavix.  3.  Leukocytosis there appears to have some chronicity here she is not complaining of any fever chills increased cough congestion or dysuria or diarrhea-at this point continue to monitor.  Of note apparently there are plans for her to transfer to [facility]  at some point midweek-she will be followed by Dr. Mariea Clonts  We will continue to monitor  [phone]   ADDITIONAL Recent Labs    11/18/19 0343 11/19/19 0240 11/21/19 0308 11/21/19 0308 11/22/19 0502 11/23/19 0356 11/25/19 0000  NA 139   < > 147*   < > 144 141 145  K 4.4   < > 3.6   < > 3.2* 3.8 4.0  CL 108   < > 112*   < > 112* 110 107  CO2 12*   < > 27   < > 25 23 22   GLUCOSE 123*   < > 110*  --  113* 102*  --   BUN 21   < > 29*   < > 25* 17 32*  CREATININE 0.62   < > 0.65   < > 0.60 0.56 0.5  CALCIUM 9.1   < > 9.2   < > 8.8* 8.8* 9.4  MG 2.0  --  2.2  --   --  1.8  --   PHOS  --   --  2.8  --   --   --   --    < > = values in this interval not displayed.   Recent Labs    11/20/19 0332 11/21/19 0308 11/22/19 0502  AST 30 26 22   ALT 66* 51* 37  ALKPHOS 80 77 66  BILITOT 0.6 0.5 0.4  PROT 5.7* 5.7* 5.0*  ALBUMIN 2.3* 2.4* 2.1*   Recent Labs    11/21/19 0308 11/21/19 0308 11/22/19 0502 11/23/19 0356 11/25/19 0000  WBC 12.9*   < > 13.9* 13.0* 13.3  NEUTROABS 10.2*  --  11.1* 10.5*  --   HGB 14.0   < > 12.4 13.0 13.3  HCT 44.6   < > 40.0 40.2 41  MCV 98.0  --  98.8 96.6  --   PLT 442*   < > 411* 431* 455*   < > = values in this interval not displayed.   Lab Results  Component Value Date   TSH 1.728 11/10/2019   Lab Results  Component Value Date   HGBA1C 5.1 11/11/2019   Lab Results  Component Value Date   CHOL 196 11/11/2019   HDL 65 11/11/2019   LDLCALC 115 (H) 11/11/2019   LDLDIRECT 116.0 04/27/2019   TRIG 78 11/11/2019   CHOLHDL 3.0 11/11/2019

## 2020-01-05 ENCOUNTER — Encounter: Payer: Self-pay | Admitting: Internal Medicine

## 2020-01-05 ENCOUNTER — Encounter: Payer: Self-pay | Admitting: Family Medicine

## 2020-01-06 ENCOUNTER — Encounter: Payer: Self-pay | Admitting: Internal Medicine

## 2020-01-06 ENCOUNTER — Non-Acute Institutional Stay (SKILLED_NURSING_FACILITY): Payer: Medicare Other | Admitting: Internal Medicine

## 2020-01-06 DIAGNOSIS — R339 Retention of urine, unspecified: Secondary | ICD-10-CM | POA: Diagnosis not present

## 2020-01-06 DIAGNOSIS — I10 Essential (primary) hypertension: Secondary | ICD-10-CM

## 2020-01-06 DIAGNOSIS — I6359 Cerebral infarction due to unspecified occlusion or stenosis of other cerebral artery: Secondary | ICD-10-CM

## 2020-01-06 DIAGNOSIS — R1013 Epigastric pain: Secondary | ICD-10-CM

## 2020-01-06 DIAGNOSIS — D72829 Elevated white blood cell count, unspecified: Secondary | ICD-10-CM

## 2020-01-06 MED ORDER — ATORVASTATIN CALCIUM 40 MG PO TABS: 40.0000 mg | ORAL_TABLET | Freq: Every day | ORAL | 0 refills | Status: AC

## 2020-01-06 MED ORDER — SIMETHICONE 80 MG PO CHEW: 80.0000 mg | CHEWABLE_TABLET | Freq: Three times a day (TID) | ORAL | 0 refills | Status: DC | PRN

## 2020-01-06 MED ORDER — PANTOPRAZOLE SODIUM 40 MG PO TBEC: 40.0000 mg | DELAYED_RELEASE_TABLET | Freq: Every day | ORAL | 0 refills | Status: DC

## 2020-01-06 MED ORDER — CLOPIDOGREL BISULFATE 75 MG PO TABS: 75.0000 mg | ORAL_TABLET | Freq: Every day | ORAL | 0 refills | Status: AC

## 2020-01-06 MED ORDER — METOPROLOL TARTRATE 25 MG PO TABS: 25.0000 mg | ORAL_TABLET | Freq: Two times a day (BID) | ORAL | 0 refills | Status: DC

## 2020-01-06 NOTE — Progress Notes (Signed)
Location:    [place] Room Number: 107/P Place of Service:  SNF [phone])  Provider: Full Code  PCP: Marin Olp, MD Patient Care Team: Marin Olp, MD as PCP - General (Family Medicine) Janne Lab, MD (Obstetrics and Gynecology) Wylene Simmer, MD as Consulting Physician (Orthopedic Surgery) Cameron Sprang, MD as Consulting Physician (Neurology)  Extended Emergency Contact Information Primary Emergency Contact: [facility] Address: [address]          [address]          [place], [place] [address] [place] Phone: [phone] Mobile Phone: [phone] Relation: Mother Secondary Emergency Contact: tasneem, cormier Mobile Phone: [phone] Relation: Brother  Code Status: Full Code Goals of care:  Advanced Directive information Advanced Directives 01/06/2020  Does Patient Have a Medical Advance Directive? Yes  Type of Advance Directive (No Data)  Does patient want to make changes to medical advance directive? No - Patient declined  Would patient like information on creating a medical advance directive? -     Allergies  Allergen Reactions  . Chlorhexidine     Chief Complaint  Patient presents with  . Discharge Note    Discharge Visit    HPI:  74 y.o. female seen today for discharge from facility.  He is slated to be transferred to [facility] later this week.  She was here for rehab status post CVA that resulted in left-sided hemiparesis and dysphagia as well as a facial droop and dysarthria.  Rehab course continues to be somewhat complicated and challenging since  CVA was quite significant.  She has made some improvement.  Most recent acute issue has been continued epigastric abdominal pain.  Previous KUB showed an ileus at one point that appears to have resolved.  She also had a right upper quadrant ultrasound and leukocytosis but ultrasound was negative for cholelithiasis or  choledocholithiasis  Her white count has been somewhat chronically elevated but does not really show evidence of chest congestion or shortness of breath diarrhea or dysuria.  She did complete a course of antibiotics for recent UTI.-And she has been started on Protonix for concerns of possibly development of an ulcer causing her abdominal discomfort-she is on Plavix and aspirin secondary to her CVA  In regards to lab work she has had somewhat elevated amylase and lipase with an amylase level of 163 and lipase 79.  There is some recent chronicity to this as well.  A CT of the abdomen has been ordered and is pending.  Currently she is lying in bed comfortably still complains of some abdominal discomfort she says this has not gotten any worse-she is trying to eat and drink better although this continues to be somewhat of a challenge.       Past Medical History:  Diagnosis Date  . Anxiety   . Aortic atherosclerosis ([place])   . Arthritis   . Complication of anesthesia    per pt, slow to wake up past sedation.  . CVA (cerebral vascular accident) ([place]) 11/10/2019  . Depression   . Hyperplastic colon polyp   . Hypertension   . Neuropathy   . Osteoarthritis   . Osteoporosis   . Stomach upset    has a senstive stomach  . Throat clearing    has a cough at times  . Urinary incontinence   . Vitamin D deficiency     Past Surgical History:  Procedure Laterality Date  . COLONOSCOPY    .  FACIAL COSMETIC SURGERY     over 15 years ago  . FINGER TENDON REPAIR     middle rt hand-  . HAMMER TOE SURGERY  12/27/2011   Procedure: HAMMER TOE CORRECTION;  Surgeon: Wylene Simmer, MD;  Location: [facility];  Service: Orthopedics;  Laterality: Left;  2-4th      reports that she quit smoking about 44 years ago. She quit after 10.00 years of use. She has never used smokeless tobacco. She reports current alcohol use. She reports that she does not use drugs. Social History    Socioeconomic History  . Marital status: Divorced    Spouse name: Not on file  . Number of children: 1  . Years of education: 77  . Highest education level: Not on file  Occupational History  . Occupation: retired  Tobacco Use  . Smoking status: Former Smoker    Years: 10.00    Quit date: 12/21/1975    Years since quitting: 44.0  . Smokeless tobacco: Never Used  Vaping Use  . Vaping Use: Never used  Substance and Sexual Activity  . Alcohol use: Yes    Comment: rarely  . Drug use: No  . Sexual activity: Not Currently  Other Topics Concern  . Not on file  Social History Narrative   Divorced. Children: 1 son. Patient supports her mother- mother lives with her- 2 story home.       Retired from [employer]  Education: college.       Hobbies: riding horses in the past, family time, enjoys some gardening   Social Determinants of Health   Financial Resource Strain:   . Difficulty of Paying Living Expenses:   Food Insecurity:   . Worried About Charity fundraiser in the Last Year:   . Arboriculturist in the Last Year:   Transportation Needs:   . Film/video editor (Medical):   Marland Kitchen Lack of Transportation (Non-Medical):   Physical Activity:   . Days of Exercise per Week:   . Minutes of Exercise per Session:   Stress:   . Feeling of Stress :   Social Connections:   . Frequency of Communication with Friends and Family:   . Frequency of Social Gatherings with Friends and Family:   . Attends Religious Services:   . Active Member of Clubs or Organizations:   . Attends Archivist Meetings:   Marland Kitchen Marital Status:   Intimate Partner Violence:   . Fear of Current or Ex-Partner:   . Emotionally Abused:   Marland Kitchen Physically Abused:   . Sexually Abused:    Functional Status Survey:    Allergies  Allergen Reactions  . Chlorhexidine     Pertinent  Health Maintenance Due  Topic Date Due  . MAMMOGRAM  01/09/2019  . DEXA SCAN  12/26/2019  . INFLUENZA  VACCINE  02/21/2020  . COLONOSCOPY  03/11/2024  . PNA vac Low Risk Adult  Completed    Medications: Allergies as of 01/06/2020      Reactions   Chlorhexidine       Medication List       Accurate as of January 06, 2020  2:00 PM. If you have any questions, ask your nurse or doctor.        STOP taking these medications   omeprazole 20 MG capsule Commonly known as: PRILOSEC Stopped by: Granville Lewis, PA-C     TAKE these medications   acetaminophen 325 MG tablet Commonly known as:  TYLENOL Take 650 mg by mouth every 6 (six) hours as needed.   aspirin 81 MG tablet Take 81 mg by mouth daily.   atorvastatin 40 MG tablet Commonly known as: LIPITOR Take 1 tablet (40 mg total) by mouth daily.   bisacodyl 5 MG EC tablet Generic drug: bisacodyl Take 5 mg by mouth daily as needed for mild constipation or moderate constipation.   bisacodyl 10 MG suppository Commonly known as: DULCOLAX If not relieved by MOM, give 10 mg Bisacodyl suppositiory rectally X 1 dose in 24 hours as needed (Do not use constipation standing orders for residents with renal failure/CFR less than 30. Contact MD for orders) (Physician Order)   clopidogrel 75 MG tablet Commonly known as: PLAVIX Take 75 mg by mouth daily.   hydrocortisone cream 1 % Apply 1 application topically 2 (two) times daily.   magnesium hydroxide 400 MG/5ML suspension Commonly known as: MILK OF MAGNESIA If no BM in 3 days, give 30 cc Milk of Magnesium p.o. x 1 dose in 24 hours as needed (Do not use standing constipation orders for residents with renal failure CFR less than 30. Contact MD for orders) (Physician Order)   metoprolol tartrate 25 MG tablet Commonly known as: LOPRESSOR Take 1 tablet (25 mg total) by mouth 2 (two) times daily.   NON FORMULARY 4oz Medpass d/t Dx Malnutrition/poor intake.   NON FORMULARY Diet: Mech Soft, no breads. Thin Liquids   pantoprazole 40 MG tablet Commonly known as: PROTONIX Take 40 mg by mouth  daily.   RA SALINE ENEMA RE If not relieved by Biscodyl suppository, give disposable Saline Enema rectally X 1 dose/24 hrs as needed (Do not use constipation standing orders for residents with renal failure/CFR less than 30. Contact MD for orders)(Physician Or   simethicone 80 MG chewable tablet Commonly known as: MYLICON Chew 80 mg by mouth every 8 (eight) hours as needed for flatulence.       Review of Systems   General she is not complaining of fever or chills.  Skin does not complain of rashes or itching.  Head ears eyes nose mouth and throat does not complain of a sore throat or visual changes.  Respiratory is not complaining of a cough or shortness of breath.  Cardiac does not complain of increasing edema or increased chest pain.  GI continues to complain of some abdominal discomfort does not really complain of nausea or vomiting.  GU does have an indwelling Foley catheter is not complaining of any dysuria.  Musculoskeletal continues with significant left-sided hemiparesis-is not really complaining of musculoskeletal pain today.  Neurologic as noted above she does have dysarthria and hemiparesis on the left she is not complaining of feeling dizzy or having a headache or having syncope.  And psych does not complain of overt depression or anxiety she does have some mild memory loss.    Vitals:   01/06/20 1355  BP: 129/68  Pulse: 63  Resp: 18  Temp: 98.2 F (36.8 C)  TempSrc: Oral  SpO2: 96%  Weight: 112 lb 9.6 oz (51.1 kg)  Height: 5\' 3"  (1.6 m)   Body mass index is 19.95 kg/m. Physical Exam  In general this is a pleasant elderly female no distress lying comfortably in bed.  Her skin is warm and dry.  Eyes visual acuity appears grossly intact sclera and conjunctive are clear.  Oropharynx is clear.   Chest is clear to auscultation she does not have any labored breathing.  Heart is regular rate  and rhythm without murmur gallop or rub she does not really  have any significant lower extremity edema.  Her abdomen is soft continues to have some tenderness this again appears to be more in the epigastric area-her abdomen is soft she does have positive bowel sounds.  GU has an indwelling Foley catheter draining amber-colored urine.  Musculoskeletal notable for left-sided continued hemiparesis does move her right extremities it appears at relative baseline-limited exam since she is in bed.  Neurologic as noted above positive for left-sided hemiparesis with a facial droop and dysarthria-.  Psych she continues to be pleasant and appropriate does have a history of some mild cognitive deficits.       Labs reviewed: Basic Metabolic Panel: Recent Labs    11/18/19 0343 11/19/19 0240 11/21/19 0308 11/21/19 0308 11/22/19 0502 11/22/19 0502 11/23/19 0356 11/23/19 0356 11/25/19 0000 11/25/19 0000 12/08/19 0400 12/17/19 0500 01/01/20 1255  NA 139   < > 147*   < > 144   < > 141  --  145   < > 138 136* 141  K 4.4   < > 3.6   < > 3.2*   < > 3.8   < > 4.0   < > 4.1 3.6 3.7  CL 108   < > 112*   < > 112*   < > 110   < > 107   < > 102 102 105  CO2 12*   < > 27   < > 25   < > 23   < > 22   < > 24* 21 21  GLUCOSE 123*   < > 110*  --  113*  --  102*  --   --   --   --   --   --   BUN 21   < > 29*   < > 25*   < > 17  --  32*   < > 20 15 13   CREATININE 0.62   < > 0.65   < > 0.60   < > 0.56  --  0.5  --   --  0.4* 0.4*  CALCIUM 9.1   < > 9.2   < > 8.8*   < > 8.8*   < > 9.4   < > 9.6 9.0 9.9  MG 2.0  --  2.2  --   --   --  1.8  --   --   --   --   --   --   PHOS  --   --  2.8  --   --   --   --   --   --   --   --   --   --    < > = values in this interval not displayed.   Liver Function Tests: Recent Labs    11/20/19 0332 11/20/19 0332 11/21/19 0308 11/21/19 0308 11/22/19 0502 12/17/19 0500 12/24/19 1255  AST 30   < > 26   < > 22 22 23   ALT 66*   < > 51*   < > 37 28 38*  ALKPHOS 80  --  77  --  66  --   --   BILITOT 0.6  --  0.5  --  0.4   --   --   PROT 5.7*  --  5.7*  --  5.0*  --   --   ALBUMIN 2.3*   < > 2.4*   < >  2.1* 3.1* 3.2*   < > = values in this interval not displayed.   No results for input(s): LIPASE, AMYLASE in the last 8760 hours. Recent Labs    11/14/19 0057 11/19/19 0240  AMMONIA 17 19   CBC: Recent Labs    11/21/19 0308 11/21/19 0308 11/22/19 0502 11/22/19 0502 11/23/19 0356 11/25/19 0000 12/08/19 0400 12/17/19 0500 01/01/20 1255  WBC 12.9*   < > 13.9*   < > 13.0*   < > 10.8 11.2 12.8  NEUTROABS 10.2*  --  11.1*  --  10.5*  --   --   --   --   HGB 14.0   < > 12.4   < > 13.0   < > 13.2 12.6 14.0  HCT 44.6   < > 40.0   < > 40.2   < > 40 38 43  MCV 98.0  --  98.8  --  96.6  --   --   --   --   PLT 442*   < > 411*   < > 431*   < > 9* 319 318   < > = values in this interval not displayed.   Cardiac Enzymes: Recent Labs    11/20/19 0332  CKTOTAL 35*   BNP: Invalid input(s): POCBNP CBG: Recent Labs    11/10/19 0950 11/12/19 1347 11/13/19 2237  GLUCAP 76 69* 127*    Procedures and Imaging Studies During Stay: CARDIAC EVENT MONITOR  Result Date: 01/03/2020 Sinus rhythm No AV block or pauses No ventricular arrhythmias No afib   Assessment/Plan:    #1 history of abdominal pain again CT scan is pending this will need follow-up at [facility]-Dr. Mariea Clonts will be her primary care provider there as well This appears to be relatively unchanged recently-she is attempting to eat and drink better but this continues to be a challenge.  She has been started on Protonix 40 mg a day for concerns of possible ulcer with her being on Plavix and aspirin with her history of CVA.  2.  History of CVA as noted above she is on aspirin and Plavix as well as atorvastatin LDL was 115 in the hospital she is on atorvastatin 40 mg a day-she will need continued PT OT and speech therapy.  3.  History of small avulsion fracture right ankle-recommendation for supportive shoe when ambulating she apparently will have  follow-up as well by orthopedics.  4.  History of hypertension at this point appears stable on Lopressor 25 mg twice daily recent blood pressures 129/68-117/69-129/80.  5.  History of urinary retention she continues with a chronic indwelling Foley catheter-she has completed treatment for UTI during her stay here-she is not complaining of any new dysuria-I suspect she would benefit from follow-up by urology.  6.  History of hypokalemia in hospital this was replenished and appears to be stable potassium was 3.7 on lab done on June 11.  7.  Leukocytosis Per lab review this appears to have some chronicity she does not really complain of any dysuria or fever chills increased cough or congestion or diarrhea-we will continue to be monitored at her new facility  Again she will be going to [facility] will have follow-up by Dr. Mariea Clonts there   [phone] note greater than 30 minutes spent assessing patient-reviewing her chart and labs-and coordinating and formulating a plan of care for numerous diagnoses-of note greater than 50% of time spent coordinating a plan of care with input as noted above

## 2020-01-09 ENCOUNTER — Encounter: Payer: Self-pay | Admitting: Internal Medicine

## 2020-01-11 ENCOUNTER — Other Ambulatory Visit: Payer: Self-pay | Admitting: Internal Medicine

## 2020-01-11 ENCOUNTER — Other Ambulatory Visit (HOSPITAL_COMMUNITY): Payer: Self-pay | Admitting: Internal Medicine

## 2020-01-12 ENCOUNTER — Encounter: Payer: Self-pay | Admitting: Internal Medicine

## 2020-01-12 ENCOUNTER — Other Ambulatory Visit: Payer: Self-pay | Admitting: Internal Medicine

## 2020-01-12 ENCOUNTER — Non-Acute Institutional Stay (SKILLED_NURSING_FACILITY): Payer: Medicare Other | Admitting: Internal Medicine

## 2020-01-12 DIAGNOSIS — R339 Retention of urine, unspecified: Secondary | ICD-10-CM | POA: Diagnosis not present

## 2020-01-12 DIAGNOSIS — I69391 Dysphagia following cerebral infarction: Secondary | ICD-10-CM

## 2020-01-12 DIAGNOSIS — I639 Cerebral infarction, unspecified: Secondary | ICD-10-CM

## 2020-01-12 DIAGNOSIS — I69354 Hemiplegia and hemiparesis following cerebral infarction affecting left non-dominant side: Secondary | ICD-10-CM

## 2020-01-12 DIAGNOSIS — R1013 Epigastric pain: Secondary | ICD-10-CM | POA: Diagnosis not present

## 2020-01-12 DIAGNOSIS — R4701 Aphasia: Secondary | ICD-10-CM

## 2020-01-12 DIAGNOSIS — M25551 Pain in right hip: Secondary | ICD-10-CM

## 2020-01-12 NOTE — Progress Notes (Signed)
Patient ID: [name], female   DOB: 06-Sep-1945, 74 y.o.   MRN: [id number]  Provider:  Rexene Edison. Mariea Clonts, D.O., C.M.D. Location:  [facility] Room Number: 147A Place of Service:  SNF (31)  PCP: Gayland Curry, DO Patient Care Team: Gayland Curry, DO as PCP - General (Geriatric Medicine) Janne Lab, MD (Obstetrics and Gynecology) Wylene Simmer, MD as Consulting Physician (Orthopedic Surgery) Cameron Sprang, MD as Consulting Physician (Neurology)  Extended Emergency Contact Information Primary Emergency Contact: [facility] Address: [address]          [address]          [place], [place] [address] [place] Phone: [phone] Mobile Phone: [phone] Relation: Mother Secondary Emergency Contact: alissia, lory Mobile Phone: [phone] Relation: Brother  Code Status: full code Goals of Care: Advanced Directive information Advanced Directives 01/12/2020  Does Patient Have a Medical Advance Directive? No  Type of Advance Directive -  Does patient want to make changes to medical advance directive? No - Patient declined  Would patient like information on creating a medical advance directive? -   Chief Complaint  Patient presents with  . New Admit To SNF    New Admission to SNF    HPI: Patient is a 74 y.o. female seen today for [name]  [name] had been receiving PT, OT, ST there.  She has left hemiparesis s/p stroke and significant aphasia.  She's also had urinary retention and has a foley catheter.  She's being transferred via hoyer lift.  She is using a manual wheelchair but reports that her right hip begins to hurt quickly when sitting up in it.  There is no cushion.  She also has been having intermittent epigastric pain.  She's had KUB, RUQ [place] and several sets of labs over this and they revealed a mild leukocytosis.  She was treated once for UTI.   She also had some mild pancreatic enzyme elevation.  I started her on a PPI (pantoprazole had been sent to pharmacy, but omeprazole was used despite her plavix) and now that she's here this was just changed.  I had ordered CT abdomen/pelvis, but this was not arranged at [facility] before her transfer but she now has this arranged.  She's had some slight improvements in her left side mobility and her speech, but I suspect she can make more strides with PT, OT, ST here to be a bit more independent at least using her wheelchair and to converse better.    When she arrived here, she had blanchable erythema of her buttocks and lower abdominal bruising from her lovenox.  She requires assistance with eating.    Past Medical History:  Diagnosis Date  . Anxiety   . Aortic atherosclerosis ([place])   . Arthritis   . Complication of anesthesia    per pt, slow to wake up past sedation.  . CVA (cerebral vascular accident) ([place]) 11/10/2019  . Depression   . Hyperplastic colon polyp   . Hypertension   . Neuropathy   . Osteoarthritis   . Osteoporosis   . Stomach upset    has a senstive stomach  . Throat clearing    has a cough at times  . Urinary incontinence   . Vitamin D deficiency    Past Surgical History:  Procedure Laterality Date  . COLONOSCOPY    . FACIAL COSMETIC SURGERY  over 15 years ago  . FINGER TENDON REPAIR     middle rt hand-  . HAMMER TOE SURGERY  12/27/2011   Procedure: HAMMER TOE CORRECTION;  Surgeon: Wylene Simmer, MD;  Location: [facility];  Service: Orthopedics;  Laterality: Left;  2-4th    Social History   Socioeconomic History  . Marital status: Divorced    Spouse name: Not on file  . Number of children: 1  . Years of education: 66  . Highest education level: Not on file  Occupational History  . Occupation: retired  Tobacco Use  . Smoking status: Former Smoker    Years: 10.00    Quit date: 12/21/1975    Years since quitting: 44.0  . Smokeless  tobacco: Never Used  Vaping Use  . Vaping Use: Never used  Substance and Sexual Activity  . Alcohol use: Yes    Comment: rarely  . Drug use: No  . Sexual activity: Not Currently  Other Topics Concern  . Not on file  Social History Narrative   Divorced. Children: 1 son. Patient supports her mother- mother lives with her- 2 story home.       Retired from [employer]  Education: college.       Hobbies: riding horses in the past, family time, enjoys some gardening   Social Determinants of Health   Financial Resource Strain:   . Difficulty of Paying Living Expenses:   Food Insecurity:   . Worried About Charity fundraiser in the Last Year:   . Arboriculturist in the Last Year:   Transportation Needs:   . Film/video editor (Medical):   Marland Kitchen Lack of Transportation (Non-Medical):   Physical Activity:   . Days of Exercise per Week:   . Minutes of Exercise per Session:   Stress:   . Feeling of Stress :   Social Connections:   . Frequency of Communication with Friends and Family:   . Frequency of Social Gatherings with Friends and Family:   . Attends Religious Services:   . Active Member of Clubs or Organizations:   . Attends Archivist Meetings:   Marland Kitchen Marital Status:     reports that she quit smoking about 44 years ago. She quit after 10.00 years of use. She has never used smokeless tobacco. She reports current alcohol use. She reports that she does not use drugs.  Functional Status Survey:    Family History  Problem Relation Age of Onset  . Arthritis Mother   . Colon cancer Father   . COPD Father 46       died at 6  . Throat cancer Brother   . Breast cancer Other   . Esophageal cancer Neg Hx   . Rectal cancer Neg Hx   . Stomach cancer Neg Hx   . Stroke Neg Hx     Health Maintenance  Topic Date Due  . MAMMOGRAM  01/09/2019  . COVID-19 Vaccine (2 - Pfizer 2-dose series) 10/12/2019  . DEXA SCAN  12/26/2019  . INFLUENZA VACCINE  02/21/2020  .  TETANUS/TDAP  09/20/2020  . COLONOSCOPY  03/11/2024  . Hepatitis C Screening  Completed  . PNA vac Low Risk Adult  Completed    Allergies  Allergen Reactions  . Chlorhexidine     Outpatient Encounter Medications as of 01/12/2020  Medication Sig  . acetaminophen (TYLENOL) 325 MG tablet Take 650 mg by mouth every 6 (six) hours as needed.  Marland Kitchen aspirin  81 MG tablet Take 81 mg by mouth daily.  Marland Kitchen atorvastatin (LIPITOR) 40 MG tablet Take 1 tablet (40 mg total) by mouth daily.  . bisacodyl (BISACODYL) 5 MG EC tablet Take 5 mg by mouth daily as needed for mild constipation or moderate constipation.  . bisacodyl (DULCOLAX) 10 MG suppository If not relieved by MOM, give 10 mg Bisacodyl suppositiory rectally X 1 dose in 24 hours as needed (Do not use constipation standing orders for residents with renal failure/CFR less than 30. Contact MD for orders) (Physician Order)  . clopidogrel (PLAVIX) 75 MG tablet Take 1 tablet (75 mg total) by mouth daily.  . hydrocortisone cream 1 % Apply 1 application topically 2 (two) times daily.  . magnesium hydroxide (MILK OF MAGNESIA) 400 MG/5ML suspension If no BM in 3 days, give 30 cc Milk of Magnesium p.o. x 1 dose in 24 hours as needed (Do not use standing constipation orders for residents with renal failure CFR less than 30. Contact MD for orders) (Physician Order)  . metoprolol tartrate (LOPRESSOR) 25 MG tablet Take 1 tablet (25 mg total) by mouth 2 (two) times daily.  . NON FORMULARY Diet: Mech Soft, no breads. Thin Liquids  . NON FORMULARY 4oz Medpass d/t Dx Malnutrition/poor intake.  Marland Kitchen omeprazole (PRILOSEC) 20 MG capsule Take 20 mg by mouth daily.  . simethicone (MYLICON) 80 MG chewable tablet Chew 1 tablet (80 mg total) by mouth every 8 (eight) hours as needed for flatulence.  . Sodium Phosphates (RA SALINE ENEMA RE) If not relieved by Biscodyl suppository, give disposable Saline Enema rectally X 1 dose/24 hrs as needed (Do not use constipation standing orders  for residents with renal failure/CFR less than 30. Contact MD for orders)(Physician Or  . [DISCONTINUED] pantoprazole (PROTONIX) 40 MG tablet Take 1 tablet (40 mg total) by mouth daily.   No facility-administered encounter medications on file as of 01/12/2020.    Review of Systems  Constitutional: Negative for chills and fever.  HENT: Negative for congestion, hearing loss and sore throat.   Eyes: Negative for blurred vision and double vision.  Respiratory: Negative for cough and shortness of breath.   Cardiovascular: Negative for chest pain, palpitations and leg swelling.  Gastrointestinal: Positive for abdominal pain. Negative for blood in stool, constipation, diarrhea, heartburn, melena, nausea and vomiting.  Genitourinary: Negative for dysuria.       Foley with yellow urine  Musculoskeletal: Positive for joint pain. Negative for falls.       Right hip hurting sitting up in wheelchair  Skin: Negative for itching and rash.  Neurological: Positive for sensory change, speech change and focal weakness. Negative for dizziness, tremors and loss of consciousness.  Psychiatric/Behavioral: Positive for memory loss. Negative for depression. The patient does not have insomnia.     Vitals:   01/12/20 1743  BP: 120/77  Pulse: 73  Resp: 18  Temp: (!) 97.5 F (36.4 C)  SpO2: 94%   There is no height or weight on file to calculate BMI. Physical Exam Vitals reviewed.  Constitutional:      General: She is not in acute distress.    Appearance: Normal appearance. She is not toxic-appearing.  HENT:     Head: Normocephalic and atraumatic.     Right Ear: External ear normal.     Left Ear: External ear normal.     Ears:     Comments: HOH    Nose: Nose normal.     Mouth/Throat:     Pharynx: Oropharynx  is clear.  Eyes:     Extraocular Movements: Extraocular movements intact.     Conjunctiva/sclera: Conjunctivae normal.     Pupils: Pupils are equal, round, and reactive to light.    Cardiovascular:     Rate and Rhythm: Normal rate and regular rhythm.     Pulses: Normal pulses.     Heart sounds: Normal heart sounds.  Pulmonary:     Effort: Pulmonary effort is normal.     Breath sounds: Normal breath sounds.  Abdominal:     General: Bowel sounds are normal. There is no distension.     Palpations: Abdomen is soft. There is no mass.     Tenderness: There is no abdominal tenderness. There is no guarding or rebound.  Musculoskeletal:        General: Tenderness present.     Cervical back: Neck supple.     Comments: Tender over right lateral hip; sitting up in wheelchair w/o a cushion  Lymphadenopathy:     Cervical: No cervical adenopathy.  Skin:    General: Skin is warm and dry.     Capillary Refill: Capillary refill takes less than 2 seconds.  Neurological:     Mental Status: She is alert.     Cranial Nerves: Cranial nerve deficit present.     Motor: Weakness present.     Gait: Gait abnormal.     Comments: Left flaccid hemiplegia; dysarthria and some aphasia; uses manual wheelchair  Psychiatric:        Mood and Affect: Mood normal.        Behavior: Behavior normal.     Labs reviewed: Basic Metabolic Panel: Recent Labs    11/18/19 0343 11/19/19 0240 11/21/19 0308 11/21/19 0308 11/22/19 0502 11/22/19 0502 11/23/19 0356 11/23/19 0356 11/25/19 0000 11/25/19 0000 12/08/19 0400 12/17/19 0500 01/01/20 1255  NA 139   < > 147*   < > 144   < > 141  --  145   < > 138 136* 141  K 4.4   < > 3.6   < > 3.2*   < > 3.8   < > 4.0   < > 4.1 3.6 3.7  CL 108   < > 112*   < > 112*   < > 110   < > 107   < > 102 102 105  CO2 12*   < > 27   < > 25   < > 23   < > 22   < > 24* 21 21  GLUCOSE 123*   < > 110*  --  113*  --  102*  --   --   --   --   --   --   BUN 21   < > 29*   < > 25*   < > 17  --  32*   < > 20 15 13   CREATININE 0.62   < > 0.65   < > 0.60   < > 0.56  --  0.5  --   --  0.4* 0.4*  CALCIUM 9.1   < > 9.2   < > 8.8*   < > 8.8*   < > 9.4   < > 9.6 9.0 9.9  MG  2.0  --  2.2  --   --   --  1.8  --   --   --   --   --   --   PHOS  --   --  2.8  --   --   --   --   --   --   --   --   --   --    < > =  values in this interval not displayed.   Liver Function Tests: Recent Labs    11/20/19 0332 11/20/19 0332 11/21/19 0308 11/21/19 0308 11/22/19 0502 12/17/19 0500 12/24/19 1255  AST 30   < > 26   < > 22 22 23   ALT 66*   < > 51*   < > 37 28 38*  ALKPHOS 80  --  77  --  66  --   --   BILITOT 0.6  --  0.5  --  0.4  --   --   PROT 5.7*  --  5.7*  --  5.0*  --   --   ALBUMIN 2.3*   < > 2.4*   < > 2.1* 3.1* 3.2*   < > = values in this interval not displayed.   No results for input(s): LIPASE, AMYLASE in the last 8760 hours. Recent Labs    11/14/19 0057 11/19/19 0240  AMMONIA 17 19   CBC: Recent Labs    11/21/19 0308 11/21/19 0308 11/22/19 0502 11/22/19 0502 11/23/19 0356 11/25/19 0000 12/08/19 0400 12/17/19 0500 01/01/20 1255  WBC 12.9*   < > 13.9*   < > 13.0*   < > 10.8 11.2 12.8  NEUTROABS 10.2*  --  11.1*  --  10.5*  --   --   --   --   HGB 14.0   < > 12.4   < > 13.0   < > 13.2 12.6 14.0  HCT 44.6   < > 40.0   < > 40.2   < > 40 38 43  MCV 98.0  --  98.8  --  96.6  --   --   --   --   PLT 442*   < > 411*   < > 431*   < > 9* 319 318   < > = values in this interval not displayed.   Cardiac Enzymes: Recent Labs    11/20/19 0332  CKTOTAL 35*   BNP: Invalid input(s): POCBNP Lab Results  Component Value Date   HGBA1C 5.1 11/11/2019   Lab Results  Component Value Date   TSH 1.728 11/10/2019   Lab Results  Component Value Date   OZHYQMVH84 696 05/09/2017   Lab Results  Component Value Date   FOLATE 12.9 05/09/2017   No results found for: IRON, TIBC, FERRITIN  Imaging and Procedures obtained prior to SNF admission: CT Code Stroke CTA Head W/WO contrast  Result Date: 11/10/2019 CLINICAL DATA:  Generalized weakness.  Encephalopathy. EXAM: CT ANGIOGRAPHY HEAD AND NECK CT PERFUSION BRAIN TECHNIQUE: Multidetector CT  imaging of the head and neck was performed using the standard protocol during bolus administration of intravenous contrast. Multiplanar CT image reconstructions and MIPs were obtained to evaluate the vascular anatomy. Carotid stenosis measurements (when applicable) are obtained utilizing NASCET criteria, using the distal internal carotid diameter as the denominator. Multiphase CT imaging of the brain was performed following IV bolus contrast injection. Subsequent parametric perfusion maps were calculated using RAPID software. CONTRAST:  36mL OMNIPAQUE IOHEXOL 350 MG/ML SOLN COMPARISON:  None. FINDINGS: CTA NECK FINDINGS Aortic arch: Standard 3 vessel aortic arch with minimal atherosclerotic plaque. Widely patent arch vessel origins. Right carotid system: Patent without evidence of stenosis, dissection, or significant atherosclerosis. Left carotid system: Patent without evidence of stenosis, dissection, or significant atherosclerosis. Vertebral arteries: Patent and codominant without evidence of stenosis, dissection, or significant atherosclerosis. Skeleton: Severe facet arthrosis at C3-4 and C4-5 with grade 1 anterolisthesis at both levels.  Severe disc degeneration at C5-6 and C6-7. Severe multilevel neural foraminal stenosis in the cervical spine. Other neck: No evidence of cervical lymphadenopathy or mass. Upper chest: No apical lung consolidation or mass. Review of the MIP images confirms the above findings CTA HEAD FINDINGS Anterior circulation: The internal carotid arteries are patent from skull base to carotid termini with minimal nonstenotic plaque bilaterally. ACAs and MCAs are patent without evidence of proximal branch occlusion or significant proximal stenosis. No aneurysm is identified. Posterior circulation: The intracranial vertebral arteries are widely patent to the basilar. Patent PICA, AICA, and SCA origins are identified bilaterally. Posterior communicating arteries are not clearly identified and  may be diminutive or absent. The PCAs are patent without evidence of significant proximal stenosis. No aneurysm is identified. Venous sinuses: As permitted by contrast timing, patent. Anatomic variants: None. Review of the MIP images confirms the above findings CT Brain Perfusion Findings: CBF (<30%) Volume: 0 mL Perfusion (Tmax>6.0s) volume: 0 mL Mismatch Volume: n/a Infarction Location: None evident by CTP. IMPRESSION: 1. No large vessel occlusion or significant stenosis in the head and neck. 2. Negative CTP. 3. Advanced cervical disc and facet degeneration. 4.  Aortic Atherosclerosis (ICD10-I70.0). These results were communicated to Dr. Rory Percy at 10:53 am on 11/10/2019 by text page via the [facility] messaging system. Electronically Signed   By: Logan Bores M.D.   On: 11/10/2019 11:05   CT Code Stroke CTA Neck W/WO contrast  Result Date: 11/10/2019 CLINICAL DATA:  Generalized weakness.  Encephalopathy. EXAM: CT ANGIOGRAPHY HEAD AND NECK CT PERFUSION BRAIN TECHNIQUE: Multidetector CT imaging of the head and neck was performed using the standard protocol during bolus administration of intravenous contrast. Multiplanar CT image reconstructions and MIPs were obtained to evaluate the vascular anatomy. Carotid stenosis measurements (when applicable) are obtained utilizing NASCET criteria, using the distal internal carotid diameter as the denominator. Multiphase CT imaging of the brain was performed following IV bolus contrast injection. Subsequent parametric perfusion maps were calculated using RAPID software. CONTRAST:  40mL OMNIPAQUE IOHEXOL 350 MG/ML SOLN COMPARISON:  None. FINDINGS: CTA NECK FINDINGS Aortic arch: Standard 3 vessel aortic arch with minimal atherosclerotic plaque. Widely patent arch vessel origins. Right carotid system: Patent without evidence of stenosis, dissection, or significant atherosclerosis. Left carotid system: Patent without evidence of stenosis, dissection, or significant atherosclerosis.  Vertebral arteries: Patent and codominant without evidence of stenosis, dissection, or significant atherosclerosis. Skeleton: Severe facet arthrosis at C3-4 and C4-5 with grade 1 anterolisthesis at both levels. Severe disc degeneration at C5-6 and C6-7. Severe multilevel neural foraminal stenosis in the cervical spine. Other neck: No evidence of cervical lymphadenopathy or mass. Upper chest: No apical lung consolidation or mass. Review of the MIP images confirms the above findings CTA HEAD FINDINGS Anterior circulation: The internal carotid arteries are patent from skull base to carotid termini with minimal nonstenotic plaque bilaterally. ACAs and MCAs are patent without evidence of proximal branch occlusion or significant proximal stenosis. No aneurysm is identified. Posterior circulation: The intracranial vertebral arteries are widely patent to the basilar. Patent PICA, AICA, and SCA origins are identified bilaterally. Posterior communicating arteries are not clearly identified and may be diminutive or absent. The PCAs are patent without evidence of significant proximal stenosis. No aneurysm is identified. Venous sinuses: As permitted by contrast timing, patent. Anatomic variants: None. Review of the MIP images confirms the above findings CT Brain Perfusion Findings: CBF (<30%) Volume: 0 mL Perfusion (Tmax>6.0s) volume: 0 mL Mismatch Volume: n/a Infarction Location: None evident by CTP.  IMPRESSION: 1. No large vessel occlusion or significant stenosis in the head and neck. 2. Negative CTP. 3. Advanced cervical disc and facet degeneration. 4.  Aortic Atherosclerosis (ICD10-I70.0). These results were communicated to Dr. Rory Percy at 10:53 am on 11/10/2019 by text page via the [facility] messaging system. Electronically Signed   By: Logan Bores M.D.   On: 11/10/2019 11:05   MR BRAIN WO CONTRAST  Result Date: 11/10/2019 CLINICAL DATA:  Generalized weakness. Encephalopathy. EXAM: MRI HEAD WITHOUT CONTRAST TECHNIQUE:  Multiplanar, multiecho pulse sequences of the brain and surrounding structures were obtained without intravenous contrast. COMPARISON:  Head CT 11/10/2019 and MRI 02/28/2017 FINDINGS: Brain: There are patchy acute infarcts involving the basal ganglia/corona radiata bilaterally, left larger than right. A lacunar infarct in the posterior limb of the left internal capsule/lateral left thalamus is chronic but new from the prior MRI. T2 hyperintensities elsewhere in the cerebral white matter bilaterally have mildly progressed from the prior MRI and are nonspecific but compatible with mildly age advanced chronic small vessel ischemic disease. No intracranial hemorrhage, mass, midline shift, or extra-axial fluid collection is identified. Mild cerebral atrophy is within normal limits for age. Vascular: Major intracranial vascular flow voids are preserved. Skull and upper cervical spine: Unremarkable bone marrow signal. C3-4 facet arthrosis with grade 1 anterolisthesis. Sinuses/Orbits: Right cataract extraction. Paranasal sinuses and mastoid air cells are clear. Other: None. IMPRESSION: 1. Patchy acute infarcts involving the left greater than right basal ganglia and corona radiata. 2. Mild chronic small vessel ischemic disease. Electronically Signed   By: Logan Bores M.D.   On: 11/10/2019 11:28   CT Code Stroke Cerebral Perfusion with contrast  Result Date: 11/10/2019 CLINICAL DATA:  Generalized weakness.  Encephalopathy. EXAM: CT ANGIOGRAPHY HEAD AND NECK CT PERFUSION BRAIN TECHNIQUE: Multidetector CT imaging of the head and neck was performed using the standard protocol during bolus administration of intravenous contrast. Multiplanar CT image reconstructions and MIPs were obtained to evaluate the vascular anatomy. Carotid stenosis measurements (when applicable) are obtained utilizing NASCET criteria, using the distal internal carotid diameter as the denominator. Multiphase CT imaging of the brain was performed  following IV bolus contrast injection. Subsequent parametric perfusion maps were calculated using RAPID software. CONTRAST:  69mL OMNIPAQUE IOHEXOL 350 MG/ML SOLN COMPARISON:  None. FINDINGS: CTA NECK FINDINGS Aortic arch: Standard 3 vessel aortic arch with minimal atherosclerotic plaque. Widely patent arch vessel origins. Right carotid system: Patent without evidence of stenosis, dissection, or significant atherosclerosis. Left carotid system: Patent without evidence of stenosis, dissection, or significant atherosclerosis. Vertebral arteries: Patent and codominant without evidence of stenosis, dissection, or significant atherosclerosis. Skeleton: Severe facet arthrosis at C3-4 and C4-5 with grade 1 anterolisthesis at both levels. Severe disc degeneration at C5-6 and C6-7. Severe multilevel neural foraminal stenosis in the cervical spine. Other neck: No evidence of cervical lymphadenopathy or mass. Upper chest: No apical lung consolidation or mass. Review of the MIP images confirms the above findings CTA HEAD FINDINGS Anterior circulation: The internal carotid arteries are patent from skull base to carotid termini with minimal nonstenotic plaque bilaterally. ACAs and MCAs are patent without evidence of proximal branch occlusion or significant proximal stenosis. No aneurysm is identified. Posterior circulation: The intracranial vertebral arteries are widely patent to the basilar. Patent PICA, AICA, and SCA origins are identified bilaterally. Posterior communicating arteries are not clearly identified and may be diminutive or absent. The PCAs are patent without evidence of significant proximal stenosis. No aneurysm is identified. Venous sinuses: As permitted by contrast timing,  patent. Anatomic variants: None. Review of the MIP images confirms the above findings CT Brain Perfusion Findings: CBF (<30%) Volume: 0 mL Perfusion (Tmax>6.0s) volume: 0 mL Mismatch Volume: n/a Infarction Location: None evident by CTP.  IMPRESSION: 1. No large vessel occlusion or significant stenosis in the head and neck. 2. Negative CTP. 3. Advanced cervical disc and facet degeneration. 4.  Aortic Atherosclerosis (ICD10-I70.0). These results were communicated to Dr. Rory Percy at 10:53 am on 11/10/2019 by text page via the [facility] messaging system. Electronically Signed   By: Logan Bores M.D.   On: 11/10/2019 11:05   DG Chest Portable 1 View  Result Date: 11/10/2019 CLINICAL DATA:  Altered mental status. EXAM: PORTABLE CHEST 1 VIEW COMPARISON:  12/25/2017 FINDINGS: The heart size and mediastinal contours are within normal limits. Both lungs are clear. The visualized skeletal structures are unremarkable. IMPRESSION: Normal exam. Electronically Signed   By: Lorriane Shire M.D.   On: 11/10/2019 13:22   ECHOCARDIOGRAM COMPLETE  Result Date: 11/11/2019    ECHOCARDIOGRAM REPORT   Patient Name:   LUNA AUDIA Date of Exam: 11/11/2019 Medical Rec #:  [id number]            Height:       63.0 in Accession #:    [id number]           Weight:       127.9 lb Date of Birth:  1945-10-29           BSA:          1.599 m Patient Age:    58 years             BP:           165/86 mmHg Patient Gender: F                    HR:           69 bpm. Exam Location:  Inpatient Procedure: 2D Echo, Cardiac Doppler, Color Doppler and 3D Echo Indications:    Stroke  History:        Patient has no prior history of Echocardiogram examinations.                 Stroke; Risk Factors:Hypertension and Dyslipidemia.  Sonographer:    Roseanna Rainbow RDCS Referring Phys: [place]  1. Left ventricular ejection fraction, by estimation, is 60 to 65%. The left ventricle has normal function. The left ventricle has no regional wall motion abnormalities. There is mild concentric left ventricular hypertrophy. Left ventricular diastolic parameters are consistent with Grade I diastolic dysfunction (impaired relaxation).  2. Right ventricular systolic function is normal. The  right ventricular size is normal. Tricuspid regurgitation signal is inadequate for assessing PA pressure.  3. The mitral valve is grossly normal. No evidence of mitral valve regurgitation. No evidence of mitral stenosis.  4. The aortic valve is tricuspid. Aortic valve regurgitation is mild. No aortic stenosis is present.  5. The inferior vena cava is normal in size with greater than 50% respiratory variability, suggesting right atrial pressure of 3 mmHg. FINDINGS  Left Ventricle: Left ventricular ejection fraction, by estimation, is 60 to 65%. The left ventricle has normal function. The left ventricle has no regional wall motion abnormalities. The left ventricular internal cavity size was normal in size. There is  mild concentric left ventricular hypertrophy. Left ventricular diastolic parameters are consistent with Grade I diastolic dysfunction (impaired relaxation). Normal left ventricular filling pressure. Right  Ventricle: The right ventricular size is normal. No increase in right ventricular wall thickness. Right ventricular systolic function is normal. Tricuspid regurgitation signal is inadequate for assessing PA pressure. Left Atrium: Left atrial size was normal in size. Right Atrium: Right atrial size was normal in size. Pericardium: Trivial pericardial effusion is present. Presence of pericardial fat pad. Mitral Valve: The mitral valve is grossly normal. No evidence of mitral valve regurgitation. No evidence of mitral valve stenosis. Tricuspid Valve: The tricuspid valve is grossly normal. Tricuspid valve regurgitation is not demonstrated. No evidence of tricuspid stenosis. Aortic Valve: The aortic valve is tricuspid. Aortic valve regurgitation is mild. Aortic regurgitation PHT measures 411 msec. No aortic stenosis is present. Aortic valve mean gradient measures 4.0 mmHg. Aortic valve peak gradient measures 7.3 mmHg. Aortic  valve area, by VTI measures 1.66 cm. Pulmonic Valve: The pulmonic valve was grossly  normal. Pulmonic valve regurgitation is not visualized. No evidence of pulmonic stenosis. Aorta: The aortic root and ascending aorta are structurally normal, with no evidence of dilitation. Venous: The inferior vena cava is normal in size with greater than 50% respiratory variability, suggesting right atrial pressure of 3 mmHg. IAS/Shunts: The atrial septum is grossly normal.  LEFT VENTRICLE PLAX 2D LVIDd:         3.20 cm     Diastology LVIDs:         2.10 cm     LV e' lateral:   5.98 cm/s LV PW:         1.20 cm     LV E/e' lateral: 6.2 LV IVS:        1.30 cm     LV e' medial:    4.41 cm/s LVOT diam:     1.70 cm     LV E/e' medial:  8.4 LV SV:         41 LV SV Index:   26 LVOT Area:     2.27 cm  LV Volumes (MOD) LV vol d, MOD A2C: 57.3 ml LV vol d, MOD A4C: 59.1 ml LV vol s, MOD A2C: 16.1 ml LV vol s, MOD A4C: 22.1 ml LV SV MOD A2C:     41.2 ml LV SV MOD A4C:     59.1 ml LV SV MOD BP:      43.9 ml RIGHT VENTRICLE             IVC RV S prime:     13.60 cm/s  IVC diam: 1.00 cm TAPSE (M-mode): 2.1 cm LEFT ATRIUM             Index       RIGHT ATRIUM          Index LA diam:        2.70 cm 1.69 cm/m  RA Area:     9.88 cm LA Vol (A2C):   16.5 ml 10.32 ml/m RA Volume:   20.50 ml 12.82 ml/m LA Vol (A4C):   16.3 ml 10.19 ml/m LA Biplane Vol: 18.0 ml 11.26 ml/m  AORTIC VALVE AV Area (Vmax):    1.48 cm AV Area (Vmean):   1.55 cm AV Area (VTI):     1.66 cm AV Vmax:           135.00 cm/s AV Vmean:          88.700 cm/s AV VTI:            0.246 m AV Peak Grad:      7.3 mmHg AV Mean  Grad:      4.0 mmHg LVOT Vmax:         88.30 cm/s LVOT Vmean:        60.500 cm/s LVOT VTI:          0.180 m LVOT/AV VTI ratio: 0.73 AI PHT:            411 msec  AORTA Ao Root diam: 2.60 cm Ao Asc diam:  3.90 cm MITRAL VALVE MV Area (PHT): 3.27 cm    SHUNTS MV Decel Time: 232 msec    Systemic VTI:  0.18 m MV E velocity: 37.20 cm/s  Systemic Diam: 1.70 cm MV A velocity: 79.40 cm/s MV E/A ratio:  0.47 Eleonore Chiquito MD Electronically signed by  Eleonore Chiquito MD Signature Date/Time: 11/11/2019/5:18:18 PM    Final    CT HEAD CODE STROKE WO CONTRAST  Result Date: 11/10/2019 CLINICAL DATA:  Code stroke.  Generalized weakness. EXAM: CT HEAD WITHOUT CONTRAST TECHNIQUE: Contiguous axial images were obtained from the base of the skull through the vertex without intravenous contrast. COMPARISON:  Head MRI 02/28/2017 FINDINGS: Brain: There is no evidence of acute large territory infarct, intracranial hemorrhage, mass, midline shift, or extra-axial fluid collection. Mild cerebral atrophy is within normal limits for age. Patchy hypodensities in the cerebral white matter bilaterally are nonspecific but compatible with mild to moderate chronic small vessel ischemic disease. Asymmetric, more focal hypodensities are present in the left basal ganglia, left internal capsule, and left corona radiata, new from the prior MRI and compatible with age indeterminate lacunar infarcts. Vascular: Calcified atherosclerosis at the skull base. No hyperdense vessel. Skull: No fracture or suspicious osseous lesion. Sinuses/Orbits: Visualized paranasal sinuses and mastoid air cells are clear. Right cataract extraction is noted. Other: None. ASPECTS [facility] Stroke Program Early CT Score) Not scored due to the lack of localizing symptoms. IMPRESSION: 1. Age indeterminate though possibly recent left basal ganglia region infarcts. 2. No evidence of acute cortically based infarct or intracranial hemorrhage. 3. Mild-to-moderate chronic small vessel ischemic disease. These findings were discussed via telephone with Dr. Rory Percy at 10:18 am on 11/10/2019. Electronically Signed   By: Logan Bores M.D.   On: 11/10/2019 10:22    Assessment/Plan 1. 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 Combined receptive and expressive aphasia due to acute stroke [facility]) -continue  with ST here for this and her dysphagia  3. Urinary retention -cont foley with monthly changes  4. Epigastric abdominal pain -on PPI--changed to pantoprazole now -also has simethicone for gas which pt thinks she was having -await CT abd/pelvis b/c this was coming and going in severe waves at [facility]  5. Dysphagia following cerebrovascular accident (CVA) -to work with ST here, continue aspiration precautions, assistance with meals  6. Right hip pain -seems positional; needs a cushion for her wheelchair at least, but likely a different, better-fitting wheelchair -may use tylenol and topical voltaren or similar for pain  Family/ staff Communication: discussed with snf nurse  Labs/tests ordered:  CT abd/pelvis now arranged  Ryden Wainer L. Jaid Quirion, D.O. [facility] [address], [place] [address] Cell Phone (Mon-Fri 8am-5pm):  [phone] On Call:  [phone] & follow prompts after 5pm & weekends Office Phone:  [phone] Office Fax:  [phone]

## 2020-01-14 ENCOUNTER — Telehealth: Payer: Self-pay

## 2020-01-14 ENCOUNTER — Encounter: Payer: Self-pay | Admitting: Adult Health

## 2020-01-14 ENCOUNTER — Non-Acute Institutional Stay (SKILLED_NURSING_FACILITY): Payer: Medicare Other | Admitting: Adult Health

## 2020-01-14 DIAGNOSIS — B3731 Acute candidiasis of vulva and vagina: Secondary | ICD-10-CM

## 2020-01-14 DIAGNOSIS — B373 Candidiasis of vulva and vagina: Secondary | ICD-10-CM | POA: Diagnosis not present

## 2020-01-14 NOTE — Telephone Encounter (Signed)
Would recommend having Cindi Carbon, Np review and sign order since she is covering at [facility] today

## 2020-01-14 NOTE — Telephone Encounter (Signed)
Mrs Riedinger has an order for Abd CT on July first that has not been released or signed. Per Manuela Schwartz at [facility]. Please advise.

## 2020-01-14 NOTE — Telephone Encounter (Signed)
I gave Lynn [name] the message and she stated she would forward it to [place]

## 2020-01-14 NOTE — Progress Notes (Signed)
Location:  Occupational psychologist of Service:  SNF (31) Provider:   Cindi Carbon, ANP [place] [phone]   Gayland Curry, DO  Patient Care Team: Gayland Curry, DO as PCP - General (Geriatric Medicine) Janne Lab, MD (Obstetrics and Gynecology) Wylene Simmer, MD as Consulting Physician (Orthopedic Surgery) Cameron Sprang, MD as Consulting Physician (Neurology)  Extended Emergency Contact Information Primary Emergency Contact: [facility] Address: [address]          [address]          [place], [place] [address] [place] Phone: [phone] Mobile Phone: [phone] Relation: Mother Secondary Emergency Contact: jakaila, [place] Mobile Phone: [phone] Relation: Brother  Code Status:  Full code  Goals of care: Advanced Directive information Advanced Directives 01/12/2020  Does Patient Have a Medical Advance Directive? No  Type of Advance Directive -  Does patient want to make changes to medical advance directive? No - Patient declined  Would patient like information on creating a medical advance directive? -     Chief Complaint  Patient presents with  . Acute Visit    vaginal bumps    HPI:  Pt is a 74 y.o. female seen today for an acute visit for vaginal bumps. Pt reports some itching and mild discomfort to the vaginal area. She has a catheter due to urinary retention and denies any bladder pain, dysuria, or fever. There are no reports of vaginal pain, bleeding, or discharge.    Past Medical History:  Diagnosis Date  . Anxiety   . Aortic atherosclerosis ([place])   . Arthritis   . Complication of anesthesia    per pt, slow to wake up past sedation.  . CVA (cerebral vascular accident) ([place]) 11/10/2019  . Depression   . Hyperplastic colon polyp   . Hypertension   . Neuropathy   . Osteoarthritis   . Osteoporosis   . Stomach upset    has a senstive stomach  . Throat clearing    has a cough  at times  . Urinary incontinence   . Vitamin D deficiency    Past Surgical History:  Procedure Laterality Date  . COLONOSCOPY    . FACIAL COSMETIC SURGERY     over 15 years ago  . FINGER TENDON REPAIR     middle rt hand-  . HAMMER TOE SURGERY  12/27/2011   Procedure: HAMMER TOE CORRECTION;  Surgeon: Wylene Simmer, MD;  Location: [facility];  Service: Orthopedics;  Laterality: Left;  2-4th    Allergies  Allergen Reactions  . Chlorhexidine     Outpatient Encounter Medications as of 01/14/2020  Medication Sig  . acetaminophen (TYLENOL) 325 MG tablet Take 650 mg by mouth every 6 (six) hours as needed.  Marland Kitchen aspirin 81 MG tablet Take 81 mg by mouth daily.  Marland Kitchen atorvastatin (LIPITOR) 40 MG tablet Take 1 tablet (40 mg total) by mouth daily.  . bisacodyl (BISACODYL) 5 MG EC tablet Take 5 mg by mouth daily as needed for mild constipation or moderate constipation.  . bisacodyl (DULCOLAX) 10 MG suppository If not relieved by MOM, give 10 mg Bisacodyl suppositiory rectally X 1 dose in 24 hours as needed (Do not use constipation standing orders for residents with renal failure/CFR less than 30. Contact MD for orders) (Physician Order)  . clopidogrel (PLAVIX) 75 MG tablet Take 1 tablet (75 mg total) by mouth daily.  . hydrocortisone cream 1 % Apply 1 application  topically 2 (two) times daily.  . magnesium hydroxide (MILK OF MAGNESIA) 400 MG/5ML suspension If no BM in 3 days, give 30 cc Milk of Magnesium p.o. x 1 dose in 24 hours as needed (Do not use standing constipation orders for residents with renal failure CFR less than 30. Contact MD for orders) (Physician Order)  . metoprolol tartrate (LOPRESSOR) 25 MG tablet Take 1 tablet (25 mg total) by mouth 2 (two) times daily.  . NON FORMULARY Diet: Mech Soft, no breads. Thin Liquids  . NON FORMULARY 4oz Medpass d/t Dx Malnutrition/poor intake.  Marland Kitchen omeprazole (PRILOSEC) 20 MG capsule Take 20 mg by mouth daily.  . simethicone (MYLICON) 80 MG  chewable tablet Chew 1 tablet (80 mg total) by mouth every 8 (eight) hours as needed for flatulence.  . Sodium Phosphates (RA SALINE ENEMA RE) If not relieved by Biscodyl suppository, give disposable Saline Enema rectally X 1 dose/24 hrs as needed (Do not use constipation standing orders for residents with renal failure/CFR less than 30. Contact MD for orders)(Physician Or   No facility-administered encounter medications on file as of 01/14/2020.    Review of Systems  Genitourinary: Positive for genital sores. Negative for decreased urine volume, difficulty urinating, dysuria, enuresis, flank pain, frequency, pelvic pain, urgency, vaginal bleeding, vaginal discharge and vaginal pain.    Immunization History  Administered Date(s) Administered  . Fluad Quad(high Dose 65+) 04/27/2019  . Influenza, High Dose Seasonal PF 05/03/2017, 05/19/2018  . Influenza,inj,Quad PF,6+ Mos 04/29/2013, 05/18/2014, 05/02/2015, 04/17/2016  . PFIZER SARS-COV-2 Vaccination 09/21/2019  . Pneumococcal Conjugate-13 05/11/2013  . Pneumococcal Polysaccharide-23 05/02/2015   Pertinent  Health Maintenance Due  Topic Date Due  . MAMMOGRAM  01/09/2019  . DEXA SCAN  12/26/2019  . INFLUENZA VACCINE  02/21/2020  . COLONOSCOPY  03/11/2024  . PNA vac Low Risk Adult  Completed   Fall Risk  12/07/2019 04/27/2019 08/05/2018 06/17/2018 12/25/2017  Falls in the past year? 0 1 0 0 No  Number falls in past yr: 0 0 - 0 -  Injury with Fall? 0 1 - 0 -   Functional Status Survey:    There were no vitals filed for this visit. There is no height or weight on file to calculate BMI. Physical Exam Cardiovascular:     Rate and Rhythm: Normal rate. Rhythm irregular.  Pulmonary:     Effort: Pulmonary effort is normal.     Breath sounds: Normal breath sounds.  Abdominal:     General: Bowel sounds are normal. There is no distension.     Palpations: Abdomen is soft.     Tenderness: There is no abdominal tenderness. There is no right CVA  tenderness or left CVA tenderness.  Genitourinary:   Musculoskeletal:     Right lower leg: No edema.     Left lower leg: No edema.  Skin:    Findings: Erythema (noted to vaginal area and buttocks ) and rash present.  Neurological:     Mental Status: She is alert.     Comments: Oriented to self and place and situation      Labs reviewed: Recent Labs    11/18/19 0343 11/19/19 0240 11/21/19 0308 11/21/19 0308 11/22/19 0502 11/22/19 0502 11/23/19 0356 11/23/19 0356 11/25/19 0000 11/25/19 0000 12/08/19 0400 12/17/19 0500 01/01/20 1255  NA 139   < > 147*   < > 144   < > 141  --  145   < > 138 136* 141  K 4.4   < >  3.6   < > 3.2*   < > 3.8   < > 4.0   < > 4.1 3.6 3.7  CL 108   < > 112*   < > 112*   < > 110   < > 107   < > 102 102 105  CO2 12*   < > 27   < > 25   < > 23   < > 22   < > 24* 21 21  GLUCOSE 123*   < > 110*  --  113*  --  102*  --   --   --   --   --   --   BUN 21   < > 29*   < > 25*   < > 17  --  32*   < > 20 15 13   CREATININE 0.62   < > 0.65   < > 0.60   < > 0.56  --  0.5  --   --  0.4* 0.4*  CALCIUM 9.1   < > 9.2   < > 8.8*   < > 8.8*   < > 9.4   < > 9.6 9.0 9.9  MG 2.0  --  2.2  --   --   --  1.8  --   --   --   --   --   --   PHOS  --   --  2.8  --   --   --   --   --   --   --   --   --   --    < > = values in this interval not displayed.   Recent Labs    11/20/19 0332 11/20/19 0332 11/21/19 0308 11/21/19 0308 11/22/19 0502 12/17/19 0500 12/24/19 1255  AST 30   < > 26   < > 22 22 23   ALT 66*   < > 51*   < > 37 28 38*  ALKPHOS 80  --  77  --  66  --   --   BILITOT 0.6  --  0.5  --  0.4  --   --   PROT 5.7*  --  5.7*  --  5.0*  --   --   ALBUMIN 2.3*   < > 2.4*   < > 2.1* 3.1* 3.2*   < > = values in this interval not displayed.   Recent Labs    11/21/19 0308 11/21/19 0308 11/22/19 0502 11/22/19 0502 11/23/19 0356 11/25/19 0000 12/08/19 0400 12/17/19 0500 01/01/20 1255  WBC 12.9*   < > 13.9*   < > 13.0*   < > 10.8 11.2 12.8  NEUTROABS  10.2*  --  11.1*  --  10.5*  --   --   --   --   HGB 14.0   < > 12.4   < > 13.0   < > 13.2 12.6 14.0  HCT 44.6   < > 40.0   < > 40.2   < > 40 38 43  MCV 98.0  --  98.8  --  96.6  --   --   --   --   PLT 442*   < > 411*   < > 431*   < > 9* 319 318   < > = values in this interval not displayed.   Lab Results  Component Value Date   TSH 1.728 11/10/2019   Lab Results  Component Value Date   HGBA1C 5.1 11/11/2019   Lab Results  Component Value Date   CHOL 196 11/11/2019   HDL 65 11/11/2019   LDLCALC 115 (H) 11/11/2019   LDLDIRECT 116.0 04/27/2019   TRIG 78 11/11/2019   CHOLHDL 3.0 11/11/2019    Significant Diagnostic Results in last 30 days:  CARDIAC EVENT MONITOR  Result Date: 01/03/2020 Sinus rhythm No AV block or pauses No ventricular arrhythmias No afib   Assessment/Plan 1. Vaginal candida Diflucan 150 mg x 1    Family/ staff Communication: resident  Labs/tests ordered:  NA

## 2020-01-18 LAB — BASIC METABOLIC PANEL
BUN: 15 (ref 4–21)
CO2: 24 — AB (ref 13–22)
Chloride: 103 (ref 99–108)
Creatinine: 0.3 — AB (ref 0.5–1.1)
Glucose: 82
Potassium: 3.9 (ref 3.4–5.3)
Sodium: 140 (ref 137–147)

## 2020-01-18 LAB — HEPATIC FUNCTION PANEL
ALT: 105 — AB (ref 7–35)
AST: 63 — AB (ref 13–35)
Alkaline Phosphatase: 108 (ref 25–125)
Bilirubin, Total: 0.5

## 2020-01-18 LAB — COMPREHENSIVE METABOLIC PANEL: Calcium: 10.4 (ref 8.7–10.7)

## 2020-01-18 LAB — CBC AND DIFFERENTIAL
HCT: 42 (ref 36–46)
Hemoglobin: 13.8 (ref 12.0–16.0)
Platelets: 318 (ref 150–399)
WBC: 8.4

## 2020-01-18 LAB — CBC: RBC: 4.52 (ref 3.87–5.11)

## 2020-01-21 ENCOUNTER — Ambulatory Visit (HOSPITAL_COMMUNITY)
Admission: RE | Admit: 2020-01-21 | Discharge: 2020-01-21 | Disposition: A | Discharge status: Home / Self Care | Payer: Medicare Other | Source: Ambulatory Visit | Attending: Internal Medicine | Admitting: Internal Medicine

## 2020-01-21 ENCOUNTER — Other Ambulatory Visit: Payer: Self-pay

## 2020-01-21 DIAGNOSIS — R1013 Epigastric pain: Secondary | ICD-10-CM | POA: Diagnosis present

## 2020-01-21 MED ORDER — SODIUM CHLORIDE (PF) 0.9 % IJ SOLN
INTRAMUSCULAR | Status: AC
  Filled 2020-01-21: qty 50

## 2020-01-21 MED ORDER — IOHEXOL 300 MG/ML  SOLN
75.0000 mL | Freq: Once | INTRAMUSCULAR | Status: AC | PRN
  Administered 2020-01-21: 75 mL via INTRAVENOUS

## 2020-01-21 NOTE — Addendum Note (Signed)
Addended by: Gayland Curry on: 01/21/2020 01:57 PM   Modules accepted: Orders

## 2020-01-22 ENCOUNTER — Telehealth: Payer: Self-pay

## 2020-01-22 ENCOUNTER — Other Ambulatory Visit: Payer: Self-pay | Admitting: Internal Medicine

## 2020-01-22 NOTE — Progress Notes (Signed)
Results reviewed with [facility] nurse manager.  Plan is to disimpact (odd she's impacted b/c there are numerous bms documented in her chart the past few days and even today), treat for acute diverticulitis with clear liquid diet, cipro 500mg  po bid for 10 days and flagyl 500mg  po q8hr x 10 days.  Advance diet if no abdominal pain x 24 hrs.  Also added bentyl for abdominal pain meant to be temporary.  May also use the tylenol and gas-x she has.  The compression fx likely occurred a while back with her fall prior to her stroke.  This may contribute to pain but I opted to avoiding opioids with the acute issues of impaction and diverticulitis.  Will need eventual f/u about renal mass, but not acutely this weekend.

## 2020-01-22 NOTE — Telephone Encounter (Signed)
I've addressed the CT with the nurse at [facility].

## 2020-01-22 NOTE — Telephone Encounter (Signed)
Diane with [facility] radiology called wanting to bring your attention to impressions 1-4 of Ms. Bousquet's CT of abdomen/pelvis results.

## 2020-01-27 ENCOUNTER — Encounter: Payer: Self-pay | Admitting: Internal Medicine

## 2020-02-11 ENCOUNTER — Encounter: Payer: Self-pay | Admitting: Adult Health

## 2020-02-11 ENCOUNTER — Non-Acute Institutional Stay (SKILLED_NURSING_FACILITY): Payer: Medicare Other | Admitting: Adult Health

## 2020-02-11 DIAGNOSIS — R7401 Elevation of levels of liver transaminase levels: Secondary | ICD-10-CM

## 2020-02-11 DIAGNOSIS — K529 Noninfective gastroenteritis and colitis, unspecified: Secondary | ICD-10-CM | POA: Diagnosis not present

## 2020-02-11 DIAGNOSIS — R1013 Epigastric pain: Secondary | ICD-10-CM

## 2020-02-11 DIAGNOSIS — G3184 Mild cognitive impairment, so stated: Secondary | ICD-10-CM

## 2020-02-11 DIAGNOSIS — R339 Retention of urine, unspecified: Secondary | ICD-10-CM

## 2020-02-11 DIAGNOSIS — I69391 Dysphagia following cerebral infarction: Secondary | ICD-10-CM

## 2020-02-11 DIAGNOSIS — K5901 Slow transit constipation: Secondary | ICD-10-CM

## 2020-02-11 DIAGNOSIS — I69354 Hemiplegia and hemiparesis following cerebral infarction affecting left non-dominant side: Secondary | ICD-10-CM

## 2020-02-11 NOTE — Progress Notes (Signed)
Location:  Occupational psychologist of Service:  SNF (31) Provider:   Cindi Carbon, ANP [place] [phone]   Gayland Curry, DO  Patient Care Team: Gayland Curry, DO as PCP - General (Geriatric Medicine) Wylene Simmer, MD as Consulting Physician (Orthopedic Surgery) Cameron Sprang, MD as Consulting Physician (Neurology)  Extended Emergency Contact Information Primary Emergency Contact: [facility] Address: [address]          [address]          [place], [place] [address] [place] Phone: [phone] Mobile Phone: [phone] Relation: Mother Secondary Emergency Contact: meg, niemeier Mobile Phone: [phone] Relation: Brother  Code Status:  Full code  Goals of care: Advanced Directive information Advanced Directives 01/12/2020  Does Patient Have a Medical Advance Directive? No  Type of Advance Directive -  Does patient want to make changes to medical advance directive? No - Patient declined  Would patient like information on creating a medical advance directive? -     Chief Complaint  Patient presents with  . Medical Management of Chronic Issues    HPI:  Pt is a 74 y.o. female seen today for medical management of chronic diseases.    Lynn [name] has a hx of acute CVA in April with residual dysphagia and left sided weakness.She was originally at [facility] but moved to [place] in June.  She is working with ST regarding cognition and dysphagia. She is tolerating a mech soft diet with NTL. She was treated with Cipro and flagyl for colitis/proctitis and constipation on 7/2 due to results of CT imaging on 7/1.   Now she is having bowel movements qod. She had epigastric pain prior to treatment and reports that this is better but not resolved. She states that it comes and goes and is not correlated with eating. Not better with lying down. Does not radiate. She has not had a fever. Appetite remains  small, however, in the past month she has gained 2 lbs.   She has a foley catheter and she is wondering if she can have it out. She failed a prior voiding trial at [facility].   She also has an event monitor attached to her chest due to her hx of CVA. A report is recorded in epic with no acute findings.   The nurse asked that I check her arm due to mild edema noted at the left elbow which is not painful.   LFTs were elevatd on 6/28 which were checked due to the abd pain.  Lab Results  Component Value Date   ALT 105 (A) 01/18/2020   AST 63 (A) 01/18/2020   ALKPHOS 108 01/18/2020   BILITOT 0.4 11/22/2019     Past Medical History:  Diagnosis Date  . Anxiety   . Aortic atherosclerosis ([place] II)   . Arthritis   . Complication of anesthesia    per pt, slow to wake up past sedation.  . CVA (cerebral vascular accident) ([place]) 11/10/2019  . Depression   . Hyperplastic colon polyp   . Hypertension   . Neuropathy   . Osteoarthritis   . Osteoporosis   . Stomach upset    has a senstive stomach  . Throat clearing    has a cough at times  . Urinary incontinence   . Vitamin D deficiency    Past Surgical History:  Procedure Laterality Date  . COLONOSCOPY    . FACIAL COSMETIC SURGERY  over 15 years ago  . FINGER TENDON REPAIR     middle rt hand-  . HAMMER TOE SURGERY  12/27/2011   Procedure: HAMMER TOE CORRECTION;  Surgeon: Wylene Simmer, MD;  Location: [facility];  Service: Orthopedics;  Laterality: Left;  2-4th    Allergies  Allergen Reactions  . Chlorhexidine     Outpatient Encounter Medications as of 02/11/2020  Medication Sig  . acetaminophen (TYLENOL) 325 MG tablet Take 650 mg by mouth at bedtime. And q 6 hrs prn  . aspirin 81 MG tablet Take 81 mg by mouth daily.  Marland Kitchen atorvastatin (LIPITOR) 40 MG tablet Take 1 tablet (40 mg total) by mouth daily.  . bisacodyl (BISACODYL) 5 MG EC tablet Take 5 mg by mouth daily as needed for mild constipation or moderate  constipation.  . bisacodyl (DULCOLAX) 10 MG suppository If not relieved by MOM, give 10 mg Bisacodyl suppositiory rectally X 1 dose in 24 hours as needed (Do not use constipation standing orders for residents with renal failure/CFR less than 30. Contact MD for orders) (Physician Order)  . clopidogrel (PLAVIX) 75 MG tablet Take 1 tablet (75 mg total) by mouth daily.  Marland Kitchen dicyclomine (BENTYL) 10 MG capsule Take 10 mg by mouth 3 (three) times daily as needed for spasms.  . pantoprazole (PROTONIX) 20 MG tablet Take 20 mg by mouth daily.  . simethicone (MYLICON) 80 MG chewable tablet Chew 1 tablet (80 mg total) by mouth every 8 (eight) hours as needed for flatulence.  . hydrocortisone cream 1 % Apply 1 application topically 2 (two) times daily.  . magnesium hydroxide (MILK OF MAGNESIA) 400 MG/5ML suspension If no BM in 3 days, give 30 cc Milk of Magnesium p.o. x 1 dose in 24 hours as needed (Do not use standing constipation orders for residents with renal failure CFR less than 30. Contact MD for orders) (Physician Order)  . metoprolol tartrate (LOPRESSOR) 25 MG tablet Take 1 tablet (25 mg total) by mouth 2 (two) times daily.  . NON FORMULARY Diet: Mech Soft, no breads. Thin Liquids  . NON FORMULARY 4oz Medpass d/t Dx Malnutrition/poor intake.  Marland Kitchen omeprazole (PRILOSEC) 20 MG capsule Take 20 mg by mouth daily.  . Sodium Phosphates (RA SALINE ENEMA RE) If not relieved by Biscodyl suppository, give disposable Saline Enema rectally X 1 dose/24 hrs as needed (Do not use constipation standing orders for residents with renal failure/CFR less than 30. Contact MD for orders)(Physician Or   No facility-administered encounter medications on file as of 02/11/2020.    Review of Systems  Constitutional: Negative for activity change, appetite change, chills, diaphoresis, fatigue, fever and unexpected weight change.  HENT: Positive for trouble swallowing. Negative for congestion.   Respiratory: Negative for cough,  shortness of breath and wheezing.   Cardiovascular: Negative for chest pain, palpitations and leg swelling.  Gastrointestinal: Positive for abdominal pain. Negative for abdominal distention, anal bleeding, blood in stool, constipation, diarrhea, nausea and rectal pain.  Genitourinary: Negative for difficulty urinating and dysuria.       Urinary retention with foley   Musculoskeletal: Positive for gait problem. Negative for arthralgias, back pain, joint swelling and myalgias.  Neurological: Positive for facial asymmetry, speech difficulty and weakness. Negative for dizziness, tremors, seizures, syncope, light-headedness, numbness and headaches.  Psychiatric/Behavioral: Negative for agitation, behavioral problems and confusion.    Immunization History  Administered Date(s) Administered  . Fluad Quad(high Dose 65+) 04/27/2019  . Influenza, High Dose Seasonal PF 05/03/2017, 05/19/2018  .  Influenza,inj,Quad PF,6+ Mos 04/29/2013, 05/18/2014, 05/02/2015, 04/17/2016  . PFIZER SARS-COV-2 Vaccination 09/21/2019  . Pneumococcal Conjugate-13 05/11/2013  . Pneumococcal Polysaccharide-23 05/02/2015   Pertinent  Health Maintenance Due  Topic Date Due  . MAMMOGRAM  01/09/2019  . DEXA SCAN  12/26/2019  . INFLUENZA VACCINE  02/21/2020  . COLONOSCOPY  03/11/2024  . PNA vac Low Risk Adult  Completed   Fall Risk  12/07/2019 04/27/2019 08/05/2018 06/17/2018 12/25/2017  Falls in the past year? 0 1 0 0 No  Number falls in past yr: 0 0 - 0 -  Injury with Fall? 0 1 - 0 -   Functional Status Survey:    Vitals:   02/11/20 1230  BP: 118/79  Pulse: 61  Resp: 16  Temp: (!) 97.3 F (36.3 C)  SpO2: 95%  Weight: 111 lb 6.4 oz (50.5 kg)   Body mass index is 19.73 kg/m. Physical Exam Vitals and nursing note reviewed.  Constitutional:      General: She is not in acute distress.    Appearance: She is not diaphoretic.  HENT:     Head: Normocephalic and atraumatic.  Neck:     Vascular: No JVD.    Cardiovascular:     Rate and Rhythm: Normal rate and regular rhythm.     Heart sounds: No murmur heard.   Pulmonary:     Effort: Pulmonary effort is normal. No respiratory distress.     Breath sounds: Normal breath sounds. No wheezing.  Abdominal:     General: Abdomen is flat. Bowel sounds are normal. There is no distension.     Palpations: Abdomen is soft.     Tenderness: There is no abdominal tenderness.  Genitourinary:    Comments: Foley with yellow urine  Musculoskeletal:        General: No tenderness or signs of injury.     Right lower leg: No edema.     Left lower leg: No edema.     Comments: Mild left arm edema non pitting no erythema or tenderness  Skin:    General: Skin is warm and dry.  Neurological:     Mental Status: She is alert and oriented to person, place, and time.     Comments: Left sided hemiplegia  Psychiatric:        Mood and Affect: Mood normal.     Labs reviewed: Recent Labs    11/18/19 0343 11/19/19 0240 11/21/19 0308 11/21/19 0308 11/22/19 0502 11/22/19 0502 11/23/19 0356 11/25/19 0000 12/17/19 0500 01/01/20 1255 01/18/20 0000  NA 139   < > 147*   < > 144   < > 141   < > 136* 141 140  K 4.4   < > 3.6   < > 3.2*   < > 3.8   < > 3.6 3.7 3.9  CL 108   < > 112*   < > 112*   < > 110   < > 102 105 103  CO2 12*   < > 27   < > 25   < > 23   < > 21 21 24*  GLUCOSE 123*   < > 110*  --  113*  --  102*  --   --   --   --   BUN 21   < > 29*   < > 25*   < > 17   < > 15 13 15   CREATININE 0.62   < > 0.65   < > 0.60   < >  0.56   < > 0.4* 0.4* 0.3*  CALCIUM 9.1   < > 9.2   < > 8.8*   < > 8.8*   < > 9.0 9.9 10.4  MG 2.0  --  2.2  --   --   --  1.8  --   --   --   --   PHOS  --   --  2.8  --   --   --   --   --   --   --   --    < > = values in this interval not displayed.   Recent Labs    11/20/19 0332 11/20/19 0332 11/21/19 0308 11/21/19 0308 11/22/19 0502 11/22/19 0502 12/17/19 0500 12/24/19 1255 01/18/20 0000  AST 30   < > 26   < > 22   < >  22 23 63*  ALT 66*   < > 51*   < > 37   < > 28 38* 105*  ALKPHOS 80   < > 77  --  66  --   --   --  108  BILITOT 0.6  --  0.5  --  0.4  --   --   --   --   PROT 5.7*  --  5.7*  --  5.0*  --   --   --   --   ALBUMIN 2.3*   < > 2.4*   < > 2.1*  --  3.1* 3.2*  --    < > = values in this interval not displayed.   Recent Labs    11/21/19 0308 11/21/19 0308 11/22/19 0502 11/22/19 0502 11/23/19 0356 11/25/19 0000 12/17/19 0500 01/01/20 1255 01/18/20 0000  WBC 12.9*   < > 13.9*   < > 13.0*   < > 11.2 12.8 8.4  NEUTROABS 10.2*  --  11.1*  --  10.5*  --   --   --   --   HGB 14.0   < > 12.4   < > 13.0   < > 12.6 14.0 13.8  HCT 44.6   < > 40.0   < > 40.2   < > 38 43 42  MCV 98.0  --  98.8  --  96.6  --   --   --   --   PLT 442*   < > 411*   < > 431*   < > 319 318 318   < > = values in this interval not displayed.   Lab Results  Component Value Date   TSH 1.728 11/10/2019   Lab Results  Component Value Date   HGBA1C 5.1 11/11/2019   Lab Results  Component Value Date   CHOL 196 11/11/2019   HDL 65 11/11/2019   LDLCALC 115 (H) 11/11/2019   LDLDIRECT 116.0 04/27/2019   TRIG 78 11/11/2019   CHOLHDL 3.0 11/11/2019    Significant Diagnostic Results in last 30 days:  CT Abdomen Pelvis W Contrast  Result Date: 01/22/2020 CLINICAL DATA:  Abdominal pain, ultrasound nondiagnostic EXAM: CT ABDOMEN AND PELVIS WITH CONTRAST TECHNIQUE: Multidetector CT imaging of the abdomen and pelvis was performed using the standard protocol following bolus administration of intravenous contrast. CONTRAST:  53mL OMNIPAQUE IOHEXOL 300 MG/ML  SOLN COMPARISON:  Abdominal sonogram from April of 2021 and CT abdomen from 2017 FINDINGS: Lower chest: No consolidation.  No pleural effusion. Hepatobiliary: Liver without signs of focal lesion. Patent portal vein. No pericholecystic stranding. No biliary  duct dilation. Pancreas: Normal without mass, ductal dilation or inflammation. Spleen: Mild hypoattenuation at the  periphery of the spleen with wedge-shaped characteristics that does normalize on the delayed phase. Spleen is normal in size and contour. Adrenals/Urinary Tract: Adrenal glands mildly nodular. No discrete suspicious lesion. Increase in number of small low-density lesions in the LEFT kidney in particular with enlargement of the dominant cyst in the upper pole. These other lesions are likely small cysts per too small for definitive characterization. There is 1 area measuring approximately 6 mm that is approximately 145 Hounsfield units on image 34 of series 2. This could represent pseudo enhancement from adjacent parenchyma. There is no hydronephrosis. The urinary bladder is decompressed about a Foley catheter. Stomach/Bowel: Stomach is decompressed. There is a large duodenal diverticulum. Some dense material in the medial portion of the diverticulum not as well seen as a discrete masslike into T. The diverticulum is filled with contrast. Abnormality within the diverticulum referenced on today's study is reported on the previous imaging evaluation from 2017. There is some thickening along the wall of the diverticulum at the interface with the adjacent duodenum. No Peri duodenal stranding. No small bowel dilation. Normal appendix. Marked distension of the rectum with perirectal stranding and rectal wall thickening. Stool fills the rectum such that it is distended filling the lower pelvis. A 8.7 x 8.1 cm greatest axial dimension. Vascular/Lymphatic: Calcific and noncalcific atheromatous plaque of the abdominal aorta. No sign of aneurysm. No adenopathy in the retroperitoneum or in the upper abdomen. No pelvic lymphadenopathy. Reproductive: Uterus remains in situ.  No adnexal masses. Other: No ascites.  Marked perirectal stranding. Musculoskeletal: Extensive spinal degenerative changes. Interval compression fracture of the T12 vertebral level since the study of 2017. A similar appearance on the plain film of the abdomen  from November 18, 2019 IMPRESSION: 1. Signs of fecal impaction and stercoral colitis. Given degree of distension worsening of proctitis could lead to rectal compromise. 2. Large duodenal diverticulum without Peri duodenal stranding. Thickening of the bowel adjacent to this area may be present though the masslike appearance is not seen on today's study. If any workup as been performed since the 2017 evaluation correlate with these results. Endoscopic evaluation was performed on 03/12/2019. 3. Mild hypoattenuation at the periphery of the spleen with wedge-shaped characteristics that does normalize on the delayed phase. This could represent a small splenic infarct or variable perfusion, correlate with any LEFT upper quadrant pain. 4. Small renal lesion suspicious for small solid renal neoplasm versus is hemorrhagic cyst. Consider follow-up with renal protocol CT within 6 months for further evaluation, at this time water could be utilized as enteric contrast to evaluate the duodenum. 5. Interval compression fracture of the T12 vertebral level since the study of 2017. A similar appearance on the plain film of the abdomen from April 28, 20211. 6. Aortic atherosclerosis. These results will be called to the ordering clinician or representative by the Radiologist Assistant, and communication documented in the PACS or [employer]. Aortic Atherosclerosis (ICD10-I70.0). Electronically Signed   By: Zetta Bills M.D.   On: 01/22/2020 11:36    Assessment/Plan 1. Colitis Resolved   2. Transaminitis Unclear, no signs of gallbladder issues at this time. Remains on statin with no muscle weakness or myalgia CMP on Mon 7/26  3. Epigastric pain Improved but not resolved. ?if this is gas related as the abd is not tender, the pain is not associated with a meal, and is not radicular Simethicone 80 mg 2 tabs  bid x 2 weeks   4. Slow transit constipation Resolved   5. Dysphagia following cerebrovascular accident  (CVA) Continues to work with [place] and is currently tolerating a mech soft diet with NTL   6. 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 Will complete 3 months of asa and plavix and then only plavix Continues to work with OT for positioning and hand splint Recommend compression sleeve for edema   7. MCI (mild cognitive impairment) with memory loss MMSE 29/30 Noted and working with ST for cognition   8. Urinary retention Urology referral for retention   I also asked the nurse to call [facility] heart care regarding her heart monitor which is still attached. Per records she had a 30 day event monitor that has already been read with no acute events.     Family/ staff Communication: discussed with her nurse Delana Meyer and the resident   Labs/tests ordered:  CMP on Mon 7/26

## 2020-03-10 ENCOUNTER — Non-Acute Institutional Stay (SKILLED_NURSING_FACILITY): Payer: Medicare Other | Admitting: Adult Health

## 2020-03-10 ENCOUNTER — Encounter: Payer: Self-pay | Admitting: Adult Health

## 2020-03-10 DIAGNOSIS — I69354 Hemiplegia and hemiparesis following cerebral infarction affecting left non-dominant side: Secondary | ICD-10-CM | POA: Diagnosis not present

## 2020-03-10 DIAGNOSIS — R339 Retention of urine, unspecified: Secondary | ICD-10-CM

## 2020-03-10 DIAGNOSIS — R1013 Epigastric pain: Secondary | ICD-10-CM

## 2020-03-10 DIAGNOSIS — I69391 Dysphagia following cerebral infarction: Secondary | ICD-10-CM | POA: Diagnosis not present

## 2020-03-10 DIAGNOSIS — I6359 Cerebral infarction due to unspecified occlusion or stenosis of other cerebral artery: Secondary | ICD-10-CM | POA: Diagnosis not present

## 2020-03-10 DIAGNOSIS — K6289 Other specified diseases of anus and rectum: Secondary | ICD-10-CM

## 2020-03-10 NOTE — Progress Notes (Signed)
Location:  Occupational psychologist of Service:  SNF (31) Provider:   Cindi Carbon, ANP [place] [phone]   Gayland Curry, DO  Patient Care Team: Gayland Curry, DO as PCP - General (Geriatric Medicine) Wylene Simmer, MD as Consulting Physician (Orthopedic Surgery) Cameron Sprang, MD as Consulting Physician (Neurology)  Extended Emergency Contact Information Primary Emergency Contact: [facility] Address: [address]          [address]          [place], [place] [address] [place] Phone: [phone] Mobile Phone: [phone] Relation: Mother Secondary Emergency Contact: deepika, [place] Mobile Phone: [phone] Relation: Brother  Code Status:  Full code  Goals of care: Advanced Directive information Advanced Directives 01/12/2020  Does Patient Have a Medical Advance Directive? No  Type of Advance Directive -  Does patient want to make changes to medical advance directive? No - Patient declined  Would patient like information on creating a medical advance directive? -     Chief Complaint  Patient presents with  . Medical Management of Chronic Issues    HPI:  Pt is a 74 y.o. female seen today for medical management of chronic diseases.    She had a CVA with left sided weakness in April of 2021 and continues to work with therapy. She remains on a hoyer lift. Wears a splint with compression sleeve to the left side. She was on dual DAPT and then after three months can be on plavix alone.   She reports that she is doing well with chewing and swallowing but has to take her time. Currently on a D3 diet with NTL but allowed to have thin water in between meals.   Treated for proctitis with cipro and flagyl. Epigastric pain is better but continues with mild rectal pain at night and "gassy" feeling rectum".  The staff report there is no diarrhea and stools are soft formed and regular. No fever.   Due for  urology f/u due to foley cath for retention but her apt was canceled due to a covid exposure. So far she is doing well with no symptoms and a negative swab.   We discussed that it has been difficult for her to transition to skilled care and depend on the staff. She feels sad at times but denies depression. Sleeps well and appetite is fair.    Past Medical History:  Diagnosis Date  . Anxiety   . Aortic atherosclerosis ([place])   . Arthritis   . Complication of anesthesia    per pt, slow to wake up past sedation.  . CVA (cerebral vascular accident) ([place]) 11/10/2019  . Depression   . Hyperplastic colon polyp   . Hypertension   . Neuropathy   . Osteoarthritis   . Osteoporosis   . Stomach upset    has a senstive stomach  . Throat clearing    has a cough at times  . Urinary incontinence   . Vitamin D deficiency    Past Surgical History:  Procedure Laterality Date  . COLONOSCOPY    . FACIAL COSMETIC SURGERY     over 15 years ago  . FINGER TENDON REPAIR     middle rt hand-  . HAMMER TOE SURGERY  12/27/2011   Procedure: HAMMER TOE CORRECTION;  Surgeon: Wylene Simmer, MD;  Location: [facility];  Service: Orthopedics;  Laterality: Left;  2-4th    Allergies  Allergen Reactions  .  Chlorhexidine     Outpatient Encounter Medications as of 03/10/2020  Medication Sig  . aspirin 81 MG tablet Take 81 mg by mouth daily.  Marland Kitchen atorvastatin (LIPITOR) 40 MG tablet Take 1 tablet (40 mg total) by mouth daily.  . bisacodyl (BISACODYL) 5 MG EC tablet Take 5 mg by mouth daily as needed for mild constipation or moderate constipation.  . bisacodyl (DULCOLAX) 10 MG suppository If not relieved by MOM, give 10 mg Bisacodyl suppositiory rectally X 1 dose in 24 hours as needed (Do not use constipation standing orders for residents with renal failure/CFR less than 30. Contact MD for orders) (Physician Order)  . clopidogrel (PLAVIX) 75 MG tablet Take 1 tablet (75 mg total) by mouth daily.  Marland Kitchen  dicyclomine (BENTYL) 10 MG capsule Take 10 mg by mouth 3 (three) times daily as needed for spasms.  . magnesium hydroxide (MILK OF MAGNESIA) 400 MG/5ML suspension If no BM in 3 days, give 30 cc Milk of Magnesium p.o. x 1 dose in 24 hours as needed (Do not use standing constipation orders for residents with renal failure CFR less than 30. Contact MD for orders) (Physician Order)  . metoprolol tartrate (LOPRESSOR) 25 MG tablet Take 1 tablet (25 mg total) by mouth 2 (two) times daily.  . NON FORMULARY Diet: Mech Soft, no breads. Thin Liquids  . NON FORMULARY 4oz Medpass d/t Dx Malnutrition/poor intake.  . pantoprazole (PROTONIX) 20 MG tablet Take 20 mg by mouth daily.  . simethicone (MYLICON) 80 MG chewable tablet Chew 1 tablet (80 mg total) by mouth every 8 (eight) hours as needed for flatulence.  . Sodium Phosphates (RA SALINE ENEMA RE) If not relieved by Biscodyl suppository, give disposable Saline Enema rectally X 1 dose/24 hrs as needed (Do not use constipation standing orders for residents with renal failure/CFR less than 30. Contact MD for orders)(Physician Or  . acetaminophen (TYLENOL) 325 MG tablet Take 650 mg by mouth at bedtime. And q 6 hrs prn  . [DISCONTINUED] hydrocortisone cream 1 % Apply 1 application topically 2 (two) times daily.  . [DISCONTINUED] omeprazole (PRILOSEC) 20 MG capsule Take 20 mg by mouth daily.   No facility-administered encounter medications on file as of 03/10/2020.    Review of Systems  Constitutional: Negative for chills, diaphoresis and fever.  HENT: Positive for trouble swallowing (mild).   Respiratory: Negative for cough and wheezing.   Cardiovascular: Negative for chest pain and leg swelling.  Gastrointestinal: Positive for rectal pain. Negative for abdominal pain, anal bleeding, blood in stool, constipation, diarrhea, nausea and vomiting.  Genitourinary: Negative for dysuria and urgency.  Musculoskeletal: Positive for gait problem. Negative for back pain,  myalgias and neck pain.       Left arm swelling   Skin: Negative for rash.  Neurological: Positive for speech difficulty and weakness. Negative for dizziness.  Psychiatric/Behavioral: Negative for agitation, behavioral problems, confusion, dysphoric mood, sleep disturbance and suicidal ideas. The patient is not nervous/anxious.     Immunization History  Administered Date(s) Administered  . Fluad Quad(high Dose 65+) 04/27/2019  . Influenza, High Dose Seasonal PF 05/03/2017, 05/19/2018  . Influenza,inj,Quad PF,6+ Mos 04/29/2013, 05/18/2014, 05/02/2015, 04/17/2016  . PFIZER SARS-COV-2 Vaccination 09/21/2019  . Pneumococcal Conjugate-13 05/11/2013  . Pneumococcal Polysaccharide-23 05/02/2015   Pertinent  Health Maintenance Due  Topic Date Due  . MAMMOGRAM  01/09/2019  . INFLUENZA VACCINE  02/21/2020  . COLONOSCOPY  03/11/2024  . PNA vac Low Risk Adult  Completed  . DEXA SCAN  Discontinued  Fall Risk  12/07/2019 04/27/2019 08/05/2018 06/17/2018 12/25/2017  Falls in the past year? 0 1 0 0 No  Number falls in past yr: 0 0 - 0 -  Injury with Fall? 0 1 - 0 -   Functional Status Survey:    Vitals:   03/10/20 1110  Weight: 113 lb 8 oz (51.5 kg)   Body mass index is 20.11 kg/m. Physical Exam Vitals and nursing note reviewed.  Constitutional:      General: She is not in acute distress.    Appearance: She is not diaphoretic.  HENT:     Head: Normocephalic and atraumatic.     Nose: Nose normal.     Mouth/Throat:     Mouth: Mucous membranes are moist.     Pharynx: Oropharynx is clear.  Eyes:     General:        Right eye: No discharge.        Left eye: No discharge.     Conjunctiva/sclera: Conjunctivae normal.     Pupils: Pupils are equal, round, and reactive to light.  Neck:     Vascular: No carotid bruit or JVD.  Cardiovascular:     Rate and Rhythm: Normal rate and regular rhythm.     Heart sounds: No murmur heard.   Pulmonary:     Effort: Pulmonary effort is normal. No  respiratory distress.     Breath sounds: Normal breath sounds. No wheezing.     Comments: Firm notch at the sternum, not tender.  Abdominal:     General: Abdomen is flat. Bowel sounds are normal. There is no distension.     Palpations: Abdomen is soft. There is no mass.     Tenderness: There is no abdominal tenderness. There is no right CVA tenderness, left CVA tenderness, guarding or rebound.     Hernia: No hernia is present.  Musculoskeletal:        General: Swelling (mild left arm, no erythema) present.     Cervical back: No rigidity or tenderness.     Right lower leg: No edema.     Left lower leg: No edema.  Lymphadenopathy:     Cervical: No cervical adenopathy.  Skin:    General: Skin is warm and dry.  Neurological:     Mental Status: She is alert and oriented to person, place, and time.     Comments: Left sided weakness.   Psychiatric:        Mood and Affect: Mood normal.     Labs reviewed: Recent Labs    11/18/19 0343 11/19/19 0240 11/21/19 0308 11/21/19 0308 11/22/19 0502 11/22/19 0502 11/23/19 0356 11/25/19 0000 12/17/19 0500 01/01/20 1255 01/18/20 0000  NA 139   < > 147*   < > 144   < > 141   < > 136* 141 140  K 4.4   < > 3.6   < > 3.2*   < > 3.8   < > 3.6 3.7 3.9  CL 108   < > 112*   < > 112*   < > 110   < > 102 105 103  CO2 12*   < > 27   < > 25   < > 23   < > 21 21 24*  GLUCOSE 123*   < > 110*  --  113*  --  102*  --   --   --   --   BUN 21   < > 29*   < >  25*   < > 17   < > 15 13 15   CREATININE 0.62   < > 0.65   < > 0.60   < > 0.56   < > 0.4* 0.4* 0.3*  CALCIUM 9.1   < > 9.2   < > 8.8*   < > 8.8*   < > 9.0 9.9 10.4  MG 2.0  --  2.2  --   --   --  1.8  --   --   --   --   PHOS  --   --  2.8  --   --   --   --   --   --   --   --    < > = values in this interval not displayed.   Recent Labs    11/20/19 0332 11/20/19 0332 11/21/19 0308 11/21/19 0308 11/22/19 0502 11/22/19 0502 12/17/19 0500 12/24/19 0000 12/24/19 1255 01/18/20 0000  AST 30   <  > 26   < > 22   < > 22 23  --  63*  ALT 66*   < > 51*   < > 37   < > 28 38*  --  105*  ALKPHOS 80   < > 77  --  66  --   --   --   --  108  BILITOT 0.6  --  0.5  --  0.4  --   --   --   --   --   PROT 5.7*  --  5.7*  --  5.0*  --   --   --   --   --   ALBUMIN 2.3*   < > 2.4*   < > 2.1*  --  3.1*  --  3.2*  --    < > = values in this interval not displayed.   Recent Labs    11/21/19 0308 11/21/19 0308 11/22/19 0502 11/22/19 0502 11/23/19 0356 11/25/19 0000 12/17/19 0500 01/01/20 0000 01/18/20 0000  WBC 12.9*   < > 13.9*   < > 13.0*   < > 11.2 12.8 8.4  NEUTROABS 10.2*  --  11.1*  --  10.5*  --   --   --   --   HGB 14.0   < > 12.4   < > 13.0   < > 12.6 14.0 13.8  HCT 44.6   < > 40.0   < > 40.2   < > 38 43 42  MCV 98.0  --  98.8  --  96.6  --   --   --   --   PLT 442*   < > 411*   < > 431*   < > 319 318 318   < > = values in this interval not displayed.   Lab Results  Component Value Date   TSH 1.728 11/10/2019   Lab Results  Component Value Date   HGBA1C 5.1 11/11/2019   Lab Results  Component Value Date   CHOL 196 11/11/2019   HDL 65 11/11/2019   LDLCALC 115 (H) 11/11/2019   LDLDIRECT 116.0 04/27/2019   TRIG 78 11/11/2019   CHOLHDL 3.0 11/11/2019    Significant Diagnostic Results in last 30 days:  No results found.  Assessment/Plan  1. Proctitis Improved but not resolved.  Recommend hemorrhoid cortisone supp qhs x 7 days. If no improvement consider GI referral.   2. Cerebrovascular accident (CVA) due to occlusion of other cerebral artery (  [place]) Has completed 3 months Plavix and aspirin. Will d/c aspirin per reviewed notes from Dr. Delice Lesch.   3. Dysphagia following cerebrovascular accident (CVA) Continue D3 diet with NTL Asp prec Chew slowly and remain upright during and 30 after meals.   4. 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 Continue left arm splint and compression  sleeve.   5. Urinary retention Continues with a foley catheter with prior failed trial. Will await urology input.   6. Epigastric pain Improved with Protonix. Would continue at 20 mg qd.   Family/ staff Communication: discussed with Ms. Domagala and her nurse Jasmine   Labs/tests ordered:  NA

## 2020-04-08 ENCOUNTER — Non-Acute Institutional Stay (SKILLED_NURSING_FACILITY): Payer: Medicare Other | Admitting: Adult Health

## 2020-04-08 ENCOUNTER — Encounter: Payer: Self-pay | Admitting: Adult Health

## 2020-04-08 DIAGNOSIS — G3184 Mild cognitive impairment, so stated: Secondary | ICD-10-CM

## 2020-04-08 DIAGNOSIS — I69354 Hemiplegia and hemiparesis following cerebral infarction affecting left non-dominant side: Secondary | ICD-10-CM | POA: Diagnosis not present

## 2020-04-08 DIAGNOSIS — I69391 Dysphagia following cerebral infarction: Secondary | ICD-10-CM

## 2020-04-08 DIAGNOSIS — K6289 Other specified diseases of anus and rectum: Secondary | ICD-10-CM | POA: Diagnosis not present

## 2020-04-08 DIAGNOSIS — I10 Essential (primary) hypertension: Secondary | ICD-10-CM

## 2020-04-08 DIAGNOSIS — R339 Retention of urine, unspecified: Secondary | ICD-10-CM

## 2020-04-08 NOTE — Progress Notes (Addendum)
Location:  Occupational psychologist of Service:  SNF (31) Provider:   Cindi Carbon, ANP [place] [phone]  Gayland Curry, DO  Patient Care Team: Gayland Curry, DO as PCP - General (Geriatric Medicine) Wylene Simmer, MD as Consulting Physician (Orthopedic Surgery) Cameron Sprang, MD as Consulting Physician (Neurology)  Extended Emergency Contact Information Primary Emergency Contact: [facility] Address: [address]          [place], [place] [address] [place] Mobile Phone: [phone] Relation: Sister Interpreter needed? No Secondary Emergency Contact: Hartog,Jim  [place] Mobile Phone: [phone] Relation: Brother  Code Status:  Full code  Goals of care: Advanced Directive information Advanced Directives 01/12/2020  Does Patient Have a Medical Advance Directive? No  Type of Advance Directive -  Does patient want to make changes to medical advance directive? No - Patient declined  Would patient like information on creating a medical advance directive? -     Chief Complaint  Patient presents with  . Medical Management of Chronic Issues    HPI:  Pt is a 74 y.o. female seen today for medical management of chronic diseases.    Pt continues to report anal discomfort, pain, and burning No bleeding or hemorrhoids noted. She denies any abd pain at this time. In prior visits she has had epigastric/abd discomfort but this has resolved. No nausea or vomiting. No diarrhea or constipation.  In August we tried hemorrhoid supp each night for 7 nights but she did not feel it helped.  CT if the abd on 01/21/20 showed signs of fecal impaction and serocoral colitis. Degree of distention worsening proctitis. She was treated with Cipro and Flagyl on 7/2, as well as enema.    She has a first degree relative with colon cancer. Colonoscopy and endoscopy in Aug of 2020 were unremarkable for cancer. Esophagitis and  hiatal hernia were noted.    CVA with left sided weakness: working with therapy to self propel wheelchair. Remains a hoyer lift  No issues with chewing or swallowing  Continues with a catheter, possible voiding trial next month per urology   Past Medical History:  Diagnosis Date  . Anxiety   . Aortic atherosclerosis ([place])   . Arthritis   . Complication of anesthesia    per pt, slow to wake up past sedation.  . CVA (cerebral vascular accident) ([place]) 11/10/2019  . Depression   . Hyperplastic colon polyp   . Hypertension   . Neuropathy   . Osteoarthritis   . Osteoporosis   . Stomach upset    has a senstive stomach  . Throat clearing    has a cough at times  . Urinary incontinence   . Vitamin D deficiency    Past Surgical History:  Procedure Laterality Date  . COLONOSCOPY    . FACIAL COSMETIC SURGERY     over 15 years ago  . FINGER TENDON REPAIR     middle rt hand-  . HAMMER TOE SURGERY  12/27/2011   Procedure: HAMMER TOE CORRECTION;  Surgeon: Wylene Simmer, MD;  Location: [facility];  Service: Orthopedics;  Laterality: Left;  2-4th    Allergies  Allergen Reactions  . Chlorhexidine     Outpatient Encounter Medications as of 04/08/2020  Medication Sig  . acetaminophen (TYLENOL) 325 MG tablet Take 650 mg by mouth at bedtime. And q 6 hrs prn  . atorvastatin (LIPITOR) 40 MG tablet Take 1 tablet (40 mg  total) by mouth daily.  . bisacodyl (BISACODYL) 5 MG EC tablet Take 5 mg by mouth daily as needed for mild constipation or moderate constipation.  . bisacodyl (DULCOLAX) 10 MG suppository If not relieved by MOM, give 10 mg Bisacodyl suppositiory rectally X 1 dose in 24 hours as needed (Do not use constipation standing orders for residents with renal failure/CFR less than 30. Contact MD for orders) (Physician Order)  . clopidogrel (PLAVIX) 75 MG tablet Take 1 tablet (75 mg total) by mouth daily.  Marland Kitchen dicyclomine (BENTYL) 10 MG capsule Take 10 mg by mouth 3 (three)  times daily as needed for spasms.  . magnesium hydroxide (MILK OF MAGNESIA) 400 MG/5ML suspension If no BM in 3 days, give 30 cc Milk of Magnesium p.o. x 1 dose in 24 hours as needed (Do not use standing constipation orders for residents with renal failure CFR less than 30. Contact MD for orders) (Physician Order)  . metoprolol tartrate (LOPRESSOR) 25 MG tablet Take 1 tablet (25 mg total) by mouth 2 (two) times daily.  . NON FORMULARY Diet: Mech Soft, no breads. Thin Liquids  . NON FORMULARY 4oz Medpass d/t Dx Malnutrition/poor intake.  . pantoprazole (PROTONIX) 20 MG tablet Take 20 mg by mouth daily.  . simethicone (MYLICON) 80 MG chewable tablet Chew 1 tablet (80 mg total) by mouth every 8 (eight) hours as needed for flatulence.  . Sodium Phosphates (RA SALINE ENEMA RE) If not relieved by Biscodyl suppository, give disposable Saline Enema rectally X 1 dose/24 hrs as needed (Do not use constipation standing orders for residents with renal failure/CFR less than 30. Contact MD for orders)(Physician Or  . [DISCONTINUED] aspirin 81 MG tablet Take 81 mg by mouth daily.   No facility-administered encounter medications on file as of 04/08/2020.    Review of Systems  Constitutional: Negative for activity change, appetite change, chills, diaphoresis, fatigue, fever and unexpected weight change.  HENT: Negative for congestion.   Respiratory: Negative for cough, shortness of breath and wheezing.   Cardiovascular: Negative for chest pain, palpitations and leg swelling.  Gastrointestinal: Positive for rectal pain (burning more than pain). Negative for abdominal distention, abdominal pain, anal bleeding, blood in stool, constipation, diarrhea, nausea and vomiting.  Genitourinary: Negative for difficulty urinating and dysuria.  Musculoskeletal: Positive for gait problem. Negative for arthralgias, back pain, joint swelling and myalgias.  Skin: Negative for wound.  Neurological: Positive for weakness. Negative  for dizziness, tremors, seizures, syncope, facial asymmetry, speech difficulty, light-headedness, numbness and headaches.  Psychiatric/Behavioral: Negative for agitation, behavioral problems and confusion.    Immunization History  Administered Date(s) Administered  . Fluad Quad(high Dose 65+) 04/27/2019  . Influenza, High Dose Seasonal PF 05/03/2017, 05/19/2018  . Influenza,inj,Quad PF,6+ Mos 04/29/2013, 05/18/2014, 05/02/2015, 04/17/2016  . PFIZER SARS-COV-2 Vaccination 09/21/2019  . Pneumococcal Conjugate-13 05/11/2013  . Pneumococcal Polysaccharide-23 05/02/2015   Pertinent  Health Maintenance Due  Topic Date Due  . MAMMOGRAM  01/09/2019  . INFLUENZA VACCINE  02/21/2020  . COLONOSCOPY  03/11/2024  . PNA vac Low Risk Adult  Completed  . DEXA SCAN  Discontinued   Fall Risk  12/07/2019 04/27/2019 08/05/2018 06/17/2018 12/25/2017  Falls in the past year? 0 1 0 0 No  Number falls in past yr: 0 0 - 0 -  Injury with Fall? 0 1 - 0 -   Functional Status Survey:    Vitals:   04/08/20 1135  Weight: 114 lb 6.4 oz (51.9 kg)   Body mass index is 20.27  kg/m. Physical Exam Vitals and nursing note reviewed.  Constitutional:      General: She is not in acute distress.    Appearance: She is not diaphoretic.  HENT:     Head: Normocephalic and atraumatic.     Left Ear: Tympanic membrane normal.     Nose: No congestion.  Eyes:     Conjunctiva/sclera: Conjunctivae normal.     Pupils: Pupils are equal, round, and reactive to light.  Neck:     Thyroid: No thyromegaly.     Vascular: No carotid bruit or JVD.  Cardiovascular:     Rate and Rhythm: Normal rate.     Heart sounds: Normal heart sounds. No murmur heard.   Pulmonary:     Effort: Pulmonary effort is normal. No respiratory distress.     Breath sounds: Normal breath sounds. No stridor.  Abdominal:     General: Bowel sounds are normal. There is no distension.     Palpations: Abdomen is soft. There is no mass.     Tenderness: There  is no abdominal tenderness. There is no guarding or rebound.     Hernia: No hernia is present.  Genitourinary:    Vagina: No vaginal discharge.     Comments: No hemorrhoid noted internal or external. Rectum is tender on exam. No irritation or bleeding. Musculoskeletal:     Cervical back: No rigidity. No muscular tenderness.     Right lower leg: No edema.     Left lower leg: No edema.  Lymphadenopathy:     Cervical: No cervical adenopathy.  Skin:    General: Skin is warm and dry.  Neurological:     General: No focal deficit present.     Mental Status: She is alert and oriented to person, place, and time. Mental status is at baseline.     Cranial Nerves: No cranial nerve deficit.  Psychiatric:        Mood and Affect: Mood normal.     Labs reviewed: Recent Labs    11/18/19 0343 11/19/19 0240 11/21/19 0308 11/21/19 0308 11/22/19 0502 11/22/19 0502 11/23/19 0356 11/25/19 0000 12/17/19 0500 01/01/20 1255 01/18/20 0000  NA 139   < > 147*   < > 144   < > 141   < > 136* 141 140  K 4.4   < > 3.6   < > 3.2*   < > 3.8   < > 3.6 3.7 3.9  CL 108   < > 112*   < > 112*   < > 110   < > 102 105 103  CO2 12*   < > 27   < > 25   < > 23   < > 21 21 24*  GLUCOSE 123*   < > 110*  --  113*  --  102*  --   --   --   --   BUN 21   < > 29*   < > 25*   < > 17   < > 15 13 15   CREATININE 0.62   < > 0.65   < > 0.60   < > 0.56   < > 0.4* 0.4* 0.3*  CALCIUM 9.1   < > 9.2   < > 8.8*   < > 8.8*   < > 9.0 9.9 10.4  MG 2.0  --  2.2  --   --   --  1.8  --   --   --   --  PHOS  --   --  2.8  --   --   --   --   --   --   --   --    < > = values in this interval not displayed.   Recent Labs    11/20/19 0332 11/20/19 0332 11/21/19 0308 11/21/19 0308 11/22/19 0502 11/22/19 0502 12/17/19 0500 12/24/19 0000 12/24/19 1255 01/18/20 0000  AST 30   < > 26   < > 22   < > 22 23  --  63*  ALT 66*   < > 51*   < > 37   < > 28 38*  --  105*  ALKPHOS 80   < > 77  --  66  --   --   --   --  108  BILITOT 0.6   --  0.5  --  0.4  --   --   --   --   --   PROT 5.7*  --  5.7*  --  5.0*  --   --   --   --   --   ALBUMIN 2.3*   < > 2.4*   < > 2.1*  --  3.1*  --  3.2*  --    < > = values in this interval not displayed.   Recent Labs    11/21/19 0308 11/21/19 0308 11/22/19 0502 11/22/19 0502 11/23/19 0356 11/25/19 0000 12/17/19 0500 01/01/20 0000 01/18/20 0000  WBC 12.9*   < > 13.9*   < > 13.0*   < > 11.2 12.8 8.4  NEUTROABS 10.2*  --  11.1*  --  10.5*  --   --   --   --   HGB 14.0   < > 12.4   < > 13.0   < > 12.6 14.0 13.8  HCT 44.6   < > 40.0   < > 40.2   < > 38 43 42  MCV 98.0  --  98.8  --  96.6  --   --   --   --   PLT 442*   < > 411*   < > 431*   < > 319 318 318   < > = values in this interval not displayed.   Lab Results  Component Value Date   TSH 1.728 11/10/2019   Lab Results  Component Value Date   HGBA1C 5.1 11/11/2019   Lab Results  Component Value Date   CHOL 196 11/11/2019   HDL 65 11/11/2019   LDLCALC 115 (H) 11/11/2019   LDLDIRECT 116.0 04/27/2019   TRIG 78 11/11/2019   CHOLHDL 3.0 11/11/2019    Significant Diagnostic Results in last 30 days:  No results found.  Assessment/Plan 1. Proctitis Will try mesalamine supp 1 gram qhs x 4 weeks. If no improvement after 2 weeks would recommend f/u with GI (pt has difficulty getting out of the facility and up on an exam table due to her non ambulatory status.   2. MCI (mild cognitive impairment) with memory loss MMSE 29/30 01/25/20 Doing well in skilled care.   3. 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 Continue compression sleeve and splint with support in chair.   4. Dysphagia following cerebrovascular accident (CVA) Continue modified diet and asp prec D 3 NTL  5. Essential hypertension Controlled  6. Urinary retention Continues with foley, no current symptoms F/U with urology Oct  Family/ staff Communication: resident   Labs/tests  ordered:  NA

## 2020-05-12 ENCOUNTER — Encounter: Payer: Self-pay | Admitting: Adult Health

## 2020-05-12 ENCOUNTER — Encounter: Payer: Self-pay | Admitting: Internal Medicine

## 2020-05-12 ENCOUNTER — Non-Acute Institutional Stay (SKILLED_NURSING_FACILITY): Payer: Medicare Other | Admitting: Adult Health

## 2020-05-12 DIAGNOSIS — E782 Mixed hyperlipidemia: Secondary | ICD-10-CM

## 2020-05-12 DIAGNOSIS — I10 Essential (primary) hypertension: Secondary | ICD-10-CM

## 2020-05-12 DIAGNOSIS — K6289 Other specified diseases of anus and rectum: Secondary | ICD-10-CM | POA: Diagnosis not present

## 2020-05-12 DIAGNOSIS — R339 Retention of urine, unspecified: Secondary | ICD-10-CM | POA: Diagnosis not present

## 2020-05-12 DIAGNOSIS — I69354 Hemiplegia and hemiparesis following cerebral infarction affecting left non-dominant side: Secondary | ICD-10-CM

## 2020-05-12 DIAGNOSIS — R32 Unspecified urinary incontinence: Secondary | ICD-10-CM

## 2020-05-12 NOTE — Progress Notes (Signed)
Location:  Occupational psychologist of Service:  SNF (31) Provider:   Cindi Carbon, ANP [place] [phone]   Gayland Curry, DO  Patient Care Team: Gayland Curry, DO as PCP - General (Geriatric Medicine) Wylene Simmer, MD as Consulting Physician (Orthopedic Surgery) Cameron Sprang, MD as Consulting Physician (Neurology)  Extended Emergency Contact Information Primary Emergency Contact: [facility] Address: [address]          [place], [place] [address] Johnnette Litter of [place] Mobile Phone: [phone] Relation: Sister Interpreter needed? No Secondary Emergency Contact: Nolting,Jim  [place] Mobile Phone: [phone] Relation: Brother  Code Status:  Full code  Goals of care: Advanced Directive information Advanced Directives 01/12/2020  Does Patient Have a Medical Advance Directive? No  Type of Advance Directive -  Does patient want to make changes to medical advance directive? No - Patient declined  Would patient like information on creating a medical advance directive? -     Chief Complaint  Patient presents with  . Medical Management of Chronic Issues    HPI:  Pt is a 74 y.o. female seen today for medical management of chronic diseases.  Ms. C came to [facility] after a CVA with residual left sided weakness and dysphagia.    Rectal pain: CT if the abd on 01/21/20 showed signs of fecal impaction and serocoral colitis. Degree of distention worsening proctitis.  She used the mesalamine supp for one month and felt she saw a small amt of benefit but during that time she developed a rash on her bottom which was also uncomfortable. She tends to have rectal pain at night. She is not having any abd pain, nausea, constipation, diarrhea, fever, or bloody stools. Interestingly her sister is on oral mesalamine for colitis.  She wants to see GI but can't see Dr. Hilarie Fredrickson until Jan and she does not want to see anyone else. The  nurses are apply 1 2 3  cream to her bottom for redness which is helping.   Foley was removed for urinary retention and she is now voiding. A voiding scheduled of q 90-120 min was recommended by urology but she states she is incontinent and its too much trouble to be hoyer lifted to the bedpan that often. She is not having any bladder pain, frequency or dysuria.    Past Medical History:  Diagnosis Date  . Anxiety   . Aortic atherosclerosis ([place])   . Arthritis   . Complication of anesthesia    per pt, slow to wake up past sedation.  . CVA (cerebral vascular accident) ([place]) 11/10/2019  . Depression   . Hyperplastic colon polyp   . Hypertension   . Neuropathy   . Osteoarthritis   . Osteoporosis   . Stomach upset    has a senstive stomach  . Throat clearing    has a cough at times  . Urinary incontinence   . Vitamin D deficiency    Past Surgical History:  Procedure Laterality Date  . COLONOSCOPY    . FACIAL COSMETIC SURGERY     over 15 years ago  . FINGER TENDON REPAIR     middle rt hand-  . HAMMER TOE SURGERY  12/27/2011   Procedure: HAMMER TOE CORRECTION;  Surgeon: Wylene Simmer, MD;  Location: [facility];  Service: Orthopedics;  Laterality: Left;  2-4th    Allergies  Allergen Reactions  . Chlorhexidine     Outpatient Encounter Medications as of 05/12/2020  Medication Sig  . acetaminophen (TYLENOL) 325 MG tablet Take 650 mg by mouth at bedtime. And q 6 hrs prn  . atorvastatin (LIPITOR) 40 MG tablet Take 1 tablet (40 mg total) by mouth daily.  . bisacodyl (BISACODYL) 5 MG EC tablet Take 5 mg by mouth daily as needed for mild constipation or moderate constipation.  . bisacodyl (DULCOLAX) 10 MG suppository If not relieved by MOM, give 10 mg Bisacodyl suppositiory rectally X 1 dose in 24 hours as needed (Do not use constipation standing orders for residents with renal failure/CFR less than 30. Contact MD for orders) (Physician Order)  . clopidogrel (PLAVIX) 75 MG  tablet Take 1 tablet (75 mg total) by mouth daily.  Marland Kitchen dicyclomine (BENTYL) 10 MG capsule Take 10 mg by mouth 3 (three) times daily as needed for spasms.  . magnesium hydroxide (MILK OF MAGNESIA) 400 MG/5ML suspension If no BM in 3 days, give 30 cc Milk of Magnesium p.o. x 1 dose in 24 hours as needed (Do not use standing constipation orders for residents with renal failure CFR less than 30. Contact MD for orders) (Physician Order)  . metoprolol tartrate (LOPRESSOR) 25 MG tablet Take 1 tablet (25 mg total) by mouth 2 (two) times daily.  . NON FORMULARY Diet: Mech Soft, no breads. Thin Liquids  . NON FORMULARY 4oz Medpass d/t Dx Malnutrition/poor intake.  . pantoprazole (PROTONIX) 20 MG tablet Take 20 mg by mouth daily.  . simethicone (MYLICON) 80 MG chewable tablet Chew 1 tablet (80 mg total) by mouth every 8 (eight) hours as needed for flatulence.  . Sodium Phosphates (RA SALINE ENEMA RE) If not relieved by Biscodyl suppository, give disposable Saline Enema rectally X 1 dose/24 hrs as needed (Do not use constipation standing orders for residents with renal failure/CFR less than 30. Contact MD for orders)(Physician Or   No facility-administered encounter medications on file as of 05/12/2020.    Review of Systems  Constitutional: Negative for activity change, appetite change, chills, diaphoresis, fatigue, fever and unexpected weight change.  HENT: Positive for trouble swallowing. Negative for congestion.   Respiratory: Negative for cough, shortness of breath and wheezing.   Cardiovascular: Negative for chest pain, palpitations and leg swelling.  Gastrointestinal: Positive for rectal pain. Negative for abdominal distention, abdominal pain, anal bleeding, blood in stool, constipation, diarrhea, nausea and vomiting.  Genitourinary: Negative for difficulty urinating and dysuria.  Musculoskeletal: Positive for gait problem. Negative for arthralgias, back pain, joint swelling and myalgias.  Skin:  Positive for rash (buttocks improving ).  Neurological: Negative for dizziness, tremors, seizures, syncope, facial asymmetry, speech difficulty (dysphonia), weakness (left), light-headedness, numbness and headaches.  Psychiatric/Behavioral: Negative for agitation, behavioral problems and confusion.       Memory loss    Immunization History  Administered Date(s) Administered  . Fluad Quad(high Dose 65+) 04/27/2019  . Influenza, High Dose Seasonal PF 05/03/2017, 05/19/2018  . Influenza,inj,Quad PF,6+ Mos 04/29/2013, 05/18/2014, 05/02/2015, 04/17/2016  . PFIZER SARS-COV-2 Vaccination 09/21/2019  . Pneumococcal Conjugate-13 05/11/2013  . Pneumococcal Polysaccharide-23 05/02/2015   Pertinent  Health Maintenance Due  Topic Date Due  . MAMMOGRAM  01/09/2019  . INFLUENZA VACCINE  02/21/2020  . COLONOSCOPY  03/11/2024  . PNA vac Low Risk Adult  Completed  . DEXA SCAN  Discontinued   Fall Risk  12/07/2019 04/27/2019 08/05/2018 06/17/2018 12/25/2017  Falls in the past year? 0 1 0 0 No  Number falls in past yr: 0 0 - 0 -  Injury with Fall? 0 1 -  0 -   Functional Status Survey:    Vitals:   05/12/20 1609  Weight: 111 lb 14.4 oz (50.8 kg)   Body mass index is 19.82 kg/m. Physical Exam Vitals and nursing note reviewed.  Constitutional:      General: She is not in acute distress.    Appearance: She is not diaphoretic.  HENT:     Head: Normocephalic and atraumatic.  Neck:     Vascular: No JVD.  Cardiovascular:     Rate and Rhythm: Normal rate and regular rhythm.     Heart sounds: No murmur heard.   Pulmonary:     Effort: Pulmonary effort is normal. No respiratory distress.     Breath sounds: Normal breath sounds. No wheezing.  Abdominal:     General: Bowel sounds are normal. There is no distension.     Palpations: Abdomen is soft.     Tenderness: There is no abdominal tenderness. There is no right CVA tenderness or left CVA tenderness.  Musculoskeletal:     Right lower leg: No  edema.     Left lower leg: No edema.  Skin:    General: Skin is warm and dry.     Comments: Mild erythema to buttocks  Neurological:     Mental Status: She is alert and oriented to person, place, and time. Mental status is at baseline.     Comments: Left sided weakness unchanged  Psychiatric:        Mood and Affect: Mood normal.     Labs reviewed: Recent Labs    11/18/19 0343 11/19/19 0240 11/21/19 0308 11/21/19 0308 11/22/19 0502 11/22/19 0502 11/23/19 0356 11/25/19 0000 12/17/19 0500 01/01/20 1255 01/18/20 0000  NA 139   < > 147*   < > 144   < > 141   < > 136* 141 140  K 4.4   < > 3.6   < > 3.2*   < > 3.8   < > 3.6 3.7 3.9  CL 108   < > 112*   < > 112*   < > 110   < > 102 105 103  CO2 12*   < > 27   < > 25   < > 23   < > 21 21 24*  GLUCOSE 123*   < > 110*  --  113*  --  102*  --   --   --   --   BUN 21   < > 29*   < > 25*   < > 17   < > 15 13 15   CREATININE 0.62   < > 0.65   < > 0.60   < > 0.56   < > 0.4* 0.4* 0.3*  CALCIUM 9.1   < > 9.2   < > 8.8*   < > 8.8*   < > 9.0 9.9 10.4  MG 2.0  --  2.2  --   --   --  1.8  --   --   --   --   PHOS  --   --  2.8  --   --   --   --   --   --   --   --    < > = values in this interval not displayed.   Recent Labs    11/20/19 0332 11/20/19 0332 11/21/19 0308 11/21/19 0308 11/22/19 0502 11/22/19 0502 12/17/19 0500 12/24/19 0000 12/24/19 1255 01/18/20 0000  AST 30   < >  26   < > 22   < > 22 23  --  63*  ALT 66*   < > 51*   < > 37   < > 28 38*  --  105*  ALKPHOS 80   < > 77  --  66  --   --   --   --  108  BILITOT 0.6  --  0.5  --  0.4  --   --   --   --   --   PROT 5.7*  --  5.7*  --  5.0*  --   --   --   --   --   ALBUMIN 2.3*   < > 2.4*   < > 2.1*  --  3.1*  --  3.2*  --    < > = values in this interval not displayed.   Recent Labs    11/21/19 0308 11/21/19 0308 11/22/19 0502 11/22/19 0502 11/23/19 0356 11/25/19 0000 12/17/19 0500 01/01/20 0000 01/18/20 0000  WBC 12.9*   < > 13.9*   < > 13.0*   < > 11.2 12.8  8.4  NEUTROABS 10.2*  --  11.1*  --  10.5*  --   --   --   --   HGB 14.0   < > 12.4   < > 13.0   < > 12.6 14.0 13.8  HCT 44.6   < > 40.0   < > 40.2   < > 38 43 42  MCV 98.0  --  98.8  --  96.6  --   --   --   --   PLT 442*   < > 411*   < > 431*   < > 319 318 318   < > = values in this interval not displayed.   Lab Results  Component Value Date   TSH 1.728 11/10/2019   Lab Results  Component Value Date   HGBA1C 5.1 11/11/2019   Lab Results  Component Value Date   CHOL 196 11/11/2019   HDL 65 11/11/2019   LDLCALC 115 (H) 11/11/2019   LDLDIRECT 116.0 04/27/2019   TRIG 78 11/11/2019   CHOLHDL 3.0 11/11/2019    Significant Diagnostic Results in last 30 days:  No results found.  Assessment/Plan 1. Proctitis Continues with periodic rectal pain.  Restart the mesalamine 1 gram bid supp x 1 month then reduce to 1 gram daily until seen by GI   2. Urinary retention Resolved   3. Incontinence in female Recommend trying the voiding scheduled recommended by urology q 29min-2hrs  If feasible  4. 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 Primary hypertension Controlled  Continue Lopressor 25 mg bid   6. Mixed hyperlipidemia Lab Results  Component Value Date   LDLCALC 115 (H) 11/11/2019   Continue Lipitor 40 mg qd, needs f/u lipid panel at neuro visit or we can draw here    Family/ staff Communication: resident and her nurse

## 2020-05-23 ENCOUNTER — Non-Acute Institutional Stay (SKILLED_NURSING_FACILITY): Payer: Medicare Other | Admitting: Adult Health

## 2020-05-23 ENCOUNTER — Encounter: Payer: Self-pay | Admitting: Internal Medicine

## 2020-05-23 DIAGNOSIS — F419 Anxiety disorder, unspecified: Secondary | ICD-10-CM | POA: Diagnosis not present

## 2020-05-23 DIAGNOSIS — F5105 Insomnia due to other mental disorder: Secondary | ICD-10-CM | POA: Diagnosis not present

## 2020-05-24 ENCOUNTER — Encounter: Payer: Self-pay | Admitting: Adult Health

## 2020-05-24 NOTE — Progress Notes (Signed)
Location:  Occupational psychologist of Service:  SNF (31) Provider:   Cindi Carbon, ANP [place] [phone]   Gayland Curry, DO  Patient Care Team: Gayland Curry, DO as PCP - General (Geriatric Medicine) Wylene Simmer, MD as Consulting Physician (Orthopedic Surgery) Cameron Sprang, MD as Consulting Physician (Neurology)  Extended Emergency Contact Information Primary Emergency Contact: [facility] Address: [address]          [place], [place] [address] Johnnette Litter of [place] Mobile Phone: [phone] Relation: Sister Interpreter needed? No Secondary Emergency Contact: Moree,Jim  [place] Mobile Phone: [phone] Relation: Brother  Code Status:  Full code  Goals of care: Advanced Directive information Advanced Directives 01/12/2020  Does Patient Have a Medical Advance Directive? No  Type of Advance Directive -  Does patient want to make changes to medical advance directive? No - Patient declined  Would patient like information on creating a medical advance directive? -     Chief Complaint  Patient presents with  . Acute Visit    sleep issues and anxiety     HPI:  Pt is a 74 y.o. female seen today for an acute visit for insomnia and anxiety. Lynn [name] has a hx of CVA with left sided weakness and moved to skilled care in June of 2021. She has experienced some feelings of anxiety in the evening and at night that effect her ability to fall asleep. She denies thoughts of self harm. Denies any change in appetite. Does have lack of motivation. She reports mild feelings of sadness about her new health status. She has some word findings issues when describing her feelings. She would like something "not too strong" to help her sleep.    Past Medical History:  Diagnosis Date  . Anxiety   . Aortic atherosclerosis ([place])   . Arthritis   . Complication of anesthesia    per pt, slow to wake up past sedation.  . CVA  (cerebral vascular accident) ([place]) 11/10/2019  . Depression   . Hyperplastic colon polyp   . Hypertension   . Neuropathy   . Osteoarthritis   . Osteoporosis   . Stomach upset    has a senstive stomach  . Throat clearing    has a cough at times  . Urinary incontinence   . Vitamin D deficiency    Past Surgical History:  Procedure Laterality Date  . COLONOSCOPY    . FACIAL COSMETIC SURGERY     over 15 years ago  . FINGER TENDON REPAIR     middle rt hand-  . HAMMER TOE SURGERY  12/27/2011   Procedure: HAMMER TOE CORRECTION;  Surgeon: Wylene Simmer, MD;  Location: [facility];  Service: Orthopedics;  Laterality: Left;  2-4th    Allergies  Allergen Reactions  . Chlorhexidine     Outpatient Encounter Medications as of 05/23/2020  Medication Sig  . Melatonin 5 MG CAPS Take 5 mg by mouth at bedtime.  . nitrofurantoin (MACRODANTIN) 100 MG capsule Take 100 mg by mouth 2 (two) times daily. UTI  . acetaminophen (TYLENOL) 325 MG tablet Take 650 mg by mouth at bedtime. And q 6 hrs prn  . atorvastatin (LIPITOR) 40 MG tablet Take 1 tablet (40 mg total) by mouth daily.  . bisacodyl (BISACODYL) 5 MG EC tablet Take 5 mg by mouth daily as needed for mild constipation or moderate constipation.  . bisacodyl (DULCOLAX) 10 MG suppository If not relieved by  MOM, give 10 mg Bisacodyl suppositiory rectally X 1 dose in 24 hours as needed (Do not use constipation standing orders for residents with renal failure/CFR less than 30. Contact MD for orders) (Physician Order)  . clopidogrel (PLAVIX) 75 MG tablet Take 1 tablet (75 mg total) by mouth daily.  Marland Kitchen dicyclomine (BENTYL) 10 MG capsule Take 10 mg by mouth 3 (three) times daily as needed for spasms.  . magnesium hydroxide (MILK OF MAGNESIA) 400 MG/5ML suspension If no BM in 3 days, give 30 cc Milk of Magnesium p.o. x 1 dose in 24 hours as needed (Do not use standing constipation orders for residents with renal failure CFR less than 30. Contact MD  for orders) (Physician Order)  . mesalamine (CANASA) 1000 MG suppository Place 1,000 mg rectally 2 (two) times daily. X 4 weeks then qhs  . metoprolol tartrate (LOPRESSOR) 25 MG tablet Take 1 tablet (25 mg total) by mouth 2 (two) times daily.  . NON FORMULARY Diet: Mech Soft, no breads. Thin Liquids  . NON FORMULARY 4oz Medpass d/t Dx Malnutrition/poor intake.  . pantoprazole (PROTONIX) 20 MG tablet Take 20 mg by mouth daily.  . simethicone (MYLICON) 80 MG chewable tablet Chew 1 tablet (80 mg total) by mouth every 8 (eight) hours as needed for flatulence.  . Sodium Phosphates (RA SALINE ENEMA RE) If not relieved by Biscodyl suppository, give disposable Saline Enema rectally X 1 dose/24 hrs as needed (Do not use constipation standing orders for residents with renal failure/CFR less than 30. Contact MD for orders)(Physician Or   No facility-administered encounter medications on file as of 05/23/2020.    Review of Systems  Unable to perform ROS: Dementia    Immunization History  Administered Date(s) Administered  . Fluad Quad(high Dose 65+) 04/27/2019  . Influenza, High Dose Seasonal PF 05/03/2017, 05/19/2018  . Influenza,inj,Quad PF,6+ Mos 04/29/2013, 05/18/2014, 05/02/2015, 04/17/2016  . PFIZER SARS-COV-2 Vaccination 09/21/2019  . Pneumococcal Conjugate-13 05/11/2013  . Pneumococcal Polysaccharide-23 05/02/2015   Pertinent  Health Maintenance Due  Topic Date Due  . MAMMOGRAM  01/09/2019  . INFLUENZA VACCINE  02/21/2020  . COLONOSCOPY  03/11/2024  . PNA vac Low Risk Adult  Completed  . DEXA SCAN  Discontinued   Fall Risk  12/07/2019 04/27/2019 08/05/2018 06/17/2018 12/25/2017  Falls in the past year? 0 1 0 0 No  Number falls in past yr: 0 0 - 0 -  Injury with Fall? 0 1 - 0 -   Functional Status Survey:    There were no vitals filed for this visit. There is no height or weight on file to calculate BMI. Physical Exam Constitutional:      Appearance: Normal appearance.   Neurological:     Mental Status: She is alert and oriented to person, place, and time.     Labs reviewed: Recent Labs    11/18/19 0343 11/19/19 0240 11/21/19 0308 11/21/19 0308 11/22/19 0502 11/22/19 0502 11/23/19 0356 11/25/19 0000 12/17/19 0500 01/01/20 1255 01/18/20 0000  NA 139   < > 147*   < > 144   < > 141   < > 136* 141 140  K 4.4   < > 3.6   < > 3.2*   < > 3.8   < > 3.6 3.7 3.9  CL 108   < > 112*   < > 112*   < > 110   < > 102 105 103  CO2 12*   < > 27   < > 25   < >  23   < > 21 21 24*  GLUCOSE 123*   < > 110*  --  113*  --  102*  --   --   --   --   BUN 21   < > 29*   < > 25*   < > 17   < > 15 13 15   CREATININE 0.62   < > 0.65   < > 0.60   < > 0.56   < > 0.4* 0.4* 0.3*  CALCIUM 9.1   < > 9.2   < > 8.8*   < > 8.8*   < > 9.0 9.9 10.4  MG 2.0  --  2.2  --   --   --  1.8  --   --   --   --   PHOS  --   --  2.8  --   --   --   --   --   --   --   --    < > = values in this interval not displayed.   Recent Labs    11/20/19 0332 11/20/19 0332 11/21/19 0308 11/21/19 0308 11/22/19 0502 11/22/19 0502 12/17/19 0500 12/24/19 0000 12/24/19 1255 01/18/20 0000  AST 30   < > 26   < > 22   < > 22 23  --  63*  ALT 66*   < > 51*   < > 37   < > 28 38*  --  105*  ALKPHOS 80   < > 77  --  66  --   --   --   --  108  BILITOT 0.6  --  0.5  --  0.4  --   --   --   --   --   PROT 5.7*  --  5.7*  --  5.0*  --   --   --   --   --   ALBUMIN 2.3*   < > 2.4*   < > 2.1*  --  3.1*  --  3.2*  --    < > = values in this interval not displayed.   Recent Labs    11/21/19 0308 11/21/19 0308 11/22/19 0502 11/22/19 0502 11/23/19 0356 11/25/19 0000 12/17/19 0500 01/01/20 0000 01/18/20 0000  WBC 12.9*   < > 13.9*   < > 13.0*   < > 11.2 12.8 8.4  NEUTROABS 10.2*  --  11.1*  --  10.5*  --   --   --   --   HGB 14.0   < > 12.4   < > 13.0   < > 12.6 14.0 13.8  HCT 44.6   < > 40.0   < > 40.2   < > 38 43 42  MCV 98.0  --  98.8  --  96.6  --   --   --   --   PLT 442*   < > 411*   < > 431*    < > 319 318 318   < > = values in this interval not displayed.   Lab Results  Component Value Date   TSH 1.728 11/10/2019   Lab Results  Component Value Date   HGBA1C 5.1 11/11/2019   Lab Results  Component Value Date   CHOL 196 11/11/2019   HDL 65 11/11/2019   LDLCALC 115 (H) 11/11/2019   LDLDIRECT 116.0 04/27/2019   TRIG 78 11/11/2019   CHOLHDL 3.0 11/11/2019  Significant Diagnostic Results in last 30 days:  No results found.  Assessment/Plan 1. Insomnia secondary to anxiety She is experiencing some anxiety related to her recent stroke, loss of independence, and new environment. She is afraid of taking SSRI's due to s/e.  We discussed that these are typically mild and the med could be stopped easily and then effects would reverse but she would rather hold off for now. She is willing to try melatonin for sleep 5 mg qhs. If no improvement she will reconsider an SSRI.     Family/ staff Communication: resident   Labs/tests ordered: NA

## 2020-06-02 ENCOUNTER — Other Ambulatory Visit: Payer: Self-pay | Admitting: Internal Medicine

## 2020-06-02 DIAGNOSIS — R1310 Dysphagia, unspecified: Secondary | ICD-10-CM

## 2020-06-09 ENCOUNTER — Other Ambulatory Visit (HOSPITAL_COMMUNITY): Payer: Self-pay | Admitting: *Deleted

## 2020-06-09 DIAGNOSIS — R1310 Dysphagia, unspecified: Secondary | ICD-10-CM

## 2020-06-10 ENCOUNTER — Other Ambulatory Visit: Payer: Medicare Other

## 2020-06-14 ENCOUNTER — Ambulatory Visit (HOSPITAL_COMMUNITY)
Admission: RE | Admit: 2020-06-14 | Discharge: 2020-06-14 | Disposition: A | Discharge status: Home / Self Care | Payer: Medicare Other | Source: Ambulatory Visit | Attending: Adult Health | Admitting: Adult Health

## 2020-06-14 ENCOUNTER — Other Ambulatory Visit: Payer: Self-pay

## 2020-06-14 DIAGNOSIS — R1310 Dysphagia, unspecified: Secondary | ICD-10-CM

## 2020-07-01 ENCOUNTER — Non-Acute Institutional Stay (SKILLED_NURSING_FACILITY): Payer: Medicare Other | Admitting: Adult Health

## 2020-07-01 DIAGNOSIS — E7800 Pure hypercholesterolemia, unspecified: Secondary | ICD-10-CM

## 2020-07-01 DIAGNOSIS — K6289 Other specified diseases of anus and rectum: Secondary | ICD-10-CM | POA: Diagnosis not present

## 2020-07-01 DIAGNOSIS — I10 Essential (primary) hypertension: Secondary | ICD-10-CM | POA: Diagnosis not present

## 2020-07-01 DIAGNOSIS — I69354 Hemiplegia and hemiparesis following cerebral infarction affecting left non-dominant side: Secondary | ICD-10-CM

## 2020-07-01 DIAGNOSIS — F419 Anxiety disorder, unspecified: Secondary | ICD-10-CM

## 2020-07-01 DIAGNOSIS — R339 Retention of urine, unspecified: Secondary | ICD-10-CM

## 2020-07-01 DIAGNOSIS — F5105 Insomnia due to other mental disorder: Secondary | ICD-10-CM

## 2020-07-04 ENCOUNTER — Encounter: Payer: Self-pay | Admitting: Adult Health

## 2020-07-04 LAB — BASIC METABOLIC PANEL
BUN: 16 (ref 4–21)
CO2: 23 — AB (ref 13–22)
Chloride: 109 — AB (ref 99–108)
Creatinine: 0.4 — AB (ref <=1.1)
Glucose: 84
Potassium: 4 (ref 3.4–5.3)
Sodium: 140 (ref 137–147)

## 2020-07-04 LAB — LIPID PANEL
Cholesterol: 112 (ref 0–200)
HDL: 56 (ref 35–70)
LDL Cholesterol: 42
Triglycerides: 69 (ref 40–160)

## 2020-07-04 LAB — COMPREHENSIVE METABOLIC PANEL: Calcium: 9.6 (ref 8.7–10.7)

## 2020-07-04 NOTE — Progress Notes (Signed)
Location:  Occupational psychologist of Service:  SNF (31) Provider:   Cindi Carbon, ANP [place] [phone]   Gayland Curry, DO  Patient Care Team: Gayland Curry, DO as PCP - General (Geriatric Medicine) Wylene Simmer, MD as Consulting Physician (Orthopedic Surgery) Cameron Sprang, MD as Consulting Physician (Neurology)  Extended Emergency Contact Information Primary Emergency Contact: [facility] Address: [address]          [place], [place] [address] Johnnette Litter of [place] Mobile Phone: [phone] Relation: Sister Interpreter needed? No Secondary Emergency Contact: Mangold,Jim  [place] Mobile Phone: [phone] Relation: Brother  Code Status:  Full  Goals of care: Advanced Directive information Advanced Directives 01/12/2020  Does Patient Have a Medical Advance Directive? No  Type of Advance Directive -  Does patient want to make changes to medical advance directive? No - Patient declined  Would patient like information on creating a medical advance directive? -     Chief Complaint  Patient presents with  . Medical Management of Chronic Issues    HPI:  Pt is a 74 y.o. female seen today for medical management of chronic diseases. Lynn [name] has a hx of CVA with left sided weakness and moved to skilled care in June of 2021. She has been working with therapy and is now able to walk short distances with assistance and has a transfer pole installed in her room for transfers. She remains incontinent but is not having any issues with retention, bladder pain, or dysuria. She denies any rectal pain and has been refusing mesalamine. CT 01/22/20 showed proctitis but this has resolved. She is on  Melatonin for insomnia and states she is sleeping better. Her overall demeanor has improved.   Past Medical History:  Diagnosis Date  . Anxiety   . Aortic atherosclerosis ([place])   . Arthritis   . Complication of anesthesia     per pt, slow to wake up past sedation.  . CVA (cerebral vascular accident) ([place]) 11/10/2019  . Depression   . Hyperplastic colon polyp   . Hypertension   . Neuropathy   . Osteoarthritis   . Osteoporosis   . Stomach upset    has a senstive stomach  . Throat clearing    has a cough at times  . Urinary incontinence   . Vitamin D deficiency    Past Surgical History:  Procedure Laterality Date  . COLONOSCOPY    . FACIAL COSMETIC SURGERY     over 15 years ago  . FINGER TENDON REPAIR     middle rt hand-  . HAMMER TOE SURGERY  12/27/2011   Procedure: HAMMER TOE CORRECTION;  Surgeon: Wylene Simmer, MD;  Location: [facility];  Service: Orthopedics;  Laterality: Left;  2-4th    Allergies  Allergen Reactions  . Chlorhexidine     Outpatient Encounter Medications as of 07/01/2020  Medication Sig  . acetaminophen (TYLENOL) 325 MG tablet Take 650 mg by mouth at bedtime. And q 6 hrs prn  . atorvastatin (LIPITOR) 40 MG tablet Take 1 tablet (40 mg total) by mouth daily.  . bisacodyl (BISACODYL) 5 MG EC tablet Take 5 mg by mouth daily as needed for mild constipation or moderate constipation.  . bisacodyl (DULCOLAX) 10 MG suppository If not relieved by MOM, give 10 mg Bisacodyl suppositiory rectally X 1 dose in 24 hours as needed (Do not use constipation standing orders for residents with renal failure/CFR less than 30.  Contact MD for orders) (Physician Order)  . clopidogrel (PLAVIX) 75 MG tablet Take 1 tablet (75 mg total) by mouth daily.  Marland Kitchen dicyclomine (BENTYL) 10 MG capsule Take 10 mg by mouth 3 (three) times daily as needed for spasms.  . magnesium hydroxide (MILK OF MAGNESIA) 400 MG/5ML suspension If no BM in 3 days, give 30 cc Milk of Magnesium p.o. x 1 dose in 24 hours as needed (Do not use standing constipation orders for residents with renal failure CFR less than 30. Contact MD for orders) (Physician Order)  . Melatonin 5 MG CAPS Take 5 mg by mouth at bedtime.  . metoprolol  tartrate (LOPRESSOR) 25 MG tablet Take 1 tablet (25 mg total) by mouth 2 (two) times daily.  . NON FORMULARY Diet: Mech Soft, no breads. Thin Liquids  . NON FORMULARY 4oz Medpass d/t Dx Malnutrition/poor intake.  . pantoprazole (PROTONIX) 20 MG tablet Take 20 mg by mouth daily.  . simethicone (MYLICON) 80 MG chewable tablet Chew 1 tablet (80 mg total) by mouth every 8 (eight) hours as needed for flatulence.  . Sodium Phosphates (RA SALINE ENEMA RE) If not relieved by Biscodyl suppository, give disposable Saline Enema rectally X 1 dose/24 hrs as needed (Do not use constipation standing orders for residents with renal failure/CFR less than 30. Contact MD for orders)(Physician Or  . [DISCONTINUED] mesalamine (CANASA) 1000 MG suppository Place 1,000 mg rectally 2 (two) times daily. X 4 weeks then qhs   No facility-administered encounter medications on file as of 07/01/2020.    Review of Systems  Constitutional: Negative for activity change, appetite change, chills, diaphoresis, fatigue, fever and unexpected weight change.  HENT: Positive for trouble swallowing. Negative for congestion.   Eyes: Negative for visual disturbance.  Respiratory: Negative for cough, shortness of breath and wheezing.   Cardiovascular: Negative for chest pain, palpitations and leg swelling.  Gastrointestinal: Negative for abdominal distention, abdominal pain, constipation and diarrhea.  Genitourinary: Negative for difficulty urinating, dysuria and frequency.  Musculoskeletal: Positive for gait problem. Negative for arthralgias, back pain, joint swelling and myalgias.  Neurological: Positive for weakness. Negative for dizziness, tremors, seizures, syncope, facial asymmetry, speech difficulty, light-headedness, numbness and headaches.  Psychiatric/Behavioral: Positive for confusion. Negative for agitation and behavioral problems.    Immunization History  Administered Date(s) Administered  . Fluad Quad(high Dose 65+)  04/27/2019  . Influenza, High Dose Seasonal PF 05/03/2017, 05/19/2018  . Influenza,inj,Quad PF,6+ Mos 04/29/2013, 05/18/2014, 05/02/2015, 04/17/2016  . PFIZER SARS-COV-2 Vaccination 09/21/2019  . Pneumococcal Conjugate-13 05/11/2013  . Pneumococcal Polysaccharide-23 05/02/2015   Pertinent  Health Maintenance Due  Topic Date Due  . MAMMOGRAM  01/09/2019  . INFLUENZA VACCINE  02/21/2020  . COLONOSCOPY  03/11/2024  . PNA vac Low Risk Adult  Completed  . DEXA SCAN  Discontinued   Fall Risk  12/07/2019 04/27/2019 08/05/2018 06/17/2018 12/25/2017  Falls in the past year? 0 1 0 0 No  Number falls in past yr: 0 0 - 0 -  Injury with Fall? 0 1 - 0 -   Functional Status Survey:    Vitals:   07/01/20 0951  Weight: 112 lb (50.8 kg)   Body mass index is 19.84 kg/m. Physical Exam Vitals and nursing note reviewed.  Constitutional:      General: She is not in acute distress.    Appearance: She is not diaphoretic.  HENT:     Head: Normocephalic and atraumatic.     Mouth/Throat:     Mouth: Mucous membranes are moist.  Pharynx: Oropharynx is clear.  Eyes:     Conjunctiva/sclera: Conjunctivae normal.     Pupils: Pupils are equal, round, and reactive to light.  Neck:     Vascular: No JVD.  Cardiovascular:     Rate and Rhythm: Normal rate and regular rhythm.     Heart sounds: No murmur heard.   Pulmonary:     Effort: Pulmonary effort is normal. No respiratory distress.     Breath sounds: Normal breath sounds. No wheezing.  Abdominal:     General: Abdomen is flat. Bowel sounds are normal.     Palpations: Abdomen is soft.  Musculoskeletal:     Right lower leg: No edema.     Left lower leg: No edema.  Skin:    General: Skin is warm and dry.  Neurological:     Mental Status: She is alert and oriented to person, place, and time.     Comments: LUE weakness improved 3/5 with increased AROM   Psychiatric:        Mood and Affect: Mood normal.     Labs reviewed: Recent Labs     11/18/19 0343 11/19/19 0240 11/21/19 0308 11/22/19 0502 11/23/19 0356 11/25/19 0000 12/17/19 0500 01/01/20 1255 01/18/20 0000  NA 139   < > 147* 144 141   < > 136* 141 140  K 4.4   < > 3.6 3.2* 3.8   < > 3.6 3.7 3.9  CL 108   < > 112* 112* 110   < > 102 105 103  CO2 12*   < > 27 25 23    < > 21 21 24*  GLUCOSE 123*   < > 110* 113* 102*  --   --   --   --   BUN 21   < > 29* 25* 17   < > 15 13 15   CREATININE 0.62   < > 0.65 0.60 0.56   < > 0.4* 0.4* 0.3*  CALCIUM 9.1   < > 9.2 8.8* 8.8*   < > 9.0 9.9 10.4  MG 2.0  --  2.2  --  1.8  --   --   --   --   PHOS  --   --  2.8  --   --   --   --   --   --    < > = values in this interval not displayed.   Recent Labs    11/20/19 0332 11/21/19 0308 11/22/19 0502 12/17/19 0500 12/24/19 0000 12/24/19 1255 01/18/20 0000  AST 30 26 22 22 23   --  63*  ALT 66* 51* 37 28 38*  --  105*  ALKPHOS 80 77 66  --   --   --  108  BILITOT 0.6 0.5 0.4  --   --   --   --   PROT 5.7* 5.7* 5.0*  --   --   --   --   ALBUMIN 2.3* 2.4* 2.1* 3.1*  --  3.2*  --    Recent Labs    11/21/19 0308 11/22/19 0502 11/23/19 0356 11/25/19 0000 12/17/19 0500 01/01/20 0000 01/18/20 0000  WBC 12.9* 13.9* 13.0*   < > 11.2 12.8 8.4  NEUTROABS 10.2* 11.1* 10.5*  --   --   --   --   HGB 14.0 12.4 13.0   < > 12.6 14.0 13.8  HCT 44.6 40.0 40.2   < > 38 43 42  MCV 98.0 98.8 96.6  --   --   --   --  PLT 442* 411* 431*   < > 319 318 318   < > = values in this interval not displayed.   Lab Results  Component Value Date   TSH 1.728 11/10/2019   Lab Results  Component Value Date   HGBA1C 5.1 11/11/2019   Lab Results  Component Value Date   CHOL 196 11/11/2019   HDL 65 11/11/2019   LDLCALC 115 (H) 11/11/2019   LDLDIRECT 116.0 04/27/2019   TRIG 78 11/11/2019   CHOLHDL 3.0 11/11/2019    Significant Diagnostic Results in last 30 days:  DG SWALLOW FUNC OP MEDICARE SPEECH PATH  Result Date: 06/14/2020 CLINICAL DATA:  Dysphagia. EXAM: MODIFIED BARIUM  SWALLOW TECHNIQUE: Different consistencies of barium were administered orally to the patient by the Speech Pathologist. Imaging of the pharynx was performed in the lateral projection. The radiologist was present in the fluoroscopy room for this study, providing personal supervision. FLUOROSCOPY TIME:  Fluoroscopy Time:  1 minutes and 54 seconds Radiation Exposure Index (if provided by the fluoroscopic device): 10 mGy Objective Swallowing Evaluation: Type of Study: MBS-Modified Barium Swallow Study  Patient Details Name: Lynn [name] MRN: [id number] Date of Birth: 1945-08-30 Today's Date: 06/14/2020 Time: SLP Start Time (ACUTE ONLY): 1144 -SLP Stop Time (ACUTE ONLY): 1201 SLP Time Calculation (min) (ACUTE ONLY): 17 min Past Medical History: Past Medical History: Diagnosis Date . Anxiety  . Aortic atherosclerosis ([place])  . Arthritis  . Complication of anesthesia   per pt, slow to wake up past sedation. . CVA (cerebral vascular accident) ([place]) 11/10/2019 . Depression  . Hyperplastic colon polyp  . Hypertension  . Neuropathy  . Osteoarthritis  . Osteoporosis  . Stomach upset   has a senstive stomach . Throat clearing   has a cough at times . Urinary incontinence  . Vitamin D deficiency  Past Surgical History: Past Surgical History: Procedure Laterality Date . COLONOSCOPY   . FACIAL COSMETIC SURGERY    over 15 years ago . FINGER TENDON REPAIR    middle rt hand- . HAMMER TOE SURGERY  12/27/2011  Procedure: HAMMER TOE CORRECTION;  Surgeon: Wylene Simmer, MD;  Location: [facility];  Service: Orthopedics;  Laterality: Left;  2-4th HPI: Pt is a 74 year old female with a history of CVA in April 2021 who presents with residual left sided weakness and post CVA dysphagia.  Pt is currently tolerating a mechanical soft diet and nectar thick liquids.  Last MBS in April was significant for moderate oral and pharyngeal deficits resulting in sensed aspiration of thin liquids.   Presenting for outpatient MBS today to  determine readiness to advance diet.   No data recorded Assessment / Plan / Recommendation CHL IP CLINICAL IMPRESSIONS 06/14/2020 Clinical Impression Pt presents with moderate oropharyngeal deficits including decreased oral control of boluses decreased contact between base of tongue and posterior pharynx which leads to premature spillage of thin liquids into the pharynx with subsequent sensed aspiration of thin liquids.  Pt had increased airway protection with use of a chin tuck and small controlled sips via straw but still had intermittent deep, trace penetration of thin liquids which pt did not sense.  With cues for a volitional throat clear and second swallow, pt was able to clear small amounts of penetrates from the cords.  A conservative means of advancing pt's diet would be to keep her on dys 3 textures and nectar thick liquids while allowing sips of water in between meals following oral care per the water protocol.  If pt can consistently use the following swallowing precautions: chin tuck, small bites/sips, and a volitional throat clear every 2-3 bites followed by a second swallow, a more aggressive approach to diet advancement could be to advance pt to thin liquids with full supervision and close monitoring for toleration.  Barriers to safe diet advancement that should be considered are pt's limited mobility (pt  reliant on a hoyer lift for transfers per most recent notes from [facility]) and observed cognitive challenges, including impulsivity with PO intake.  I will ultimately defer modifications to pt's treatment plan to her primary SLP.   SLP Visit Diagnosis Dysphagia, oropharyngeal phase (R13.12) Attention and concentration deficit following -- Frontal lobe and executive function deficit following -- Impact on safety and function Moderate aspiration risk   CHL IP TREATMENT RECOMMENDATION 06/14/2020 Treatment Recommendations Defer treatment plan to f/u with SLP   Prognosis 06/14/2020 Prognosis for Safe  Diet Advancement Good Barriers to Reach Goals Time post onset Barriers/Prognosis Comment -- CHL IP DIET RECOMMENDATION 06/14/2020 SLP Diet Recommendations Defer to primary SLP Liquid Administration via Cup;Straw Medication Administration Whole meds with puree Compensations Slow rate;Small sips/bites;Clear throat intermittently;Chin tuck;Effortful swallow Postural Changes Seated upright at 90 degrees   CHL IP OTHER RECOMMENDATIONS 06/14/2020 Recommended Consults -- Oral Care Recommendations Oral care BID Other Recommendations --   CHL IP FOLLOW UP RECOMMENDATIONS 06/14/2020 Follow up Recommendations Other (comment)   CHL IP FREQUENCY AND DURATION 11/12/2019 Speech Therapy Frequency (ACUTE ONLY) min 2x/week Treatment Duration 2 weeks      CHL IP ORAL PHASE 06/14/2020 Oral Phase Impaired Oral - Pudding Teaspoon -- Oral - Pudding Cup -- Oral - Honey Teaspoon -- Oral - Honey Cup -- Oral - Nectar Teaspoon -- Oral - Nectar Cup WFL Oral - Nectar Straw -- Oral - Thin Teaspoon -- Oral - Thin Cup Decreased bolus cohesion;Premature spillage;Incomplete tongue to palate contact Oral - Thin Straw Incomplete tongue to palate contact;Decreased bolus cohesion;Premature spillage Oral - Puree -- Oral - Mech Soft WFL Oral - Regular -- Oral - Multi-Consistency -- Oral - Pill -- Oral Phase - Comment --  CHL IP PHARYNGEAL PHASE 06/14/2020 Pharyngeal Phase Impaired Pharyngeal- Pudding Teaspoon -- Pharyngeal -- Pharyngeal- Pudding Cup -- Pharyngeal -- Pharyngeal- Honey Teaspoon -- Pharyngeal -- Pharyngeal- Honey Cup -- Pharyngeal -- Pharyngeal- Nectar Teaspoon -- Pharyngeal -- Pharyngeal- Nectar Cup WFL Pharyngeal -- Pharyngeal- Nectar Straw -- Pharyngeal -- Pharyngeal- Thin Teaspoon -- Pharyngeal -- Pharyngeal- Thin Cup Reduced tongue base retraction;Delayed swallow initiation-vallecula;Penetration/Aspiration during swallow Pharyngeal -- Pharyngeal- Thin Straw Reduced tongue base retraction;Delayed swallow initiation-vallecula Pharyngeal --  Pharyngeal- Puree -- Pharyngeal -- Pharyngeal- Mechanical Soft WFL Pharyngeal -- Pharyngeal- Regular -- Pharyngeal -- Pharyngeal- Multi-consistency -- Pharyngeal -- Pharyngeal- Pill -- Pharyngeal -- Pharyngeal Comment --  No flowsheet data found. Page, Selinda Orion 06/14/2020, 1:05 PM            COMPARISON:  11/12/2019 FINDINGS: Patient was given barium in multiple consistencies in the oropharynx and hypopharynx observed fluoroscopically while swallowing. Patient was noted have frank aspiration with thin liquids. IMPRESSION: Aspiration of thin liquids. Please refer to the Speech Pathologists report for complete details and recommendations. Electronically Signed   By: Misty Stanley M.D.   On: 06/14/2020 12:59    Assessment/Plan 1. Proctitis Resolved   2. Primary hypertension Controlled  Continue Lopressor 25 mg bid   3. 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 Urinary retention  Resolved, remains incontinent and recommended toileting schedule q2 WA  5. Pure hypercholesterolemia Continue Lipitor 40 mg qd  Check lipid panel   6. Insomnia secondary to anxiety Improved Continue melatonin 5 mg qhs     Family/ staff Communication: resident   Labs/tests ordered:  BMP Lipid panel

## 2020-08-24 ENCOUNTER — Ambulatory Visit: Payer: Medicare Other | Admitting: Internal Medicine

## 2020-08-26 ENCOUNTER — Encounter: Payer: Self-pay | Admitting: Adult Health

## 2020-08-26 ENCOUNTER — Non-Acute Institutional Stay (SKILLED_NURSING_FACILITY): Payer: Medicare Other | Admitting: Adult Health

## 2020-08-26 DIAGNOSIS — I69354 Hemiplegia and hemiparesis following cerebral infarction affecting left non-dominant side: Secondary | ICD-10-CM | POA: Diagnosis not present

## 2020-08-26 DIAGNOSIS — I10 Essential (primary) hypertension: Secondary | ICD-10-CM

## 2020-08-26 DIAGNOSIS — R32 Unspecified urinary incontinence: Secondary | ICD-10-CM

## 2020-08-26 DIAGNOSIS — E7800 Pure hypercholesterolemia, unspecified: Secondary | ICD-10-CM | POA: Diagnosis not present

## 2020-08-26 DIAGNOSIS — I69391 Dysphagia following cerebral infarction: Secondary | ICD-10-CM

## 2020-08-26 DIAGNOSIS — I7300 Raynaud's syndrome without gangrene: Secondary | ICD-10-CM

## 2020-08-26 NOTE — Progress Notes (Signed)
Location:  [facility] Room Number: 147-A Place of Service:  SNF [id number]) Provider:  Royal Hawthorn, NP   Patient Care Team: Gayland Curry, DO as PCP - General (Geriatric Medicine) Wylene Simmer, MD as Consulting Physician (Orthopedic Surgery) Cameron Sprang, MD as Consulting Physician (Neurology)  Extended Emergency Contact Information Primary Emergency Contact: [facility] Address: [address]          [place], [place] [address] Johnnette Litter of [place] Mobile Phone: [phone] Relation: Sister Interpreter needed? No Secondary Emergency Contact: Messinger,Jim  [place] [employer] Phone: [phone] Relation: Brother  Code Status:  DNR Goals of care: Advanced Directive information Advanced Directives 08/26/2020  Does Patient Have a Medical Advance Directive? Yes  Type of Advance Directive [place]  Does patient want to make changes to medical advance directive? No - Patient declined  Copy of [place] in Chart? Yes - validated most recent copy scanned in chart (See row information)  Would patient like information on creating a medical advance directive? -     Chief Complaint  Patient presents with  . Medical Management of Chronic Issues    Routine visit and discuss colonoscopy care gap or exclude     HPI:  Pt is a 75 y.o. female seen today for medical management of chronic diseases.    Ms. Esselman had a CVA in 10/2019 with infarcts of bilateral basal ganglia.  She was left with dysphagia and left-sided weakness.  She continues to work with therapy and has made gains.  She is able to ambulate with assistance short distances and transfer herself.  She is incontinent but at times able to get to the commode in time with help.  She is not having any pain or discomfort and has no acute complaints from my visit.  Tolerating a modified diet well with no coughing or choking.  She did have some  issues with proctitis but this is resolved.  She seems to be adjusting well to the skilled care unit and enjoys visits from her family and activities.  Past Medical History:  Diagnosis Date  . Anxiety   . Aortic atherosclerosis ([place])   . Arthritis   . Complication of anesthesia    per pt, slow to wake up past sedation.  . CVA (cerebral vascular accident) ([place]) 11/10/2019  . Depression   . Hyperplastic colon polyp   . Hypertension   . Neuropathy   . Osteoarthritis   . Osteoporosis   . Stomach upset    has a senstive stomach  . Throat clearing    has a cough at times  . Urinary incontinence   . Vitamin D deficiency    Past Surgical History:  Procedure Laterality Date  . COLONOSCOPY    . FACIAL COSMETIC SURGERY     over 15 years ago  . FINGER TENDON REPAIR     middle rt hand-  . HAMMER TOE SURGERY  12/27/2011   Procedure: HAMMER TOE CORRECTION;  Surgeon: Wylene Simmer, MD;  Location: [facility];  Service: Orthopedics;  Laterality: Left;  2-4th    Allergies  Allergen Reactions  . Chlorhexidine     Outpatient Encounter Medications as of 08/26/2020  Medication Sig  . acetaminophen (TYLENOL) 325 MG tablet Take 650 mg by mouth at bedtime. And q 6 hrs prn  . atorvastatin (LIPITOR) 40 MG tablet Take 1 tablet (40 mg total) by mouth daily.  . bisacodyl (BISACODYL) 5 MG EC  tablet Take 5 mg by mouth daily as needed for mild constipation or moderate constipation.  . bisacodyl (DULCOLAX) 10 MG suppository If not relieved by MOM, give 10 mg Bisacodyl suppositiory rectally X 1 dose in 24 hours as needed (Do not use constipation standing orders for residents with renal failure/CFR less than 30. Contact MD for orders) (Physician Order)  . clopidogrel (PLAVIX) 75 MG tablet Take 1 tablet (75 mg total) by mouth daily.  Marland Kitchen dicyclomine (BENTYL) 10 MG capsule Take 10 mg by mouth 3 (three) times daily as needed for spasms.  . magnesium hydroxide (MILK OF MAGNESIA) 400 MG/5ML suspension If  no BM in 3 days, give 30 cc Milk of Magnesium p.o. x 1 dose in 24 hours as needed (Do not use standing constipation orders for residents with renal failure CFR less than 30. Contact MD for orders) (Physician Order)  . Melatonin 5 MG CAPS Take 5 mg by mouth at bedtime.  . metoprolol tartrate (LOPRESSOR) 25 MG tablet Take 1 tablet (25 mg total) by mouth 2 (two) times daily.  . NON FORMULARY Diet: Regular, Nectar Thick, Chopped/Dysphagia 3   Special Instructions: Aspiration precautions. May have bacon per SLP.  Marland Kitchen pantoprazole (PROTONIX) 20 MG tablet Take 20 mg by mouth daily.  . simethicone (MYLICON) 80 MG chewable tablet Chew 1 tablet (80 mg total) by mouth every 8 (eight) hours as needed for flatulence.  . Sodium Phosphates (RA SALINE ENEMA RE) If not relieved by Biscodyl suppository, give disposable Saline Enema rectally X 1 dose/24 hrs as needed (Do not use constipation standing orders for residents with renal failure/CFR less than 30. Contact MD for orders)(Physician Or  . UNABLE TO FIND Med Name: 1-2-3 Cream; 60 grams; amt: 1 application; topical  Special Instructions: Apply 1-2-3 cream twice daily to buttocks as needed for skin  flare up. Continue to use Desitin cream as a preventative. U  . [DISCONTINUED] NON FORMULARY 4oz Medpass d/t Dx Malnutrition/poor intake.   No facility-administered encounter medications on file as of 08/26/2020.    Review of Systems  Constitutional: Negative for activity change, appetite change, chills, diaphoresis, fatigue, fever and unexpected weight change.  HENT: Negative for congestion.   Respiratory: Negative for cough, shortness of breath and wheezing.   Cardiovascular: Negative for chest pain, palpitations and leg swelling.  Gastrointestinal: Negative for abdominal distention, abdominal pain, constipation and diarrhea.  Genitourinary: Negative for difficulty urinating and dysuria.  Musculoskeletal: Positive for gait problem. Negative for arthralgias, back  pain, joint swelling and myalgias.  Neurological: Positive for weakness. Negative for dizziness, tremors, seizures, syncope, facial asymmetry, speech difficulty, light-headedness, numbness and headaches.  Psychiatric/Behavioral: Positive for confusion. Negative for agitation and behavioral problems.    Immunization History  Administered Date(s) Administered  . Fluad Quad(high Dose 65+) 04/27/2019  . Influenza, High Dose Seasonal PF 05/03/2017, 05/19/2018  . Influenza,inj,Quad PF,6+ Mos 04/29/2013, 05/18/2014, 05/02/2015, 04/17/2016  . Influenza-Unspecified 05/13/2020  . PFIZER(Purple Top)SARS-COV-2 Vaccination 08/27/2019, 09/21/2019, 06/13/2020  . Pneumococcal Conjugate-13 05/11/2013  . Pneumococcal Polysaccharide-23 05/02/2015   Pertinent  Health Maintenance Due  Topic Date Due  . COLONOSCOPY (Pts 45-10yrs Insurance coverage will need to be confirmed)  03/11/2024  . INFLUENZA VACCINE  Completed  . PNA vac Low Risk Adult  Completed  . DEXA SCAN  Discontinued   Fall Risk  12/07/2019 04/27/2019 08/05/2018 06/17/2018 12/25/2017  Falls in the past year? 0 1 0 0 No  Number falls in past yr: 0 0 - 0 -  Injury with Fall?  0 1 - 0 -   Functional Status Survey:    Vitals:   08/26/20 1049  BP: 122/73  Pulse: (!) 59  Resp: (!) 22  Temp: (!) 96.8 F (36 C)  SpO2: 94%  Weight: 112 lb (50.8 kg)  Height: 5\' 3"  (1.6 m)   Body mass index is 19.84 kg/m. Physical Exam Vitals and nursing note reviewed.  Constitutional:      General: She is not in acute distress.    Appearance: She is not diaphoretic.  HENT:     Head: Normocephalic and atraumatic.     Mouth/Throat:     Mouth: Mucous membranes are moist.     Comments: dysphonia Neck:     Vascular: No JVD.  Cardiovascular:     Rate and Rhythm: Normal rate and regular rhythm.     Heart sounds: No murmur heard.   Pulmonary:     Effort: Pulmonary effort is normal. No respiratory distress.     Breath sounds: Normal breath sounds. No  wheezing.  Abdominal:     General: Bowel sounds are normal. There is no distension.     Palpations: Abdomen is soft.     Tenderness: There is no abdominal tenderness.  Musculoskeletal:     Right lower leg: No edema.     Left lower leg: No edema.  Skin:    General: Skin is warm and dry.  Neurological:     Mental Status: She is alert and oriented to person, place, and time.     Comments: Left sided weakness has improved. Grip 3/5 LUE RUE 5/5  Psychiatric:        Mood and Affect: Mood normal.     Labs reviewed: Recent Labs    11/18/19 0343 11/19/19 0240 11/21/19 0308 11/22/19 0502 11/23/19 0356 11/25/19 0000 01/01/20 1255 01/18/20 0000 07/04/20 0000  NA 139   < > 147* 144 141   < > 141 140 140  K 4.4   < > 3.6 3.2* 3.8   < > 3.7 3.9 4.0  CL 108   < > 112* 112* 110   < > 105 103 109*  CO2 12*   < > 27 25 23    < > 21 24* 23*  GLUCOSE 123*   < > 110* 113* 102*  --   --   --   --   BUN 21   < > 29* 25* 17   < > 13 15 16   CREATININE 0.62   < > 0.65 0.60 0.56   < > 0.4* 0.3* 0.4*  CALCIUM 9.1   < > 9.2 8.8* 8.8*   < > 9.9 10.4 9.6  MG 2.0  --  2.2  --  1.8  --   --   --   --   PHOS  --   --  2.8  --   --   --   --   --   --    < > = values in this interval not displayed.   Recent Labs    11/20/19 0332 11/21/19 0308 11/22/19 0502 12/17/19 0500 12/24/19 0000 12/24/19 1255 01/18/20 0000  AST 30 26 22 22 23   --  63*  ALT 66* 51* 37 28 38*  --  105*  ALKPHOS 80 77 66  --   --   --  108  BILITOT 0.6 0.5 0.4  --   --   --   --   PROT 5.7* 5.7* 5.0*  --   --   --   --  ALBUMIN 2.3* 2.4* 2.1* 3.1*  --  3.2*  --    Recent Labs    11/21/19 0308 11/22/19 0502 11/23/19 0356 11/25/19 0000 12/17/19 0500 01/01/20 0000 01/18/20 0000  WBC 12.9* 13.9* 13.0*   < > 11.2 12.8 8.4  NEUTROABS 10.2* 11.1* 10.5*  --   --   --   --   HGB 14.0 12.4 13.0   < > 12.6 14.0 13.8  HCT 44.6 40.0 40.2   < > 38 43 42  MCV 98.0 98.8 96.6  --   --   --   --   PLT 442* 411* 431*   < > 319 318  318   < > = values in this interval not displayed.   Lab Results  Component Value Date   TSH 1.728 11/10/2019   Lab Results  Component Value Date   HGBA1C 5.1 11/11/2019   Lab Results  Component Value Date   CHOL 112 07/04/2020   HDL 56 07/04/2020   LDLCALC 42 07/04/2020   LDLDIRECT 116.0 04/27/2019   TRIG 69 07/04/2020   CHOLHDL 3.0 11/11/2019    Significant Diagnostic Results in last 30 days:  No results found.  Assessment/Plan  1. 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 Still appropriate for skilled care.   2. Dysphagia following cerebrovascular accident (CVA) Doing well Continue modified diet and asp prec.   3. Primary hypertension Controlled Continue lopressor 25 mg bid   4. Pure hypercholesterolemia Lab Results  Component Value Date   LDLCALC 42 07/04/2020   Continue lipitor 40 mg qd At goal <70  5. Incontinence in female Toilet q 2 WA  6. Raynaud's phenomenon without gangrene Not currently an issue.   Excluded from colonoscopy due to prior CVA and pt wishes.   Family/ staff Communication: resident   Labs/tests ordered:  NA

## 2020-09-12 ENCOUNTER — Encounter: Payer: Self-pay | Admitting: *Deleted

## 2020-09-12 ENCOUNTER — Encounter: Payer: Self-pay | Admitting: Internal Medicine

## 2020-09-26 ENCOUNTER — Encounter: Payer: Self-pay | Admitting: Neurology

## 2020-09-26 ENCOUNTER — Ambulatory Visit: Payer: Medicare Other | Admitting: Internal Medicine

## 2020-09-26 ENCOUNTER — Ambulatory Visit: Payer: Medicare Other | Admitting: Neurology

## 2020-09-26 ENCOUNTER — Other Ambulatory Visit: Payer: Self-pay

## 2020-09-26 VITALS — BP 120/69 | HR 61 | Ht 62.0 in | Wt 109.0 lb

## 2020-09-26 DIAGNOSIS — I69398 Other sequelae of cerebral infarction: Secondary | ICD-10-CM

## 2020-09-26 DIAGNOSIS — F0631 Mood disorder due to known physiological condition with depressive features: Secondary | ICD-10-CM | POA: Diagnosis not present

## 2020-09-26 DIAGNOSIS — I6359 Cerebral infarction due to unspecified occlusion or stenosis of other cerebral artery: Secondary | ICD-10-CM

## 2020-09-26 MED ORDER — ESCITALOPRAM OXALATE 10 MG PO TABS
10.0000 mg | ORAL_TABLET | Freq: Every day | ORAL | 1 refills | Status: DC
Start: 2020-09-26 — End: 2021-01-30

## 2020-09-26 NOTE — Patient Instructions (Signed)
You look amazing. Continue with physical therapy and occupational therapy.   Start Lexapro 10mg  daily for post-stroke depression and anxiety  Continue all your other medications  Follow-up in 3-4 months, call for any changes

## 2020-09-26 NOTE — Progress Notes (Addendum)
NEUROLOGY FOLLOW UP OFFICE NOTE  Lynn [name] [id number] [birth year]  HISTORY OF PRESENT ILLNESS: I had the pleasure of seeing Lynn [name] in follow-up in the neurology clinic on 09/26/2020.She is accompanied by her sister [name] who helps supplement the history today.  The patient was last seen 10 months ago, a month after she had bilateral basal ganglia strokes in April 2021. At that time she was in a stretcher, drowsy, with minimal movements. She has made significant gains over the past few months. She is in a wheelchair today, verbal with mild dysarthria, able to ambulate with a walker. She feels okay, she denies any pain. Initially physical therapy was stopped since she was not making gains, however her occupational therapist continued to work with her 2-3 times a week and she made continued progression that she is back with physical therapy to work on ambulation with her walker. She still cannot eat and drink normally and has completed speech therapy. She lives at [facility] now. Family wishes they can help her feel more motivated to do things, she usually wants to get in bed in the afternoon. Sleep is okay, she wakes up early but goes to bed very early as well. [name] thinks she has a lot of anxiety. She denies any significant headaches, dizziness, focal paresthesias. Her legs feel cold, she uses a heating pad.    HPI 05/09/2017: This is a pleasant 75 yo RH woman with a history of hypertension, who presented for evaluation of gait unsteadiness. She had been seeing Neurosurgery for back pain. She reported symptoms started a couple of years ago, but worsened in the past 6-8 months. She had fractured her left foot and was doing PT, noting difficulties with heel-toe walking, worse when she closes her eyes. She has come very close to falling, she was drying her hair bending forward and pitched forward. She has numbness and tingling in both hands and feet up to the mid-calf region. She has  occasional sharp pains in both feet. Over the past couple of years, she has noticed clumsiness in both hands, worse the past year. If she does much lifting, she starts having back pain. She denies any neck pain except when bending head forward too much. She has some stress incontinence, no bowel dysfunction. She has occasional headaches associated with eye issues. She has a little blurred vision with dry eye. Over the past 1.5 years, she has noticed a change in her speech, her voice has thickened and she is more hesitant when talking, worse this past year. She has also noticed some anxiety worsens it as well. She occasionally chokes on her food. She denies any family history of similar symptoms. She had an MRI brain without contrast at [facility] last 02/28/17, images unavailable for review, there were no acute changes. There was mild FLAIR white matter changes, normal brain volume.   Update 12/07/2019: She presents today in a stretcher, with her sister present to provide additional information. Records were reviewed. Unfortunately she had bilateral strokes last month. She was brought to the ER on 4/20 for "slow thinking" and expressive aphasia. She was noted to have left-sided weakness and expressive aphasia in the ER. Family had noticed she had slowed down in the past few years since moving in to her mother's home, in the past 6 months she had not cared for her appearance and was walking with a cane due to gait unsteadiness. I personally reviewed MRI brain without contrast which showed patchy acute infarcts involving  the basal ganglia/corona radiata bilaterally, left greater than right. There was mildly age advanced chronic microvascular disease. CTA head and neck no significant stenosis. LDL elevated, HbA1c 5.1. During her hospitalization, she was noted to have mild dysarthria and psychomotor slowing, left UE 0/5, LLE 3/5 proximally, 4/5 distally, 4/5 right UE and LE with right foot drop. Etiology of  stroke unclear, possibly small vessel disease, however given bilateral involvement, 30-day cardiac monitor was recommended. She was discharged on DAPT for 3 months, then Plavix alone. She had an episode of decreased responsiveness felt due to toxic/metabolic/infectious cause, WBC was 20.6. Outpatient cognitive testing was recommended, however unable to perform today due to her mental status. Patient is drowsy, arousable to say "hi," recognizing the examiner from our prior visits, able to follow simple commands on the right arm. Flaccid left, unable to move both LE much.    PAST MEDICAL HISTORY: Past Medical History:  Diagnosis Date  . Anxiety   . Aortic atherosclerosis ([place])   . Arthritis   . Complication of anesthesia    per pt, slow to wake up past sedation.  . CVA (cerebral vascular accident) ([place]) 11/10/2019  . Depression   . Diverticulosis   . Hiatal hernia   . Hyperplastic colon polyp   . Hypertension   . Neuropathy   . Osteoarthritis   . Osteoporosis   . Renal mass   . Stomach upset    has a senstive stomach  . Throat clearing    has a cough at times  . Urinary incontinence   . Vitamin D deficiency     MEDICATIONS: Current Outpatient Medications on File Prior to Visit  Medication Sig Dispense Refill  . atorvastatin (LIPITOR) 40 MG tablet Take 1 tablet (40 mg total) by mouth daily. 30 tablet 0  . bisacodyl (BISACODYL) 5 MG EC tablet Take 5 mg by mouth daily as needed for mild constipation or moderate constipation.    . bisacodyl (DULCOLAX) 10 MG suppository If not relieved by MOM, give 10 mg Bisacodyl suppositiory rectally X 1 dose in 24 hours as needed (Do not use constipation standing orders for residents with renal failure/CFR less than 30. Contact MD for orders) (Physician Order)    . clopidogrel (PLAVIX) 75 MG tablet Take 1 tablet (75 mg total) by mouth daily. 30 tablet 0  . dicyclomine (BENTYL) 10 MG capsule Take 10 mg by mouth 3 (three) times daily as needed for  spasms.    . magnesium hydroxide (MILK OF MAGNESIA) 400 MG/5ML suspension If no BM in 3 days, give 30 cc Milk of Magnesium p.o. x 1 dose in 24 hours as needed (Do not use standing constipation orders for residents with renal failure CFR less than 30. Contact MD for orders) (Physician Order)    . Melatonin 5 MG CAPS Take 5 mg by mouth at bedtime.    . metoprolol tartrate (LOPRESSOR) 25 MG tablet Take 1 tablet (25 mg total) by mouth 2 (two) times daily. 60 tablet 0  . pantoprazole (PROTONIX) 20 MG tablet Take 20 mg by mouth daily.    . simethicone (MYLICON) 80 MG chewable tablet Chew 1 tablet (80 mg total) by mouth every 8 (eight) hours as needed for flatulence. 120 tablet 0  . Sodium Phosphates (RA SALINE ENEMA RE) If not relieved by Biscodyl suppository, give disposable Saline Enema rectally X 1 dose/24 hrs as needed (Do not use constipation standing orders for residents with renal failure/CFR less than 30. Contact MD for orders)(Physician Or    .  UNABLE TO FIND Med Name: 1-2-3 Cream; 60 grams; amt: 1 application; topical  Special Instructions: Apply 1-2-3 cream twice daily to buttocks as needed for skin  flare up. Continue to use Desitin cream as a preventative. U    . acetaminophen (TYLENOL) 325 MG tablet Take 650 mg by mouth at bedtime. And q 6 hrs prn    . NON FORMULARY Diet: Regular, Nectar Thick, Chopped/Dysphagia 3   Special Instructions: Aspiration precautions. May have bacon per SLP.     No current facility-administered medications on file prior to visit.    ALLERGIES: Allergies  Allergen Reactions  . Chlorhexidine     FAMILY HISTORY: Family History  Problem Relation Age of Onset  . Arthritis Mother   . Colon cancer Father   . COPD Father 8       died at 74  . Throat cancer Brother   . Breast cancer Other   . Esophageal cancer Neg Hx   . Rectal cancer Neg Hx   . Stomach cancer Neg Hx   . Stroke Neg Hx     SOCIAL HISTORY: Social History   Socioeconomic History  .  Marital status: Divorced    Spouse name: Not on file  . Number of children: 1  . Years of education: 48  . Highest education level: Not on file  Occupational History  . Occupation: retired  Tobacco Use  . Smoking status: Former Smoker    Years: 10.00    Quit date: 12/21/1975    Years since quitting: 44.7  . Smokeless tobacco: Never Used  Vaping Use  . Vaping Use: Never used  Substance and Sexual Activity  . Alcohol use: Yes    Comment: rarely  . Drug use: No  . Sexual activity: Not Currently  Other Topics Concern  . Not on file  Social History Narrative   Divorced. Children: 1 son. Patient supports her mother- mother lives with her- 2 story home.       Retired from [employer]  Education: college.       Hobbies: riding horses in the past, family time, enjoys some gardening   Social Determinants of Radio broadcast assistant Strain: Not on file  Food Insecurity: Not on file  Transportation Needs: Not on file  Physical Activity: Not on file  Stress: Not on file  Social Connections: Not on file  Intimate Partner Violence: Not on file     PHYSICAL EXAM: Vitals:   09/26/20 1613  BP: 120/69  Pulse: 61  SpO2: 97%   General: No acute distress Head:  Normocephalic/atraumatic Skin/Extremities: No rash, no edema Neurological Exam: alert and awake. Mild dysarthria. Fund of knowledge is appropriate. Attention and concentration are normal.   Cranial nerves: Pupils equal, round. Extraocular movements intact with no nystagmus. Visual fields full.  No facial asymmetry.  Motor: Tone increased on left UE with contracture of fingers on left hand. 5/5 on right UE and LE, 4/5 on left UE and LE, with orbiting around left hand and decreased finger movements on left. Finger to nose testing intact.  Gait slow and cautious with walker, no ataxia   IMPRESSION: This is a pleasant 75 yo RH woman with a history of hypertension, initially seen for gait unsteadiness felt due to  neuropathy, who had bilateral basal ganglia strokes, L>R in April 2021. She has made significant gains over the past few months, going from bedbound to ambulating with a walker. There is mild left hemiparesis and mild dysarthria.  Etiology of stroke possibly small vessel disease, cardiac monitor did not show any atrial fibrillation. Continue Plavix, control of vascular risk factors. We discussed post-stroke depression and anxiety, which family has noticed as well. She is agreeable to starting Lexapro 10mg  daily, side effects discussed. Continue working with PT and OT. Follow-up in 4-5 months, call for any changes.   Thank you for allowing me to participate in her care.  Please do not hesitate to call for any questions or concerns.   Ellouise Newer, M.D.   CC: Dr. Mariea Clonts

## 2020-09-30 ENCOUNTER — Ambulatory Visit: Payer: Medicare Other | Admitting: Internal Medicine

## 2020-10-06 ENCOUNTER — Encounter: Payer: Self-pay | Admitting: Adult Health

## 2020-10-06 ENCOUNTER — Non-Acute Institutional Stay (SKILLED_NURSING_FACILITY): Payer: Medicare Other | Admitting: Adult Health

## 2020-10-06 DIAGNOSIS — R2689 Other abnormalities of gait and mobility: Secondary | ICD-10-CM

## 2020-10-06 DIAGNOSIS — I10 Essential (primary) hypertension: Secondary | ICD-10-CM | POA: Diagnosis not present

## 2020-10-06 DIAGNOSIS — F329 Major depressive disorder, single episode, unspecified: Secondary | ICD-10-CM

## 2020-10-06 DIAGNOSIS — E7800 Pure hypercholesterolemia, unspecified: Secondary | ICD-10-CM

## 2020-10-06 DIAGNOSIS — I69391 Dysphagia following cerebral infarction: Secondary | ICD-10-CM | POA: Diagnosis not present

## 2020-10-06 DIAGNOSIS — I69354 Hemiplegia and hemiparesis following cerebral infarction affecting left non-dominant side: Secondary | ICD-10-CM | POA: Diagnosis not present

## 2020-10-06 NOTE — Progress Notes (Signed)
Location:  Occupational psychologist of Service:  SNF (31) Provider:   Cindi Carbon, ANP [place] [phone]   Gayland Curry, DO  Patient Care Team: Gayland Curry, DO as PCP - General (Geriatric Medicine) Wylene Simmer, MD as Consulting Physician (Orthopedic Surgery) Cameron Sprang, MD as Consulting Physician (Neurology)  Extended Emergency Contact Information Primary Emergency Contact: [facility] Address: [address]          [place], [place] [address] Johnnette Litter of [place] Mobile Phone: [phone] Relation: Sister Interpreter needed? No Secondary Emergency Contact: Trenkamp,Jim  [place] [employer] Phone: [phone] Relation: Brother  Code Status:  DNR Goals of care: Advanced Directive information Advanced Directives 09/26/2020  Does Patient Have a Medical Advance Directive? Yes  Type of Advance Directive [place]  Does patient want to make changes to medical advance directive? -  Copy of [place] in Chart? -  Would patient like information on creating a medical advance directive? -     Chief Complaint  Patient presents with  . Medical Management of Chronic Issues    HPI:  Pt is a 75 y.o. female seen today for medical management of chronic diseases.    She reports that she tripped and fell and hit both of her knees but did not sustain an injury and is not in any pain.   She has a prior hx of CVA with left sided weakness and dysphagia. She is currently on a D3 diet with NTL. She is working with ST to see if she can advance her diet.   On 3/7 she was seen by neurology and placed on lexapro for depression/anxiety related to her CVA.  No s/e noted thus far.   Past Medical History:  Diagnosis Date  . Anxiety   . Aortic atherosclerosis ([place])   . Arthritis   . Complication of anesthesia    per pt, slow to wake up past sedation.  . CVA (cerebral vascular accident) ([place])  11/10/2019  . Depression   . Diverticulosis   . Hiatal hernia   . Hyperplastic colon polyp   . Hypertension   . Neuropathy   . Osteoarthritis   . Osteoporosis   . Renal mass   . Stomach upset    has a senstive stomach  . Throat clearing    has a cough at times  . Urinary incontinence   . Vitamin D deficiency    Past Surgical History:  Procedure Laterality Date  . COLONOSCOPY    . FACIAL COSMETIC SURGERY     over 15 years ago  . FINGER TENDON REPAIR     middle rt hand-  . HAMMER TOE SURGERY  12/27/2011   Procedure: HAMMER TOE CORRECTION;  Surgeon: Wylene Simmer, MD;  Location: [facility];  Service: Orthopedics;  Laterality: Left;  2-4th    Allergies  Allergen Reactions  . Chlorhexidine     Outpatient Encounter Medications as of 10/06/2020  Medication Sig  . acetaminophen (TYLENOL) 325 MG tablet Take 650 mg by mouth at bedtime. And q 6 hrs prn  . atorvastatin (LIPITOR) 40 MG tablet Take 1 tablet (40 mg total) by mouth daily.  . bisacodyl (BISACODYL) 5 MG EC tablet Take 5 mg by mouth daily as needed for mild constipation or moderate constipation.  . bisacodyl (DULCOLAX) 10 MG suppository If not relieved by MOM, give 10 mg Bisacodyl suppositiory rectally X 1 dose in 24 hours as  needed (Do not use constipation standing orders for residents with renal failure/CFR less than 30. Contact MD for orders) (Physician Order)  . clopidogrel (PLAVIX) 75 MG tablet Take 1 tablet (75 mg total) by mouth daily.  Marland Kitchen dicyclomine (BENTYL) 10 MG capsule Take 10 mg by mouth 3 (three) times daily as needed for spasms.  Marland Kitchen escitalopram (LEXAPRO) 10 MG tablet Take 1 tablet (10 mg total) by mouth daily.  . magnesium hydroxide (MILK OF MAGNESIA) 400 MG/5ML suspension If no BM in 3 days, give 30 cc Milk of Magnesium p.o. x 1 dose in 24 hours as needed (Do not use standing constipation orders for residents with renal failure CFR less than 30. Contact MD for orders) (Physician Order)  . Melatonin 5  MG CAPS Take 5 mg by mouth at bedtime.  . metoprolol tartrate (LOPRESSOR) 25 MG tablet Take 1 tablet (25 mg total) by mouth 2 (two) times daily.  . NON FORMULARY Diet: Regular, Nectar Thick, Chopped/Dysphagia 3   Special Instructions: Aspiration precautions. May have bacon per SLP.  Marland Kitchen pantoprazole (PROTONIX) 20 MG tablet Take 20 mg by mouth daily.  . simethicone (MYLICON) 80 MG chewable tablet Chew 1 tablet (80 mg total) by mouth every 8 (eight) hours as needed for flatulence.  . Sodium Phosphates (RA SALINE ENEMA RE) If not relieved by Biscodyl suppository, give disposable Saline Enema rectally X 1 dose/24 hrs as needed (Do not use constipation standing orders for residents with renal failure/CFR less than 30. Contact MD for orders)(Physician Or  . UNABLE TO FIND Med Name: 1-2-3 Cream; 60 grams; amt: 1 application; topical  Special Instructions: Apply 1-2-3 cream twice daily to buttocks as needed for skin  flare up. Continue to use Desitin cream as a preventative. U   No facility-administered encounter medications on file as of 10/06/2020.    Review of Systems  Constitutional: Negative for activity change, appetite change, chills, diaphoresis, fatigue, fever and unexpected weight change.  HENT: Negative for congestion and trouble swallowing.   Respiratory: Negative for cough, shortness of breath and wheezing.   Cardiovascular: Negative for chest pain, palpitations and leg swelling.  Gastrointestinal: Negative for abdominal distention, abdominal pain, constipation and diarrhea.  Genitourinary: Negative for difficulty urinating and dysuria.  Musculoskeletal: Positive for gait problem. Negative for arthralgias, back pain, joint swelling and myalgias.  Neurological: Positive for weakness. Negative for dizziness, tremors, seizures, syncope, facial asymmetry, speech difficulty, light-headedness, numbness and headaches.  Psychiatric/Behavioral: Negative for agitation, behavioral problems and  confusion.    Immunization History  Administered Date(s) Administered  . Fluad Quad(high Dose 65+) 04/27/2019  . Influenza, High Dose Seasonal PF 05/03/2017, 05/19/2018  . Influenza,inj,Quad PF,6+ Mos 04/29/2013, 05/18/2014, 05/02/2015, 04/17/2016  . Influenza-Unspecified 05/13/2020  . PFIZER(Purple Top)SARS-COV-2 Vaccination 08/27/2019, 09/21/2019, 06/13/2020  . Pneumococcal Conjugate-13 05/11/2013  . Pneumococcal Polysaccharide-23 05/02/2015   Pertinent  Health Maintenance Due  Topic Date Due  . INFLUENZA VACCINE  Completed  . PNA vac Low Risk Adult  Completed  . DEXA SCAN  Discontinued  . COLONOSCOPY (Pts 45-61yrs Insurance coverage will need to be confirmed)  Discontinued   Fall Risk  09/26/2020 12/07/2019 04/27/2019 08/05/2018 06/17/2018  Falls in the past year? 0 0 1 0 0  Number falls in past yr: 0 0 0 - 0  Injury with Fall? 0 0 1 - 0   Functional Status Survey:    Vitals:   10/06/20 1406  Weight: 111 lb 6.4 oz (50.5 kg)   Body mass index is 20.38 kg/m.  Physical Exam Vitals and nursing note reviewed.  Constitutional:      Appearance: Normal appearance.  Musculoskeletal:     Right lower leg: No edema.     Left lower leg: No edema.  Neurological:     Mental Status: She is alert. Mental status is at baseline.     Labs reviewed: Recent Labs    11/18/19 0343 11/19/19 0240 11/21/19 0308 11/22/19 0502 11/23/19 0356 11/25/19 0000 01/01/20 1255 01/18/20 0000 07/04/20 0000  NA 139   < > 147* 144 141   < > 141 140 140  K 4.4   < > 3.6 3.2* 3.8   < > 3.7 3.9 4.0  CL 108   < > 112* 112* 110   < > 105 103 109*  CO2 12*   < > 27 25 23    < > 21 24* 23*  GLUCOSE 123*   < > 110* 113* 102*  --   --   --   --   BUN 21   < > 29* 25* 17   < > 13 15 16   CREATININE 0.62   < > 0.65 0.60 0.56   < > 0.4* 0.3* 0.4*  CALCIUM 9.1   < > 9.2 8.8* 8.8*   < > 9.9 10.4 9.6  MG 2.0  --  2.2  --  1.8  --   --   --   --   PHOS  --   --  2.8  --   --   --   --   --   --    < > = values  in this interval not displayed.   Recent Labs    11/20/19 0332 11/21/19 0308 11/22/19 0502 12/17/19 0500 12/24/19 0000 12/24/19 1255 01/18/20 0000  AST 30 26 22 22 23   --  63*  ALT 66* 51* 37 28 38*  --  105*  ALKPHOS 80 77 66  --   --   --  108  BILITOT 0.6 0.5 0.4  --   --   --   --   PROT 5.7* 5.7* 5.0*  --   --   --   --   ALBUMIN 2.3* 2.4* 2.1* 3.1*  --  3.2*  --    Recent Labs    11/21/19 0308 11/22/19 0502 11/23/19 0356 11/25/19 0000 12/17/19 0500 01/01/20 0000 01/18/20 0000  WBC 12.9* 13.9* 13.0*   < > 11.2 12.8 8.4  NEUTROABS 10.2* 11.1* 10.5*  --   --   --   --   HGB 14.0 12.4 13.0   < > 12.6 14.0 13.8  HCT 44.6 40.0 40.2   < > 38 43 42  MCV 98.0 98.8 96.6  --   --   --   --   PLT 442* 411* 431*   < > 319 318 318   < > = values in this interval not displayed.   Lab Results  Component Value Date   TSH 1.728 11/10/2019   Lab Results  Component Value Date   HGBA1C 5.1 11/11/2019   Lab Results  Component Value Date   CHOL 112 07/04/2020   HDL 56 07/04/2020   LDLCALC 42 07/04/2020   LDLDIRECT 116.0 04/27/2019   TRIG 69 07/04/2020   CHOLHDL 3.0 11/11/2019    Significant Diagnostic Results in last 30 days:  No results found.  Assessment/Plan 1. Dysphagia following cerebrovascular accident (CVA) Working with ST to advanced diet  2. 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 Primary hypertension Controlled without meds  4. Reactive depression Currently on Lexapro will need 4-6 weeks to see benefit.   5. Pure hypercholesterolemia Lab Results  Component Value Date   LDLCALC 42 07/04/2020   Continue lipitor   6. Abnormality of gait due to impairment of balance Continues to work with therapy and making gains     Labs/tests ordered: NA

## 2020-11-08 ENCOUNTER — Encounter: Payer: Self-pay | Admitting: Orthopedic Surgery

## 2020-11-08 ENCOUNTER — Non-Acute Institutional Stay (SKILLED_NURSING_FACILITY): Payer: Medicare Other | Admitting: Orthopedic Surgery

## 2020-11-08 DIAGNOSIS — E7800 Pure hypercholesterolemia, unspecified: Secondary | ICD-10-CM

## 2020-11-08 DIAGNOSIS — I10 Essential (primary) hypertension: Secondary | ICD-10-CM | POA: Diagnosis not present

## 2020-11-08 DIAGNOSIS — I69391 Dysphagia following cerebral infarction: Secondary | ICD-10-CM | POA: Diagnosis not present

## 2020-11-08 DIAGNOSIS — F329 Major depressive disorder, single episode, unspecified: Secondary | ICD-10-CM | POA: Diagnosis not present

## 2020-11-08 DIAGNOSIS — K0889 Other specified disorders of teeth and supporting structures: Secondary | ICD-10-CM

## 2020-11-08 DIAGNOSIS — R2689 Other abnormalities of gait and mobility: Secondary | ICD-10-CM

## 2020-11-08 DIAGNOSIS — M159 Polyosteoarthritis, unspecified: Secondary | ICD-10-CM

## 2020-11-08 DIAGNOSIS — I69354 Hemiplegia and hemiparesis following cerebral infarction affecting left non-dominant side: Secondary | ICD-10-CM | POA: Diagnosis not present

## 2020-11-08 DIAGNOSIS — M8949 Other hypertrophic osteoarthropathy, multiple sites: Secondary | ICD-10-CM

## 2020-11-08 DIAGNOSIS — K5901 Slow transit constipation: Secondary | ICD-10-CM

## 2020-11-08 NOTE — Progress Notes (Signed)
Location:   [facility] Room Number: 147-A Place of Service:  SNF [phone]) Provider:  Windell Moulding, NP    Patient Care Team: Gayland Curry, DO as PCP - General (Geriatric Medicine) Wylene Simmer, MD as Consulting Physician (Orthopedic Surgery) Cameron Sprang, MD as Consulting Physician (Neurology)  Extended Emergency Contact Information Primary Emergency Contact: [facility] Address: [address]          [place], [place] [address] Johnnette Litter of [place] Mobile Phone: [phone] Relation: Sister Interpreter needed? No Secondary Emergency Contact: Bishop,Jim  [place] [employer] Phone: [phone] Relation: Brother  Code Status: DNR  Goals of care: Advanced Directive information Advanced Directives 11/08/2020  Does Patient Have a Medical Advance Directive? Yes  Type of Advance Directive Out of facility DNR (pink MOST or yellow form)  Does patient want to make changes to medical advance directive? No - Patient declined  Copy of [place] in Chart? -  Would patient like information on creating a medical advance directive? -     Chief Complaint  Patient presents with  . Medical Management of Chronic Issues    Routine Visit.  . Immunizations    Discuss the need for Tetanus Vaccine.    HPI:  Pt is a 75 y.o. female seen today for medical management of chronic diseases.    Today, she is sitting in her wheelchair during encounter. Alert and oriented x 3. Follows commands and can express needs. 03/17 she was seen by Dr. Link Snuffer for crown prep to right lower molar. Crown to be completed 04/21. She has been experiencing intermittent tooth pain since first procedure. She has been swishing with warm water and salt for pain. Today, she states her right lower molar is sore, pain rated 2/10. She has also been avoiding hard and chewy foods.   No recent falls or injuries. Continues to participate in PT/OT 2 times weekly.  Ambulating short distances. OT continues to apply splint to left middle finger. She complains of generalized arthritis in her joints. Reports tylenol does not always help with pain.   Recent weights are as follows:  04/01- 110.4 lbs  03/01- 109 lbs  Recent blood pressures are as follows:  04/13- 92/63  03/30- 119/74  Nurse does not report any concerns, vitals stable.      Past Medical History:  Diagnosis Date  . Anxiety   . Aortic atherosclerosis ([place])   . Arthritis   . Complication of anesthesia    per pt, slow to wake up past sedation.  . CVA (cerebral vascular accident) ([place]) 11/10/2019  . Depression   . Diverticulosis   . Hiatal hernia   . Hyperplastic colon polyp   . Hypertension   . Neuropathy   . Osteoarthritis   . Osteoporosis   . Renal mass   . Stomach upset    has a senstive stomach  . Throat clearing    has a cough at times  . Urinary incontinence   . Vitamin D deficiency    Past Surgical History:  Procedure Laterality Date  . COLONOSCOPY    . FACIAL COSMETIC SURGERY     over 15 years ago  . FINGER TENDON REPAIR     middle rt hand-  . HAMMER TOE SURGERY  12/27/2011   Procedure: HAMMER TOE CORRECTION;  Surgeon: Wylene Simmer, MD;  Location: [facility];  Service: Orthopedics;  Laterality: Left;  2-4th    Allergies  Allergen Reactions  .  Chlorhexidine     Allergies as of 11/08/2020      Reactions   Chlorhexidine       Medication List       Accurate as of November 08, 2020 11:28 AM. If you have any questions, ask your nurse or doctor.        acetaminophen 325 MG tablet Commonly known as: TYLENOL Take 650 mg by mouth at bedtime. And q 6 hrs prn   atorvastatin 40 MG tablet Commonly known as: LIPITOR Take 1 tablet (40 mg total) by mouth daily.   bisacodyl 5 MG EC tablet Generic drug: bisacodyl Take 5 mg by mouth daily as needed for mild constipation or moderate constipation.   bisacodyl 10 MG suppository Commonly known as:  DULCOLAX If not relieved by MOM, give 10 mg Bisacodyl suppositiory rectally X 1 dose in 24 hours as needed (Do not use constipation standing orders for residents with renal failure/CFR less than 30. Contact MD for orders) (Physician Order)   clopidogrel 75 MG tablet Commonly known as: PLAVIX Take 1 tablet (75 mg total) by mouth daily.   dicyclomine 10 MG capsule Commonly known as: BENTYL Take 10 mg by mouth 3 (three) times daily as needed for spasms.   escitalopram 10 MG tablet Commonly known as: Lexapro Take 1 tablet (10 mg total) by mouth daily.   magnesium hydroxide 400 MG/5ML suspension Commonly known as: MILK OF MAGNESIA If no BM in 3 days, give 30 cc Milk of Magnesium p.o. x 1 dose in 24 hours as needed (Do not use standing constipation orders for residents with renal failure CFR less than 30. Contact MD for orders) (Physician Order)   Melatonin 5 MG Caps Take 5 mg by mouth at bedtime.   metoprolol tartrate 25 MG tablet Commonly known as: LOPRESSOR Take 1 tablet (25 mg total) by mouth 2 (two) times daily.   NON FORMULARY Diet: Regular, Nectar Thick, Chopped/Dysphagia 3   Special Instructions: Aspiration precautions. May have bacon per SLP.   pantoprazole 20 MG tablet Commonly known as: PROTONIX Take 20 mg by mouth daily.   RA SALINE ENEMA RE If not relieved by Biscodyl suppository, give disposable Saline Enema rectally X 1 dose/24 hrs as needed (Do not use constipation standing orders for residents with renal failure/CFR less than 30. Contact MD for orders)(Physician Or   simethicone 80 MG chewable tablet Commonly known as: MYLICON Chew 1 tablet (80 mg total) by mouth every 8 (eight) hours as needed for flatulence.   UNABLE TO FIND Med Name: 1-2-3 Cream; 60 grams; amt: 1 application; topical  Special Instructions: Apply 1-2-3 cream twice daily to buttocks as needed for skin  flare up. Continue to use Desitin cream as a preventative. U       Review of Systems   Constitutional: Negative for activity change, appetite change and fatigue.  HENT: Positive for dental problem and trouble swallowing. Negative for hearing loss, rhinorrhea and sneezing.        Molar pain  Eyes: Negative for visual disturbance.       Glasses  Respiratory: Negative for cough, shortness of breath and wheezing.   Cardiovascular: Negative for chest pain and leg swelling.  Gastrointestinal: Positive for constipation. Negative for abdominal distention, abdominal pain, diarrhea and nausea.  Genitourinary: Negative for dysuria, frequency and hematuria.  Musculoskeletal: Positive for arthralgias, gait problem and myalgias.  Skin: Negative.   Neurological: Positive for weakness. Negative for dizziness and headaches.  Hematological: Bruises/bleeds easily.  Psychiatric/Behavioral: Negative for  confusion and dysphoric mood. The patient is not nervous/anxious.     Immunization History  Administered Date(s) Administered  . Fluad Quad(high Dose 65+) 04/27/2019  . Influenza, High Dose Seasonal PF 05/03/2017, 05/19/2018  . Influenza,inj,Quad PF,6+ Mos 04/29/2013, 05/18/2014, 05/02/2015, 04/17/2016  . Influenza-Unspecified 05/13/2020  . PFIZER(Purple Top)SARS-COV-2 Vaccination 08/27/2019, 09/21/2019, 06/13/2020  . Pneumococcal Conjugate-13 05/11/2013  . Pneumococcal Polysaccharide-23 05/02/2015   Pertinent  Health Maintenance Due  Topic Date Due  . INFLUENZA VACCINE  02/20/2021  . PNA vac Low Risk Adult  Completed  . DEXA SCAN  Discontinued  . COLONOSCOPY (Pts 45-89yrs Insurance coverage will need to be confirmed)  Discontinued   Fall Risk  09/26/2020 12/07/2019 04/27/2019 08/05/2018 06/17/2018  Falls in the past year? 0 0 1 0 0  Number falls in past yr: 0 0 0 - 0  Injury with Fall? 0 0 1 - 0   Functional Status Survey:    Vitals:   11/08/20 1123  BP: 92/63  Pulse: 76  Resp: 19  Temp: (!) 97 F (36.1 C)  SpO2: 91%  Weight: 110 lb 6.4 oz (50.1 kg)  Height: 5\' 2"  (1.575 m)    Body mass index is 20.19 kg/m. Physical Exam Vitals reviewed.  Constitutional:      General: She is not in acute distress. HENT:     Head: Normocephalic.     Right Ear: There is no impacted cerumen.     Left Ear: There is no impacted cerumen.     Nose: Nose normal.     Mouth/Throat:     Mouth: Mucous membranes are moist.  Eyes:     General:        Right eye: No discharge.        Left eye: No discharge.  Cardiovascular:     Rate and Rhythm: Normal rate and regular rhythm.     Pulses: Normal pulses.     Heart sounds: Normal heart sounds. No murmur heard.   Pulmonary:     Effort: Pulmonary effort is normal. No respiratory distress.     Breath sounds: Normal breath sounds. No wheezing.  Abdominal:     General: Bowel sounds are normal. There is no distension.     Palpations: Abdomen is soft.     Tenderness: There is no abdominal tenderness.  Musculoskeletal:     Cervical back: Normal range of motion.     Right lower leg: No edema.     Left lower leg: No edema.  Lymphadenopathy:     Cervical: No cervical adenopathy.  Skin:    General: Skin is warm and dry.     Capillary Refill: Capillary refill takes less than 2 seconds.  Neurological:     General: No focal deficit present.     Mental Status: She is alert and oriented to person, place, and time.     Motor: Weakness present.     Gait: Gait abnormal.     Comments: Walker/wheelchair  Psychiatric:        Mood and Affect: Mood normal. Affect is flat.        Behavior: Behavior normal.     Labs reviewed: Recent Labs    11/18/19 0343 11/19/19 0240 11/21/19 0308 11/22/19 0502 11/23/19 0356 11/25/19 0000 01/01/20 1255 01/18/20 0000 07/04/20 0000  NA 139   < > 147* 144 141   < > 141 140 140  K 4.4   < > 3.6 3.2* 3.8   < > 3.7 3.9 4.0  CL  108   < > 112* 112* 110   < > 105 103 109*  CO2 12*   < > 27 25 23    < > 21 24* 23*  GLUCOSE 123*   < > 110* 113* 102*  --   --   --   --   BUN 21   < > 29* 25* 17   < > 13 15  16   CREATININE 0.62   < > 0.65 0.60 0.56   < > 0.4* 0.3* 0.4*  CALCIUM 9.1   < > 9.2 8.8* 8.8*   < > 9.9 10.4 9.6  MG 2.0  --  2.2  --  1.8  --   --   --   --   PHOS  --   --  2.8  --   --   --   --   --   --    < > = values in this interval not displayed.   Recent Labs    11/20/19 0332 11/21/19 0308 11/22/19 0502 12/17/19 0500 12/24/19 0000 12/24/19 1255 01/18/20 0000  AST 30 26 22 22 23   --  63*  ALT 66* 51* 37 28 38*  --  105*  ALKPHOS 80 77 66  --   --   --  108  BILITOT 0.6 0.5 0.4  --   --   --   --   PROT 5.7* 5.7* 5.0*  --   --   --   --   ALBUMIN 2.3* 2.4* 2.1* 3.1*  --  3.2*  --    Recent Labs    11/21/19 0308 11/22/19 0502 11/23/19 0356 11/25/19 0000 12/17/19 0500 01/01/20 0000 01/18/20 0000  WBC 12.9* 13.9* 13.0*   < > 11.2 12.8 8.4  NEUTROABS 10.2* 11.1* 10.5*  --   --   --   --   HGB 14.0 12.4 13.0   < > 12.6 14.0 13.8  HCT 44.6 40.0 40.2   < > 38 43 42  MCV 98.0 98.8 96.6  --   --   --   --   PLT 442* 411* 431*   < > 319 318 318   < > = values in this interval not displayed.   Lab Results  Component Value Date   TSH 1.728 11/10/2019   Lab Results  Component Value Date   HGBA1C 5.1 11/11/2019   Lab Results  Component Value Date   CHOL 112 07/04/2020   HDL 56 07/04/2020   LDLCALC 42 07/04/2020   LDLDIRECT 116.0 04/27/2019   TRIG 69 07/04/2020   CHOLHDL 3.0 11/11/2019    Significant Diagnostic Results in last 30 days:  No results found.  Assessment/Plan: 1. Flaccid hemiplegia of left nondominant side as late effect of cerebral infarction ([place]) - ambulating short distances with PT - cont PT/OT - cont skilled nursing services - cont plavix ans statin   2. Dysphagia following cerebrovascular accident (CVA) - no recent aspirations - cont regular diet with nectar liquids  3. Reactive depression - flat affect - cont lexapro 10 mg po daily  4. Primary hypertension - SBP 90-110, asymptomatic - cont metoprolol regimen  5. Pure  hypercholesterolemia - stable with statin - cont diet low in fat ad avoid fried foods  6. Abnormality of gait due to impairment of balance - improving with PT/OT  7. Slow transit constipation - having regular bowel movements - encourage fluids - cone prn bisacodyl, ducolax, and milk of mag  8. Tooth pain - due to crown prep procedure - crown placement by Dr. Link Snuffer 04/21 - cont salt gargles   9. Primary osteoarthritis involving multiple joints - c/o joint stiffness, does not like tylenol - will increase tylenol to 1000 mg po bid prn      Family/ staff Communication:   Labs/tests ordered:

## 2020-12-15 ENCOUNTER — Encounter: Payer: Self-pay | Admitting: Adult Health

## 2020-12-15 ENCOUNTER — Non-Acute Institutional Stay (SKILLED_NURSING_FACILITY): Payer: Medicare Other | Admitting: Adult Health

## 2020-12-15 DIAGNOSIS — I69391 Dysphagia following cerebral infarction: Secondary | ICD-10-CM

## 2020-12-15 DIAGNOSIS — I69354 Hemiplegia and hemiparesis following cerebral infarction affecting left non-dominant side: Secondary | ICD-10-CM

## 2020-12-15 DIAGNOSIS — K5901 Slow transit constipation: Secondary | ICD-10-CM

## 2020-12-15 DIAGNOSIS — M810 Age-related osteoporosis without current pathological fracture: Secondary | ICD-10-CM

## 2020-12-15 DIAGNOSIS — Z66 Do not resuscitate: Secondary | ICD-10-CM | POA: Diagnosis not present

## 2020-12-15 DIAGNOSIS — F32A Depression, unspecified: Secondary | ICD-10-CM

## 2020-12-15 DIAGNOSIS — F320 Major depressive disorder, single episode, mild: Secondary | ICD-10-CM

## 2020-12-15 NOTE — Progress Notes (Signed)
Location:  [facility] Room Number: 147-A Place of Service:  SNF [phone]) Provider:  Royal Hawthorn, NP   Patient Care Team: [name], MD as PCP - General (Internal Medicine) Wylene Simmer, MD as Consulting Physician (Orthopedic Surgery) Cameron Sprang, MD as Consulting Physician (Neurology)  Extended Emergency Contact Information Primary Emergency Contact: [facility] Address: [address]          [place], [place] [address] Johnnette Litter of [place] Mobile Phone: [phone] Relation: Sister Interpreter needed? No Secondary Emergency Contact: Kaner,Jim  [place] [employer] Phone: [phone] Relation: Brother  Code Status:  DNR Goals of care: Advanced Directive information Advanced Directives 12/15/2020  Does Patient Have a Medical Advance Directive? Yes  Type of Paramedic of [place];Living will;Out of facility DNR (pink MOST or yellow form)  Does patient want to make changes to medical advance directive? No - Patient declined  Copy of [place] in Chart? Yes - validated most recent copy scanned in chart (See row information)  Would patient like information on creating a medical advance directive? -  Pre-existing out of facility DNR order (yellow form or pink MOST form) Yellow form placed in chart (order not valid for inpatient use);Pink MOST form placed in chart (order not valid for inpatient use)     Chief Complaint  Patient presents with  . Medical Management of Chronic Issues    Routine Visit and discuss need for TD/Tdap or exclude     HPI:  Pt is a 75 y.o. female seen today for medical management of chronic diseases.   PMH significant for CVA 4/21 with left sided weakness. She is able to transfer with assistance, alert, oriented, but has some dysphagia and dysphonia.   On 3/7 she was seen by neurology and placed on lexapro for depression/anxiety related to her CVA.  She  still feels some anxiety about her health and skilled level of care. Can't really tell a difference with the med at this time.  She is sleeping fairly well. Appetite is good. Weight is stable.  Metoprolol was decreased due to low bp and she has tolerated this well.   Wt Readings from Last 3 Encounters:  12/15/20 110 lb 6.4 oz (50.1 kg)  11/08/20 110 lb 6.4 oz (50.1 kg)  10/06/20 111 lb 6.4 oz (50.5 kg)   She reports some constipation   Past Medical History:  Diagnosis Date  . Anxiety   . Aortic atherosclerosis ([place])   . Arthritis   . Complication of anesthesia    per pt, slow to wake up past sedation.  . CVA (cerebral vascular accident) ([place]) 11/10/2019  . Depression   . Diverticulosis   . Hiatal hernia   . Hyperplastic colon polyp   . Hypertension   . Neuropathy   . Osteoarthritis   . Osteoporosis   . Renal mass   . Stomach upset    has a senstive stomach  . Throat clearing    has a cough at times  . Urinary incontinence   . Vitamin D deficiency    Past Surgical History:  Procedure Laterality Date  . COLONOSCOPY    . FACIAL COSMETIC SURGERY     over 15 years ago  . FINGER TENDON REPAIR     middle rt hand-  . HAMMER TOE SURGERY  12/27/2011   Procedure: HAMMER TOE CORRECTION;  Surgeon: Wylene Simmer, MD;  Location: [facility];  Service: Orthopedics;  Laterality: Left;  2-4th    Allergies  Allergen Reactions  . Chlorhexidine     Outpatient Encounter Medications as of 12/15/2020  Medication Sig  . acetaminophen (TYLENOL) 325 MG tablet Take 650 mg by mouth at bedtime. And q 6 hrs prn  . acetaminophen (TYLENOL) 500 MG tablet Take 1,000 mg by mouth 2 (two) times daily as needed for moderate pain or mild pain.  Marland Kitchen atorvastatin (LIPITOR) 40 MG tablet Take 1 tablet (40 mg total) by mouth daily.  . bisacodyl (BISACODYL) 5 MG EC tablet Take 5 mg by mouth daily as needed for mild constipation or moderate constipation.  . bisacodyl (DULCOLAX) 10 MG suppository If  not relieved by MOM, give 10 mg Bisacodyl suppositiory rectally X 1 dose in 24 hours as needed (Do not use constipation standing orders for residents with renal failure/CFR less than 30. Contact MD for orders) (Physician Order)  . clopidogrel (PLAVIX) 75 MG tablet Take 1 tablet (75 mg total) by mouth daily.  Marland Kitchen dicyclomine (BENTYL) 10 MG capsule Take 10 mg by mouth 3 (three) times daily as needed for spasms.  Marland Kitchen escitalopram (LEXAPRO) 10 MG tablet Take 1 tablet (10 mg total) by mouth daily.  . magnesium hydroxide (MILK OF MAGNESIA) 400 MG/5ML suspension If no BM in 3 days, give 30 cc Milk of Magnesium p.o. x 1 dose in 24 hours as needed (Do not use standing constipation orders for residents with renal failure CFR less than 30. Contact MD for orders) (Physician Order)  . Melatonin 5 MG CAPS Take 5 mg by mouth at bedtime.  . metoprolol tartrate (LOPRESSOR) 25 MG tablet Take 12.5 mg by mouth 2 (two) times daily.  . NON FORMULARY Diet: Regular, Nectar Thick, Chopped/Dysphagia 3   Special Instructions: Aspiration precautions. May have bacon per SLP.  Marland Kitchen pantoprazole (PROTONIX) 20 MG tablet Take 20 mg by mouth daily.  . simethicone (MYLICON) 80 MG chewable tablet Chew 1 tablet (80 mg total) by mouth every 8 (eight) hours as needed for flatulence.  . Sodium Phosphates (RA SALINE ENEMA RE) If not relieved by Biscodyl suppository, give disposable Saline Enema rectally X 1 dose/24 hrs as needed (Do not use constipation standing orders for residents with renal failure/CFR less than 30. Contact MD for orders)(Physician Or  . UNABLE TO FIND Med Name: 1-2-3 Cream; 60 grams; amt: 1 application; topical  Special Instructions: Apply 1-2-3 cream twice daily to buttocks as needed for skin  flare up. Continue to use Desitin cream as a preventative. U  . [DISCONTINUED] metoprolol tartrate (LOPRESSOR) 25 MG tablet Take 1 tablet (25 mg total) by mouth 2 (two) times daily.   No facility-administered encounter medications on  file as of 12/15/2020.    Review of Systems  Constitutional: Negative for activity change, appetite change, chills, diaphoresis, fatigue, fever and unexpected weight change.  HENT: Negative for congestion.   Respiratory: Negative for cough, shortness of breath and wheezing.   Cardiovascular: Negative for chest pain, palpitations and leg swelling.  Gastrointestinal: Positive for constipation. Negative for abdominal distention, abdominal pain and diarrhea.  Genitourinary: Negative for difficulty urinating and dysuria.  Musculoskeletal: Positive for gait problem. Negative for arthralgias, back pain, joint swelling and myalgias.  Neurological: Positive for weakness. Negative for dizziness, tremors, seizures, syncope, facial asymmetry, speech difficulty, light-headedness, numbness and headaches.  Psychiatric/Behavioral: Negative for agitation, behavioral problems, confusion, self-injury, sleep disturbance and suicidal ideas. The patient is nervous/anxious.     Immunization History  Administered Date(s) Administered  . Fluad Quad(high Dose 65+)  04/27/2019  . Influenza, High Dose Seasonal PF 05/03/2017, 05/19/2018  . Influenza,inj,Quad PF,6+ Mos 04/29/2013, 05/18/2014, 05/02/2015, 04/17/2016  . Influenza-Unspecified 05/13/2020  . PFIZER(Purple Top)SARS-COV-2 Vaccination 08/27/2019, 09/21/2019, 06/13/2020  . Pneumococcal Conjugate-13 05/11/2013  . Pneumococcal Polysaccharide-23 05/02/2015   Pertinent  Health Maintenance Due  Topic Date Due  . INFLUENZA VACCINE  02/20/2021  . PNA vac Low Risk Adult  Completed  . DEXA SCAN  Discontinued  . COLONOSCOPY (Pts 45-25yrs Insurance coverage will need to be confirmed)  Discontinued   Fall Risk  09/26/2020 12/07/2019 04/27/2019 08/05/2018 06/17/2018  Falls in the past year? 0 0 1 0 0  Number falls in past yr: 0 0 0 - 0  Injury with Fall? 0 0 1 - 0   Functional Status Survey:    Vitals:   12/15/20 1018  BP: 129/86  Pulse: 60  Resp: 20  Temp: (!)  97.5 F (36.4 C)  SpO2: 96%  Weight: 110 lb 6.4 oz (50.1 kg)  Height: 5\' 2"  (1.575 m)   Body mass index is 20.19 kg/m. Physical Exam Vitals and nursing note reviewed.  Constitutional:      General: She is not in acute distress.    Appearance: She is not diaphoretic.  HENT:     Head: Normocephalic and atraumatic.     Mouth/Throat:     Mouth: Mucous membranes are moist.     Pharynx: Oropharynx is clear. No oropharyngeal exudate.  Neck:     Vascular: No JVD.  Cardiovascular:     Rate and Rhythm: Normal rate and regular rhythm.     Heart sounds: No murmur heard.   Pulmonary:     Effort: Pulmonary effort is normal. No respiratory distress.     Breath sounds: Normal breath sounds. No wheezing.  Abdominal:     General: Abdomen is flat. Bowel sounds are normal.     Palpations: Abdomen is soft.  Musculoskeletal:     Cervical back: No rigidity or tenderness.     Right lower leg: No edema.     Left lower leg: No edema.  Lymphadenopathy:     Cervical: No cervical adenopathy.  Skin:    General: Skin is warm and dry.  Neurological:     Mental Status: She is alert and oriented to person, place, and time.     Comments: Left sided weakness.  Psychiatric:        Mood and Affect: Mood normal.     Labs reviewed: Recent Labs    01/01/20 1255 01/18/20 0000 07/04/20 0000  NA 141 140 140  K 3.7 3.9 4.0  CL 105 103 109*  CO2 21 24* 23*  BUN 13 15 16   CREATININE 0.4* 0.3* 0.4*  CALCIUM 9.9 10.4 9.6   Recent Labs    12/17/19 0500 12/24/19 0000 12/24/19 1255 01/18/20 0000  AST 22 23  --  63*  ALT 28 38*  --  105*  ALKPHOS  --   --   --  108  ALBUMIN 3.1*  --  3.2*  --    Recent Labs    12/17/19 0500 01/01/20 0000 01/18/20 0000  WBC 11.2 12.8 8.4  HGB 12.6 14.0 13.8  HCT 38 43 42  PLT 319 318 318   Lab Results  Component Value Date   TSH 1.728 11/10/2019   Lab Results  Component Value Date   HGBA1C 5.1 11/11/2019   Lab Results  Component Value Date    CHOL 112 07/04/2020   HDL 56  07/04/2020   LDLCALC 42 07/04/2020   LDLDIRECT 116.0 04/27/2019   TRIG 69 07/04/2020   CHOLHDL 3.0 11/11/2019    Significant Diagnostic Results in last 30 days:  No results found.  Assessment/Plan  1. Do not resuscitate Order updated  - Do not attempt resuscitation (DNR)  2. Slow transit constipation Add colace 100 mg qd   3. Dysphagia following cerebrovascular accident (CVA) Continue D3 diet and asp prec  4. 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 Age-related osteoporosis without current pathological fracture She is minimally ambulatory and in skilled care so at this point further meds and dexa scans would not be warranted.   6. Mild depression ([place]) Continue lexapro, pt stated she will f/u with neurology   Labs/tests ordered:  Due next month

## 2020-12-26 ENCOUNTER — Encounter: Payer: Self-pay | Admitting: Adult Health

## 2020-12-26 ENCOUNTER — Non-Acute Institutional Stay (SKILLED_NURSING_FACILITY): Payer: Medicare Other | Admitting: Adult Health

## 2020-12-26 DIAGNOSIS — W19XXXA Unspecified fall, initial encounter: Secondary | ICD-10-CM

## 2020-12-26 DIAGNOSIS — M25561 Pain in right knee: Secondary | ICD-10-CM

## 2020-12-26 MED ORDER — DICLOFENAC SODIUM 1 % EX GEL: 4.0000 g | Freq: Four times a day (QID) | CUTANEOUS | 1 refills | Status: DC

## 2020-12-26 MED ORDER — TRAMADOL HCL 50 MG PO TABS: 25.0000 mg | ORAL_TABLET | Freq: Two times a day (BID) | ORAL | 0 refills | Status: AC | PRN

## 2020-12-26 NOTE — Progress Notes (Signed)
Location:     [facility] Room Number: 147 Place of Service:  SNF [phone]) Provider:  Royal Hawthorn, NP  Lynn Dad, MD  Patient Care Team: Lynn Dad, MD as PCP - General (Internal Medicine) Wylene Simmer, MD as Consulting Physician (Orthopedic Surgery) Cameron Sprang, MD as Consulting Physician (Neurology)  Extended Emergency Contact Information Primary Emergency Contact: [facility] Address: [address]          [place], [place] [address] Lynn [name] of [place] Mobile Phone: [phone] Relation: Sister Interpreter needed? No Secondary Emergency Contact: Jemal Miskell,Lynn [name]  [place] [employer] Phone: [phone] Relation: Brother  Code Status:  DNR Goals of care: Advanced Directive information Advanced Directives 12/15/2020  Does Patient Have a Medical Advance Directive? Yes  Type of Paramedic of [place];Living will;Out of facility DNR (pink MOST or yellow form)  Does patient want to make changes to medical advance directive? No - Patient declined  Copy of [place] in Chart? Yes - validated most recent copy scanned in chart (See row information)  Would patient like information on creating a medical advance directive? -  Pre-existing out of facility DNR order (yellow form or pink MOST form) Yellow form placed in chart (order not valid for inpatient use);Pink MOST form placed in chart (order not valid for inpatient use)     Chief Complaint  Patient presents with  . Acute Visit    Knee pain    HPI:  Pt is a 75 y.o. female seen today for an acute visit for right knee pain. The nurse reports that she several days ago on both knees. She was trying to get up without help and has a hx of CVA with left sided weakness. At the time there was no obvious injury but over the past 3 days she has developed pain in the right knee. Very mild swelling, no deformity or bruising. Hurts to bear  weight on the anterior knee. Has tried tylenol without relief and is requesting something stronger.    Past Medical History:  Diagnosis Date  . Anxiety   . Aortic atherosclerosis ([place])   . Arthritis   . Complication of anesthesia    per pt, slow to wake up past sedation.  . CVA (cerebral vascular accident) ([place]) 11/10/2019  . Depression   . Diverticulosis   . Hiatal hernia   . Hyperplastic colon polyp   . Hypertension   . Neuropathy   . Osteoarthritis   . Osteoporosis   . Renal mass   . Stomach upset    has a senstive stomach  . Throat clearing    has a cough at times  . Urinary incontinence   . Vitamin D deficiency    Past Surgical History:  Procedure Laterality Date  . COLONOSCOPY    . FACIAL COSMETIC SURGERY     over 15 years ago  . FINGER TENDON REPAIR     middle rt hand-  . HAMMER TOE SURGERY  12/27/2011   Procedure: HAMMER TOE CORRECTION;  Surgeon: Wylene Simmer, MD;  Location: [facility];  Service: Orthopedics;  Laterality: Left;  2-4th    Allergies  Allergen Reactions  . Chlorhexidine     Allergies as of 12/26/2020      Reactions   Chlorhexidine       Medication List       Accurate as of December 26, 2020  9:56 AM. If you have any questions, ask your  nurse or doctor.        acetaminophen 325 MG tablet Commonly known as: TYLENOL Take 650 mg by mouth at bedtime. And q 6 hrs prn   acetaminophen 500 MG tablet Commonly known as: TYLENOL Take 1,000 mg by mouth 2 (two) times daily as needed for moderate pain or mild pain.   atorvastatin 40 MG tablet Commonly known as: LIPITOR Take 1 tablet (40 mg total) by mouth daily.   bisacodyl 5 MG EC tablet Generic drug: bisacodyl Take 5 mg by mouth daily as needed for mild constipation or moderate constipation.   bisacodyl 10 MG suppository Commonly known as: DULCOLAX If not relieved by MOM, give 10 mg Bisacodyl suppositiory rectally X 1 dose in 24 hours as needed (Do not use constipation standing  orders for residents with renal failure/CFR less than 30. Contact MD for orders) (Physician Order)   clopidogrel 75 MG tablet Commonly known as: PLAVIX Take 1 tablet (75 mg total) by mouth daily.   dicyclomine 10 MG capsule Commonly known as: BENTYL Take 10 mg by mouth 3 (three) times daily as needed for spasms.   docusate sodium 100 MG capsule Commonly known as: COLACE Take 100 mg by mouth daily.   escitalopram 10 MG tablet Commonly known as: Lexapro Take 1 tablet (10 mg total) by mouth daily.   magnesium hydroxide 400 MG/5ML suspension Commonly known as: MILK OF MAGNESIA If no BM in 3 days, give 30 cc Milk of Magnesium p.o. x 1 dose in 24 hours as needed (Do not use standing constipation orders for residents with renal failure CFR less than 30. Contact MD for orders) (Physician Order)   Melatonin 5 MG Caps Take 5 mg by mouth at bedtime.   metoprolol tartrate 25 MG tablet Commonly known as: LOPRESSOR Take 12.5 mg by mouth 2 (two) times daily.   NON FORMULARY Diet: Regular, Nectar Thick, Chopped/Dysphagia 3   Special Instructions: Aspiration precautions. May have bacon per SLP.   pantoprazole 20 MG tablet Commonly known as: PROTONIX Take 20 mg by mouth daily.   RA SALINE ENEMA RE If not relieved by Biscodyl suppository, give disposable Saline Enema rectally X 1 dose/24 hrs as needed (Do not use constipation standing orders for residents with renal failure/CFR less than 30. Contact MD for orders)(Physician Or   simethicone 80 MG chewable tablet Commonly known as: MYLICON Chew 1 tablet (80 mg total) by mouth every 8 (eight) hours as needed for flatulence.   UNABLE TO FIND Med Name: 1-2-3 Cream; 60 grams; amt: 1 application; topical  Special Instructions: Apply 1-2-3 cream twice daily to buttocks as needed for skin  flare up. Continue to use Desitin cream as a preventative. U       Review of Systems  Constitutional: Positive for activity change. Negative for  appetite change, chills, diaphoresis, fatigue, fever and unexpected weight change.  Musculoskeletal: Positive for gait problem.       Right knee pain mild swelling  Neurological: Positive for weakness. Negative for dizziness, tremors, seizures, syncope, facial asymmetry, speech difficulty, light-headedness, numbness and headaches.    Immunization History  Administered Date(s) Administered  . Fluad Quad(high Dose 65+) 04/27/2019  . Influenza, High Dose Seasonal PF 05/03/2017, 05/19/2018  . Influenza,inj,Quad PF,6+ Mos 04/29/2013, 05/18/2014, 05/02/2015, 04/17/2016  . Influenza-Unspecified 05/13/2020  . PFIZER(Purple Top)SARS-COV-2 Vaccination 08/27/2019, 09/21/2019, 06/13/2020  . Pneumococcal Conjugate-13 05/11/2013  . Pneumococcal Polysaccharide-23 05/02/2015   Pertinent  Health Maintenance Due  Topic Date Due  . INFLUENZA VACCINE  02/20/2021  .  PNA vac Low Risk Adult  Completed  . DEXA SCAN  Discontinued  . COLONOSCOPY (Pts 45-57yrs Insurance coverage will need to be confirmed)  Discontinued   Fall Risk  09/26/2020 12/07/2019 04/27/2019 08/05/2018 06/17/2018  Falls in the past year? 0 0 1 0 0  Number falls in past yr: 0 0 0 - 0  Injury with Fall? 0 0 1 - 0   Functional Status Survey:    Vitals:   12/26/20 0943  BP: 132/79  Pulse: (!) 55  Resp: 16  Temp: (!) 97.2 F (36.2 C)  SpO2: 97%  Weight: 119 lb (54 kg)  Height: 5\' 2"  (1.575 m)   Body mass index is 21.77 kg/m. Physical Exam Vitals and nursing note reviewed.  Constitutional:      Appearance: Normal appearance.  Musculoskeletal:     Right knee: Swelling (mild) present. No deformity, erythema, bony tenderness or crepitus. Normal range of motion. No tenderness. No LCL laxity, MCL laxity, ACL laxity or PCL laxity. Normal alignment, normal meniscus and normal patellar mobility. Normal pulse.     Instability Tests: Anterior drawer test negative.     Left knee: Normal.     Right lower leg: No edema.     Left lower leg:  No edema.  Neurological:     Mental Status: She is alert.     Labs reviewed: Recent Labs    01/01/20 1255 01/18/20 0000 07/04/20 0000  NA 141 140 140  K 3.7 3.9 4.0  CL 105 103 109*  CO2 21 24* 23*  BUN 13 15 16   CREATININE 0.4* 0.3* 0.4*  CALCIUM 9.9 10.4 9.6   Recent Labs    01/18/20 0000  AST 63*  ALT 105*  ALKPHOS 108   Recent Labs    01/01/20 0000 01/18/20 0000  WBC 12.8 8.4  HGB 14.0 13.8  HCT 43 42  PLT 318 318   Lab Results  Component Value Date   TSH 1.728 11/10/2019   Lab Results  Component Value Date   HGBA1C 5.1 11/11/2019   Lab Results  Component Value Date   CHOL 112 07/04/2020   HDL 56 07/04/2020   LDLCALC 42 07/04/2020   LDLDIRECT 116.0 04/27/2019   TRIG 69 07/04/2020   CHOLHDL 3.0 11/11/2019    Significant Diagnostic Results in last 30 days:  No results found.  Assessment/Plan  1. Fall, initial encounter Due to getting up without help.  Encourage resident to call for assistance all all times.   2. Acute pain of right knee Check xray, has mild edema but no joint laxity  - traMADol (ULTRAM) 50 MG tablet; Take 0.5 tablets (25 mg total) by mouth every 12 (twelve) hours as needed for up to 5 days.  Dispense: 15 tablet; Refill: 0  - diclofenac Sodium (VOLTAREN) 1 % GEL; Apply 4 g topically 4 (four) times daily. X 2 weeks  Dispense: 4 g; Refill: 1   Family/ staff Communication: resident and her nurse Jasmine   Labs/tests ordered:  Xray of the right knee 2 view

## 2021-01-19 ENCOUNTER — Encounter: Payer: Self-pay | Admitting: Adult Health

## 2021-01-19 ENCOUNTER — Non-Acute Institutional Stay (SKILLED_NURSING_FACILITY): Payer: Medicare Other | Admitting: Adult Health

## 2021-01-19 DIAGNOSIS — K219 Gastro-esophageal reflux disease without esophagitis: Secondary | ICD-10-CM

## 2021-01-19 DIAGNOSIS — M25561 Pain in right knee: Secondary | ICD-10-CM

## 2021-01-19 DIAGNOSIS — I69391 Dysphagia following cerebral infarction: Secondary | ICD-10-CM

## 2021-01-19 DIAGNOSIS — K5901 Slow transit constipation: Secondary | ICD-10-CM

## 2021-01-19 DIAGNOSIS — I10 Essential (primary) hypertension: Secondary | ICD-10-CM

## 2021-01-19 DIAGNOSIS — I69354 Hemiplegia and hemiparesis following cerebral infarction affecting left non-dominant side: Secondary | ICD-10-CM | POA: Diagnosis not present

## 2021-01-19 NOTE — Progress Notes (Signed)
Location:   [place] Room Number: 147-A Place of Service:  SNF [phone]) Provider:  Royal Hawthorn, NP    Patient Care Team: [name], MD as PCP - General (Internal Medicine) Wylene Simmer, MD as Consulting Physician (Orthopedic Surgery) Cameron Sprang, MD as Consulting Physician (Neurology)  Extended Emergency Contact Information Primary Emergency Contact: [facility] Address: [address]          [place], [place] [address] Johnnette Litter of [place] Mobile Phone: [phone] Relation: Sister Interpreter needed? No Secondary Emergency Contact: Busk,Jim  [place] Mobile Phone: [phone] Relation: Brother  Code Status:  DNR Goals of care: Advanced Directive information Advanced Directives 01/19/2021  Does Patient Have a Medical Advance Directive? Yes  Type of Paramedic of [place];Living will;Out of facility DNR (pink MOST or yellow form)  Does patient want to make changes to medical advance directive? No - Patient declined  Copy of [place] in Chart? Yes - validated most recent copy scanned in chart (See row information)  Would patient like information on creating a medical advance directive? -  Pre-existing out of facility DNR order (yellow form or pink MOST form) -     Chief Complaint  Patient presents with   Medical Management of Chronic Issues    Routine Visit.   Immunizations    Discuss the need for Shingrix vaccine, Tetanus vaccine, and 2nd Covid Booster.    HPI:  Pt is a 75 y.o. female seen today for medical management of chronic diseases.   PMH significant for CVA 4/21 with left sided weakness. She is able to transfer with assistance, ambulate short distances with a walker, alert, oriented, but has some dysphagia and dysphonia.  No current issues with coughing or choking on a D3 diet BP [id number] systolic, tolerated dose reduction of metoprolol Reports constipation at times,  colace helps Denies any issues with indigestion or gerd, continues on protonix for prior hx of epigastric pain Reports occasional issues sleeping at night currently on melatonin Has right knee pain due to a fall earlier this month with a negative xray.  Shifts wait to the right sometimes due to left sided weakness but feel overall the pain improved   Past Medical History:  Diagnosis Date   Anxiety    Aortic atherosclerosis (HCC)    Arthritis    Complication of anesthesia    per pt, slow to wake up past sedation.   CVA (cerebral vascular accident) ([place]) 11/10/2019   Depression    Diverticulosis    Hiatal hernia    Hyperplastic colon polyp    Hypertension    Neuropathy    Osteoarthritis    Osteoporosis    Renal mass    Stomach upset    has a senstive stomach   Throat clearing    has a cough at times   Urinary incontinence    Vitamin D deficiency    Past Surgical History:  Procedure Laterality Date   COLONOSCOPY     FACIAL COSMETIC SURGERY     over 15 years ago   FINGER TENDON REPAIR     middle rt hand-   HAMMER TOE SURGERY  12/27/2011   Procedure: HAMMER TOE CORRECTION;  Surgeon: Wylene Simmer, MD;  Location: [facility];  Service: Orthopedics;  Laterality: Left;  2-4th    Allergies  Allergen Reactions   Chlorhexidine     Allergies as of 01/19/2021       Reactions  Chlorhexidine         Medication List        Accurate as of January 19, 2021 11:05 AM. If you have any questions, ask your nurse or doctor.          acetaminophen 325 MG tablet Commonly known as: TYLENOL Take 650 mg by mouth at bedtime. And q 6 hrs prn   acetaminophen 500 MG tablet Commonly known as: TYLENOL Take 1,000 mg by mouth 2 (two) times daily as needed for moderate pain or mild pain.   atorvastatin 40 MG tablet Commonly known as: LIPITOR Take 1 tablet (40 mg total) by mouth daily.   bisacodyl 5 MG EC tablet Generic drug: bisacodyl Take 5 mg by mouth daily as needed  for mild constipation or moderate constipation.   bisacodyl 10 MG suppository Commonly known as: DULCOLAX If not relieved by MOM, give 10 mg Bisacodyl suppositiory rectally X 1 dose in 24 hours as needed (Do not use constipation standing orders for residents with renal failure/CFR less than 30. Contact MD for orders) (Physician Order)   clopidogrel 75 MG tablet Commonly known as: PLAVIX Take 1 tablet (75 mg total) by mouth daily.   diclofenac Sodium 1 % Gel Commonly known as: Voltaren Apply 4 g topically 4 (four) times daily. X 2 weeks   dicyclomine 10 MG capsule Commonly known as: BENTYL Take 10 mg by mouth 3 (three) times daily as needed for spasms.   docusate sodium 100 MG capsule Commonly known as: COLACE Take 100 mg by mouth daily.   escitalopram 10 MG tablet Commonly known as: Lexapro Take 1 tablet (10 mg total) by mouth daily.   magnesium hydroxide 400 MG/5ML suspension Commonly known as: MILK OF MAGNESIA If no BM in 3 days, give 30 cc Milk of Magnesium p.o. x 1 dose in 24 hours as needed (Do not use standing constipation orders for residents with renal failure CFR less than 30. Contact MD for orders) (Physician Order)   Melatonin 5 MG Caps Take 5 mg by mouth at bedtime.   metoprolol tartrate 25 MG tablet Commonly known as: LOPRESSOR Take 12.5 mg by mouth 2 (two) times daily.   NON FORMULARY Diet: Regular, Nectar Thick, Chopped/Dysphagia 3   Special Instructions: Aspiration precautions. May have bacon per SLP.   pantoprazole 20 MG tablet Commonly known as: PROTONIX Take 20 mg by mouth daily.   RA SALINE ENEMA RE If not relieved by Biscodyl suppository, give disposable Saline Enema rectally X 1 dose/24 hrs as needed (Do not use constipation standing orders for residents with renal failure/CFR less than 30. Contact MD for orders)(Physician Or   simethicone 80 MG chewable tablet Commonly known as: MYLICON Chew 1 tablet (80 mg total) by mouth every 8 (eight)  hours as needed for flatulence.   UNABLE TO FIND Med Name: 1-2-3 Cream; 60 grams; amt: 1 application; topical  Special Instructions: Apply 1-2-3 cream twice daily to buttocks as needed for skin  flare up. Continue to use Desitin cream as a preventative. U        Review of Systems  Constitutional:  Negative for activity change, appetite change, chills, diaphoresis, fatigue, fever and unexpected weight change.  HENT:  Negative for congestion.   Respiratory:  Negative for cough, shortness of breath and wheezing.   Cardiovascular:  Negative for chest pain, palpitations and leg swelling.  Gastrointestinal:  Negative for abdominal distention, abdominal pain, constipation and diarrhea.  Genitourinary:  Negative for difficulty urinating and dysuria.  Musculoskeletal:  Positive for arthralgias (right knee) and gait problem. Negative for back pain, joint swelling and myalgias.  Neurological:  Positive for speech difficulty and weakness. Negative for dizziness, tremors, seizures, syncope, facial asymmetry, light-headedness, numbness (left hand cold) and headaches.  Psychiatric/Behavioral:  Positive for sleep disturbance. Negative for agitation, behavioral problems and confusion.    Immunization History  Administered Date(s) Administered   Fluad Quad(high Dose 65+) 04/27/2019   Influenza, High Dose Seasonal PF 05/03/2017, 05/19/2018   Influenza,inj,Quad PF,6+ Mos 04/29/2013, 05/18/2014, 05/02/2015, 04/17/2016   Influenza-Unspecified 05/13/2020   PFIZER(Purple Top)SARS-COV-2 Vaccination 08/27/2019, 09/21/2019, 06/13/2020   Pneumococcal Conjugate-13 05/11/2013   Pneumococcal Polysaccharide-23 05/02/2015   Pertinent  Health Maintenance Due  Topic Date Due   INFLUENZA VACCINE  02/20/2021   PNA vac Low Risk Adult  Completed   DEXA SCAN  Discontinued   COLONOSCOPY (Pts 45-20yrs Insurance coverage will need to be confirmed)  Discontinued   Fall Risk  09/26/2020 12/07/2019 04/27/2019 08/05/2018  06/17/2018  Falls in the past year? 0 0 1 0 0  Number falls in past yr: 0 0 0 - 0  Injury with Fall? 0 0 1 - 0   Functional Status Survey:    Vitals:   01/19/21 1101  BP: 100/67  Pulse: 60  Resp: (!) 21  Temp: 98.1 F (36.7 C)  SpO2: 97%  Weight: 119 lb (54 kg)  Height: 5\' 2"  (1.575 m)   Body mass index is 21.77 kg/m. Physical Exam Vitals and nursing note reviewed.  Constitutional:      General: She is not in acute distress.    Appearance: She is not diaphoretic.  HENT:     Head: Normocephalic and atraumatic.  Neck:     Vascular: No JVD.  Cardiovascular:     Rate and Rhythm: Normal rate and regular rhythm.     Heart sounds: No murmur heard. Pulmonary:     Effort: Pulmonary effort is normal. No respiratory distress.     Breath sounds: Normal breath sounds. No wheezing.  Abdominal:     General: Bowel sounds are normal.     Palpations: Abdomen is soft.     Tenderness: There is no abdominal tenderness.  Musculoskeletal:        General: No swelling, tenderness, deformity or signs of injury.     Right lower leg: No edema.     Left lower leg: No edema.  Skin:    General: Skin is warm and dry.  Neurological:     Mental Status: She is alert and oriented to person, place, and time.     Comments: Dysphonia, left sided weakness  Psychiatric:        Mood and Affect: Mood normal.    Labs reviewed: Recent Labs    07/04/20 0000  NA 140  K 4.0  CL 109*  CO2 23*  BUN 16  CREATININE 0.4*  CALCIUM 9.6   No results for input(s): AST, ALT, ALKPHOS, BILITOT, PROT, ALBUMIN in the last 8760 hours. No results for input(s): WBC, NEUTROABS, HGB, HCT, MCV, PLT in the last 8760 hours. Lab Results  Component Value Date   TSH 1.728 11/10/2019   Lab Results  Component Value Date   HGBA1C 5.1 11/11/2019   Lab Results  Component Value Date   CHOL 112 07/04/2020   HDL 56 07/04/2020   LDLCALC 42 07/04/2020   LDLDIRECT 116.0 04/27/2019   TRIG 69 07/04/2020   CHOLHDL 3.0  11/11/2019    Significant Diagnostic Results in last  30 days:  No results found.  Assessment/Plan 1. 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 Acute pain of right knee Improving  3. Primary hypertension Controlled Continue metoprolol   4. Gastroesophageal reflux disease without esophagitis No current symptoms Continue protonix   5. Dysphagia following cerebrovascular accident (CVA) Continue modified diet and asp prec  6. Slow transit constipation Continue colace 100 mg qd     Labs: CBC and BMP 7/5  Due for tdap and shingrix, both ordered at [facility]

## 2021-01-24 LAB — COMPREHENSIVE METABOLIC PANEL
Albumin: 3.9 (ref 3.5–5.0)
Calcium: 9.7 (ref 8.7–10.7)
Globulin: 2.1

## 2021-01-24 LAB — BASIC METABOLIC PANEL
BUN: 19 (ref 4–21)
CO2: 24 — AB (ref 13–22)
Chloride: 106 (ref 99–108)
Creatinine: 0.6 (ref 0.5–1.1)
Glucose: 75
Potassium: 3.9 (ref 3.4–5.3)
Sodium: 141 (ref 137–147)

## 2021-01-24 LAB — CBC AND DIFFERENTIAL
HCT: 43 (ref 36–46)
Hemoglobin: 14.2 (ref 12.0–16.0)
Platelets: 236 (ref 150–399)
WBC: 7

## 2021-01-24 LAB — HEPATIC FUNCTION PANEL
ALT: 24 (ref 7–35)
AST: 14 (ref 13–35)
Alkaline Phosphatase: 112 (ref 25–125)
Bilirubin, Total: 0.4

## 2021-01-24 LAB — CBC: RBC: 4.53 (ref 3.87–5.11)

## 2021-01-25 ENCOUNTER — Ambulatory Visit: Payer: Medicare Other | Admitting: Neurology

## 2021-01-30 ENCOUNTER — Other Ambulatory Visit: Payer: Self-pay | Admitting: Neurology

## 2021-01-30 MED ORDER — ESCITALOPRAM OXALATE 20 MG PO TABS: 20.0000 mg | ORAL_TABLET | Freq: Every day | ORAL | 11 refills | Status: DC

## 2021-02-13 ENCOUNTER — Encounter: Payer: Self-pay | Admitting: Internal Medicine

## 2021-02-13 ENCOUNTER — Non-Acute Institutional Stay (SKILLED_NURSING_FACILITY): Payer: Medicare Other | Admitting: Internal Medicine

## 2021-02-13 DIAGNOSIS — F32A Depression, unspecified: Secondary | ICD-10-CM

## 2021-02-13 DIAGNOSIS — I69391 Dysphagia following cerebral infarction: Secondary | ICD-10-CM

## 2021-02-13 DIAGNOSIS — F320 Major depressive disorder, single episode, mild: Secondary | ICD-10-CM

## 2021-02-13 DIAGNOSIS — I69354 Hemiplegia and hemiparesis following cerebral infarction affecting left non-dominant side: Secondary | ICD-10-CM | POA: Diagnosis not present

## 2021-02-13 DIAGNOSIS — I10 Essential (primary) hypertension: Secondary | ICD-10-CM | POA: Diagnosis not present

## 2021-02-13 DIAGNOSIS — K219 Gastro-esophageal reflux disease without esophagitis: Secondary | ICD-10-CM

## 2021-02-13 DIAGNOSIS — E782 Mixed hyperlipidemia: Secondary | ICD-10-CM

## 2021-02-13 NOTE — Progress Notes (Signed)
Location:  [facility] Room Number: 147 Place of Service:  SNF [phone])  Provider: Veleta Miners MD  Code Status: DNR Goals of Care:  Advanced Directives 02/13/2021  Does Patient Have a Medical Advance Directive? Yes  Type of Paramedic of [place];Living will;Out of facility DNR (pink MOST or yellow form)  Does patient want to make changes to medical advance directive? No - Patient declined  Copy of [place] in Chart? Yes - validated most recent copy scanned in chart (See row information)  Would patient like information on creating a medical advance directive? -  Pre-existing out of facility DNR order (yellow form or pink MOST form) Yellow form placed in chart (order not valid for inpatient use);Pink MOST form placed in chart (order not valid for inpatient use)     Chief Complaint  Patient presents with   Medical Management of Chronic Issues   Health Maintenance    Shingrix, TDAP    HPI: Patient is a 75 y.o. female seen today for medical management of chronic diseases.    Patient has a history of hypertension, hyperlipidemia, depression and anxiety, osteoporosis  s/p hospitalization from 4/20-11/24/19 with new onset of dysarthria, expressive aphasia, confusion and left arm weakness MRI confirmed bilateral CVAs left greater than right Followed By Dysphagia , Dysarthria, Urinary Retention  Has been in SNF now since then She has made good Progress  She is now one assist for transfers. Can Walk with Mild assist and her [place] Rile Per Nurses she is not that motivated and resist walking Has Wheelchair Speech was clear today    Past Medical History:  Diagnosis Date   Anxiety    Aortic atherosclerosis ([place])    Arthritis    Complication of anesthesia    per pt, slow to wake up past sedation.   CVA (cerebral vascular accident) ([place]) 11/10/2019   Depression    Diverticulosis    Hiatal hernia     Hyperplastic colon polyp    Hypertension    Neuropathy    Osteoarthritis    Osteoporosis    Renal mass    Stomach upset    has a senstive stomach   Throat clearing    has a cough at times   Urinary incontinence    Vitamin D deficiency     Past Surgical History:  Procedure Laterality Date   COLONOSCOPY     FACIAL COSMETIC SURGERY     over 15 years ago   FINGER TENDON REPAIR     middle rt hand-   HAMMER TOE SURGERY  12/27/2011   Procedure: HAMMER TOE CORRECTION;  Surgeon: Wylene Simmer, MD;  Location: [facility];  Service: Orthopedics;  Laterality: Left;  2-4th    Allergies  Allergen Reactions   Chlorhexidine     Outpatient Encounter Medications as of 02/13/2021  Medication Sig   acetaminophen (TYLENOL) 325 MG tablet Take 650 mg by mouth at bedtime. And q 6 hrs prn   acetaminophen (TYLENOL) 500 MG tablet Take 1,000 mg by mouth 2 (two) times daily as needed for moderate pain or mild pain.   atorvastatin (LIPITOR) 40 MG tablet Take 1 tablet (40 mg total) by mouth daily.   bisacodyl (BISACODYL) 5 MG EC tablet Take 5 mg by mouth daily as needed for mild constipation or moderate constipation.   bisacodyl (DULCOLAX) 10 MG suppository If not relieved by MOM, give 10 mg Bisacodyl suppositiory rectally X 1 dose in 24 hours  as needed (Do not use constipation standing orders for residents with renal failure/CFR less than 30. Contact MD for orders) (Physician Order)   clopidogrel (PLAVIX) 75 MG tablet Take 1 tablet (75 mg total) by mouth daily.   dicyclomine (BENTYL) 10 MG capsule Take 10 mg by mouth 3 (three) times daily as needed for spasms.   docusate sodium (COLACE) 100 MG capsule Take 100 mg by mouth daily.   escitalopram (LEXAPRO) 20 MG tablet Take 1 tablet (20 mg total) by mouth daily.   magnesium hydroxide (MILK OF MAGNESIA) 400 MG/5ML suspension If no BM in 3 days, give 30 cc Milk of Magnesium p.o. x 1 dose in 24 hours as needed (Do not use standing constipation orders  for residents with renal failure CFR less than 30. Contact MD for orders) (Physician Order)   Melatonin 5 MG CAPS Take 5 mg by mouth at bedtime.   metoprolol tartrate (LOPRESSOR) 25 MG tablet Take 12.5 mg by mouth 2 (two) times daily.   NON FORMULARY Diet: Regular, Nectar Thick, Chopped/Dysphagia 3   Special Instructions: Aspiration precautions. May have bacon per SLP.   pantoprazole (PROTONIX) 20 MG tablet Take 20 mg by mouth daily.   simethicone (MYLICON) 80 MG chewable tablet Chew 1 tablet (80 mg total) by mouth every 8 (eight) hours as needed for flatulence.   Sodium Phosphates (RA SALINE ENEMA RE) If not relieved by Biscodyl suppository, give disposable Saline Enema rectally X 1 dose/24 hrs as needed (Do not use constipation standing orders for residents with renal failure/CFR less than 30. Contact MD for orders)(Physician Or   UNABLE TO FIND Med Name: 1-2-3 Cream; 60 grams; amt: 1 application; topical  Special Instructions: Apply 1-2-3 cream twice daily to buttocks as needed for skin  flare up. Continue to use Desitin cream as a preventative. U   [DISCONTINUED] diclofenac Sodium (VOLTAREN) 1 % GEL Apply 4 g topically 4 (four) times daily. X 2 weeks   No facility-administered encounter medications on file as of 02/13/2021.    Review of Systems:  Review of Systems Review of Systems  Constitutional: Negative for activity change, appetite change, chills, diaphoresis, fatigue and fever.  HENT: Negative for mouth sores, postnasal drip, rhinorrhea, sinus pain and sore throat.   Respiratory: Negative for apnea, cough, chest tightness, shortness of breath and wheezing.   Cardiovascular: Negative for chest pain, palpitations and leg swelling.  Gastrointestinal: Negative for abdominal distention, abdominal pain, constipation, diarrhea, nausea and vomiting.  Genitourinary: Negative for dysuria and frequency.  Musculoskeletal: Negative for arthralgias, joint swelling and myalgias.  Skin: Negative  for rash.  Neurological: Negative for dizziness, syncope, weakness, light-headedness and numbness.  Psychiatric/Behavioral: Negative for behavioral problems, confusion and sleep disturbance.    Health Maintenance  Topic Date Due   Zoster Vaccines- Shingrix (1 of 2) Never done   TETANUS/TDAP  09/20/2020   INFLUENZA VACCINE  02/20/2021   COVID-19 Vaccine  Completed   Hepatitis C Screening  Completed   PNA vac Low Risk Adult  Completed   HPV VACCINES  Aged Out   DEXA SCAN  Discontinued   COLONOSCOPY (Pts 45-82yr Insurance coverage will need to be confirmed)  Discontinued    Physical Exam: Vitals:   02/13/21 1338  BP: 107/72  Pulse: (!) 59  Resp: 18  Temp: (!) 97.5 F (36.4 C)  SpO2: 96%  Weight: 119 lb (54 kg)  Height: '5\' 2"'$  (1.575 m)   Body mass index is 21.77 kg/m. Physical Exam Constitutional:  Well-developed and  well-nourished.  HENT:  Head: Normocephalic.  Mouth/Throat: Oropharynx is clear and moist.  Eyes: Pupils are equal, round, and reactive to light.  Neck: Neck supple.  Cardiovascular: Normal rate and normal heart sounds.  No murmur heard. Pulmonary/Chest: Effort normal and breath sounds normal. No respiratory distress. No wheezes. She has no rales.  Abdominal: Soft. Bowel sounds are normal. No distension. There is no tenderness. There is no rebound.  Musculoskeletal: No edema.  Lymphadenopathy: none Neurological: Has Good strength in LE UE had 3/5 in Left 4/5 in Right UE Skin: Skin is warm and dry.  Psychiatric: Normal mood and affect. Behavior is normal. Thought content normal.   Labs reviewed: Basic Metabolic Panel: Recent Labs    07/04/20 0000 01/24/21 0000  NA 140 141  K 4.0 3.9  CL 109* 106  CO2 23* 24*  BUN 16 19  CREATININE 0.4* 0.6  CALCIUM 9.6 9.7   Liver Function Tests: Recent Labs    01/24/21 0000  AST 14  ALT 24  ALKPHOS 112  ALBUMIN 3.9   No results for input(s): LIPASE, AMYLASE in the last 8760 hours. No results for  input(s): AMMONIA in the last 8760 hours. CBC: Recent Labs    01/24/21 0000  WBC 7.0  HGB 14.2  HCT 43  PLT 236   Lipid Panel: Recent Labs    07/04/20 0000  CHOL 112  HDL 56  LDLCALC 42  TRIG 69   Lab Results  Component Value Date   HGBA1C 5.1 11/11/2019    Procedures since last visit: No results found.  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 [facility]) Working with therapy On Plavix and Statin Will need follow up of her Lipids Primary hypertension On Low dose of metoprolol Dysphagia following cerebrovascular accident (CVA) D 3 Diet Is doing well with her weights Mild depression ([place]) Lexapro recently increased Per Neurology Gastroesophageal reflux disease without esophagitis On Pantoprazole Mixed hyperlipidemia On Statin Osteoporosis Last DEXA done in 12/2017 T score -2.7 Refused treatment with Prolia Cannot do Fosamax due to Dysphagia Continue on Calcium and Vit d For now   Labs/tests ordered:  * No order type specified * Next appt:  Visit date not found

## 2021-02-17 ENCOUNTER — Encounter: Payer: Self-pay | Admitting: Internal Medicine

## 2021-03-07 ENCOUNTER — Non-Acute Institutional Stay (SKILLED_NURSING_FACILITY): Payer: Medicare Other | Admitting: Orthopedic Surgery

## 2021-03-07 ENCOUNTER — Encounter: Payer: Self-pay | Admitting: Orthopedic Surgery

## 2021-03-07 DIAGNOSIS — I10 Essential (primary) hypertension: Secondary | ICD-10-CM | POA: Diagnosis not present

## 2021-03-07 DIAGNOSIS — K5901 Slow transit constipation: Secondary | ICD-10-CM

## 2021-03-07 DIAGNOSIS — I69354 Hemiplegia and hemiparesis following cerebral infarction affecting left non-dominant side: Secondary | ICD-10-CM | POA: Diagnosis not present

## 2021-03-07 DIAGNOSIS — I69391 Dysphagia following cerebral infarction: Secondary | ICD-10-CM | POA: Diagnosis not present

## 2021-03-07 DIAGNOSIS — K219 Gastro-esophageal reflux disease without esophagitis: Secondary | ICD-10-CM

## 2021-03-07 DIAGNOSIS — L539 Erythematous condition, unspecified: Secondary | ICD-10-CM

## 2021-03-07 DIAGNOSIS — F320 Major depressive disorder, single episode, mild: Secondary | ICD-10-CM

## 2021-03-07 DIAGNOSIS — F32A Depression, unspecified: Secondary | ICD-10-CM

## 2021-03-07 NOTE — Progress Notes (Signed)
Location:  [place] Room Number: 147-A Place of Service:  SNF (31) Provider:  Yvonna Alanis, NP   Patient Care Team: [name], MD as PCP - General (Internal Medicine) Wylene Simmer, MD as Consulting Physician (Orthopedic Surgery) Cameron Sprang, MD as Consulting Physician (Neurology)  Extended Emergency Contact Information Primary Emergency Contact: [facility] Address: [address]          [place], [place] [address] Johnnette Litter of [place] Mobile Phone: [phone] Relation: Sister Interpreter needed? No Secondary Emergency Contact: [place],Jim  [place] [employer] Phone: [phone] Relation: Brother  Code Status:  DNR Goals of care: Advanced Directive information Advanced Directives 03/07/2021  Does Patient Have a Medical Advance Directive? Yes  Type of Paramedic of [place];Living will;Out of facility DNR (pink MOST or yellow form)  Does patient want to make changes to medical advance directive? No - Patient declined  Copy of [place] in Chart? Yes - validated most recent copy scanned in chart (See row information)  Would patient like information on creating a medical advance directive? -  Pre-existing out of facility DNR order (yellow form or pink MOST form) Yellow form placed in chart (order not valid for inpatient use);Pink MOST form placed in chart (order not valid for inpatient use)     Chief Complaint  Patient presents with   Medical Management of Chronic Issues    Routine visit and discuss need for shingrix, covid and flu vaccines or exclude    HPI:  Pt is a 75 y.o. female seen today for medical management of chronic diseases.    She currently resides on the skilled nursing unit at [facility]. Past medical history includes: HTN, HLD, left arm weakness, dyarthria, aphasia, confusion, depression/anxiety, OA, and abnormal gait.   Left sided weakness- continues  to work with therapy, lacks motivation to ambulate more, refused therapy a few times in past weeks, one person assist with transfers and ambulating with FWW, in wheelchair today, remains on Plavix and Lipitor, LDL 42 07/04/2020 HTN- BUN/creat 19/0.6 01/24/2021, metoprolol daily Dysphagia- no recent aspirations, remains on DYS 3 diet/nectar thick fluids GERD- denies reflux, Protonix 20 mg daily Depression/anxiety- lexapro 10 mg daily Constipation- LBM 08/14, colace daily, prn bisacodyl and MOM Osteoporosis- DEXA 2019/ T score -2.7, refused Prolia, prolonged use of PPI, remains on calcium and vit d supplement.  Redness of right great toe- noticed 08/14, she denies pain, followed by wound care  No recent falls, injuries or behavioral outbursts.   Recent blood pressures:  08/10- 109/76  08/03- 101/74  07/27-101/67  Recent weights:  08/01- 121.8 lbs  06/01- 119 lbs  Nurse does not report any concerns, vitals stable.   Past Medical History:  Diagnosis Date   Anxiety    Aortic atherosclerosis (HCC)    Arthritis    Complication of anesthesia    per pt, slow to wake up past sedation.   CVA (cerebral vascular accident) ([place]) 11/10/2019   Depression    Diverticulosis    Hiatal hernia    Hyperplastic colon polyp    Hypertension    Neuropathy    Osteoarthritis    Osteoporosis    Renal mass    Stomach upset    has a senstive stomach   Throat clearing    has a cough at times   Urinary incontinence    Vitamin D deficiency    Past Surgical History:  Procedure Laterality Date   COLONOSCOPY  FACIAL COSMETIC SURGERY     over 15 years ago   FINGER TENDON REPAIR     middle rt hand-   HAMMER TOE SURGERY  12/27/2011   Procedure: HAMMER TOE CORRECTION;  Surgeon: Wylene Simmer, MD;  Location: [facility];  Service: Orthopedics;  Laterality: Left;  2-4th    Allergies  Allergen Reactions   Chlorhexidine     Outpatient Encounter Medications as of 03/07/2021  Medication  Sig   acetaminophen (TYLENOL) 325 MG tablet Take 650 mg by mouth at bedtime. And q 6 hrs prn   acetaminophen (TYLENOL) 500 MG tablet Take 1,000 mg by mouth 2 (two) times daily as needed for moderate pain or mild pain.   atorvastatin (LIPITOR) 40 MG tablet Take 1 tablet (40 mg total) by mouth daily.   bisacodyl (BISACODYL) 5 MG EC tablet Take 5 mg by mouth daily as needed for mild constipation or moderate constipation.   bisacodyl (DULCOLAX) 10 MG suppository If not relieved by MOM, give 10 mg Bisacodyl suppositiory rectally X 1 dose in 24 hours as needed (Do not use constipation standing orders for residents with renal failure/CFR less than 30. Contact MD for orders) (Physician Order)   clopidogrel (PLAVIX) 75 MG tablet Take 1 tablet (75 mg total) by mouth daily.   dicyclomine (BENTYL) 10 MG capsule Take 10 mg by mouth 3 (three) times daily as needed for spasms.   docusate sodium (COLACE) 100 MG capsule Take 100 mg by mouth daily.   escitalopram (LEXAPRO) 10 MG tablet Take 20 mg by mouth daily. In the evening between 4-7 pm   magnesium hydroxide (MILK OF MAGNESIA) 400 MG/5ML suspension If no BM in 3 days, give 30 cc Milk of Magnesium p.o. x 1 dose in 24 hours as needed (Do not use standing constipation orders for residents with renal failure CFR less than 30. Contact MD for orders) (Physician Order)   Melatonin 5 MG CAPS Take 5 mg by mouth at bedtime.   metoprolol tartrate (LOPRESSOR) 25 MG tablet Take 12.5 mg by mouth 2 (two) times daily.   NON FORMULARY Diet: Regular, Nectar Thick, Chopped/Dysphagia 3   Special Instructions: Aspiration precautions. May have bacon per SLP.   pantoprazole (PROTONIX) 20 MG tablet Take 20 mg by mouth daily.   simethicone (MYLICON) 80 MG chewable tablet Chew 1 tablet (80 mg total) by mouth every 8 (eight) hours as needed for flatulence.   Sodium Phosphates (RA SALINE ENEMA RE) If not relieved by Biscodyl suppository, give disposable Saline Enema rectally X 1 dose/24  hrs as needed (Do not use constipation standing orders for residents with renal failure/CFR less than 30. Contact MD for orders)(Physician Or   UNABLE TO FIND Med Name: 1-2-3 Cream; 60 grams; amt: 1 application; topical  Special Instructions: Apply 1-2-3 cream twice daily to buttocks as needed for skin  flare up. Continue to use Desitin cream as a preventative. U   [DISCONTINUED] escitalopram (LEXAPRO) 20 MG tablet Take 1 tablet (20 mg total) by mouth daily. (Patient not taking: Reported on 03/07/2021)   No facility-administered encounter medications on file as of 03/07/2021.    Review of Systems  Constitutional:  Negative for activity change, appetite change, fatigue and fever.  HENT:  Positive for trouble swallowing. Negative for dental problem and hearing loss.        Food pockets in teeth  Eyes:  Negative for visual disturbance.  Respiratory:  Negative for cough, shortness of breath and wheezing.   Cardiovascular:  Negative for chest pain and leg swelling.  Gastrointestinal:  Positive for constipation. Negative for abdominal distention, abdominal pain, diarrhea and nausea.  Genitourinary:  Negative for dysuria, frequency and hematuria.  Musculoskeletal:  Positive for arthralgias, gait problem and myalgias.  Skin: Negative.   Neurological:  Positive for weakness. Negative for dizziness and headaches.  Hematological:  Bruises/bleeds easily.  Psychiatric/Behavioral:  Negative for dysphoric mood and sleep disturbance. The patient is not nervous/anxious.    Immunization History  Administered Date(s) Administered   Fluad Quad(high Dose 65+) 04/27/2019   Influenza, High Dose Seasonal PF 05/03/2017, 05/19/2018   Influenza,inj,Quad PF,6+ Mos 04/29/2013, 05/18/2014, 05/02/2015, 04/17/2016   Influenza-Unspecified 05/13/2020   Moderna SARS-COV2 Booster Vaccination 12/25/2020   PFIZER(Purple Top)SARS-COV-2 Vaccination 08/27/2019, 09/21/2019, 06/13/2020   Pneumococcal Conjugate-13 05/11/2013    Pneumococcal Polysaccharide-23 05/02/2015   Pertinent  Health Maintenance Due  Topic Date Due   INFLUENZA VACCINE  02/20/2021   PNA vac Low Risk Adult  Completed   DEXA SCAN  Discontinued   COLONOSCOPY (Pts 45-22yr Insurance coverage will need to be confirmed)  Discontinued   Fall Risk  09/26/2020 12/07/2019 04/27/2019 08/05/2018 06/17/2018  Falls in the past year? 0 0 1 0 0  Number falls in past yr: 0 0 0 - 0  Injury with Fall? 0 0 1 - 0   Functional Status Survey:    Vitals:   03/07/21 1141  BP: 109/76  Pulse: 60  Resp: 18  Temp: (!) 97.2 F (36.2 C)  SpO2: 93%  Weight: 121 lb 12.8 oz (55.2 kg)  Height: '5\' 2"'$  (1.575 m)   Body mass index is 22.28 kg/m. Physical Exam Vitals reviewed.  Constitutional:      General: She is not in acute distress. HENT:     Head: Normocephalic.     Right Ear: There is no impacted cerumen.     Left Ear: There is no impacted cerumen.     Nose: Nose normal.     Mouth/Throat:     Mouth: Mucous membranes are moist.  Eyes:     General:        Right eye: No discharge.        Left eye: No discharge.  Neck:     Vascular: No carotid bruit.  Cardiovascular:     Rate and Rhythm: Normal rate and regular rhythm.     Pulses: Normal pulses.     Heart sounds: Normal heart sounds. No murmur heard. Pulmonary:     Effort: Pulmonary effort is normal. No respiratory distress.     Breath sounds: Normal breath sounds. No wheezing.  Abdominal:     General: Bowel sounds are normal. There is no distension.     Palpations: Abdomen is soft.     Tenderness: There is no abdominal tenderness.  Musculoskeletal:     Cervical back: Normal range of motion.     Right lower leg: No edema.     Left lower leg: No edema.  Lymphadenopathy:     Cervical: No cervical adenopathy.  Skin:    General: Skin is warm and dry.     Capillary Refill: Capillary refill takes less than 2 seconds.     Comments: Right great toe with no skin breakdown, blanchable. She denies pain  during exam.   Neurological:     General: No focal deficit present.     Mental Status: She is alert and oriented to person, place, and time.     Motor: Weakness present.  Gait: Gait abnormal.     Comments: Wheelchair/walker, right hand grip 4/5, left hand grip 3/5  Psychiatric:        Mood and Affect: Mood normal.        Behavior: Behavior normal.    Labs reviewed: Recent Labs    07/04/20 0000 01/24/21 0000  NA 140 141  K 4.0 3.9  CL 109* 106  CO2 23* 24*  BUN 16 19  CREATININE 0.4* 0.6  CALCIUM 9.6 9.7   Recent Labs    01/24/21 0000  AST 14  ALT 24  ALKPHOS 112  ALBUMIN 3.9   Recent Labs    01/24/21 0000  WBC 7.0  HGB 14.2  HCT 43  PLT 236   Lab Results  Component Value Date   TSH 1.728 11/10/2019   Lab Results  Component Value Date   HGBA1C 5.1 11/11/2019   Lab Results  Component Value Date   CHOL 112 07/04/2020   HDL 56 07/04/2020   LDLCALC 42 07/04/2020   LDLDIRECT 116.0 04/27/2019   TRIG 69 07/04/2020   CHOLHDL 3.0 11/11/2019    Significant Diagnostic Results in last 30 days:  No results found.  Assessment/Plan 1. 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 Primary hypertension - controlled - cont metoprolol 12.5 mg bid  3. Dysphagia following cerebrovascular accident (CVA) - no recent aspirations - cont DYS 3 diet and nectar fluids  4. Gastroesophageal reflux disease without esophagitis - no recent reflux - cont Protonix 20 mg daily  5. Mild depression (HCC) - Lexapro increased to 10 mg daily by Neuro  6. Slow transit constipation - LBM 08/14 - cont colace daily - cont bisacodyl and MOM prn - encourage fluids  7. Erythema of toe - right great toe blanchable, no skin  breakdown    Family/ staff Communication: plan discussed with patient and nurse  Labs/tests ordered:  none

## 2021-04-04 ENCOUNTER — Encounter: Payer: Self-pay | Admitting: Orthopedic Surgery

## 2021-04-04 ENCOUNTER — Non-Acute Institutional Stay (SKILLED_NURSING_FACILITY): Payer: Medicare Other | Admitting: Orthopedic Surgery

## 2021-04-04 DIAGNOSIS — I69391 Dysphagia following cerebral infarction: Secondary | ICD-10-CM

## 2021-04-04 DIAGNOSIS — L602 Onychogryphosis: Secondary | ICD-10-CM

## 2021-04-04 DIAGNOSIS — I10 Essential (primary) hypertension: Secondary | ICD-10-CM

## 2021-04-04 DIAGNOSIS — K219 Gastro-esophageal reflux disease without esophagitis: Secondary | ICD-10-CM

## 2021-04-04 DIAGNOSIS — F32A Depression, unspecified: Secondary | ICD-10-CM

## 2021-04-04 DIAGNOSIS — I69354 Hemiplegia and hemiparesis following cerebral infarction affecting left non-dominant side: Secondary | ICD-10-CM

## 2021-04-04 DIAGNOSIS — F320 Major depressive disorder, single episode, mild: Secondary | ICD-10-CM

## 2021-04-04 DIAGNOSIS — K5901 Slow transit constipation: Secondary | ICD-10-CM

## 2021-04-04 DIAGNOSIS — M810 Age-related osteoporosis without current pathological fracture: Secondary | ICD-10-CM

## 2021-04-04 NOTE — Progress Notes (Signed)
Location:  [facility] Room Number: 147-A Place of Service:  SNF (31) Provider:  Yvonna Alanis, NP   Patient Care Team: [name], MD as PCP - General (Internal Medicine) Wylene Simmer, MD as Consulting Physician (Orthopedic Surgery) Cameron Sprang, MD as Consulting Physician (Neurology)  Extended Emergency Contact Information Primary Emergency Contact: [facility] Address: [address]          [place], [place] [address] Johnnette Litter of [place] Mobile Phone: [phone] Relation: Sister Interpreter needed? No Secondary Emergency Contact: [place],Jim  [place] [employer] Phone: [phone] Relation: Brother  Code Status:  DNR Goals of care: Advanced Directive information Advanced Directives 04/04/2021  Does Patient Have a Medical Advance Directive? Yes  Type of Paramedic of [place];Living will;Out of facility DNR (pink MOST or yellow form)  Does patient want to make changes to medical advance directive? No - Patient declined  Copy of [place] in Chart? Yes - validated most recent copy scanned in chart (See row information)  Would patient like information on creating a medical advance directive? -  Pre-existing out of facility DNR order (yellow form or pink MOST form) Yellow form placed in chart (order not valid for inpatient use);Pink MOST form placed in chart (order not valid for inpatient use)     Chief Complaint  Patient presents with   Medical Management of Chronic Issues    Routine visit. Discuss need for flu vaccine or exclude.     HPI:  Pt is a 75 y.o. female seen today for medical management of chronic diseases.    She currently resides on the skilled nursing unit at [facility]. Past medical history includes: HTN, HLD, left arm weakness, dyarthria, aphasia, confusion, depression/anxiety, OA, and abnormal gait.   Left sided weakness- continues to work with OT, she  has a yellow license with [place], lacks motivation at times to work with therapy, refused therapy a few times in past weeks, one person assist with transfers and ambulating with FWW, in wheelchair today, remains on Plavix and Lipitor, LDL 42 07/04/2020 HTN- BUN/creat 19/0.6 01/24/2021, metoprolol daily Dysphagia- no recent aspirations, remains on DYS 3 diet/nectar thick fluids, reports dislike to foods served at [facility], upset it is not regular foods GERD- denies reflux, Protonix 20 mg daily Depression/anxiety- lexapro 10 mg daily Constipation- LBM 09/12, colace daily, prn bisacodyl and MOM Osteoporosis- DEXA 2019/ T score -2.7, refused Prolia, remains on PPI, continues to take calcium and vit d supplement.  Onychauxis- seen by podiatry today, redness to right great toe improved from last month  No recent falls or injuries.   Recent blood pressures:  08/31- 128/71  08/17- 123/78   Recent weights:  09/01- 123.2 lbs  08/01- 121.8 lbs  07/01- 121 lbs   06/01- 119 lbs  Nurse does not report any concerns, vitals stable.    Past Medical History:  Diagnosis Date   Anxiety    Aortic atherosclerosis (HCC)    Arthritis    Complication of anesthesia    per pt, slow to wake up past sedation.   CVA (cerebral vascular accident) ([place]) 11/10/2019   Depression    Diverticulosis    Hiatal hernia    Hyperplastic colon polyp    Hypertension    Neuropathy    Osteoarthritis    Osteoporosis    Renal mass    Stomach upset    has a senstive stomach   Throat clearing    has a  cough at times   Urinary incontinence    Vitamin D deficiency    Past Surgical History:  Procedure Laterality Date   COLONOSCOPY     FACIAL COSMETIC SURGERY     over 15 years ago   FINGER TENDON REPAIR     middle rt hand-   HAMMER TOE SURGERY  12/27/2011   Procedure: HAMMER TOE CORRECTION;  Surgeon: Wylene Simmer, MD;  Location: [facility];  Service: Orthopedics;  Laterality: Left;  2-4th    Allergies   Allergen Reactions   Chlorhexidine     Outpatient Encounter Medications as of 04/04/2021  Medication Sig   acetaminophen (TYLENOL) 325 MG tablet Take 650 mg by mouth at bedtime. And q 6 hrs prn   acetaminophen (TYLENOL) 500 MG tablet Take 1,000 mg by mouth 2 (two) times daily as needed for moderate pain or mild pain.   atorvastatin (LIPITOR) 40 MG tablet Take 1 tablet (40 mg total) by mouth daily.   bisacodyl (BISACODYL) 5 MG EC tablet Take 5 mg by mouth daily as needed for mild constipation or moderate constipation.   bisacodyl (DULCOLAX) 10 MG suppository If not relieved by MOM, give 10 mg Bisacodyl suppositiory rectally X 1 dose in 24 hours as needed (Do not use constipation standing orders for residents with renal failure/CFR less than 30. Contact MD for orders) (Physician Order)   clopidogrel (PLAVIX) 75 MG tablet Take 1 tablet (75 mg total) by mouth daily.   dicyclomine (BENTYL) 10 MG capsule Take 10 mg by mouth 3 (three) times daily as needed for spasms.   docusate sodium (COLACE) 100 MG capsule Take 100 mg by mouth daily.   escitalopram (LEXAPRO) 10 MG tablet Take 20 mg by mouth daily. In the evening between 4-7 pm   magnesium hydroxide (MILK OF MAGNESIA) 400 MG/5ML suspension If no BM in 3 days, give 30 cc Milk of Magnesium p.o. x 1 dose in 24 hours as needed (Do not use standing constipation orders for residents with renal failure CFR less than 30. Contact MD for orders) (Physician Order)   Melatonin 5 MG CAPS Take 5 mg by mouth at bedtime.   metoprolol tartrate (LOPRESSOR) 25 MG tablet Take 12.5 mg by mouth 2 (two) times daily.   NON FORMULARY Diet: Regular, Chopped/Dysphagia 3   Special Instructions: Aspiration precautions. May have bacon per SLP.   Nutritional Supplements (BOOST PLUS PO) Take 1 Can by mouth daily as needed (Mid morning, Vanilla).   pantoprazole (PROTONIX) 20 MG tablet Take 20 mg by mouth daily.   simethicone (MYLICON) 80 MG chewable tablet Chew 1 tablet (80 mg  total) by mouth every 8 (eight) hours as needed for flatulence.   Sodium Phosphates (RA SALINE ENEMA RE) If not relieved by Biscodyl suppository, give disposable Saline Enema rectally X 1 dose/24 hrs as needed (Do not use constipation standing orders for residents with renal failure/CFR less than 30. Contact MD for orders)(Physician Or   UNABLE TO FIND Med Name: 1-2-3 Cream; 60 grams; amt: 1 application; topical  Special Instructions: Apply 1-2-3 cream twice daily to buttocks as needed for skin  flare up. Continue to use Desitin cream as a preventative. U   No facility-administered encounter medications on file as of 04/04/2021.    Review of Systems  Constitutional:  Negative for activity change, appetite change, fatigue and fever.  HENT:  Positive for trouble swallowing. Negative for congestion and hearing loss.   Eyes:  Negative for visual disturbance.  Respiratory:  Negative for cough, shortness of breath and wheezing.   Cardiovascular:  Negative for chest pain and leg swelling.  Gastrointestinal:  Positive for constipation. Negative for abdominal distention, abdominal pain, blood in stool, diarrhea and nausea.  Genitourinary:  Negative for dysuria, frequency and hematuria.  Musculoskeletal:  Positive for arthralgias, gait problem and myalgias.  Skin: Negative.   Neurological:  Positive for weakness. Negative for dizziness, light-headedness and headaches.  Hematological:  Bruises/bleeds easily.  Psychiatric/Behavioral:  Positive for dysphoric mood. Negative for sleep disturbance. The patient is nervous/anxious.    Immunization History  Administered Date(s) Administered   Fluad Quad(high Dose 65+) 04/27/2019   Influenza, High Dose Seasonal PF 05/03/2017, 05/19/2018   Influenza,inj,Quad PF,6+ Mos 04/29/2013, 05/18/2014, 05/02/2015, 04/17/2016   Influenza-Unspecified 05/13/2020   Moderna SARS-COV2 Booster Vaccination 12/25/2020   PFIZER(Purple Top)SARS-COV-2 Vaccination 08/27/2019,  09/21/2019, 06/13/2020   Pneumococcal Conjugate-13 05/11/2013   Pneumococcal Polysaccharide-23 05/02/2015   Tdap 01/23/2021   Zoster Recombinat (Shingrix) 01/20/2021, 03/24/2021   Zoster, Unspecified 01/20/2021   Pertinent  Health Maintenance Due  Topic Date Due   INFLUENZA VACCINE  02/20/2021   PNA vac Low Risk Adult  Completed   DEXA SCAN  Discontinued   COLONOSCOPY (Pts 45-27yr Insurance coverage will need to be confirmed)  Discontinued   Fall Risk  09/26/2020 12/07/2019 04/27/2019 08/05/2018 06/17/2018  Falls in the past year? 0 0 1 0 0  Number falls in past yr: 0 0 0 - 0  Injury with Fall? 0 0 1 - 0   Functional Status Survey:    Vitals:   04/04/21 0940  BP: 128/71  Pulse: (!) 57  Resp: 20  Temp: (!) 97 F (36.1 C)  SpO2: 97%  Weight: 121 lb 12.8 oz (55.2 kg)  Height: '5\' 2"'$  (1.575 m)   Body mass index is 22.28 kg/m. Physical Exam Vitals reviewed.  Constitutional:      General: She is not in acute distress. HENT:     Head: Normocephalic.     Right Ear: There is no impacted cerumen.     Left Ear: There is no impacted cerumen.     Nose: Nose normal.     Mouth/Throat:     Mouth: Mucous membranes are moist.  Eyes:     General:        Right eye: No discharge.        Left eye: No discharge.  Neck:     Vascular: No carotid bruit.  Cardiovascular:     Rate and Rhythm: Normal rate and regular rhythm.     Pulses: Normal pulses.     Heart sounds: Normal heart sounds. No murmur heard. Pulmonary:     Effort: Pulmonary effort is normal. No respiratory distress.     Breath sounds: Normal breath sounds. No wheezing.  Abdominal:     General: Bowel sounds are normal. There is no distension.     Palpations: Abdomen is soft.     Tenderness: There is no abdominal tenderness.  Musculoskeletal:     Cervical back: Normal range of motion.     Right lower leg: No edema.     Left lower leg: No edema.  Lymphadenopathy:     Cervical: No cervical adenopathy.  Skin:    General:  Skin is warm and dry.     Capillary Refill: Capillary refill takes less than 2 seconds.  Neurological:     General: No focal deficit present.     Mental Status: She is alert. Mental status is at  baseline.     Motor: Weakness present.     Gait: Gait abnormal.     Comments: Wheelchair/PWC, right hand grip 4/5, left hand grip 3/5.   Psychiatric:        Mood and Affect: Mood normal. Affect is flat.        Behavior: Behavior normal.    Labs reviewed: Recent Labs    07/04/20 0000 01/24/21 0000  NA 140 141  K 4.0 3.9  CL 109* 106  CO2 23* 24*  BUN 16 19  CREATININE 0.4* 0.6  CALCIUM 9.6 9.7   Recent Labs    01/24/21 0000  AST 14  ALT 24  ALKPHOS 112  ALBUMIN 3.9   Recent Labs    01/24/21 0000  WBC 7.0  HGB 14.2  HCT 43  PLT 236   Lab Results  Component Value Date   TSH 1.728 11/10/2019   Lab Results  Component Value Date   HGBA1C 5.1 11/11/2019   Lab Results  Component Value Date   CHOL 112 07/04/2020   HDL 56 07/04/2020   LDLCALC 42 07/04/2020   LDLDIRECT 116.0 04/27/2019   TRIG 69 07/04/2020   CHOLHDL 3.0 11/11/2019    Significant Diagnostic Results in last 30 days:  No results found.  Assessment/Plan 1. Onychauxis - seen by podiatry today, toenails trimmed - redness to right great toe resolved - cont podiatry visit every 3 months   2. 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 Primary hypertension - controlled  - cont metoprolol 12.5 mg daily  4. Dysphagia following cerebrovascular accident (CVA) - no recent aspirations - maintaining weight within past month - cont DYS 3 diet and nectar fluids  5. Gastroesophageal reflux disease without esophagitis - no recent reflux - cont Protonix 20 mg daily   6. Mild depression (HCC) - cont lexapro 10 mg daily -  continues to have flat affect  7. Slow transit constipation - LBM 09/12 - cont colace, bisacodyl and MOM prn  - encourage hydration  8. Age-related osteoporosis without current pathological fracture - DEXA 2019/ T score -2.7 - refused Prolia,  - chronic use of PPI - continues to take calcium and vit d supplement.     Family/ staff Communication: plan discussed with patient and nurse  Labs/tests ordered:  none

## 2021-04-24 ENCOUNTER — Non-Acute Institutional Stay (SKILLED_NURSING_FACILITY): Payer: Medicare Other | Admitting: Internal Medicine

## 2021-04-24 ENCOUNTER — Encounter: Payer: Self-pay | Admitting: Internal Medicine

## 2021-04-24 DIAGNOSIS — F32A Depression, unspecified: Secondary | ICD-10-CM | POA: Diagnosis not present

## 2021-04-24 DIAGNOSIS — I69391 Dysphagia following cerebral infarction: Secondary | ICD-10-CM

## 2021-04-24 DIAGNOSIS — I10 Essential (primary) hypertension: Secondary | ICD-10-CM | POA: Diagnosis not present

## 2021-04-24 DIAGNOSIS — K219 Gastro-esophageal reflux disease without esophagitis: Secondary | ICD-10-CM | POA: Diagnosis not present

## 2021-04-24 DIAGNOSIS — K5901 Slow transit constipation: Secondary | ICD-10-CM

## 2021-04-24 DIAGNOSIS — M810 Age-related osteoporosis without current pathological fracture: Secondary | ICD-10-CM

## 2021-04-24 DIAGNOSIS — Z8673 Personal history of transient ischemic attack (TIA), and cerebral infarction without residual deficits: Secondary | ICD-10-CM

## 2021-04-24 NOTE — Progress Notes (Signed)
Location:   [facility] Place of Service:  Clinic [phone]) Provider:  Veleta Miners MD   [name], MD  Patient Care Team: [name], MD as PCP - General (Internal Medicine) Wylene Simmer, MD as Consulting Physician (Orthopedic Surgery) Cameron Sprang, MD as Consulting Physician (Neurology)  Extended Emergency Contact Information Primary Emergency Contact: [facility] Address: [address]          [place], [place] [address] Johnnette Litter of [place] Mobile Phone: [phone] Relation: Sister Interpreter needed? No Secondary Emergency Contact: Girdler,Jim  [place] Mobile Phone: [phone] Relation: Brother  Code Status:  DNR Goals of care: Advanced Directive information Advanced Directives 04/24/2021  Does Patient Have a Medical Advance Directive? Yes  Type of Paramedic of [place];Living will;Out of facility DNR (pink MOST or yellow form)  Does patient want to make changes to medical advance directive? No - Patient declined  Copy of [place] in Chart? Yes - validated most recent copy scanned in chart (See row information)  Would patient like information on creating a medical advance directive? -  Pre-existing out of facility DNR order (yellow form or pink MOST form) Yellow form placed in chart (order not valid for inpatient use);Pink MOST form placed in chart (order not valid for inpatient use)     Chief Complaint  Patient presents with   Medical Management of Chronic Issues   Quality Metric Gaps    Flu shot    HPI:  Pt is a 75 y.o. female seen today for medical management of chronic diseases.    Patient has a history of hypertension, hyperlipidemia, depression and anxiety, osteoporosis  s/p hospitalization from 4/20-11/24/19 with new onset of dysarthria, expressive aphasia, confusion and left arm weakness MRI confirmed bilateral CVAs left greater than right Followed By  Dysphagia , Dysarthria, Urinary Retention  She is long term resident in SNF She has gained weight.  Doing well Her only complain was of Constipation. And to strain to go to the bathroom. Walks short distance with Assist. On Wheelchair most of the time Uses power chair for long distance On D 3 Diet. With Thin liquids Mood seems good No New Nursing issues  Past Medical History:  Diagnosis Date   Anxiety    Aortic atherosclerosis (HCC)    Arthritis    Complication of anesthesia    per pt, slow to wake up past sedation.   CVA (cerebral vascular accident) ([facility]) 11/10/2019   Depression    Diverticulosis    Hiatal hernia    Hyperplastic colon polyp    Hypertension    Neuropathy    Osteoarthritis    Osteoporosis    Renal mass    Stomach upset    has a senstive stomach   Throat clearing    has a cough at times   Urinary incontinence    Vitamin D deficiency    Past Surgical History:  Procedure Laterality Date   COLONOSCOPY     FACIAL COSMETIC SURGERY     over 15 years ago   FINGER TENDON REPAIR     middle rt hand-   HAMMER TOE SURGERY  12/27/2011   Procedure: HAMMER TOE CORRECTION;  Surgeon: Wylene Simmer, MD;  Location: [facility];  Service: Orthopedics;  Laterality: Left;  2-4th    Allergies  Allergen Reactions   Chlorhexidine     Allergies as of 04/24/2021       Reactions  Chlorhexidine         Medication List        Accurate as of April 24, 2021  1:37 PM. If you have any questions, ask your nurse or doctor.          acetaminophen 325 MG tablet Commonly known as: TYLENOL Take 650 mg by mouth at bedtime. And q 6 hrs prn   acetaminophen 500 MG tablet Commonly known as: TYLENOL Take 1,000 mg by mouth 2 (two) times daily as needed for moderate pain or mild pain.   atorvastatin 40 MG tablet Commonly known as: LIPITOR Take 1 tablet (40 mg total) by mouth daily.   bisacodyl 5 MG EC tablet Generic drug: bisacodyl Take 5 mg by mouth daily  as needed for mild constipation or moderate constipation.   bisacodyl 10 MG suppository Commonly known as: DULCOLAX If not relieved by MOM, give 10 mg Bisacodyl suppositiory rectally X 1 dose in 24 hours as needed (Do not use constipation standing orders for residents with renal failure/CFR less than 30. Contact MD for orders) (Physician Order)   BOOST PLUS PO Take 1 Can by mouth daily as needed (Mid morning, Vanilla).   clopidogrel 75 MG tablet Commonly known as: PLAVIX Take 1 tablet (75 mg total) by mouth daily.   cycloSPORINE 0.05 % ophthalmic emulsion Commonly known as: RESTASIS Place 1 drop into both eyes 2 (two) times daily.   dicyclomine 10 MG capsule Commonly known as: BENTYL Take 10 mg by mouth 3 (three) times daily as needed for spasms.   docusate sodium 100 MG capsule Commonly known as: COLACE Take 100 mg by mouth daily.   escitalopram 10 MG tablet Commonly known as: LEXAPRO Take 20 mg by mouth daily. In the evening between 4-7 pm   magnesium hydroxide 400 MG/5ML suspension Commonly known as: MILK OF MAGNESIA If no BM in 3 days, give 30 cc Milk of Magnesium p.o. x 1 dose in 24 hours as needed (Do not use standing constipation orders for residents with renal failure CFR less than 30. Contact MD for orders) (Physician Order)   Melatonin 5 MG Caps Take 5 mg by mouth at bedtime.   metoprolol tartrate 25 MG tablet Commonly known as: LOPRESSOR Take 12.5 mg by mouth 2 (two) times daily.   NON FORMULARY Diet: Regular, Chopped/Dysphagia 3   Special Instructions: Aspiration precautions. May have bacon per SLP.   pantoprazole 20 MG tablet Commonly known as: PROTONIX Take 20 mg by mouth daily.   RA SALINE ENEMA RE If not relieved by Biscodyl suppository, give disposable Saline Enema rectally X 1 dose/24 hrs as needed (Do not use constipation standing orders for residents with renal failure/CFR less than 30. Contact MD for orders)(Physician Or   simethicone 80 MG  chewable tablet Commonly known as: MYLICON Chew 1 tablet (80 mg total) by mouth every 8 (eight) hours as needed for flatulence.   UNABLE TO FIND Med Name: 1-2-3 Cream; 60 grams; amt: 1 application; topical  Special Instructions: Apply 1-2-3 cream twice daily to buttocks as needed for skin  flare up. Continue to use Desitin cream as a preventative. U        Review of Systems  Constitutional: Negative.   HENT: Negative.    Respiratory: Negative.    Cardiovascular: Negative.   Gastrointestinal:  Positive for constipation.  Genitourinary: Negative.   Musculoskeletal:  Positive for gait problem.  Skin: Negative.   Neurological:  Positive for weakness.  Psychiatric/Behavioral: Negative.  Immunization History  Administered Date(s) Administered   Fluad Quad(high Dose 65+) 04/27/2019   Influenza, High Dose Seasonal PF 05/03/2017, 05/19/2018   Influenza,inj,Quad PF,6+ Mos 04/29/2013, 05/18/2014, 05/02/2015, 04/17/2016   Influenza-Unspecified 05/13/2020   Moderna SARS-COV2 Booster Vaccination 12/25/2020   PFIZER(Purple Top)SARS-COV-2 Vaccination 08/27/2019, 09/21/2019, 06/13/2020   Pneumococcal Conjugate-13 05/11/2013   Pneumococcal Polysaccharide-23 05/02/2015   Tdap 01/23/2021   Zoster Recombinat (Shingrix) 01/20/2021, 03/24/2021   Zoster, Unspecified 01/20/2021   Pertinent  Health Maintenance Due  Topic Date Due   INFLUENZA VACCINE  02/20/2021   DEXA SCAN  Discontinued   COLONOSCOPY (Pts 45-76yrs Insurance coverage will need to be confirmed)  Discontinued   Fall Risk  09/26/2020 12/07/2019 04/27/2019 08/05/2018 06/17/2018  Falls in the past year? 0 0 1 0 0  Number falls in past yr: 0 0 0 - 0  Injury with Fall? 0 0 1 - 0   Functional Status Survey:    Vitals:   04/24/21 1315  BP: 122/80  Pulse: 62  Resp: (!) 22  Temp: (!) 97.2 F (36.2 C)  SpO2: 92%  Weight: 126 lb (57.2 kg)  Height: 5\' 2"  (1.575 m)   Body mass index is 23.05 kg/m. Physical Exam Vitals  reviewed.  Constitutional:      Appearance: Normal appearance.  HENT:     Head: Normocephalic.     Nose: Nose normal.     Mouth/Throat:     Mouth: Mucous membranes are moist.     Pharynx: Oropharynx is clear.  Cardiovascular:     Rate and Rhythm: Normal rate and regular rhythm.     Pulses: Normal pulses.  Pulmonary:     Effort: Pulmonary effort is normal.     Breath sounds: Normal breath sounds.  Abdominal:     General: Abdomen is flat. Bowel sounds are normal.     Palpations: Abdomen is soft.  Musculoskeletal:        General: No swelling.     Cervical back: Neck supple.  Skin:    General: Skin is warm.  Neurological:     Mental Status: She is alert.     Comments:  Has Good strength in LE UE had 3/5 in Left 4/5 in Right UE  Psychiatric:        Mood and Affect: Mood normal.        Thought Content: Thought content normal.    Labs reviewed: Recent Labs    07/04/20 0000 01/24/21 0000  NA 140 141  K 4.0 3.9  CL 109* 106  CO2 23* 24*  BUN 16 19  CREATININE 0.4* 0.6  CALCIUM 9.6 9.7   Recent Labs    01/24/21 0000  AST 14  ALT 24  ALKPHOS 112  ALBUMIN 3.9   Recent Labs    01/24/21 0000  WBC 7.0  HGB 14.2  HCT 43  PLT 236   Lab Results  Component Value Date   TSH 1.728 11/10/2019   Lab Results  Component Value Date   HGBA1C 5.1 11/11/2019   Lab Results  Component Value Date   CHOL 112 07/04/2020   HDL 56 07/04/2020   LDLCALC 42 07/04/2020   LDLDIRECT 116.0 04/27/2019   TRIG 69 07/04/2020   CHOLHDL 3.0 11/11/2019    Significant Diagnostic Results in last 30 days:  No results found.  Assessment/Plan Primary hypertension On Low dose of Lopressor  H/O: CVA (cerebrovascular accident) On Plavix and statin Dependent for her ADLS Uses Wheelchair and Mild to Mod assist to  walk  Dysphagia following cerebrovascular accident (CVA) On D3 with thin liquids Weight is good  Gastroesophageal reflux disease without esophagitis On Protonix  Mild  depression Continue Lexapro  Slow transit constipation Will start her on Senna if ok with POA  HLD On Statin Last LDL 42 in 12/21  Age-related osteoporosis without current pathological fracture Last DEXA done in 12/2017 T score -2.7 Refused treatment with Prolia Cannot do Fosamax due to Dysphagia Continue on Calcium and Vit d For now  Family/ staff Communication:   Labs/tests ordered:

## 2021-05-15 IMAGING — MR MR HEAD W/O CM
12 of 14 series · 37 of 48 positions shown · non-contrast
Comparison: Head CT 11/10/2019 and MRI 02/28/2017

CLINICAL DATA: Generalized weakness. Encephalopathy.

EXAM:
MRI HEAD WITHOUT CONTRAST
TECHNIQUE: Multiplanar, multiecho pulse sequences of the brain and surrounding
structures were obtained without intravenous contrast.

[Series 9: DWI · axial · 3.0mm · 0.88mm/px · z∈[-64,+81]mm · 6 of 100 slices shown (1 of 4)]
[im 1/100]
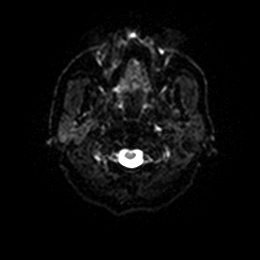
[im 20/100]
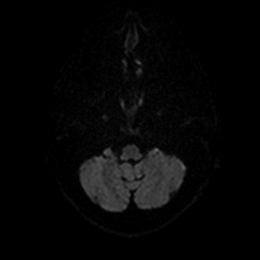
[im 40/100]
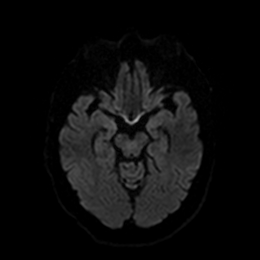
[im 60/100]
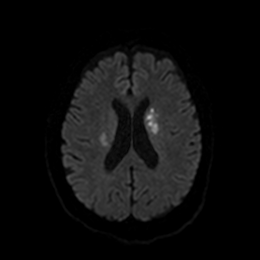
[im 80/100]
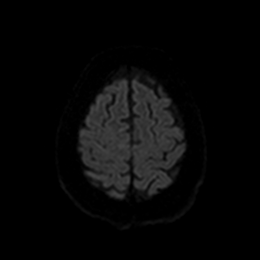
[im 100/100]
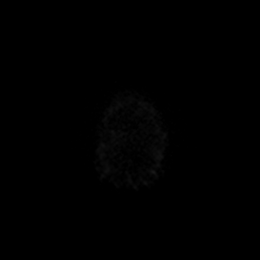

[Series 10: DWI · axial · 3.0mm · 0.88mm/px · z∈[-64,+81]mm · 3 of 50 slices shown (2 of 4)]
[im 1/50]
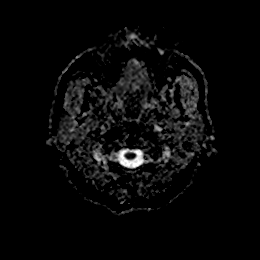
[im 25/50]
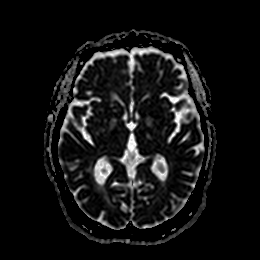
[im 50/50]
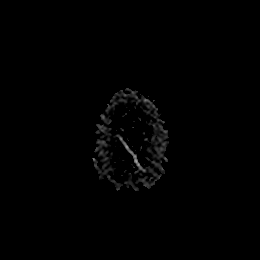

[Series 11: FLAIR · axial · 5.0mm · 0.45mm/px · z∈[-69,+71]mm · 2 of 25 slices shown]
[im 1/25]
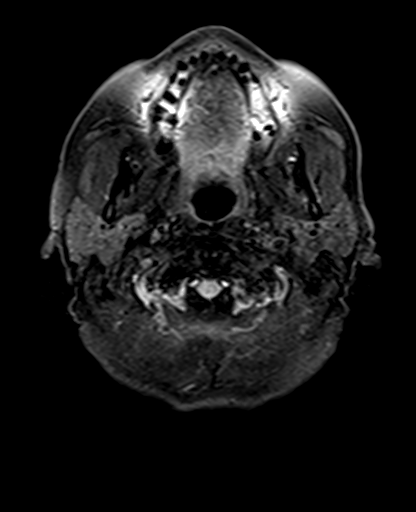
[im 25/25]
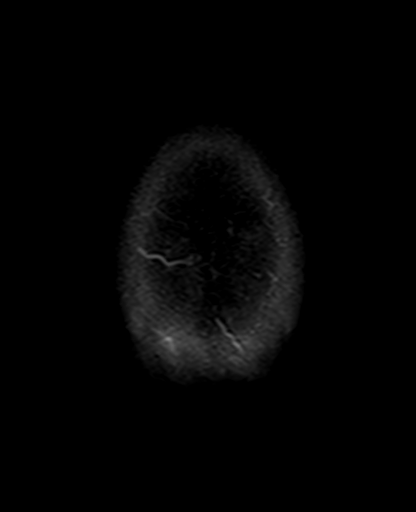

[Series 12: DWI · coronal · 4.0mm · 0.88mm/px · 4 of 64 slices shown (3 of 4)]
[im 1/64]
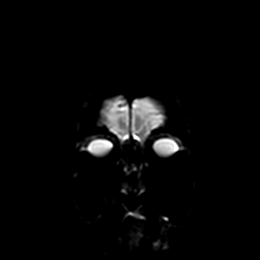
[im 22/64]
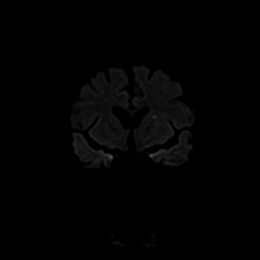
[im 43/64]
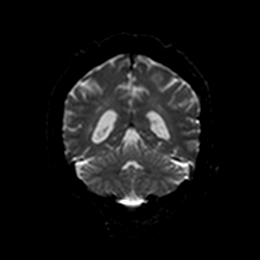
[im 64/64]
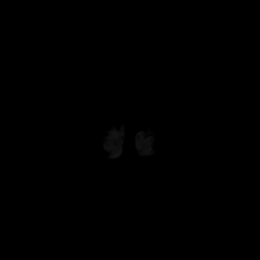

[Series 13: DWI · coronal · 4.0mm · 0.88mm/px · 2 of 32 slices shown (4 of 4)]
[im 1/32]
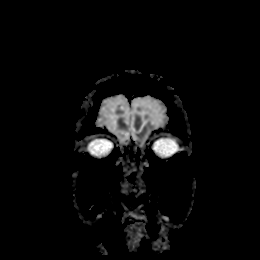
[im 32/32]
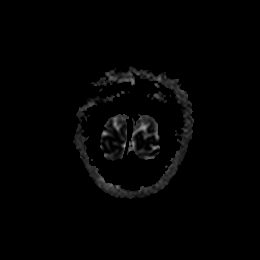

[Series 14: T1 · sagittal · 5.0mm · 0.75mm/px · 1 of 23 slices shown]
[im 1/23]
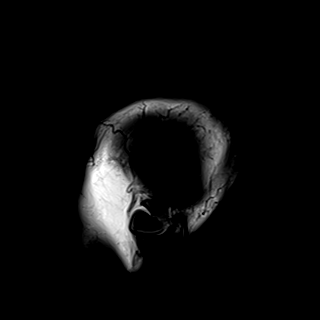

[Series 15: T2 · axial · 5.0mm · 0.72mm/px · z∈[-68,+72]mm · 2 of 25 slices shown (1 of 2)]
[im 1/25]
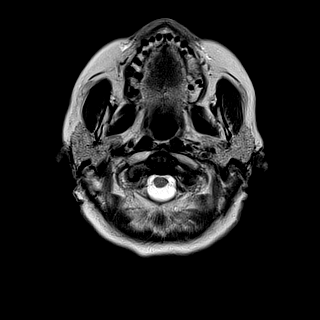
[im 25/25]
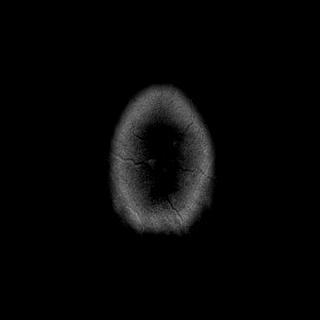

[Series 16: mag_images · axial · 3.0mm · 0.90mm/px · z∈[-85,+87]mm · 4 of 60 slices shown]
[im 1/60]
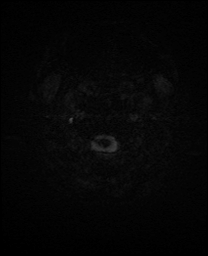
[im 20/60]
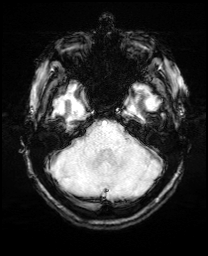
[im 40/60]
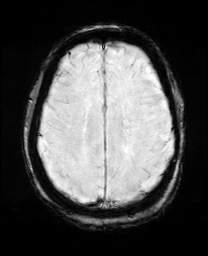
[im 60/60]
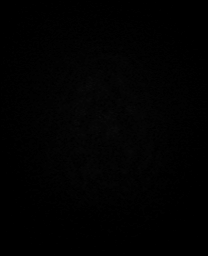

[Series 17: pha_images · axial · 3.0mm · 0.90mm/px · z∈[-85,+81]mm · 4 of 58 slices shown]
[im 1/58]
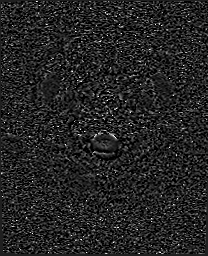
[im 20/58]
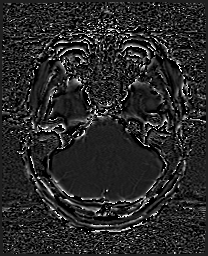
[im 39/58]
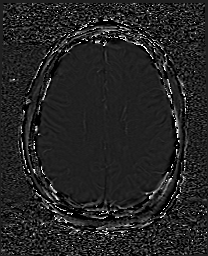
[im 58/58]
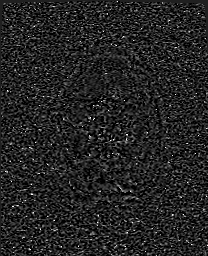

[Series 18: swi_images · axial · 3.0mm · 0.90mm/px · z∈[-85,+87]mm · 4 of 60 slices shown]
[im 1/60]
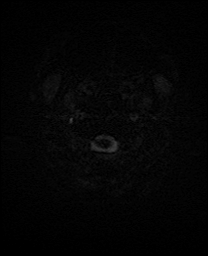
[im 20/60]
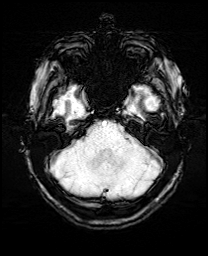
[im 40/60]
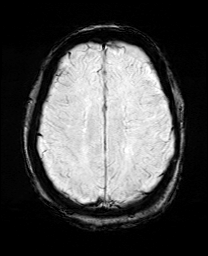
[im 60/60]
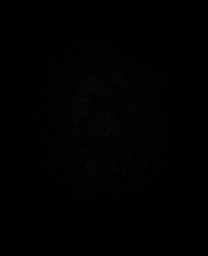

[Series 19: mip_images(sw) · axial · 24.0mm · 0.90mm/px · z∈[-75,+76]mm · 3 of 53 slices shown]
[im 1/53]
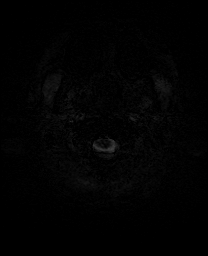
[im 27/53]
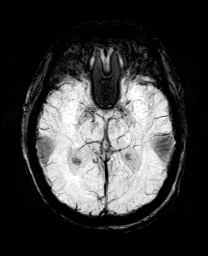
[im 53/53]
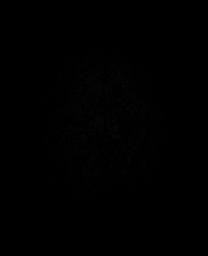

[Series 21: T2 · coronal · 5.0mm · 0.34mm/px · 2 of 29 slices shown (2 of 2)]
[im 1/29]
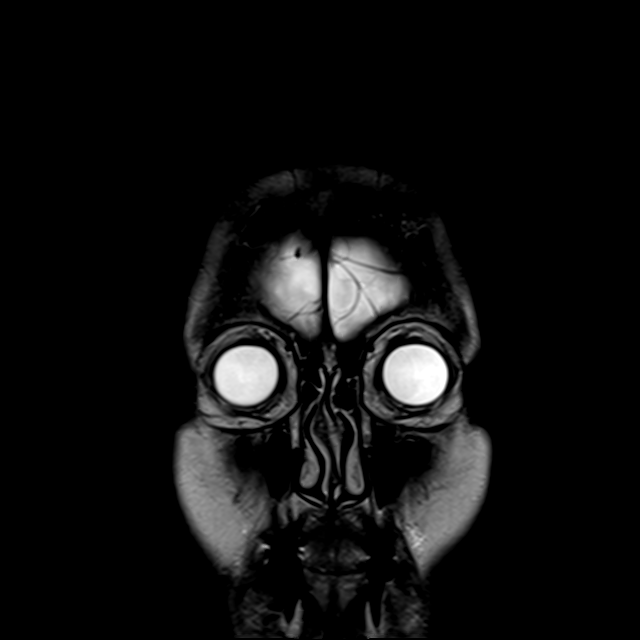
[im 29/29]
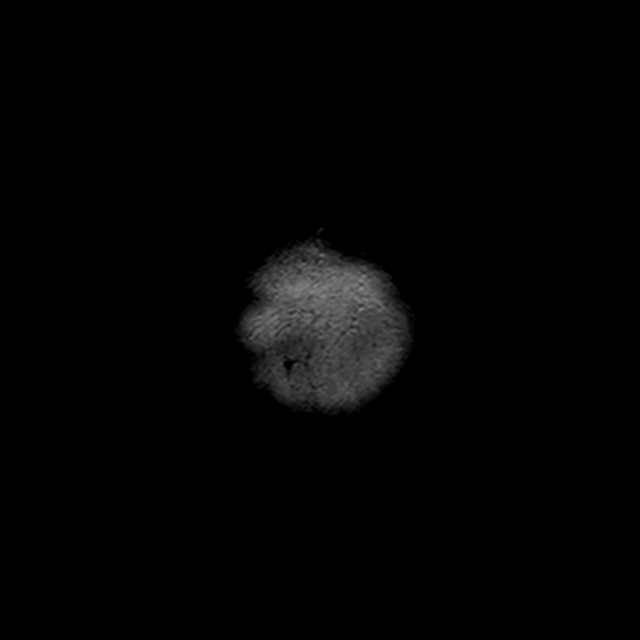

[37 of 48 positions shown; findings below may reference images not displayed]

FINDINGS: Brain: There are patchy acute infarcts involving the basal
ganglia/corona radiata bilaterally, left larger than right. A
lacunar infarct in the posterior limb of the left internal
capsule/lateral left thalamus is chronic but new from the prior MRI.
T2 hyperintensities elsewhere in the cerebral white matter
bilaterally have mildly progressed from the prior MRI and are
nonspecific but compatible with mildly age advanced chronic small
vessel ischemic disease. No intracranial hemorrhage, mass, midline
shift, or extra-axial fluid collection is identified. Mild cerebral
atrophy is within normal limits for age.

Vascular: Major intracranial vascular flow voids are preserved.

Skull and upper cervical spine: Unremarkable bone marrow signal.
C3-4 facet arthrosis with grade 1 anterolisthesis.

Sinuses/Orbits: Right cataract extraction. Paranasal sinuses and
mastoid air cells are clear.

Other: None.
IMPRESSION: 1. Patchy acute infarcts involving the left greater than right basal
ganglia and corona radiata.
2. Mild chronic small vessel ischemic disease.

## 2021-05-23 ENCOUNTER — Non-Acute Institutional Stay (SKILLED_NURSING_FACILITY): Payer: Medicare Other | Admitting: Orthopedic Surgery

## 2021-05-23 ENCOUNTER — Encounter: Payer: Self-pay | Admitting: Orthopedic Surgery

## 2021-05-23 DIAGNOSIS — F32A Depression, unspecified: Secondary | ICD-10-CM

## 2021-05-23 DIAGNOSIS — M810 Age-related osteoporosis without current pathological fracture: Secondary | ICD-10-CM

## 2021-05-23 DIAGNOSIS — E782 Mixed hyperlipidemia: Secondary | ICD-10-CM

## 2021-05-23 DIAGNOSIS — I10 Essential (primary) hypertension: Secondary | ICD-10-CM

## 2021-05-23 DIAGNOSIS — H6123 Impacted cerumen, bilateral: Secondary | ICD-10-CM | POA: Diagnosis not present

## 2021-05-23 DIAGNOSIS — K5901 Slow transit constipation: Secondary | ICD-10-CM

## 2021-05-23 DIAGNOSIS — K219 Gastro-esophageal reflux disease without esophagitis: Secondary | ICD-10-CM

## 2021-05-23 DIAGNOSIS — Z8673 Personal history of transient ischemic attack (TIA), and cerebral infarction without residual deficits: Secondary | ICD-10-CM | POA: Diagnosis not present

## 2021-05-23 DIAGNOSIS — I69391 Dysphagia following cerebral infarction: Secondary | ICD-10-CM

## 2021-05-23 NOTE — Progress Notes (Signed)
Location:   [place] Room Number: 147 Place of Service:  SNF [phone]) Provider:  Windell Moulding, NP  [name], MD  Patient Care Team: [name], MD as PCP - General (Internal Medicine) Wylene Simmer, MD as Consulting Physician (Orthopedic Surgery) Cameron Sprang, MD as Consulting Physician (Neurology)  Extended Emergency Contact Information Primary Emergency Contact: [facility] Address: [address]          [place], [place] [address] Johnnette Litter of [place] Mobile Phone: [phone] Relation: Sister Interpreter needed? No Secondary Emergency Contact: Forbis,Jim  [place] [employer] Phone: [phone] Relation: Brother  Code Status:  DNR Goals of care: Advanced Directive information Advanced Directives 05/23/2021  Does Patient Have a Medical Advance Directive? Yes  Type of Paramedic of [place];Living will;Out of facility DNR (pink MOST or yellow form)  Does patient want to make changes to medical advance directive? No - Patient declined  Copy of [place] in Chart? Yes - validated most recent copy scanned in chart (See row information)  Would patient like information on creating a medical advance directive? -  Pre-existing out of facility DNR order (yellow form or pink MOST form) Yellow form placed in chart (order not valid for inpatient use);Pink MOST form placed in chart (order not valid for inpatient use)     Chief Complaint  Patient presents with   Medical Management of Chronic Issues    HPI:  Pt is a 75 y.o. female seen today for medical management of chronic diseases.   She currently resides on the skilled nursing unit at [facility]. Past medical history includes: HTN, HLD, left arm weakness, dyarthria, aphasia, confusion, depression/anxiety, OA, and abnormal gait.    Difficulty hearing- she has noticed she is having to turn television volume louder in past  month HTN- BUN/creat 19/0.6 01/2021, remains on metoprolol HLD- LDL 42 06/2020, remains on Lipitor HX CVA- hospitalized 04/20-05/21 due to new onset dysarthria/aphasia/confusion/left sided weakness, MRI confirmed bilateral CVA L>R, continues to have left sided weakness, she is able to move left arm and open left hand, remains on plavix and statin Dysphagia- no recent aspirations, DYS 3 diet GERD- denies increased reflux, remains on Protonix Depression- denies increased blues/depression, remains on Lexapro Constipation- LBM 10/29, asking for senna to be given every morning, does not like taking at night Osteoporosis- DEXA 2019 with Tscore -2.7, refused injections, poor candidate for fosamax, remains on calcium and vitamin D supplement  No recent falls, injuries or behavioral outbursts. Ambulates with power wheelchair.   Recent weights:  11/01- 127.2 lbs  10/01- 126 lbs  08/01- 121.8 lbs  Recent blood pressures:  10/26- 118/79  10/19- 143/80  10/12- 119/77     Past Medical History:  Diagnosis Date   Anxiety    Aortic atherosclerosis (HCC)    Arthritis    Complication of anesthesia    per pt, slow to wake up past sedation.   CVA (cerebral vascular accident) ([place]) 11/10/2019   Depression    Diverticulosis    Hiatal hernia    Hyperplastic colon polyp    Hypertension    Neuropathy    Osteoarthritis    Osteoporosis    Renal mass    Stomach upset    has a senstive stomach   Throat clearing    has a cough at times   Urinary incontinence    Vitamin D deficiency    Past Surgical History:  Procedure Laterality Date  COLONOSCOPY     FACIAL COSMETIC SURGERY     over 15 years ago   [facility]     middle rt hand-   HAMMER TOE SURGERY  12/27/2011   Procedure: HAMMER TOE CORRECTION;  Surgeon: Wylene Simmer, MD;  Location: [place];  Service: Orthopedics;  Laterality: Left;  2-4th    Allergies  Allergen Reactions   Chlorhexidine     Allergies as  of 05/23/2021       Reactions   Chlorhexidine         Medication List        Accurate as of May 23, 2021  9:54 AM. If you have any questions, ask your nurse or doctor.          STOP taking these medications    Melatonin 5 MG Caps Stopped by: Yvonna Alanis, NP       TAKE these medications    acetaminophen 325 MG tablet Commonly known as: TYLENOL Take 650 mg by mouth at bedtime. And q 6 hrs prn   acetaminophen 500 MG tablet Commonly known as: TYLENOL Take 1,000 mg by mouth 2 (two) times daily as needed for moderate pain or mild pain.   atorvastatin 40 MG tablet Commonly known as: LIPITOR Take 1 tablet (40 mg total) by mouth daily.   bisacodyl 5 MG EC tablet Generic drug: bisacodyl Take 5 mg by mouth daily as needed for mild constipation or moderate constipation.   bisacodyl 10 MG suppository Commonly known as: DULCOLAX If not relieved by MOM, give 10 mg Bisacodyl suppositiory rectally X 1 dose in 24 hours as needed (Do not use constipation standing orders for residents with renal failure/CFR less than 30. Contact MD for orders) (Physician Order)   BOOST PLUS PO Take 1 Can by mouth daily as needed (Mid morning, Vanilla).   clopidogrel 75 MG tablet Commonly known as: PLAVIX Take 1 tablet (75 mg total) by mouth daily.   cycloSPORINE 0.05 % ophthalmic emulsion Commonly known as: RESTASIS Place 1 drop into both eyes 2 (two) times daily.   dicyclomine 10 MG capsule Commonly known as: BENTYL Take 10 mg by mouth 3 (three) times daily as needed for spasms.   docusate sodium 100 MG capsule Commonly known as: COLACE Take 100 mg by mouth daily.   escitalopram 10 MG tablet Commonly known as: LEXAPRO Take 20 mg by mouth daily. In the evening between 4-7 pm   magnesium hydroxide 400 MG/5ML suspension Commonly known as: MILK OF MAGNESIA If no BM in 3 days, give 30 cc Milk of Magnesium p.o. x 1 dose in 24 hours as needed (Do not use standing constipation orders  for residents with renal failure CFR less than 30. Contact MD for orders) (Physician Order)   metoprolol tartrate 25 MG tablet Commonly known as: LOPRESSOR Take 12.5 mg by mouth 2 (two) times daily.   NON FORMULARY Diet: Regular, Chopped/Dysphagia 3   Special Instructions: Aspiration precautions. May have bacon per SLP.   pantoprazole 20 MG tablet Commonly known as: PROTONIX Take 20 mg by mouth daily.   RA SALINE ENEMA RE If not relieved by Biscodyl suppository, give disposable Saline Enema rectally X 1 dose/24 hrs as needed (Do not use constipation standing orders for residents with renal failure/CFR less than 30. Contact MD for orders)(Physician Or   senna 8.6 MG Tabs tablet Commonly known as: SENOKOT Take 1 tablet by mouth at bedtime.   simethicone 80 MG chewable tablet Commonly known  as: MYLICON Chew 1 tablet (80 mg total) by mouth every 8 (eight) hours as needed for flatulence.   UNABLE TO FIND Med Name: 1-2-3 Cream; 60 grams; amt: 1 application; topical  Special Instructions: Apply 1-2-3 cream twice daily to buttocks as needed for skin  flare up. Continue to use Desitin cream as a preventative. U        Review of Systems  Constitutional:  Negative for activity change, appetite change, chills, fatigue and fever.  HENT:  Positive for hearing loss and trouble swallowing.   Eyes:  Negative for visual disturbance.  Respiratory:  Negative for cough, shortness of breath and wheezing.   Cardiovascular:  Negative for chest pain and leg swelling.  Gastrointestinal:  Positive for constipation. Negative for abdominal distention, abdominal pain, blood in stool, diarrhea, nausea and vomiting.  Genitourinary:  Negative for dysuria, frequency and hematuria.  Musculoskeletal:  Positive for gait problem.  Skin: Negative.   Neurological:  Positive for weakness. Negative for dizziness, numbness and headaches.  Psychiatric/Behavioral:  Positive for dysphoric mood. Negative for  agitation, confusion and sleep disturbance. The patient is nervous/anxious.    Immunization History  Administered Date(s) Administered   Fluad Quad(high Dose 65+) 04/27/2019   Influenza, High Dose Seasonal PF 05/03/2017, 05/19/2018   Influenza,inj,Quad PF,6+ Mos 04/29/2013, 05/18/2014, 05/02/2015, 04/17/2016   Influenza-Unspecified 05/13/2020, 04/26/2021   Moderna SARS-COV2 Booster Vaccination 12/25/2020   PFIZER(Purple Top)SARS-COV-2 Vaccination 08/27/2019, 09/21/2019, 06/13/2020   Pfizer Covid-19 Vaccine Bivalent Booster 5y-11y 05/03/2021   Pneumococcal Conjugate-13 05/11/2013   Pneumococcal Polysaccharide-23 05/02/2015   Tdap 01/23/2021   Zoster Recombinat (Shingrix) 01/20/2021, 03/24/2021   Zoster, Unspecified 01/20/2021   Pertinent  Health Maintenance Due  Topic Date Due   INFLUENZA VACCINE  Completed   DEXA SCAN  Discontinued   COLONOSCOPY (Pts 45-71yrs Insurance coverage will need to be confirmed)  Discontinued   Fall Risk 11/23/2019 11/23/2019 11/24/2019 12/07/2019 09/26/2020  Falls in the past year? - - - 0 0  Was there an injury with Fall? - - - 0 0  Fall Risk Category Calculator - - - 0 0  Fall Risk Category - - - Low Low  Patient Fall Risk Level High fall risk High fall risk High fall risk Low fall risk Low fall risk   Functional Status Survey:    Vitals:   05/23/21 0941  BP: 118/79  Pulse: 62  Resp: 16  Temp: (!) 97.5 F (36.4 C)  SpO2: 93%  Weight: 126 lb (57.2 kg)  Height: 5\' 2"  (1.575 m)   Body mass index is 23.05 kg/m. Physical Exam Vitals reviewed.  Constitutional:      General: She is not in acute distress. HENT:     Head: Normocephalic.     Right Ear: There is impacted cerumen.     Left Ear: There is impacted cerumen.     Nose: Nose normal.     Mouth/Throat:     Mouth: Mucous membranes are moist.  Eyes:     General:        Right eye: No discharge.        Left eye: No discharge.  Neck:     Vascular: No carotid bruit.  Cardiovascular:     Rate  and Rhythm: Normal rate and regular rhythm.     Pulses: Normal pulses.     Heart sounds: Normal heart sounds. No murmur heard. Pulmonary:     Effort: Pulmonary effort is normal. No respiratory distress.     Breath sounds: Normal  breath sounds. No wheezing.  Abdominal:     General: Bowel sounds are normal. There is no distension.     Palpations: Abdomen is soft.     Tenderness: There is no abdominal tenderness.  Musculoskeletal:     Cervical back: Normal range of motion.     Right lower leg: No edema.     Left lower leg: No edema.  Lymphadenopathy:     Cervical: No cervical adenopathy.  Skin:    General: Skin is warm and dry.     Capillary Refill: Capillary refill takes less than 2 seconds.  Neurological:     General: No focal deficit present.     Mental Status: She is alert and oriented to person, place, and time.     Motor: Weakness present.     Gait: Gait abnormal.     Comments: Left hand grip 3/5, right 5/5  Psychiatric:        Mood and Affect: Mood normal. Affect is flat.        Behavior: Behavior normal.    Labs reviewed: Recent Labs    07/04/20 0000 01/24/21 0000  NA 140 141  K 4.0 3.9  CL 109* 106  CO2 23* 24*  BUN 16 19  CREATININE 0.4* 0.6  CALCIUM 9.6 9.7   Recent Labs    01/24/21 0000  AST 14  ALT 24  ALKPHOS 112  ALBUMIN 3.9   Recent Labs    01/24/21 0000  WBC 7.0  HGB 14.2  HCT 43  PLT 236   Lab Results  Component Value Date   TSH 1.728 11/10/2019   Lab Results  Component Value Date   HGBA1C 5.1 11/11/2019   Lab Results  Component Value Date   CHOL 112 07/04/2020   HDL 56 07/04/2020   LDLCALC 42 07/04/2020   LDLDIRECT 116.0 04/27/2019   TRIG 69 07/04/2020   CHOLHDL 3.0 11/11/2019    Significant Diagnostic Results in last 30 days:  No results found.  Assessment/Plan 1. Bilateral impacted cerumen - cannot visualize TM - start debrox 5 gtts- apply to both ears bid x 5 days - flush ears with warm water when debrox  complete  2. Primary hypertension - controlled  - cont metoprolol  3. Mixed hyperlipidemia - LDL 42 06/2020 - cont Lipitor  4. H/O: CVA (cerebrovascular accident) - remains on plavix and statin - cont skilled nursing care  5. Dysphagia following cerebrovascular accident (CVA) - no recent aspirations -cont DYS 3 diet  6. Gastroesophageal reflux disease without esophagitis - no increased reflux - cont Protonix  7. Mild depression - cont Lexapro  8. Slow transit constipation - LBM 10/29 - does not want senna in evenings- worries about having BM at night - will change senna to QAM  9. Age-related osteoporosis without current pathological fracture - DEXA 2019 t score -2.7 - refusing injections - poor candidate for fosamax - cont calcium and vitamin D supplements    Family/ staff Communication: plan discussed with patient and nurse  Labs/tests ordered: routine labs December 2022

## 2021-06-01 ENCOUNTER — Non-Acute Institutional Stay (SKILLED_NURSING_FACILITY): Payer: Medicare Other | Admitting: Adult Health

## 2021-06-01 ENCOUNTER — Encounter: Payer: Self-pay | Admitting: Adult Health

## 2021-06-01 DIAGNOSIS — R1084 Generalized abdominal pain: Secondary | ICD-10-CM | POA: Diagnosis not present

## 2021-06-01 DIAGNOSIS — R1312 Dysphagia, oropharyngeal phase: Secondary | ICD-10-CM

## 2021-06-01 DIAGNOSIS — I69354 Hemiplegia and hemiparesis following cerebral infarction affecting left non-dominant side: Secondary | ICD-10-CM | POA: Diagnosis not present

## 2021-06-01 DIAGNOSIS — K219 Gastro-esophageal reflux disease without esophagitis: Secondary | ICD-10-CM

## 2021-06-01 DIAGNOSIS — F410 Panic disorder [episodic paroxysmal anxiety] without agoraphobia: Secondary | ICD-10-CM | POA: Diagnosis not present

## 2021-06-01 DIAGNOSIS — I10 Essential (primary) hypertension: Secondary | ICD-10-CM

## 2021-06-01 DIAGNOSIS — K5901 Slow transit constipation: Secondary | ICD-10-CM

## 2021-06-01 LAB — BASIC METABOLIC PANEL
BUN: 30 — AB (ref 4–21)
CO2: 25 — AB (ref 13–22)
Chloride: 109 — AB (ref 99–108)
Creatinine: 0.7 (ref 0.5–1.1)
Glucose: 81
Potassium: 4.2 (ref 3.4–5.3)
Sodium: 144 (ref 137–147)

## 2021-06-01 LAB — CBC AND DIFFERENTIAL
HCT: 43 (ref 36–46)
Hemoglobin: 13.9 (ref 12.0–16.0)
Neutrophils Absolute: 11.6
Platelets: 280 (ref 150–399)
WBC: 13.6

## 2021-06-01 LAB — COMPREHENSIVE METABOLIC PANEL
Albumin: 4.1 (ref 3.5–5.0)
Calcium: 9.7 (ref 8.7–10.7)

## 2021-06-01 LAB — CBC: RBC: 4.6 (ref 3.87–5.11)

## 2021-06-01 MED ORDER — ALPRAZOLAM 0.5 MG PO TABS: 0.5000 mg | ORAL_TABLET | Freq: Two times a day (BID) | ORAL | 0 refills | Status: AC | PRN

## 2021-06-01 NOTE — Progress Notes (Signed)
Location:  Occupational psychologist of Service:  SNF (31) Provider:   Cindi Carbon, [place] [phone]   [name], MD  Patient Care Team: [name], MD as PCP - General (Internal Medicine) Wylene Simmer, MD as Consulting Physician (Orthopedic Surgery) Cameron Sprang, MD as Consulting Physician (Neurology)  Extended Emergency Contact Information Primary Emergency Contact: [facility] Address: [address]          [place], [place] [address] Johnnette Litter of [place] Mobile Phone: [phone] Relation: Sister Interpreter needed? No Secondary Emergency Contact: Vecchio,Jim  [place] [employer] Phone: [phone] Relation: Brother  Code Status:  DNR Goals of care: Advanced Directive information Advanced Directives 05/23/2021  Does Patient Have a Medical Advance Directive? Yes  Type of Paramedic of [place];Living will;Out of facility DNR (pink MOST or yellow form)  Does patient want to make changes to medical advance directive? No - Patient declined  Copy of [place] in Chart? Yes - validated most recent copy scanned in chart (See row information)  Would patient like information on creating a medical advance directive? -  Pre-existing out of facility DNR order (yellow form or pink MOST form) Yellow form placed in chart (order not valid for inpatient use);Pink MOST form placed in chart (order not valid for inpatient use)     Chief Complaint  Patient presents with   Acute Visit    Anxiety     HPI:  Pt is a 75 y.o. female seen today for an acute visit for anxiety. PMH significant for CVA 4/21 wth left sided weakness. She has progressed and is able to transfer out of the wheelchair. The nurse reports she is moving to enhanced assisted living today from skilled care and she became very anxious and began to hyperventilate and also was feeling nauseated. She has been very  nervous about the move. She has a hx of depression/anxiety since her CVA and is followed by neurology on Lexapro. She was given 1 dose of xanax 0.5 mg and she began to calm down. For my visit she is sitting in the wheelchair with her eyes closed trying to practice deep breathing. She reports her abd hurts which started one day ago. She does not have a fever. No vomiting. She was eating and drinking well prior to this event.     Past Medical History:  Diagnosis Date   Anxiety    Aortic atherosclerosis (HCC)    Arthritis    Complication of anesthesia    per pt, slow to wake up past sedation.   CVA (cerebral vascular accident) ([place]) 11/10/2019   Depression    Diverticulosis    Hiatal hernia    Hyperplastic colon polyp    Hypertension    Neuropathy    Osteoarthritis    Osteoporosis    Renal mass    Stomach upset    has a senstive stomach   Throat clearing    has a cough at times   Urinary incontinence    Vitamin D deficiency    Past Surgical History:  Procedure Laterality Date   COLONOSCOPY     FACIAL COSMETIC SURGERY     over 15 years ago   FINGER TENDON REPAIR     middle rt hand-   HAMMER TOE SURGERY  12/27/2011   Procedure: HAMMER TOE CORRECTION;  Surgeon: Wylene Simmer, MD;  Location: [facility];  Service: Orthopedics;  Laterality: Left;  2-4th    Allergies  Allergen Reactions   Chlorhexidine     Outpatient Encounter Medications as of 06/01/2021  Medication Sig   ALPRAZolam (XANAX) 0.5 MG tablet Take 1 tablet (0.5 mg total) by mouth 2 (two) times daily as needed for up to 5 days for anxiety.   acetaminophen (TYLENOL) 325 MG tablet Take 650 mg by mouth at bedtime. And q 6 hrs prn   acetaminophen (TYLENOL) 500 MG tablet Take 1,000 mg by mouth 2 (two) times daily as needed for moderate pain or mild pain.   atorvastatin (LIPITOR) 40 MG tablet Take 1 tablet (40 mg total) by mouth daily.   bisacodyl (BISACODYL) 5 MG EC tablet Take 5 mg by mouth daily as needed  for mild constipation or moderate constipation.   bisacodyl (DULCOLAX) 10 MG suppository If not relieved by MOM, give 10 mg Bisacodyl suppositiory rectally X 1 dose in 24 hours as needed (Do not use constipation standing orders for residents with renal failure/CFR less than 30. Contact MD for orders) (Physician Order)   clopidogrel (PLAVIX) 75 MG tablet Take 1 tablet (75 mg total) by mouth daily.   cycloSPORINE (RESTASIS) 0.05 % ophthalmic emulsion Place 1 drop into both eyes 2 (two) times daily.   dicyclomine (BENTYL) 10 MG capsule Take 10 mg by mouth 3 (three) times daily as needed for spasms.   docusate sodium (COLACE) 100 MG capsule Take 100 mg by mouth daily.   escitalopram (LEXAPRO) 10 MG tablet Take 20 mg by mouth daily. In the evening between 4-7 pm   magnesium hydroxide (MILK OF MAGNESIA) 400 MG/5ML suspension If no BM in 3 days, give 30 cc Milk of Magnesium p.o. x 1 dose in 24 hours as needed (Do not use standing constipation orders for residents with renal failure CFR less than 30. Contact MD for orders) (Physician Order)   metoprolol tartrate (LOPRESSOR) 25 MG tablet Take 12.5 mg by mouth 2 (two) times daily.   NON FORMULARY Diet: Regular, Chopped/Dysphagia 3   Special Instructions: Aspiration precautions. May have bacon per SLP.   Nutritional Supplements (BOOST PLUS PO) Take 1 Can by mouth daily as needed (Mid morning, Vanilla).   pantoprazole (PROTONIX) 20 MG tablet Take 20 mg by mouth daily.   senna (SENOKOT) 8.6 MG TABS tablet Take 1 tablet by mouth at bedtime.   simethicone (MYLICON) 80 MG chewable tablet Chew 1 tablet (80 mg total) by mouth every 8 (eight) hours as needed for flatulence.   Sodium Phosphates (RA SALINE ENEMA RE) If not relieved by Biscodyl suppository, give disposable Saline Enema rectally X 1 dose/24 hrs as needed (Do not use constipation standing orders for residents with renal failure/CFR less than 30. Contact MD for orders)(Physician Or   UNABLE TO FIND Med  Name: 1-2-3 Cream; 60 grams; amt: 1 application; topical  Special Instructions: Apply 1-2-3 cream twice daily to buttocks as needed for skin  flare up. Continue to use Desitin cream as a preventative. U   No facility-administered encounter medications on file as of 06/01/2021.    Review of Systems  Constitutional:  Negative for activity change, appetite change, chills, diaphoresis, fatigue, fever and unexpected weight change.  HENT:  Negative for congestion.   Respiratory:  Positive for shortness of breath (hyperventilate). Negative for cough and wheezing.   Cardiovascular:  Negative for chest pain, palpitations and leg swelling.  Gastrointestinal:  Positive for abdominal pain and nausea. Negative for abdominal distention, constipation, diarrhea and vomiting.  Genitourinary:  Negative for  difficulty urinating and dysuria.  Musculoskeletal:  Positive for gait problem. Negative for arthralgias, back pain, joint swelling and myalgias.  Neurological:  Positive for facial asymmetry and weakness. Negative for dizziness, tremors, seizures, syncope, speech difficulty, light-headedness, numbness and headaches.  Psychiatric/Behavioral:  Negative for agitation, behavioral problems and confusion.    Immunization History  Administered Date(s) Administered   Fluad Quad(high Dose 65+) 04/27/2019   Influenza, High Dose Seasonal PF 05/03/2017, 05/19/2018   Influenza,inj,Quad PF,6+ Mos 04/29/2013, 05/18/2014, 05/02/2015, 04/17/2016   Influenza-Unspecified 05/13/2020, 04/26/2021   Moderna SARS-COV2 Booster Vaccination 12/25/2020   PFIZER(Purple Top)SARS-COV-2 Vaccination 08/27/2019, 09/21/2019, 06/13/2020   Pfizer Covid-19 Vaccine Bivalent Booster 5y-11y 05/03/2021   Pneumococcal Conjugate-13 05/11/2013   Pneumococcal Polysaccharide-23 05/02/2015   Tdap 01/23/2021   Zoster Recombinat (Shingrix) 01/20/2021, 03/24/2021   Zoster, Unspecified 01/20/2021   Pertinent  Health Maintenance Due  Topic Date Due    INFLUENZA VACCINE  Completed   DEXA SCAN  Discontinued   COLONOSCOPY (Pts 45-53yrs Insurance coverage will need to be confirmed)  Discontinued   Fall Risk 11/23/2019 11/23/2019 11/24/2019 12/07/2019 09/26/2020  Falls in the past year? - - - 0 0  Was there an injury with Fall? - - - 0 0  Fall Risk Category Calculator - - - 0 0  Fall Risk Category - - - Low Low  Patient Fall Risk Level High fall risk High fall risk High fall risk Low fall risk Low fall risk   Functional Status Survey:    There were no vitals filed for this visit. There is no height or weight on file to calculate BMI. Physical Exam  Labs reviewed: Recent Labs    07/04/20 0000 01/24/21 0000  NA 140 141  K 4.0 3.9  CL 109* 106  CO2 23* 24*  BUN 16 19  CREATININE 0.4* 0.6  CALCIUM 9.6 9.7   Recent Labs    01/24/21 0000  AST 14  ALT 24  ALKPHOS 112  ALBUMIN 3.9   Recent Labs    01/24/21 0000  WBC 7.0  HGB 14.2  HCT 43  PLT 236   Lab Results  Component Value Date   TSH 1.728 11/10/2019   Lab Results  Component Value Date   HGBA1C 5.1 11/11/2019   Lab Results  Component Value Date   CHOL 112 07/04/2020   HDL 56 07/04/2020   LDLCALC 42 07/04/2020   LDLDIRECT 116.0 04/27/2019   TRIG 69 07/04/2020   CHOLHDL 3.0 11/11/2019    Significant Diagnostic Results in last 30 days:  No results found.  Assessment/Plan 1. Panic attack Due to move Continues on lexapro  - ALPRAZolam (XANAX) 0.5 MG tablet; Take 1 tablet (0.5 mg total) by mouth 2 (two) times daily as needed for up to 5 days for anxiety.  Dispense: 10 tablet; Refill: 0  2. Generalized abdominal pain Mild, will check KUB, CBC BMP Possibly due to anxiety   3. Primary hypertension Controlled with lopressor   4. 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 Can d/c enhanced AL  5. Gastroesophageal reflux disease without esophagitis Continue protonix  6.  Slow transit constipation Controlled with senokot s at bedtime  7. Dysphagia Continue D3 diet   Family/ staff Communication: discussed with her sister [name] and we both feel she is having trouble adjusting to the move. We will look into her abd complaint and if worsening order CT  Labs/tests ordered:  CBC BMP  KUB

## 2021-06-02 LAB — HEPATIC FUNCTION PANEL
ALT: 23 (ref 7–35)
AST: 18 (ref 13–35)
Alkaline Phosphatase: 79 (ref 25–125)
Bilirubin, Total: 0.7

## 2021-06-02 NOTE — Telephone Encounter (Signed)
Spoke to sister [place]. She was moved yesterday to enhanced assisted living at [facility], has all the support she needs with ADLs. She was very hesitant, yesterday morning when her things were moved, she had a panic attack. When sister came they had to give her Xanax, she was non-responsive after. Sister reports dysphoria is the best word to describe her. She is negative about everything they do, she gets uncomfortable if [name] moves something in her dresser. Word-retrieval is much worse, more stammering, pointing at things often. [name] tried to get Education officer, museum to come talk with her and go to church for 1.5 yrs with no help. Can increase Lexapro to 30mg  daily but at this point she will benefit from seeing psychiatrist. [name] would like to hold off on increase Lexapro for now. They have stopped all the therapies so [name] will try to do with her.

## 2021-07-03 ENCOUNTER — Encounter: Payer: Self-pay | Admitting: Neurology

## 2021-07-06 ENCOUNTER — Ambulatory Visit: Payer: Medicare Other | Admitting: Neurology

## 2021-07-06 ENCOUNTER — Encounter: Payer: Self-pay | Admitting: Internal Medicine

## 2021-08-01 ENCOUNTER — Encounter: Payer: Self-pay | Admitting: Neurology

## 2021-08-04 ENCOUNTER — Encounter: Payer: Self-pay | Admitting: Neurology

## 2021-08-04 ENCOUNTER — Other Ambulatory Visit: Payer: Self-pay

## 2021-08-04 ENCOUNTER — Ambulatory Visit: Payer: Medicare Other | Admitting: Neurology

## 2021-08-04 VITALS — BP 109/68 | HR 61 | Ht 62.0 in | Wt 126.0 lb

## 2021-08-04 DIAGNOSIS — I6359 Cerebral infarction due to unspecified occlusion or stenosis of other cerebral artery: Secondary | ICD-10-CM

## 2021-08-04 NOTE — Progress Notes (Signed)
NEUROLOGY FOLLOW UP OFFICE NOTE  Lynn [name] [id number] [birth year]  HISTORY OF PRESENT ILLNESS: I had the pleasure of seeing Lynn [name] in follow-up in the neurology clinic on 08/04/2021.  The patient was last seen 10 months ago for bilateral basal ganglia strokes that occurred in April 2021. Her sister Lynn [name] is present to provide additional information. Lynn [name] has been in contact with our office, she had moved to [facility] in 05/2021 and has all the support she needs with ADLs. There is a lot of anxiety, she has had panic attacks, she became unresponsive after given Xanax. Lynn [name] reported she is negative about everything they do, word-retrieval is worse with more stammering and pointing to things often. MyChart message a few days ago reported she has been seen by Dr. Casimiro Needle with Psychiatry and started on Lexapro 10mg  with Wellbutrin 150mg  daily, with plans to recheck in 1-2 months.   She denies any headaches, dizziness, diplopia, focal numbness/tingling, no falls. She has a little problem swallowing and sometimes chokes. She has mild dysarthria. She reports her left side is still a little weaker, but Lynn [name] notes she can now lift above gravity. She walks with her walker once a day and can do a pretty good job, she feels like she is dragging her right leg. Lynn [name] reports her right foot turns in. She has occasional sleep difficulties. Lynn [name] notes that she does not drink enough water, causing constipation. She used to read but now "won't read." She says she has a hard time seeing, however her eye exam was 20/20 with glasses. She says her neck hurts when she bends her head down. She used to have cognitive therapy but it was stopped due to lack of progress and insurance coverage. Her occupational therapist thinks she can physically do things and encourages more socialization.   History on Initial Assessment 05/09/2017: This is a pleasant 76 yo RH woman with a history of  hypertension, who presented for evaluation of gait unsteadiness. She had been seeing Neurosurgery for back pain. She reported symptoms started a couple of years ago, but worsened in the past 6-8 months. She had fractured her left foot and was doing PT, noting difficulties with heel-toe walking, worse when she closes her eyes. She has come very close to falling, she was drying her hair bending forward and pitched forward. She has numbness and tingling in both hands and feet up to the mid-calf region. She has occasional sharp pains in both feet. Over the past couple of years, she has noticed clumsiness in both hands, worse the past year. If she does much lifting, she starts having back pain. She denies any neck pain except when bending head forward too much. She has some stress incontinence, no bowel dysfunction. She has occasional headaches associated with eye issues. She has a little blurred vision with dry eye. Over the past 1.5 years, she has noticed a change in her speech, her voice has thickened and she is more hesitant when talking, worse this past year. She has also noticed some anxiety worsens it as well. She occasionally chokes on her food. She denies any family history of similar symptoms. She had an MRI brain without contrast at [facility] last 02/28/17, images unavailable for review, there were no acute changes. There was mild FLAIR white matter changes, normal brain volume.   Update 12/07/2019: She presents today in a stretcher, with her sister present to provide additional information. Records were reviewed. Unfortunately she had bilateral strokes  last month. She was brought to the ER on 4/20 for "slow thinking" and expressive aphasia. She was noted to have left-sided weakness and expressive aphasia in the ER. Family had noticed she had slowed down in the past few years since moving in to her mother's home, in the past 6 months she had not cared for her appearance and was walking with a cane  due to gait unsteadiness. I personally reviewed MRI brain without contrast which showed patchy acute infarcts involving the basal ganglia/corona radiata bilaterally, left greater than right. There was mildly age advanced chronic microvascular disease. CTA head and neck no significant stenosis. LDL elevated, HbA1c 5.1. During her hospitalization, she was noted to have mild dysarthria and psychomotor slowing, left UE 0/5, LLE 3/5 proximally, 4/5 distally, 4/5 right UE and LE with right foot drop. Etiology of stroke unclear, possibly small vessel disease, however given bilateral involvement, 30-day cardiac monitor was recommended. She was discharged on DAPT for 3 months, then Plavix alone. She had an episode of decreased responsiveness felt due to toxic/metabolic/infectious cause, WBC was 20.6. Outpatient cognitive testing was recommended, however unable to perform today due to her mental status. Patient is drowsy, arousable to say "hi," recognizing the examiner from our prior visits, able to follow simple commands on the right arm. Flaccid left, unable to move both LE much.   PAST MEDICAL HISTORY: Past Medical History:  Diagnosis Date   Anxiety    Aortic atherosclerosis (HCC)    Arthritis    Complication of anesthesia    per pt, slow to wake up past sedation.   CVA (cerebral vascular accident) ([place]) 11/10/2019   Depression    Diverticulosis    Hiatal hernia    Hyperplastic colon polyp    Hypertension    Neuropathy    Osteoarthritis    Osteoporosis    Renal mass    Stomach upset    has a senstive stomach   Throat clearing    has a cough at times   Urinary incontinence    Vitamin D deficiency     MEDICATIONS: Current Outpatient Medications on File Prior to Visit  Medication Sig Dispense Refill   acetaminophen (TYLENOL) 325 MG tablet Take 650 mg by mouth at bedtime. And q 6 hrs prn     acetaminophen (TYLENOL) 500 MG tablet Take 1,000 mg by mouth 2 (two) times daily as needed for moderate  pain or mild pain.     atorvastatin (LIPITOR) 40 MG tablet Take 1 tablet (40 mg total) by mouth daily. 30 tablet 0   bisacodyl (BISACODYL) 5 MG EC tablet Take 5 mg by mouth daily as needed for mild constipation or moderate constipation.     bisacodyl (DULCOLAX) 10 MG suppository If not relieved by MOM, give 10 mg Bisacodyl suppositiory rectally X 1 dose in 24 hours as needed (Do not use constipation standing orders for residents with renal failure/CFR less than 30. Contact MD for orders) (Physician Order)     clopidogrel (PLAVIX) 75 MG tablet Take 1 tablet (75 mg total) by mouth daily. 30 tablet 0   cycloSPORINE (RESTASIS) 0.05 % ophthalmic emulsion Place 1 drop into both eyes 2 (two) times daily.     dicyclomine (BENTYL) 10 MG capsule Take 10 mg by mouth 3 (three) times daily as needed for spasms.     docusate sodium (COLACE) 100 MG capsule Take 100 mg by mouth daily.     escitalopram (LEXAPRO) 10 MG tablet Take 20 mg by mouth daily.  In the evening between 4-7 pm     magnesium hydroxide (MILK OF MAGNESIA) 400 MG/5ML suspension If no BM in 3 days, give 30 cc Milk of Magnesium p.o. x 1 dose in 24 hours as needed (Do not use standing constipation orders for residents with renal failure CFR less than 30. Contact MD for orders) (Physician Order)     metoprolol tartrate (LOPRESSOR) 25 MG tablet Take 12.5 mg by mouth 2 (two) times daily.     NON FORMULARY Diet: Regular, Chopped/Dysphagia 3   Special Instructions: Aspiration precautions. May have bacon per SLP.     Nutritional Supplements (BOOST PLUS PO) Take 1 Can by mouth daily as needed (Mid morning, Vanilla).     pantoprazole (PROTONIX) 20 MG tablet Take 20 mg by mouth daily.     senna (SENOKOT) 8.6 MG TABS tablet Take 1 tablet by mouth at bedtime.     simethicone (MYLICON) 80 MG chewable tablet Chew 1 tablet (80 mg total) by mouth every 8 (eight) hours as needed for flatulence. 120 tablet 0   Sodium Phosphates (RA SALINE ENEMA RE) If not relieved by  Biscodyl suppository, give disposable Saline Enema rectally X 1 dose/24 hrs as needed (Do not use constipation standing orders for residents with renal failure/CFR less than 30. Contact MD for orders)(Physician Or     UNABLE TO FIND Med Name: 1-2-3 Cream; 60 grams; amt: 1 application; topical  Special Instructions: Apply 1-2-3 cream twice daily to buttocks as needed for skin  flare up. Continue to use Desitin cream as a preventative. U     No current facility-administered medications on file prior to visit.    ALLERGIES: Allergies  Allergen Reactions   Chlorhexidine     FAMILY HISTORY: Family History  Problem Relation Age of Onset   Arthritis Mother    Colon cancer Father    COPD Father 34       died at 52   Throat cancer Brother    Breast cancer Other    Esophageal cancer Neg Hx    Rectal cancer Neg Hx    Stomach cancer Neg Hx    Stroke Neg Hx     SOCIAL HISTORY: Social History   Socioeconomic History   Marital status: Divorced    Spouse name: Not on file   Number of children: 1   Years of education: 16   Highest education level: Not on file  Occupational History   Occupation: retired  Tobacco Use   Smoking status: Former    Years: 10.00    Types: Cigarettes    Quit date: 12/21/1975    Years since quitting: 45.6   Smokeless tobacco: Never  Vaping Use   Vaping Use: Never used  Substance and Sexual Activity   Alcohol use: Yes    Comment: rarely   Drug use: No   Sexual activity: Not Currently  Other Topics Concern   Not on file  Social History Narrative   Divorced. Children: 1 son. Patient supports her mother- mother lives with her- 2 story home.       Retired from [employer]  Education: college.       Hobbies: riding horses in the past, family time, enjoys some gardening   Social Determinants of Radio broadcast assistant Strain: Not on file  Food Insecurity: Not on file  Transportation Needs: Not on file  Physical Activity: Not on file   Stress: Not on file  Social Connections: Not on file  Intimate  Partner Violence: Not on file     PHYSICAL EXAM: Vitals:   08/04/21 1406  BP: 109/68  Pulse: 61  SpO2: 98%   General: No acute distress, flat affect Head:  Normocephalic/atraumatic Skin/Extremities: No rash, no edema Neurological Exam: alert and awake. No aphasia or dysarthria. Fund of knowledge is appropriate.  Attention and concentration are normal.   Cranial nerves: Pupils equal, round. Extraocular movements intact with no nystagmus. Visual fields full.  No facial asymmetry.  Motor: Bulk and tone normal, muscle strength 5/5 throughout with no pronator drift.  Ataxia on left finger to nose testing. Gait slow and cautious with walker, ataxic on left.    IMPRESSION: This is a pleasant 76 yo RH woman with a history of hypertension, initially seen for gait unsteadiness felt due to neuropathy, who had bilateral basal ganglia strokes, L>R in April 2021. She has gained increased mobility with mostly left-sided ataxia for residual deficits. She is also having a lot of mood changes and is now followed by Psychiatry. Continue Plavix, control of vascular risk factors. She was encouraged to start seeing a therapist as well. Continue exercise program with PT/OT. Follow-up in 6-8 months, call for any changes.    Thank you for allowing me to participate in her care.  Please do not hesitate to call for any questions or concerns.   Ellouise Newer, M.D.   CC: Dr. Lyndel Safe

## 2021-08-04 NOTE — Patient Instructions (Addendum)
Looking good! Continue with current medications, would look into seeing a therapist/counselor as well. Follow-up in 6-8 months, call for any changes

## 2021-08-14 ENCOUNTER — Emergency Department (HOSPITAL_COMMUNITY)
Admission: EM | Admit: 2021-08-14 | Discharge: 2021-08-14 | Disposition: A | Discharge status: Home / Self Care | Payer: Medicare Other | Attending: Emergency Medicine | Admitting: Emergency Medicine

## 2021-08-14 ENCOUNTER — Emergency Department (HOSPITAL_COMMUNITY): Payer: Medicare Other

## 2021-08-14 ENCOUNTER — Encounter (HOSPITAL_COMMUNITY): Payer: Self-pay

## 2021-08-14 ENCOUNTER — Other Ambulatory Visit: Payer: Self-pay

## 2021-08-14 DIAGNOSIS — I10 Essential (primary) hypertension: Secondary | ICD-10-CM | POA: Insufficient documentation

## 2021-08-14 DIAGNOSIS — Z79899 Other long term (current) drug therapy: Secondary | ICD-10-CM | POA: Diagnosis not present

## 2021-08-14 DIAGNOSIS — J189 Pneumonia, unspecified organism: Secondary | ICD-10-CM | POA: Diagnosis not present

## 2021-08-14 DIAGNOSIS — R0789 Other chest pain: Secondary | ICD-10-CM | POA: Diagnosis present

## 2021-08-14 LAB — CBC
HCT: 40.8 % (ref 36.0–46.0)
Hemoglobin: 12.8 g/dL (ref 12.0–15.0)
MCH: 29.4 pg (ref 26.0–34.0)
MCHC: 31.4 g/dL (ref 30.0–36.0)
MCV: 93.6 fL (ref 80.0–100.0)
Platelets: 274 10*3/uL (ref 150–400)
RBC: 4.36 MIL/uL (ref 3.87–5.11)
RDW: 14.6 % (ref 11.5–15.5)
WBC: 12.2 10*3/uL — ABNORMAL HIGH (ref 4.0–10.5)
nRBC: 0 % (ref 0.0–0.2)

## 2021-08-14 LAB — TROPONIN I (HIGH SENSITIVITY)
Troponin I (High Sensitivity): 7 ng/L (ref <18)
Troponin I (High Sensitivity): 7 ng/L (ref <18)

## 2021-08-14 LAB — BASIC METABOLIC PANEL
Anion gap: 10 (ref 5–15)
BUN: 16 mg/dL (ref 8–23)
CO2: 24 mmol/L (ref 22–32)
Calcium: 9.3 mg/dL (ref 8.9–10.3)
Chloride: 105 mmol/L (ref 98–111)
Creatinine, Ser: 0.84 mg/dL (ref 0.44–1.00)
GFR, Estimated: >60 mL/min (ref >60)
Glucose, Bld: 86 mg/dL (ref 70–99)
Potassium: 3.9 mmol/L (ref 3.5–5.1)
Sodium: 139 mmol/L (ref 135–145)

## 2021-08-14 MED ORDER — AMOXICILLIN-POT CLAVULANATE 875-125 MG PO TABS: 1.0000 | ORAL_TABLET | Freq: Two times a day (BID) | ORAL | 0 refills | Status: DC

## 2021-08-14 MED ORDER — TRAMADOL HCL 50 MG PO TABS
50.0000 mg | ORAL_TABLET | Freq: Once | ORAL | Status: AC
  Administered 2021-08-14: 50 mg via ORAL
  Filled 2021-08-14: qty 1

## 2021-08-14 MED ORDER — TRAMADOL HCL 50 MG PO TABS
50.0000 mg | ORAL_TABLET | Freq: Two times a day (BID) | ORAL | 0 refills | Status: AC | PRN
Start: 2021-08-14 — End: 2021-08-19

## 2021-08-14 MED ORDER — TRAMADOL HCL 50 MG PO TABS: 50.0000 mg | ORAL_TABLET | Freq: Two times a day (BID) | ORAL | 0 refills | Status: DC | PRN

## 2021-08-14 NOTE — ED Notes (Signed)
Reviewed discharge instructions with patient and daughter. Follow-up care and medication reviewed. Patient and daughter verbalized understanding. Patient A&Ox4, VSS upon discharge.

## 2021-08-14 NOTE — ED Triage Notes (Signed)
Pt from [facility] for eval of chest pain with radiation to L arm with sob since last night while lying in bed. 324 ASA given by EMS and refused other medications. Hx CVA with residual dysphagia.

## 2021-08-14 NOTE — Discharge Instructions (Signed)
You have been seen and discharged from the emergency department.  Your x-ray shows pneumonia in the bilateral lower lobes.  Your cardiac work-up was normal.  Take antibiotic as directed.  Use pain medicine as needed.  Do not mix this medication with alcohol or other sedating medications. Do not drive or do heavy physical activity until you know how this medication affects you.  It may cause drowsiness.  Follow-up with your primary provider for further evaluation and further care. Take home medications as prescribed. If you have any worsening symptoms or further concerns for your health please return to an emergency department for further evaluation.

## 2021-08-14 NOTE — ED Provider Notes (Signed)
[name] Provider Note   CSN: [id number] Arrival date & time: 08/14/21  3557     History  Chief Complaint  Patient presents with   Chest Pain    [name] is a 76 y.o. female.  HPI  76 year old female with past medical history of HTN, HLD, previous CVA with residual left-sided deficits and speech difficulty presents emergency department with chest discomfort.  Patient states last night when she laid down she had midsternal chest heaviness.  She has had mild shortness of breath and dry cough.  She states that the pain was improved today but she had another episode when she was laying down so she came in for evaluation.  She is also complaining of left shoulder acute on chronic pain.  The left side has been more immobile since the CVA.  No new injury that she is aware of.  Denies any fever, back pain or lower extremity swelling.  Home Medications Prior to Admission medications   Medication Sig Start Date End Date Taking? Authorizing Provider  acetaminophen (TYLENOL) 325 MG tablet Take 650 mg by mouth at bedtime.   Yes [provider]  acetaminophen (TYLENOL) 500 MG tablet Take 1,000 mg by mouth 2 (two) times daily as needed for moderate pain or mild pain.   Yes [provider]  atorvastatin (LIPITOR) 40 MG tablet Take 1 tablet (40 mg total) by mouth daily. 01/06/20  Yes Lassen, Arlo C, PA-C  betamethasone 0.5% cream-vitamin a&d ointment 1:1 mixture Apply 1 application topically daily as needed (rectal pain).   Yes [provider]  bisacodyl (BISACODYL) 5 MG EC tablet Take 5 mg by mouth daily as needed for mild constipation or moderate constipation.   Yes [provider]  bisacodyl (DULCOLAX) 10 MG suppository If not relieved by MOM, give 10 mg Bisacodyl suppositiory rectally X 1 dose in 24 hours as needed (Do not use constipation standing orders for residents with renal failure/CFR less than 30. Contact  MD for orders) (Physician Order) 11/24/19  Yes [provider]  buPROPion (WELLBUTRIN XL) 150 MG 24 hr tablet Take 150 mg by mouth every morning. 07/29/21  Yes [provider]  clopidogrel (PLAVIX) 75 MG tablet Take 1 tablet (75 mg total) by mouth daily. 01/06/20  Yes Lassen, Arlo C, PA-C  cycloSPORINE (RESTASIS) 0.05 % ophthalmic emulsion Place 1 drop into both eyes 2 (two) times daily.   Yes [provider]  dicyclomine (BENTYL) 10 MG capsule Take 10 mg by mouth 3 (three) times daily as needed for spasms.   Yes [provider]  docusate sodium (COLACE) 100 MG capsule Take 100 mg by mouth daily.   Yes [provider]  escitalopram (LEXAPRO) 10 MG tablet Take 20 mg by mouth daily. In the evening between 4-7 pm   Yes [provider]  magnesium hydroxide (MILK OF MAGNESIA) 400 MG/5ML suspension If no BM in 3 days, give 30 cc Milk of Magnesium p.o. x 1 dose in 24 hours as needed (Do not use standing constipation orders for residents with renal failure CFR less than 30. Contact MD for orders) (Physician Order)   Yes [provider]  melatonin 5 MG TABS Take 5 mg by mouth at bedtime.   Yes [provider]  metoprolol tartrate (LOPRESSOR) 25 MG tablet Take 12.5 mg by mouth 2 (two) times daily.   Yes [provider]  Nutritional Supplements (BOOST PLUS PO) Take 1 Can by mouth daily as  needed (Mid morning, Vanilla).   Yes [provider]  pantoprazole (PROTONIX) 20 MG tablet Take 20 mg by mouth daily.   Yes [provider]  senna (SENOKOT) 8.6 MG TABS tablet Take 2 tablets by mouth at bedtime as needed for mild constipation.   Yes [provider]  simethicone (MYLICON) 80 MG chewable tablet Chew 1 tablet (80 mg total) by mouth every 8 (eight) hours as needed for flatulence. 01/06/20  Yes Lassen, Arlo C, PA-C  Sodium Phosphates (RA SALINE ENEMA RE) If not relieved by Biscodyl suppository, give disposable Saline  Enema rectally X 1 dose/24 hrs as needed (Do not use constipation standing orders for residents with renal failure/CFR less than 30. Contact MD for orders)(Physician Or 11/24/19  Yes [provider]  UNABLE TO FIND Med Name: 1-2-3 Cream; 60 grams; amt: 1 application; topical  Special Instructions: Apply 1-2-3 cream twice daily to buttocks as needed for skin  flare up. Continue to use Desitin cream as a preventative. U   Yes [provider]  amoxicillin-clavulanate (AUGMENTIN) 875-125 MG tablet Take 1 tablet by mouth every 12 (twelve) hours. 08/14/21   Sheresa Cullop, Alvin Critchley, DO  NON FORMULARY Diet: Regular, Chopped/Dysphagia 3   Special Instructions: Aspiration precautions. May have bacon per SLP. 12/01/19   [provider]  traMADol (ULTRAM) 50 MG tablet Take 1 tablet (50 mg total) by mouth every 12 (twelve) hours as needed for up to 5 days. 08/14/21 08/19/21  Llesenia Fogal, Alvin Critchley, DO      Allergies    Chlorhexidine    Review of Systems   Review of Systems  Constitutional:  Positive for fatigue. Negative for fever.  Respiratory:  Positive for cough, chest tightness and shortness of breath.   Cardiovascular:  Negative for chest pain, palpitations and leg swelling.  Gastrointestinal:  Negative for abdominal pain, diarrhea and vomiting.  Skin:  Negative for rash.  Neurological:  Negative for headaches.   Physical Exam Updated Vital Signs BP (!) 166/90    Pulse 71    Temp 98.7 F (37.1 C) (Oral)    Resp 20    Ht 5\' 2"  (1.575 m)    Wt 57.2 kg    LMP 07/23/1998 (Approximate)    SpO2 98%    BMI 23.05 kg/m  Physical Exam Vitals and nursing note reviewed.  Constitutional:      General: She is not in acute distress.    Appearance: Normal appearance. She is not diaphoretic.  HENT:     Head: Normocephalic.     Mouth/Throat:     Mouth: Mucous membranes are moist.  Cardiovascular:     Rate and Rhythm: Normal rate.  Pulmonary:     Effort: Pulmonary effort is normal. No  tachypnea or respiratory distress.     Breath sounds: Examination of the right-lower field reveals decreased breath sounds. Examination of the left-lower field reveals decreased breath sounds. Decreased breath sounds present.  Abdominal:     Palpations: Abdomen is soft.     Tenderness: There is no abdominal tenderness.  Skin:    General: Skin is warm.  Neurological:     Mental Status: She is alert and oriented to person, place, and time. Mental status is at baseline.  Psychiatric:        Mood and Affect: Mood normal.    ED Results / Procedures / Treatments   Labs (all labs ordered are listed, but only abnormal results are displayed) Labs Reviewed  CBC - Abnormal; Notable for  the following components:      Result Value   WBC 12.2 (*)    All other components within normal limits  BASIC METABOLIC PANEL  TROPONIN I (HIGH SENSITIVITY)  TROPONIN I (HIGH SENSITIVITY)    EKG EKG Interpretation  Date/Time:  Monday August 14 2021 09:34:00 EST Ventricular Rate:  60 PR Interval:  182 QRS Duration: 102 QT Interval:  424 QTC Calculation: 424 R Axis:   -57 Text Interpretation: Normal sinus rhythm Left axis deviation Moderate voltage criteria for LVH, may be normal variant ( R in aVL , Cornell product ) Abnormal ECG When compared with ECG of 10-Nov-2019 09:47, PREVIOUS ECG IS PRESENT Confirmed by Lavenia Atlas [phone]) on 08/14/2021 3:44:21 PM  Radiology DG Chest 2 View  Result Date: 08/14/2021 CLINICAL DATA:  Chest pain and left shoulder pain beginning last night. EXAM: CHEST - 2 VIEW COMPARISON:  11/13/2019 FINDINGS: Heart size is normal. Mild aortic atherosclerotic calcification is present. There is patchy density at both lung bases, left worse than right, consistent with bibasilar pneumonia. No dense consolidation or lobar collapse. No visible effusion. There is an old compression deformity noted at T11 or T12. IMPRESSION: Patchy density at both lung bases left worse than right  consistent with bibasilar pneumonia. Electronically Signed   By: Nelson Chimes M.D.   On: 08/14/2021 10:28   DG Shoulder Left  Result Date: 08/14/2021 CLINICAL DATA:  Left chest pain and shoulder pain beginning last night. EXAM: LEFT SHOULDER - 2+ VIEW COMPARISON:  None. FINDINGS: There is no evidence of fracture or dislocation. There is no evidence of arthropathy or other focal bone abnormality. Soft tissues are unremarkable. IMPRESSION: Negative. Electronically Signed   By: Nelson Chimes M.D.   On: 08/14/2021 10:29    Procedures Procedures    Medications Ordered in ED Medications  traMADol (ULTRAM) tablet 50 mg (50 mg Oral Given 08/14/21 1701)    ED Course/ Medical Decision Making/ A&P                           Medical Decision Making Amount and/or Complexity of Data Reviewed Labs: ordered. Radiology: ordered.  Risk Prescription drug management.   This patient presents to the ED for concern of chest pain, this involves an extensive number of treatment options, and is a complaint that carries with it a high risk of complications and morbidity.  The differential diagnosis includes ACS, PE/dissection, pneumonia, musculoskeletal pain   Additional history obtained: -Additional history obtained from daughter at bedside and facility notes -External records from outside source obtained and reviewed including: Chart review including previous notes, labs, imaging, consultation notes   Lab Tests: -I ordered, reviewed, and interpreted labs.  The pertinent results include: Mild leukocytosis but otherwise normal labs, negative troponins   EKG -No acute ischemic changes   Imaging Studies ordered: -I ordered imaging studies including left shoulder x-ray and chest x-ray -I independently visualized and interpreted imaging which showed no acute findings in the left shoulder, bibasilar pneumonia on the chest x-ray -I agree with the radiologist interpretation   Medicines ordered and  prescription drug management: -I ordered medication including tramadol for chest and shoulder discomfort -Reevaluation of the patient after these medicines showed that the patient improved -I have reviewed the patients home medicines and have made adjustments as needed   ED Course: 76 year old female presents emergency department with 2 episodes of chest heaviness, shortness of breath and dry cough.  Mild chest discomfort with  deep inspiration.  No radiation to the back or abdomen.  Equal pulses in the upper extremities.  Diminished breath sounds at bases.  She is afebrile.  Has a mild leukocytosis on blood work.  Reports a recent flu/COVID swab that was negative.  Would find these chest x-ray findings atypical for flu/COVID.  Cardiac work-up is negative, normal troponins and no delta.  Chest pain remains pleuritic and respiratory related.  Most likely from pneumonia findings on chest x-ray.  Plan for pain control, outpatient antibiotics and discharged back to facility.   Cardiac Monitoring: The patient was maintained on a cardiac monitor.  I personally viewed and interpreted the cardiac monitored which showed an underlying rhythm of: Sinus rhythm   Reevaluation: After the interventions noted above, I reevaluated the patient and found that they have :improved   Dispostion: Patient at this time appears safe and stable for discharge and close outpatient follow up. Discharge plan and strict return to ED precautions discussed, patient verbalizes understanding and agreement.  Recommend repeat flu/COVID swab at facility.        Final Clinical Impression(s) / ED Diagnoses Final diagnoses:  Community acquired pneumonia, unspecified laterality    Rx / DC Orders ED Discharge Orders          Ordered    amoxicillin-clavulanate (AUGMENTIN) 875-125 MG tablet  Every 12 hours,   Status:  Discontinued        08/14/21 1728    traMADol (ULTRAM) 50 MG tablet  Every 12 hours PRN,   Status:   Discontinued        08/14/21 1728    amoxicillin-clavulanate (AUGMENTIN) 875-125 MG tablet  Every 12 hours        08/14/21 1747    traMADol (ULTRAM) 50 MG tablet  Every 12 hours PRN        08/14/21 1748              Lorelle Gibbs, DO 08/14/21 1806

## 2021-08-14 NOTE — ED Provider Triage Note (Signed)
Emergency Medicine Provider Triage Evaluation Note  [name] , a 76 y.o. female  was evaluated in triage.  Pt complains of chest pain.   She started having chest pain with shob while laying in bed last night.  She was given asa by ems but refused all other meds. Pain radiates into the right arm.  She says the pain is worse when she moves and rolls over.  Review of Systems  Positive:  Negative: See above  Physical Exam  LMP 07/23/1998 (Approximate)  Gen:   Awake, no distress   Resp:  Normal effort  MSK:   Left sided weakness Other:  Slow stuttering speech. RRR  Medical Decision Making  Medically screening exam initiated at 9:36 AM.  Appropriate orders placed.  Kamiya Acord Bee was informed that the remainder of the evaluation will be completed by another provider, this initial triage assessment does not replace that evaluation, and the importance of remaining in the ED until their evaluation is complete.     Lorin Glass, [place] 08/14/21 [phone]

## 2021-08-28 ENCOUNTER — Non-Acute Institutional Stay: Payer: Medicare Other | Admitting: Adult Health

## 2021-08-28 ENCOUNTER — Encounter: Payer: Self-pay | Admitting: Adult Health

## 2021-08-28 ENCOUNTER — Other Ambulatory Visit: Payer: Self-pay

## 2021-08-28 VITALS — BP 136/92 | HR 63 | Temp 97.9°F | Ht 62.0 in | Wt 132.6 lb

## 2021-08-28 DIAGNOSIS — I69391 Dysphagia following cerebral infarction: Secondary | ICD-10-CM | POA: Diagnosis not present

## 2021-08-28 DIAGNOSIS — K219 Gastro-esophageal reflux disease without esophagitis: Secondary | ICD-10-CM

## 2021-08-28 DIAGNOSIS — I10 Essential (primary) hypertension: Secondary | ICD-10-CM

## 2021-08-28 DIAGNOSIS — I69354 Hemiplegia and hemiparesis following cerebral infarction affecting left non-dominant side: Secondary | ICD-10-CM

## 2021-08-28 DIAGNOSIS — F329 Major depressive disorder, single episode, unspecified: Secondary | ICD-10-CM

## 2021-08-28 DIAGNOSIS — J189 Pneumonia, unspecified organism: Secondary | ICD-10-CM | POA: Diagnosis not present

## 2021-08-28 DIAGNOSIS — I700 Atherosclerosis of aorta: Secondary | ICD-10-CM

## 2021-08-28 DIAGNOSIS — R2689 Other abnormalities of gait and mobility: Secondary | ICD-10-CM

## 2021-08-28 DIAGNOSIS — H6123 Impacted cerumen, bilateral: Secondary | ICD-10-CM

## 2021-08-28 NOTE — Progress Notes (Signed)
Location:  [facility]  POS: clinic  Provider:  Cindi Carbon, [place] [phone]   Code Status: DNR Goals of Care:  Advanced Directives 08/28/2021  Does Patient Have a Medical Advance Directive? Yes  Type of Paramedic of [place];Living will;Out of facility DNR (pink MOST or yellow form)  Does patient want to make changes to medical advance directive? No - Patient declined  Copy of [place] in Chart? -  Would patient like information on creating a medical advance directive? -  Pre-existing out of facility DNR order (yellow form or pink MOST form) -     Chief Complaint  Patient presents with   Medical Management of Chronic Issues    Patient returns to the clinic for 3 month follow up.     HPI: Patient is a 77 y.o. female seen today for medical management of chronic diseases.  PMH CVA with left sided weakness 10/2019, als oHTN, MCI, hearing loss, osteoporosis, OA, Raynauds, depression, HLD  Feels like she does not sleeping well sometimes. Takes melatonin. She is on wellbutrin and lexapro and sees a psychiatry. She doesn't think it has helped thus far. No panic attacks. No SI or HI.  02/07/21 MMSE 29/30  Continues on D3 diet due to prior hx of dysphagia. She does oral exercises She said she is continent of bowel and bladder. She is still adjusting to moving to enhanced AL from skilled. She is able to ambulate short distances and transfer to her bed or chair.   Seen in the ED 08/14/21 with chest pain rule out for a cardiac event, and diagnosed with bilateral pna. She was placed on augmentin. Symptoms now resolved.  Past Medical History:  Diagnosis Date   Anxiety    Aortic atherosclerosis (HCC)    Arthritis    Complication of anesthesia    per pt, slow to wake up past sedation.   CVA (cerebral vascular accident) ([place]) 11/10/2019   Depression    Diverticulosis    Hiatal hernia    Hyperplastic colon polyp     Hypertension    Neuropathy    Osteoarthritis    Osteoporosis    Renal mass    Stomach upset    has a senstive stomach   Throat clearing    has a cough at times   Urinary incontinence    Vitamin D deficiency     Past Surgical History:  Procedure Laterality Date   COLONOSCOPY     FACIAL COSMETIC SURGERY     over 15 years ago   FINGER TENDON REPAIR     middle rt hand-   HAMMER TOE SURGERY  12/27/2011   Procedure: HAMMER TOE CORRECTION;  Surgeon: Wylene Simmer, MD;  Location: [facility];  Service: Orthopedics;  Laterality: Left;  2-4th    Allergies  Allergen Reactions   Chlorhexidine     Outpatient Encounter Medications as of 08/28/2021  Medication Sig   acetaminophen (TYLENOL) 325 MG tablet Take 650 mg by mouth at bedtime.   acetaminophen (TYLENOL) 500 MG tablet Take 1,000 mg by mouth 2 (two) times daily as needed for moderate pain or mild pain.   atorvastatin (LIPITOR) 40 MG tablet Take 1 tablet (40 mg total) by mouth daily.   betamethasone 0.5% cream-vitamin a&d ointment 1:1 mixture Apply 1 application topically daily as needed (rectal pain).   bisacodyl (BISACODYL) 5 MG EC tablet Take 5 mg by mouth daily as needed for mild constipation or  moderate constipation.   bisacodyl (DULCOLAX) 10 MG suppository If not relieved by MOM, give 10 mg Bisacodyl suppositiory rectally X 1 dose in 24 hours as needed (Do not use constipation standing orders for residents with renal failure/CFR less than 30. Contact MD for orders) (Physician Order)   buPROPion (WELLBUTRIN XL) 150 MG 24 hr tablet Take 150 mg by mouth every morning.   clopidogrel (PLAVIX) 75 MG tablet Take 1 tablet (75 mg total) by mouth daily.   cycloSPORINE (RESTASIS) 0.05 % ophthalmic emulsion Place 1 drop into both eyes 2 (two) times daily.   dicyclomine (BENTYL) 10 MG capsule Take 10 mg by mouth 3 (three) times daily as needed for spasms.   docusate sodium (COLACE) 100 MG capsule Take 100 mg by mouth daily.    escitalopram (LEXAPRO) 10 MG tablet Take 20 mg by mouth daily. In the evening between 4-7 pm   magnesium hydroxide (MILK OF MAGNESIA) 400 MG/5ML suspension If no BM in 3 days, give 30 cc Milk of Magnesium p.o. x 1 dose in 24 hours as needed (Do not use standing constipation orders for residents with renal failure CFR less than 30. Contact MD for orders) (Physician Order)   melatonin 5 MG TABS Take 5 mg by mouth at bedtime.   metoprolol tartrate (LOPRESSOR) 25 MG tablet Take 12.5 mg by mouth 2 (two) times daily.   NON FORMULARY Diet: Regular, Chopped/Dysphagia 3   Special Instructions: Aspiration precautions. May have bacon per SLP.   Nutritional Supplements (BOOST PLUS PO) Take 1 Can by mouth daily as needed (Mid morning, Vanilla).   pantoprazole (PROTONIX) 20 MG tablet Take 20 mg by mouth daily.   senna (SENOKOT) 8.6 MG TABS tablet Take 2 tablets by mouth at bedtime as needed for mild constipation.   simethicone (MYLICON) 80 MG chewable tablet Chew 1 tablet (80 mg total) by mouth every 8 (eight) hours as needed for flatulence.   Sodium Phosphates (RA SALINE ENEMA RE) If not relieved by Biscodyl suppository, give disposable Saline Enema rectally X 1 dose/24 hrs as needed (Do not use constipation standing orders for residents with renal failure/CFR less than 30. Contact MD for orders)(Physician Or   UNABLE TO FIND Med Name: 1-2-3 Cream; 60 grams; amt: 1 application; topical  Special Instructions: Apply 1-2-3 cream twice daily to buttocks as needed for skin  flare up. Continue to use Desitin cream as a preventative. U   [DISCONTINUED] amoxicillin-clavulanate (AUGMENTIN) 875-125 MG tablet Take 1 tablet by mouth every 12 (twelve) hours.   No facility-administered encounter medications on file as of 08/28/2021.    Review of Systems:  Review of Systems  Constitutional:  Negative for activity change, appetite change, chills, diaphoresis, fatigue, fever and unexpected weight change.  HENT:  Negative for  congestion.   Respiratory:  Negative for cough, shortness of breath and wheezing.   Cardiovascular:  Negative for chest pain, palpitations and leg swelling.  Gastrointestinal:  Negative for abdominal distention, abdominal pain, constipation and diarrhea.  Genitourinary:  Negative for difficulty urinating and dysuria.  Musculoskeletal:  Positive for gait problem. Negative for arthralgias, back pain, joint swelling and myalgias.  Neurological:  Positive for speech difficulty and weakness. Negative for dizziness, tremors, seizures, syncope, facial asymmetry, light-headedness, numbness and headaches.  Psychiatric/Behavioral:  Positive for dysphoric mood and sleep disturbance. Negative for agitation, behavioral problems and confusion. The patient is nervous/anxious.    Health Maintenance  Topic Date Due   TETANUS/TDAP  01/24/2031   Pneumonia Vaccine 74+ Years old  Completed   INFLUENZA VACCINE  Completed   COVID-19 Vaccine  Completed   Hepatitis C Screening  Completed   Zoster Vaccines- Shingrix  Completed   HPV VACCINES  Aged Out   DEXA SCAN  Discontinued   COLONOSCOPY (Pts 45-80yrs Insurance coverage will need to be confirmed)  Discontinued    Physical Exam: Vitals:   08/28/21 1433  BP: (!) 136/92  Pulse: 63  Temp: 97.9 F (36.6 C)  SpO2: 96%  Weight: 132 lb 9.6 oz (60.1 kg)  Height: 5\' 2"  (1.575 m)   Body mass index is 24.25 kg/m. Physical Exam Vitals and nursing note reviewed.  Constitutional:      General: She is not in acute distress.    Appearance: She is not diaphoretic.  HENT:     Head: Normocephalic and atraumatic.     Right Ear: There is impacted cerumen.     Left Ear: There is impacted cerumen.     Nose: Nose normal.     Mouth/Throat:     Mouth: Mucous membranes are moist.     Pharynx: Oropharynx is clear.  Eyes:     Conjunctiva/sclera: Conjunctivae normal.     Pupils: Pupils are equal, round, and reactive to light.  Neck:     Vascular: No JVD.   Cardiovascular:     Rate and Rhythm: Normal rate and regular rhythm.     Heart sounds: No murmur heard. Pulmonary:     Effort: Pulmonary effort is normal. No respiratory distress.     Breath sounds: Normal breath sounds. No wheezing.  Abdominal:     General: Abdomen is flat. Bowel sounds are normal. There is no distension.     Palpations: Abdomen is soft.     Tenderness: There is no abdominal tenderness.  Musculoskeletal:     Right lower leg: No edema.     Left lower leg: No edema.  Skin:    General: Skin is warm and dry.  Neurological:     Mental Status: She is alert and oriented to person, place, and time.     Comments: Speech is mildly dysarthric and slow. Has left side weakness   Psychiatric:        Mood and Affect: Mood normal.    Labs reviewed: Basic Metabolic Panel: Recent Labs    01/24/21 0000 06/01/21 0000 08/14/21 0942  NA 141 144 139  K 3.9 4.2 3.9  CL 106 109* 105  CO2 24* 25* 24  GLUCOSE  --   --  86  BUN 19 30* 16  CREATININE 0.6 0.7 0.84  CALCIUM 9.7 9.7 9.3   Liver Function Tests: Recent Labs    01/24/21 0000 06/01/21 0000 06/02/21 0000  AST 14  --  18  ALT 24  --  23  ALKPHOS 112  --  79  ALBUMIN 3.9 4.1  --    No results for input(s): LIPASE, AMYLASE in the last 8760 hours. No results for input(s): AMMONIA in the last 8760 hours. CBC: Recent Labs    01/24/21 0000 06/01/21 0000 08/14/21 0942  WBC 7.0 13.6 12.2*  NEUTROABS  --  11.60  --   HGB 14.2 13.9 12.8  HCT 43 43 40.8  MCV  --   --  93.6  PLT 236 280 274   Lipid Panel: No results for input(s): CHOL, HDL, LDLCALC, TRIG, CHOLHDL, LDLDIRECT in the last 8760 hours. Lab Results  Component Value Date   HGBA1C 5.1 11/11/2019    Procedures since last  visit: DG Chest 2 View  Result Date: 08/14/2021 CLINICAL DATA:  Chest pain and left shoulder pain beginning last night. EXAM: CHEST - 2 VIEW COMPARISON:  11/13/2019 FINDINGS: Heart size is normal. Mild aortic atherosclerotic  calcification is present. There is patchy density at both lung bases, left worse than right, consistent with bibasilar pneumonia. No dense consolidation or lobar collapse. No visible effusion. There is an old compression deformity noted at T11 or T12. IMPRESSION: Patchy density at both lung bases left worse than right consistent with bibasilar pneumonia. Electronically Signed   By: Nelson Chimes M.D.   On: 08/14/2021 10:28   DG Shoulder Left  Result Date: 08/14/2021 CLINICAL DATA:  Left chest pain and shoulder pain beginning last night. EXAM: LEFT SHOULDER - 2+ VIEW COMPARISON:  None. FINDINGS: There is no evidence of fracture or dislocation. There is no evidence of arthropathy or other focal bone abnormality. Soft tissues are unremarkable. IMPRESSION: Negative. Electronically Signed   By: Nelson Chimes M.D.   On: 08/14/2021 10:29    Assessment/Plan  1. Community acquired pneumonia, unspecified laterality Symptom resolved F/u xray next week   2. 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 Dysphagia following cerebrovascular accident (CVA) Continue diet modification and exercises  4. Aortic atherosclerosis ([place]) Noted on CT in 2021, on plavix and statin  5. Primary hypertension Controlled with metoprolol  6. Reactive depression With anxiety Doesn't feel the current regimen is helping Encouraged her to f/u with geropsych and give the meds more time to work  7. Abnormality of gait due to impairment of balance Doing well in enhanced AL  8. Gastroesophageal reflux disease without esophagitis No current symptoms On Protonix  9. Cerumen impaction Initiate cerumen impaction protocol to both ears  D/c cream that she is no longer using per her request  Total time 75min  time greater than 50% of total time spent doing pt counseling  and coordination of care       Labs/tests ordered:  * No order type specified * CBC BMP TSH Lipid prior to apt Next appt:  Dr Lyndel Safe 3 months

## 2021-08-29 ENCOUNTER — Encounter: Payer: Self-pay | Admitting: Adult Health

## 2021-11-28 LAB — TSH: TSH: 1.68 (ref 0.41–5.90)

## 2021-11-28 LAB — COMPREHENSIVE METABOLIC PANEL: Calcium: 9.6 (ref 8.7–10.7)

## 2021-11-28 LAB — CBC AND DIFFERENTIAL
HCT: 38 (ref 36–46)
Hemoglobin: 12.1 (ref 12.0–16.0)
Platelets: 307 10*3/uL (ref 150–400)
WBC: 8

## 2021-11-28 LAB — BASIC METABOLIC PANEL
BUN: 14 (ref 4–21)
CO2: 22 (ref 13–22)
Chloride: 108 (ref 99–108)
Creatinine: 0.8 (ref 0.5–1.1)
Glucose: 73
Potassium: 4.3 mEq/L (ref 3.5–5.1)
Sodium: 143 (ref 137–147)

## 2021-11-28 LAB — LIPID PANEL
Cholesterol: 151 (ref 0–200)
HDL: 65 (ref 35–70)
LDL Cholesterol: 71
LDl/HDL Ratio: 2.3
Triglycerides: 77 (ref 40–160)

## 2021-11-28 LAB — CBC: RBC: 4.53 (ref 3.87–5.11)

## 2021-11-29 ENCOUNTER — Encounter: Payer: Self-pay | Admitting: Internal Medicine

## 2021-11-29 ENCOUNTER — Non-Acute Institutional Stay: Payer: Medicare Other | Admitting: Internal Medicine

## 2021-11-29 VITALS — BP 128/92 | HR 60 | Temp 97.7°F | Ht 62.0 in | Wt 131.6 lb

## 2021-11-29 DIAGNOSIS — R2689 Other abnormalities of gait and mobility: Secondary | ICD-10-CM

## 2021-11-29 DIAGNOSIS — I69354 Hemiplegia and hemiparesis following cerebral infarction affecting left non-dominant side: Secondary | ICD-10-CM

## 2021-11-29 DIAGNOSIS — I10 Essential (primary) hypertension: Secondary | ICD-10-CM

## 2021-11-29 DIAGNOSIS — F329 Major depressive disorder, single episode, unspecified: Secondary | ICD-10-CM | POA: Diagnosis not present

## 2021-11-29 DIAGNOSIS — I69391 Dysphagia following cerebral infarction: Secondary | ICD-10-CM | POA: Diagnosis not present

## 2021-11-29 DIAGNOSIS — K5901 Slow transit constipation: Secondary | ICD-10-CM

## 2021-11-29 DIAGNOSIS — K219 Gastro-esophageal reflux disease without esophagitis: Secondary | ICD-10-CM

## 2021-11-29 NOTE — Progress Notes (Signed)
? ?Location:  [facility] ?  ?Place of Service:  Clinic (12) ? ?Provider:  ? ?Code Status: DNR ?Goals of Care:  ? ?  11/29/2021  ?  9:48 AM  ?Advanced Directives  ?Does Patient Have a Medical Advance Directive? Yes  ?Type of Advance Directive Out of facility DNR (pink MOST or yellow form);Healthcare Power of Attorney  ?Does patient want to make changes to medical advance directive? No - Patient declined  ?Copy of [place] in Chart? Yes - validated most recent copy scanned in chart (See row information)  ?Pre-existing out of facility DNR order (yellow form or pink MOST form) Pink MOST form placed in chart (order not valid for inpatient use);Yellow form placed in chart (order not valid for inpatient use)  ? ? ? ?Chief Complaint  ?Patient presents with  ? Medical Management of Chronic Issues  ?  Patient returns to the clinic for her 3 month follow up.   ? ? ?HPI: Patient is a 76 y.o. female seen today for medical management of chronic diseases.   ? ?Patient has a history of hypertension, hyperlipidemia, depression and anxiety, osteoporosis ?  bilateral CVAs left greater than right in 2021 ?Followed By Dysphagia , Dysarthria,  ? ?Lives in Enhanced AL ?Needs some help with her ADLS ?Mild assist with transfers. Uses Her Power chair  ?Cognitively doing better ?Had no new complains today ?Mood seems better ? ?Wt Readings from Last 3 Encounters:  ?11/29/21 131 lb 9.6 oz (59.7 kg)  ?08/28/21 132 lb 9.6 oz (60.1 kg)  ?08/14/21 126 lb (57.2 kg)  ?  ?Sister in [place] and is POA ?Son in [place] ? ?Past Medical History:  ?Diagnosis Date  ? Anxiety   ? Aortic atherosclerosis ([place])   ? Arthritis   ? Complication of anesthesia   ? per pt, slow to wake up past sedation.  ? CVA (cerebral vascular accident) ([place]) 11/10/2019  ? Depression   ? Diverticulosis   ? Hiatal hernia   ? Hyperplastic colon polyp   ? Hypertension   ? Neuropathy   ? Osteoarthritis   ? Osteoporosis   ? Renal mass   ?  Stomach upset   ? has a senstive stomach  ? Throat clearing   ? has a cough at times  ? Urinary incontinence   ? Vitamin D deficiency   ? ? ?Past Surgical History:  ?Procedure Laterality Date  ? COLONOSCOPY    ? FACIAL COSMETIC SURGERY    ? over 15 years ago  ? FINGER TENDON REPAIR    ? middle rt hand-  ? HAMMER TOE SURGERY  12/27/2011  ? Procedure: HAMMER TOE CORRECTION;  Surgeon: Wylene Simmer, MD;  Location: [facility];  Service: Orthopedics;  Laterality: Left;  2-4th  ? ? ?Allergies  ?Allergen Reactions  ? Chlorhexidine   ? ? ?Outpatient Encounter Medications as of 11/29/2021  ?Medication Sig  ? acetaminophen (TYLENOL) 325 MG tablet Take 650 mg by mouth at bedtime.  ? acetaminophen (TYLENOL) 500 MG tablet Take 1,000 mg by mouth 2 (two) times daily as needed for moderate pain or mild pain.  ? atorvastatin (LIPITOR) 40 MG tablet Take 1 tablet (40 mg total) by mouth daily.  ? betamethasone 0.5% cream-vitamin a&d ointment 1:1 mixture Apply 1 application topically daily as needed (rectal pain).  ? bisacodyl (BISACODYL) 5 MG EC tablet Take 5 mg by mouth daily as needed for mild constipation or moderate constipation.  ? bisacodyl (DULCOLAX) 10  MG suppository If not relieved by MOM, give 10 mg Bisacodyl suppositiory rectally X 1 dose in 24 hours as needed (Do not use constipation standing orders for residents with renal failure/CFR less than 30. Contact MD for orders) (Physician Order)  ? buPROPion (WELLBUTRIN XL) 150 MG 24 hr tablet Take 150 mg by mouth every morning.  ? clopidogrel (PLAVIX) 75 MG tablet Take 1 tablet (75 mg total) by mouth daily.  ? cycloSPORINE (RESTASIS) 0.05 % ophthalmic emulsion Place 1 drop into both eyes 2 (two) times daily.  ? dicyclomine (BENTYL) 10 MG capsule Take 10 mg by mouth 3 (three) times daily as needed for spasms.  ? docusate sodium (COLACE) 100 MG capsule Take 100 mg by mouth daily.  ? escitalopram (LEXAPRO) 10 MG tablet Take 20 mg by mouth daily. In the evening between 4-7  pm  ? magnesium hydroxide (MILK OF MAGNESIA) 400 MG/5ML suspension If no BM in 3 days, give 30 cc Milk of Magnesium p.o. x 1 dose in 24 hours as needed (Do not use standing constipation orders for residents with renal failure CFR less than 30. Contact MD for orders) (Physician Order)  ? melatonin 5 MG TABS Take 5 mg by mouth at bedtime.  ? metoprolol tartrate (LOPRESSOR) 25 MG tablet Take 12.5 mg by mouth 2 (two) times daily.  ? NON FORMULARY Diet: Regular, Chopped/Dysphagia 3  ? ?Special Instructions: Aspiration precautions. May have bacon per SLP.  ? Nutritional Supplements (BOOST PLUS PO) Take 1 Can by mouth daily as needed (Mid morning, Vanilla).  ? pantoprazole (PROTONIX) 20 MG tablet Take 20 mg by mouth daily.  ? senna (SENOKOT) 8.6 MG TABS tablet Take 2 tablets by mouth at bedtime as needed for mild constipation.  ? simethicone (MYLICON) 80 MG chewable tablet Chew 1 tablet (80 mg total) by mouth every 8 (eight) hours as needed for flatulence.  ? Sodium Phosphates (RA SALINE ENEMA RE) If not relieved by Biscodyl suppository, give disposable Saline Enema rectally X 1 dose/24 hrs as needed (Do not use constipation standing orders for residents with renal failure/CFR less than 30. Contact MD for orders)(Physician Or  ? UNABLE TO FIND Med Name: 1-2-3 Cream; 60 grams; amt: 1 application; topical  ?Special Instructions: Apply 1-2-3 cream twice daily to buttocks as needed for skin  ?flare up. Continue to use Desitin cream as a preventative. U  ? ?No facility-administered encounter medications on file as of 11/29/2021.  ? ? ?Review of Systems:  ?Review of Systems  ?Constitutional:  Negative for activity change and appetite change.  ?HENT: Negative.    ?Respiratory:  Negative for cough and shortness of breath.   ?Cardiovascular:  Negative for leg swelling.  ?Gastrointestinal:  Positive for constipation.  ?Genitourinary: Negative.   ?Musculoskeletal:  Positive for gait problem. Negative for arthralgias and myalgias.   ?Skin: Negative.   ?Neurological:  Negative for dizziness and weakness.  ?Psychiatric/Behavioral:  Negative for confusion, dysphoric mood and sleep disturbance.   ? ?Health Maintenance  ?Topic Date Due  ? INFLUENZA VACCINE  02/20/2022  ? TETANUS/TDAP  01/24/2031  ? Pneumonia Vaccine 43+ Years old  Completed  ? COVID-19 Vaccine  Completed  ? Hepatitis C Screening  Completed  ? Zoster Vaccines- Shingrix  Completed  ? HPV VACCINES  Aged Out  ? DEXA SCAN  Discontinued  ? COLONOSCOPY (Pts 45-51yr Insurance coverage will need to be confirmed)  Discontinued  ? ? ?Physical Exam: ?Vitals:  ? 11/29/21 1026  ?BP: (!) 128/92  ?Pulse: 60  ?  Temp: 97.7 ?F (36.5 ?C)  ?SpO2: 90%  ?Weight: 131 lb 9.6 oz (59.7 kg)  ?Height: '5\' 2"'$  (1.575 m)  ? ?Body mass index is 24.07 kg/m?[place] Kitchen ?Physical Exam ?Vitals reviewed.  ?Constitutional:   ?   Appearance: Normal appearance.  ?HENT:  ?   Head: Normocephalic.  ?   Nose: Nose normal.  ?   Mouth/Throat:  ?   Mouth: Mucous membranes are moist.  ?   Pharynx: Oropharynx is clear.  ?Eyes:  ?   Pupils: Pupils are equal, round, and reactive to light.  ?Cardiovascular:  ?   Rate and Rhythm: Normal rate and regular rhythm.  ?   Pulses: Normal pulses.  ?   Heart sounds: Normal heart sounds. No murmur heard. ?Pulmonary:  ?   Effort: Pulmonary effort is normal.  ?   Breath sounds: Normal breath sounds.  ?Abdominal:  ?   General: Abdomen is flat. Bowel sounds are normal.  ?   Palpations: Abdomen is soft.  ?Musculoskeletal:     ?   General: No swelling.  ?   Cervical back: Neck supple.  ?Skin: ?   General: Skin is warm.  ?Neurological:  ?   Mental Status: She is alert.  ?   Comments: Has Mild weakness in all extremities  ?Psychiatric:     ?   Mood and Affect: Mood normal.     ?   Thought Content: Thought content normal.  ? ? ?Labs reviewed: ?Basic Metabolic Panel: ?Recent Labs  ?  06/01/21 ?0000 08/14/21 ?0942 11/28/21 ?0000  ?NA 144 139 143  ?K 4.2 3.9 4.3  ?CL 109* 105 108  ?CO2 25* 24 22  ?GLUCOSE  --  86  --    ?BUN 30* 16 14  ?CREATININE 0.7 0.84 0.8  ?CALCIUM 9.7 9.3 9.6  ?TSH  --   --  1.68  ? ?Liver Function Tests: ?Recent Labs  ?  01/24/21 ?0000 06/01/21 ?0000 06/02/21 ?0000  ?AST 14  --  18  ?ALT 24  --  2

## 2021-11-30 ENCOUNTER — Encounter: Payer: Medicare Other | Admitting: Orthopedic Surgery

## 2021-12-15 ENCOUNTER — Encounter: Payer: Self-pay | Admitting: Adult Health

## 2021-12-15 ENCOUNTER — Non-Acute Institutional Stay: Payer: Medicare Other | Admitting: Adult Health

## 2021-12-15 ENCOUNTER — Encounter: Payer: Self-pay | Admitting: Internal Medicine

## 2021-12-15 DIAGNOSIS — M25562 Pain in left knee: Secondary | ICD-10-CM

## 2021-12-15 DIAGNOSIS — W19XXXA Unspecified fall, initial encounter: Secondary | ICD-10-CM | POA: Diagnosis not present

## 2021-12-15 MED ORDER — TRAMADOL HCL 50 MG PO TABS: 50.0000 mg | ORAL_TABLET | Freq: Three times a day (TID) | ORAL | 0 refills | Status: AC | PRN

## 2021-12-15 NOTE — Progress Notes (Signed)
This encounter was created in error - please disregard.

## 2021-12-15 NOTE — Progress Notes (Addendum)
Location:   [facility] Room Number: 397 Place of Service:  ALF [phone]) Provider:  Royal Hawthorn, NP  Lynn Dad, MD  Patient Care Team: Lynn Dad, MD as PCP - General (Internal Medicine) Wylene Simmer, MD as Consulting Physician (Orthopedic Surgery) Cameron Sprang, MD as Consulting Physician (Neurology)  Extended Emergency Contact Information Primary Emergency Contact: Lynn [name] Address: [address]          [place], [place] [address] Johnnette Litter of [place] Mobile Phone: [phone] Relation: Sister Interpreter needed? No Secondary Emergency Contact: Duren,Jim  [place] [employer] Phone: [phone] Relation: Brother  Code Status:  DNR Goals of care: Advanced Directive information    12/15/2021    9:44 AM  Advanced Directives  Does Patient Have a Medical Advance Directive? Yes  Type of Advance Directive Out of facility DNR (pink MOST or yellow form);Healthcare Power of Attorney  Does patient want to make changes to medical advance directive? No - Patient declined  Copy of [place] in Chart? Yes - validated most recent copy scanned in chart (See row information)  Pre-existing out of facility DNR order (yellow form or pink MOST form) Pink MOST form placed in chart (order not valid for inpatient use);Yellow form placed in chart (order not valid for inpatient use)     Chief Complaint  Patient presents with   Acute Visit    Fall    HPI:  Pt is a 76 y.o. female seen today for an acute visit for a fall. [name] fell last evening transferring from the shower. She did not hit her head. Did not pass out or feel dizzy. The fall was mechanical in nature. She went slid down in the shower and missed the [facility] and her left knee went under her and now has some pain with extension and with bearing weight. There is no bruising or deformity. Some swelling is noted. She is taking tylenol with little  relief. Is using ice. Xray ordered and pending.    Past Medical History:  Diagnosis Date   Anxiety    Aortic atherosclerosis (HCC)    Arthritis    Complication of anesthesia    per pt, slow to wake up past sedation.   CVA (cerebral vascular accident) ([place]) 11/10/2019   Depression    Diverticulosis    Hiatal hernia    Hyperplastic colon polyp    Hypertension    Neuropathy    Osteoarthritis    Osteoporosis    Renal mass    Stomach upset    has a senstive stomach   Throat clearing    has a cough at times   Urinary incontinence    Vitamin D deficiency    Past Surgical History:  Procedure Laterality Date   COLONOSCOPY     FACIAL COSMETIC SURGERY     over 15 years ago   FINGER TENDON REPAIR     middle rt hand-   HAMMER TOE SURGERY  12/27/2011   Procedure: HAMMER TOE CORRECTION;  Surgeon: Wylene Simmer, MD;  Location: [facility];  Service: Orthopedics;  Laterality: Left;  2-4th    Allergies  Allergen Reactions   Chlorhexidine     Allergies as of 12/15/2021       Reactions   Chlorhexidine         Medication List        Accurate as of Dec 15, 2021  9:51 AM. If you have any questions, ask  your nurse or doctor.          acetaminophen 325 MG tablet Commonly known as: TYLENOL Take 650 mg by mouth at bedtime.   acetaminophen 500 MG tablet Commonly known as: TYLENOL Take 1,000 mg by mouth 2 (two) times daily as needed for moderate pain or mild pain.   atorvastatin 40 MG tablet Commonly known as: LIPITOR Take 1 tablet (40 mg total) by mouth daily.   betamethasone 0.5% cream-vitamin a&d ointment 1:1 mixture Apply 1 application topically daily as needed (rectal pain).   bisacodyl 5 MG EC tablet Generic drug: bisacodyl Take 5 mg by mouth daily as needed for mild constipation or moderate constipation.   bisacodyl 10 MG suppository Commonly known as: DULCOLAX If not relieved by MOM, give 10 mg Bisacodyl suppositiory rectally X 1 dose in 24 hours  as needed (Do not use constipation standing orders for residents with renal failure/CFR less than 30. Contact MD for orders) (Physician Order)   BOOST PLUS PO Take 1 Can by mouth daily as needed (Mid morning, Vanilla).   buPROPion 150 MG 24 hr tablet Commonly known as: WELLBUTRIN XL Take 150 mg by mouth every morning.   clopidogrel 75 MG tablet Commonly known as: PLAVIX Take 1 tablet (75 mg total) by mouth daily.   cycloSPORINE 0.05 % ophthalmic emulsion Commonly known as: RESTASIS Place 1 drop into both eyes 2 (two) times daily.   dicyclomine 10 MG capsule Commonly known as: BENTYL Take 10 mg by mouth 3 (three) times daily as needed for spasms.   docusate sodium 100 MG capsule Commonly known as: COLACE Take 100 mg by mouth daily.   escitalopram 10 MG tablet Commonly known as: LEXAPRO Take 10 mg by mouth daily. In the evening between 4-7 pm   magnesium hydroxide 400 MG/5ML suspension Commonly known as: MILK OF MAGNESIA If no BM in 3 days, give 30 cc Milk of Magnesium p.o. x 1 dose in 24 hours as needed (Do not use standing constipation orders for residents with renal failure CFR less than 30. Contact MD for orders) (Physician Order)   melatonin 5 MG Tabs Take 5 mg by mouth at bedtime.   metoprolol tartrate 25 MG tablet Commonly known as: LOPRESSOR Take 12.5 mg by mouth 2 (two) times daily.   NON FORMULARY Diet: Regular, Chopped/Dysphagia 3   Special Instructions: Aspiration precautions. May have bacon per SLP.   pantoprazole 20 MG tablet Commonly known as: PROTONIX Take 20 mg by mouth daily.   RA SALINE ENEMA RE If not relieved by Biscodyl suppository, give disposable Saline Enema rectally X 1 dose/24 hrs as needed (Do not use constipation standing orders for residents with renal failure/CFR less than 30. Contact MD for orders)(Physician Or   senna 8.6 MG Tabs tablet Commonly known as: SENOKOT Take 2 tablets by mouth at bedtime as needed for mild constipation.    simethicone 80 MG chewable tablet Commonly known as: MYLICON Chew 1 tablet (80 mg total) by mouth every 8 (eight) hours as needed for flatulence.   UNABLE TO FIND Med Name: 1-2-3 Cream; 60 grams; amt: 1 application; topical  Special Instructions: Apply 1-2-3 cream twice daily to buttocks as needed for skin  flare up. Continue to use Desitin cream as a preventative. U        Review of Systems  Constitutional:  Positive for activity change. Negative for appetite change, chills, diaphoresis, fatigue, fever and unexpected weight change.  Respiratory:  Negative for shortness of breath.  Cardiovascular:  Negative for chest pain and leg swelling.  Musculoskeletal:  Positive for gait problem and joint swelling.       Left knee pain  Psychiatric/Behavioral:  Negative for confusion.    Immunization History  Administered Date(s) Administered   Fluad Quad(high Dose 65+) 04/27/2019   Influenza, High Dose Seasonal PF 05/03/2017, 05/19/2018   Influenza,inj,Quad PF,6+ Mos 04/29/2013, 05/18/2014, 05/02/2015, 04/17/2016   Influenza-Unspecified 05/13/2020, 04/26/2021   Moderna SARS-COV2 Booster Vaccination 12/25/2020   PFIZER(Purple Top)SARS-COV-2 Vaccination 08/27/2019, 09/21/2019, 06/13/2020   Pfizer Covid-19 Vaccine Bivalent Booster 5y-11y 05/03/2021   Pneumococcal Conjugate-13 05/11/2013   Pneumococcal Polysaccharide-23 05/02/2015   Tdap 01/23/2021   Zoster Recombinat (Shingrix) 01/20/2021, 03/24/2021   Zoster, Unspecified 01/20/2021   Pertinent  Health Maintenance Due  Topic Date Due   INFLUENZA VACCINE  02/20/2022   DEXA SCAN  Discontinued   COLONOSCOPY (Pts 45-86yr Insurance coverage will need to be confirmed)  Discontinued      08/04/2021    2:12 PM 08/14/2021    9:33 AM 08/28/2021    2:34 PM 11/29/2021    9:48 AM 12/15/2021    9:44 AM  Fall Risk  Falls in the past year? 1  0 0 1  Was there an injury with Fall? 0  0 0   Fall Risk Category Calculator 1  0 0   Fall Risk Category  Low  Low Low   Patient Fall Risk Level Moderate fall risk High fall risk Low fall risk Low fall risk Moderate fall risk  Patient at Risk for Falls Due to   No Fall Risks Impaired balance/gait No Fall Risks  Fall risk Follow up   Falls evaluation completed Falls evaluation completed Falls evaluation completed   Functional Status Survey:    Vitals:   12/15/21 0937  BP: 128/71  Pulse: 68  Resp: 18  Temp: (!) 97.3 F (36.3 C)  SpO2: 96%  Weight: 130 lb 6.4 oz (59.1 kg)  Height: '5\' 2"'$  (1.575 m)   Body mass index is 23.85 kg/m. Physical Exam Constitutional:      Appearance: Normal appearance.  Musculoskeletal:        General: Swelling (left knee anterior portion with warmth  no redness) and tenderness present. No deformity.     Right knee: Normal.     Right lower leg: Normal. No edema.     Left lower leg: Swelling and tenderness present. No deformity or lacerations. No edema.     Comments: +CMS to LLE Pain with extension noted. NOt able to perform other maneuvers due to pain  Neurological:     Mental Status: She is alert.    Labs reviewed: Recent Labs    06/01/21 0000 08/14/21 0942 11/28/21 0000  NA 144 139 143  K 4.2 3.9 4.3  CL 109* 105 108  CO2 25* 24 22  GLUCOSE  --  86  --   BUN 30* 16 14  CREATININE 0.7 0.84 0.8  CALCIUM 9.7 9.3 9.6   Recent Labs    01/24/21 0000 06/01/21 0000 06/02/21 0000  AST 14  --  18  ALT 24  --  23  ALKPHOS 112  --  79  ALBUMIN 3.9 4.1  --    Recent Labs    06/01/21 0000 08/14/21 0942 11/28/21 0000  WBC 13.6 12.2* 8.0  NEUTROABS 11.60  --   --   HGB 13.9 12.8 12.1  HCT 43 40.8 38  MCV  --  93.6  --   PLT  280 274 307   Lab Results  Component Value Date   TSH 1.68 11/28/2021   Lab Results  Component Value Date   HGBA1C 5.1 11/11/2019   Lab Results  Component Value Date   CHOL 151 11/28/2021   HDL 65 11/28/2021   LDLCALC 71 11/28/2021   LDLDIRECT 116.0 04/27/2019   TRIG 77 11/28/2021   CHOLHDL 3.0 11/11/2019     Significant Diagnostic Results in last 30 days:  No results found.  Assessment/Plan  1. Fall, initial encounter Mechanical in nature  2. Acute pain of left knee Continue ice, tylenol. Add ultram prn Xray pending - traMADol (ULTRAM) 50 MG tablet; Take 1 tablet (50 mg total) by mouth 3 (three) times daily as needed for up to 5 days.  Dispense: 15 tablet; Refill: 0   Family/ staff Communication:  Discussed with nurse and resident   Labs/tests ordered:   2 view left knee xray

## 2021-12-22 ENCOUNTER — Encounter: Payer: Self-pay | Admitting: Adult Health

## 2021-12-22 DIAGNOSIS — M25562 Pain in left knee: Secondary | ICD-10-CM | POA: Insufficient documentation

## 2021-12-22 NOTE — Telephone Encounter (Signed)
This encounter was created in error - please disregard.

## 2022-04-02 ENCOUNTER — Encounter: Payer: Self-pay | Admitting: Adult Health

## 2022-04-02 ENCOUNTER — Non-Acute Institutional Stay: Payer: Medicare Other | Admitting: Adult Health

## 2022-04-02 VITALS — BP 126/84 | HR 65 | Temp 98.4°F | Ht 62.0 in | Wt 130.6 lb

## 2022-04-02 DIAGNOSIS — I10 Essential (primary) hypertension: Secondary | ICD-10-CM | POA: Diagnosis not present

## 2022-04-02 DIAGNOSIS — H6123 Impacted cerumen, bilateral: Secondary | ICD-10-CM | POA: Diagnosis not present

## 2022-04-02 DIAGNOSIS — I700 Atherosclerosis of aorta: Secondary | ICD-10-CM | POA: Diagnosis not present

## 2022-04-02 DIAGNOSIS — I69354 Hemiplegia and hemiparesis following cerebral infarction affecting left non-dominant side: Secondary | ICD-10-CM | POA: Diagnosis not present

## 2022-04-02 DIAGNOSIS — F419 Anxiety disorder, unspecified: Secondary | ICD-10-CM

## 2022-04-02 DIAGNOSIS — F32A Depression, unspecified: Secondary | ICD-10-CM

## 2022-04-02 DIAGNOSIS — K219 Gastro-esophageal reflux disease without esophagitis: Secondary | ICD-10-CM

## 2022-04-02 DIAGNOSIS — E7800 Pure hypercholesterolemia, unspecified: Secondary | ICD-10-CM

## 2022-04-02 NOTE — Progress Notes (Unsigned)
Location:  [facility]  POS: Clinic  Provider: Royal Hawthorn, ANP  Code Status: DNR Goals of Care:     12/15/2021    9:44 AM  Advanced Directives  Does Patient Have a Medical Advance Directive? Yes  Type of Advance Directive Out of facility DNR (pink MOST or yellow form);Healthcare Power of Attorney  Does patient want to make changes to medical advance directive? No - Patient declined  Copy of [place] in Chart? Yes - validated most recent copy scanned in chart (See row information)  Pre-existing out of facility DNR order (yellow form or pink MOST form) Pink MOST form placed in chart (order not valid for inpatient use);Yellow form placed in chart (order not valid for inpatient use)     Chief Complaint  Patient presents with   Medical Management of Chronic Issues    Four month follow up. Discuss need for Flu and Covid vaccine or postpone if patient refuses. NCIR Verified.     HPI: Patient is a 76 y.o. female seen today for medical management of chronic diseases.     PMH CVA with left sided weakness 10/2019, also HTN, MCI, hearing loss, osteoporosis, OA, Raynauds, depression, HLD  She is working with OT on dressing, independence. [facility]   Has had some trouble going to sleep. Tosses and turns about 50% of the time. Takes melatonin for sleep.  Still has some anxiety. Her sister thinks she is overall better now that she is in AL. Still has anxiety around her routine in the morning.   She saw Dr Casimiro Needle and was started on Wellbutrin earlier this year.   Reports some constipation which is relieved with senokot s  Continues on protonix for issues of abd pain and gerd. Has hiatal hernia. No current symptoms reported.  Past Medical History:  Diagnosis Date   Anxiety    Aortic atherosclerosis (HCC)    Arthritis    Complication of anesthesia    per pt, slow to wake up past sedation.   CVA (cerebral vascular accident) ([place])  11/10/2019   Depression    Diverticulosis    Hiatal hernia    Hyperplastic colon polyp    Hypertension    Neuropathy    Osteoarthritis    Osteoporosis    Renal mass    Stomach upset    has a senstive stomach   Throat clearing    has a cough at times   Urinary incontinence    Vitamin D deficiency     Past Surgical History:  Procedure Laterality Date   COLONOSCOPY     FACIAL COSMETIC SURGERY     over 15 years ago   FINGER TENDON REPAIR     middle rt hand-   HAMMER TOE SURGERY  12/27/2011   Procedure: HAMMER TOE CORRECTION;  Surgeon: Wylene Simmer, MD;  Location: [facility];  Service: Orthopedics;  Laterality: Left;  2-4th    Allergies  Allergen Reactions   Chlorhexidine     Outpatient Encounter Medications as of 04/02/2022  Medication Sig   acetaminophen (TYLENOL) 325 MG tablet Take 650 mg by mouth at bedtime.   acetaminophen (TYLENOL) 500 MG tablet Take 1,000 mg by mouth 2 (two) times daily as needed for moderate pain or mild pain.   atorvastatin (LIPITOR) 40 MG tablet Take 1 tablet (40 mg total) by mouth daily.   betamethasone 0.5% cream-vitamin a&d ointment 1:1 mixture Apply 1 application topically daily as needed (rectal pain).  bisacodyl (BISACODYL) 5 MG EC tablet Take 5 mg by mouth daily as needed for mild constipation or moderate constipation.   bisacodyl (DULCOLAX) 10 MG suppository If not relieved by MOM, give 10 mg Bisacodyl suppositiory rectally X 1 dose in 24 hours as needed (Do not use constipation standing orders for residents with renal failure/CFR less than 30. Contact MD for orders) (Physician Order)   buPROPion (WELLBUTRIN XL) 150 MG 24 hr tablet Take 150 mg by mouth every morning.   clopidogrel (PLAVIX) 75 MG tablet Take 1 tablet (75 mg total) by mouth daily.   cycloSPORINE (RESTASIS) 0.05 % ophthalmic emulsion Place 1 drop into both eyes 2 (two) times daily.   dicyclomine (BENTYL) 10 MG capsule Take 10 mg by mouth 3 (three) times daily as  needed for spasms.   escitalopram (LEXAPRO) 10 MG tablet Take 10 mg by mouth daily.   magnesium hydroxide (MILK OF MAGNESIA) 400 MG/5ML suspension If no BM in 3 days, give 30 cc Milk of Magnesium p.o. x 1 dose in 24 hours as needed (Do not use standing constipation orders for residents with renal failure CFR less than 30. Contact MD for orders) (Physician Order)   melatonin 5 MG TABS Take 5 mg by mouth at bedtime.   metoprolol tartrate (LOPRESSOR) 25 MG tablet Take 12.5 mg by mouth 2 (two) times daily.   NON FORMULARY Diet: Regular, Chopped/Dysphagia 3   Special Instructions: Aspiration precautions. May have bacon per SLP.   Nutritional Supplements (BOOST PLUS PO) Take 1 Can by mouth daily as needed (Mid morning, Vanilla).   pantoprazole (PROTONIX) 20 MG tablet Take 20 mg by mouth daily.   senna (SENOKOT) 8.6 MG TABS tablet Take 2 tablets by mouth at bedtime as needed for mild constipation.   [DISCONTINUED] docusate sodium (COLACE) 100 MG capsule Take 100 mg by mouth daily.   [DISCONTINUED] simethicone (MYLICON) 80 MG chewable tablet Chew 1 tablet (80 mg total) by mouth every 8 (eight) hours as needed for flatulence.   [DISCONTINUED] Sodium Phosphates (RA SALINE ENEMA RE) If not relieved by Biscodyl suppository, give disposable Saline Enema rectally X 1 dose/24 hrs as needed (Do not use constipation standing orders for residents with renal failure/CFR less than 30. Contact MD for orders)(Physician Or   [DISCONTINUED] UNABLE TO FIND Med Name: 1-2-3 Cream; 60 grams; amt: 1 application; topical  Special Instructions: Apply 1-2-3 cream twice daily to buttocks as needed for skin  flare up. Continue to use Desitin cream as a preventative. U   No facility-administered encounter medications on file as of 04/02/2022.    Review of Systems:  Review of Systems  Constitutional:  Negative for activity change, appetite change, chills, diaphoresis, fatigue, fever and unexpected weight change.  HENT:  Negative  for congestion.   Respiratory:  Negative for cough, shortness of breath and wheezing.   Cardiovascular:  Negative for chest pain, palpitations and leg swelling.  Gastrointestinal:  Positive for constipation. Negative for abdominal distention, abdominal pain and diarrhea.  Genitourinary:  Negative for difficulty urinating and dysuria.  Musculoskeletal:  Positive for gait problem. Negative for arthralgias, back pain, joint swelling and myalgias.  Neurological:  Negative for dizziness, tremors, seizures, syncope, facial asymmetry, speech difficulty, weakness, light-headedness, numbness and headaches.  Psychiatric/Behavioral:  Negative for agitation, behavioral problems and confusion. The patient is nervous/anxious.     Health Maintenance  Topic Date Due   COVID-19 Vaccine (5 - Pfizer series) 09/03/2021   INFLUENZA VACCINE  02/20/2022   TETANUS/TDAP  01/24/2031  Pneumonia Vaccine 6+ Years old  Completed   Hepatitis C Screening  Completed   Zoster Vaccines- Shingrix  Completed   HPV VACCINES  Aged Out   DEXA SCAN  Discontinued   COLONOSCOPY (Pts 45-72yr Insurance coverage will need to be confirmed)  Discontinued    Physical Exam: Vitals:   04/02/22 1446  BP: 126/84  Pulse: 65  Temp: 98.4 F (36.9 C)  SpO2: 97%  Weight: 130 lb 9.6 oz (59.2 kg)  Height: '5\' 2"'$  (1.575 m)   Body mass index is 23.89 kg/m. Physical Exam Vitals and nursing note reviewed.  Constitutional:      General: She is not in acute distress.    Appearance: She is not diaphoretic.  HENT:     Head: Normocephalic and atraumatic.     Right Ear: There is impacted cerumen.     Left Ear: There is impacted cerumen.     Nose: Nose normal.     Mouth/Throat:     Mouth: Mucous membranes are moist.     Pharynx: Oropharynx is clear.  Eyes:     Conjunctiva/sclera: Conjunctivae normal.     Pupils: Pupils are equal, round, and reactive to light.  Neck:     Vascular: No JVD.  Cardiovascular:     Rate and Rhythm:  Normal rate and regular rhythm.     Heart sounds: No murmur heard. Pulmonary:     Effort: Pulmonary effort is normal. No respiratory distress.     Breath sounds: Normal breath sounds. No wheezing.  Musculoskeletal:     Right lower leg: No edema.     Left lower leg: No edema.  Skin:    General: Skin is warm and dry.  Neurological:     Mental Status: She is alert and oriented to person, place, and time.     Labs reviewed: Basic Metabolic Panel: Recent Labs    06/01/21 0000 08/14/21 0942 11/28/21 0000  NA 144 139 143  K 4.2 3.9 4.3  CL 109* 105 108  CO2 25* 24 22  GLUCOSE  --  86  --   BUN 30* 16 14  CREATININE 0.7 0.84 0.8  CALCIUM 9.7 9.3 9.6  TSH  --   --  1.68   Liver Function Tests: Recent Labs    06/01/21 0000 06/02/21 0000  AST  --  18  ALT  --  23  ALKPHOS  --  79  ALBUMIN 4.1  --    No results for input(s): "LIPASE", "AMYLASE" in the last 8760 hours. No results for input(s): "AMMONIA" in the last 8760 hours. CBC: Recent Labs    06/01/21 0000 08/14/21 0942 11/28/21 0000  WBC 13.6 12.2* 8.0  NEUTROABS 11.60  --   --   HGB 13.9 12.8 12.1  HCT 43 40.8 38  MCV  --  93.6  --   PLT 280 274 307   Lipid Panel: Recent Labs    11/28/21 0000  CHOL 151  HDL 65  LDLCALC 71  TRIG 77   Lab Results  Component Value Date   HGBA1C 5.1 11/11/2019    Procedures since last visit: No results found.  Assessment/Plan  1. Bilateral impacted cerumen Debrox protocol ordered  2. Primary hypertension Controlled on lopressor   3. Aortic atherosclerosis (HCC) On plavix and statin   4. 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 Pure  hypercholesterolemia Lab Results  Component Value Date   LDLCALC 71 11/28/2021   Continue lipitor   6. Anxiety and depression Has improved but continues to have anxiety around her routine. Will see  if Dr Casimiro Needle can f/u  7. Gastroesophageal reflux disease without esophagitis Continue protonix  8. Constipation Continue senokot and biscodyl prn  Labs/tests ordered:  * No order type specified * CBC BMP in 3 months Next appt:  apt with Dr Lyndel Safe in 3 months   Total time 57mn:  time greater than 50% of total time spent doing pt counseling and coordination of care

## 2022-04-10 ENCOUNTER — Telehealth: Payer: Self-pay

## 2022-04-10 ENCOUNTER — Encounter: Payer: Self-pay | Admitting: Neurology

## 2022-04-10 NOTE — Telephone Encounter (Signed)
Pls let sister know we can either do the appointment on Oct 3, or reschedule to January 2024. I do need to see her at least once a year, last visit was in January 2023. Ok to put on work in [place] if they r/s to January. Thanks

## 2022-04-11 NOTE — Telephone Encounter (Signed)
LMOM for patient sister to call us back

## 2022-04-24 ENCOUNTER — Ambulatory Visit: Payer: Medicare Other | Admitting: Neurology

## 2022-06-18 ENCOUNTER — Non-Acute Institutional Stay: Payer: Medicare Other | Admitting: Adult Health

## 2022-06-18 ENCOUNTER — Encounter: Payer: Self-pay | Admitting: Adult Health

## 2022-06-18 DIAGNOSIS — D509 Iron deficiency anemia, unspecified: Secondary | ICD-10-CM

## 2022-06-18 DIAGNOSIS — R1011 Right upper quadrant pain: Secondary | ICD-10-CM | POA: Diagnosis not present

## 2022-06-18 LAB — HEPATIC FUNCTION PANEL
ALT: 12 U/L (ref 7–35)
ALT: 12 U/L (ref 7–35)
AST: 16 (ref 13–35)
AST: 16 (ref 13–35)
Alkaline Phosphatase: 97 (ref 25–125)
Alkaline Phosphatase: 97 (ref 25–125)
Bilirubin, Total: 0.4
Bilirubin, Total: 0.4

## 2022-06-18 LAB — CBC AND DIFFERENTIAL
HCT: 30 — AB (ref 36–46)
Hemoglobin: 9.3 — AB (ref 12.0–16.0)
Platelets: 378 10*3/uL (ref 150–400)
WBC: 8

## 2022-06-18 LAB — COMPREHENSIVE METABOLIC PANEL
Albumin: 3.6 (ref 3.5–5.0)
Albumin: 3.6 (ref 3.5–5.0)
Calcium: 9.5 (ref 8.7–10.7)
Calcium: 9.5 (ref 8.7–10.7)
Globulin: 2
Globulin: 2
eGFR: 85
eGFR: 85

## 2022-06-18 LAB — IRON,TIBC AND FERRITIN PANEL: Iron: 22

## 2022-06-18 LAB — BASIC METABOLIC PANEL
BUN: 14 (ref 4–21)
BUN: 14 (ref 4–21)
CO2: 21 (ref 13–22)
CO2: 21 (ref 13–22)
Chloride: 106 (ref 99–108)
Chloride: 106 (ref 99–108)
Creatinine: 0.7 (ref 0.5–1.1)
Creatinine: 0.7 (ref 0.5–1.1)
Glucose: 78
Glucose: 78
Potassium: 4.1 mEq/L (ref 3.5–5.1)
Potassium: 4.1 mEq/L (ref 3.5–5.1)
Sodium: 141 (ref 137–147)
Sodium: 141 (ref 137–147)

## 2022-06-18 LAB — CBC: RBC: 4.04 (ref 3.87–5.11)

## 2022-06-18 NOTE — Progress Notes (Addendum)
Location:  Occupational psychologist of Service:  ALF (13) Provider:   Cindi Carbon, [place] [phone]   [name], MD  Patient Care Team: [name], MD as PCP - General (Internal Medicine) Wylene Simmer, MD as Consulting Physician (Orthopedic Surgery) Cameron Sprang, MD as Consulting Physician (Neurology)  Extended Emergency Contact Information Primary Emergency Contact: [facility] Address: [address]          [place], [place] [address] Johnnette Litter of [place] Mobile Phone: [phone] Relation: Sister Interpreter needed? No Secondary Emergency Contact: Baroni,Jim  [place] [employer] Phone: [phone] Relation: Brother  Code Status:  DNR Goals of care: Advanced Directive information    12/15/2021    9:44 AM  Advanced Directives  Does Patient Have a Medical Advance Directive? Yes  Type of Advance Directive Out of facility DNR (pink MOST or yellow form);Healthcare Power of Attorney  Does patient want to make changes to medical advance directive? No - Patient declined  Copy of [place] in Chart? Yes - validated most recent copy scanned in chart (See row information)  Pre-existing out of facility DNR order (yellow form or pink MOST form) Pink MOST form placed in chart (order not valid for inpatient use);Yellow form placed in chart (order not valid for inpatient use)     Chief Complaint  Patient presents with   Acute Visit    RUQ pain    HPI:  Pt is a 76 y.o. female seen today for an acute visit for RUQ pain. Pain was reported on 11/25 under right breast. This was first reported as chest pain on the right side and the oncall provider was notified. CXR was neg. EKG was done showing left axis deviation with NSR. No acute ST abnormalities. Pain is present under right breast and RUQ, worse with taking a deep breath. She has not had any sob. Able to eat and drink without difficulty. Took  tylenol with some improvement. No vomiting or fever. Labs ordered this morning and are pending.  INitially she refused the labs but later allowed them to be drawn.    Past Medical History:  Diagnosis Date   Anxiety    Aortic atherosclerosis (HCC)    Arthritis    Complication of anesthesia    per pt, slow to wake up past sedation.   CVA (cerebral vascular accident) ([place]) 11/10/2019   Depression    Diverticulosis    Hiatal hernia    Hyperplastic colon polyp    Hypertension    Neuropathy    Osteoarthritis    Osteoporosis    Renal mass    Stomach upset    has a senstive stomach   Throat clearing    has a cough at times   Urinary incontinence    Vitamin D deficiency    Past Surgical History:  Procedure Laterality Date   COLONOSCOPY     FACIAL COSMETIC SURGERY     over 15 years ago   FINGER TENDON REPAIR     middle rt hand-   HAMMER TOE SURGERY  12/27/2011   Procedure: HAMMER TOE CORRECTION;  Surgeon: Wylene Simmer, MD;  Location: [facility];  Service: Orthopedics;  Laterality: Left;  2-4th    Allergies  Allergen Reactions   Chlorhexidine     Outpatient Encounter Medications as of 06/18/2022  Medication Sig   acetaminophen (TYLENOL) 325 MG tablet Take 650 mg by mouth at bedtime.   acetaminophen (  TYLENOL) 500 MG tablet Take 1,000 mg by mouth 2 (two) times daily as needed for moderate pain or mild pain.   atorvastatin (LIPITOR) 40 MG tablet Take 1 tablet (40 mg total) by mouth daily.   betamethasone 0.5% cream-vitamin a&d ointment 1:1 mixture Apply 1 application topically daily as needed (rectal pain).   bisacodyl (BISACODYL) 5 MG EC tablet Take 5 mg by mouth daily as needed for mild constipation or moderate constipation.   bisacodyl (DULCOLAX) 10 MG suppository If not relieved by MOM, give 10 mg Bisacodyl suppositiory rectally X 1 dose in 24 hours as needed (Do not use constipation standing orders for residents with renal failure/CFR less than 30. Contact MD for  orders) (Physician Order)   buPROPion (WELLBUTRIN XL) 150 MG 24 hr tablet Take 150 mg by mouth every morning.   clopidogrel (PLAVIX) 75 MG tablet Take 1 tablet (75 mg total) by mouth daily.   cycloSPORINE (RESTASIS) 0.05 % ophthalmic emulsion Place 1 drop into both eyes 2 (two) times daily.   dicyclomine (BENTYL) 10 MG capsule Take 10 mg by mouth 3 (three) times daily as needed for spasms.   escitalopram (LEXAPRO) 10 MG tablet Take 10 mg by mouth daily.   magnesium hydroxide (MILK OF MAGNESIA) 400 MG/5ML suspension If no BM in 3 days, give 30 cc Milk of Magnesium p.o. x 1 dose in 24 hours as needed (Do not use standing constipation orders for residents with renal failure CFR less than 30. Contact MD for orders) (Physician Order)   melatonin 5 MG TABS Take 5 mg by mouth at bedtime.   metoprolol tartrate (LOPRESSOR) 25 MG tablet Take 12.5 mg by mouth 2 (two) times daily.   NON FORMULARY Diet: Regular, Chopped/Dysphagia 3   Special Instructions: Aspiration precautions. May have bacon per SLP.   Nutritional Supplements (BOOST PLUS PO) Take 1 Can by mouth daily as needed (Mid morning, Vanilla).   pantoprazole (PROTONIX) 20 MG tablet Take 20 mg by mouth daily.   senna (SENOKOT) 8.6 MG TABS tablet Take 2 tablets by mouth at bedtime as needed for mild constipation.   No facility-administered encounter medications on file as of 06/18/2022.    Review of Systems  Constitutional:  Negative for activity change, appetite change, chills, diaphoresis, fatigue, fever and unexpected weight change.  HENT:  Negative for congestion.   Respiratory:  Negative for cough, shortness of breath and wheezing.   Cardiovascular:  Negative for chest pain, palpitations and leg swelling.       No substernal chest pain or pain with exertion. Has pain under breast on right and RUQ, worse with deep breath  Gastrointestinal:  Positive for abdominal pain (RUQ under breast). Negative for abdominal distention, constipation and  diarrhea.  Genitourinary:  Negative for difficulty urinating and dysuria.  Musculoskeletal:  Positive for gait problem. Negative for arthralgias, back pain, joint swelling and myalgias.  Neurological:  Negative for dizziness, tremors, seizures, syncope, facial asymmetry, speech difficulty, weakness, light-headedness, numbness and headaches.  Psychiatric/Behavioral:  Negative for agitation, behavioral problems and confusion. The patient is nervous/anxious.     Immunization History  Administered Date(s) Administered   Fluad Quad(high Dose 65+) 04/27/2019, 04/27/2022   Influenza, High Dose Seasonal PF 05/03/2017, 05/19/2018   Influenza,inj,Quad PF,6+ Mos 04/29/2013, 05/18/2014, 05/02/2015, 04/17/2016   Influenza-Unspecified 05/13/2020, 04/26/2021   Moderna SARS-COV2 Booster Vaccination 12/25/2020   PFIZER(Purple Top)SARS-COV-2 Vaccination 08/27/2019, 09/21/2019, 06/13/2020   Pfizer Covid-19 Vaccine Bivalent Booster 5y-11y 05/03/2021   Pneumococcal Conjugate-13 05/11/2013   Pneumococcal Polysaccharide-23 05/02/2015  Tdap 01/23/2021   Zoster Recombinat (Shingrix) 01/20/2021, 03/24/2021   Zoster, Unspecified 01/20/2021   Pertinent  Health Maintenance Due  Topic Date Due   INFLUENZA VACCINE  Completed   DEXA SCAN  Discontinued   COLONOSCOPY (Pts 45-37yr Insurance coverage will need to be confirmed)  Discontinued      08/04/2021    2:12 PM 08/14/2021    9:33 AM 08/28/2021    2:34 PM 11/29/2021    9:48 AM 12/15/2021    9:44 AM  Fall Risk  Falls in the past year? 1  0 0 1  Was there an injury with Fall? 0  0 0   Fall Risk Category Calculator 1  0 0   Fall Risk Category Low  Low Low   Patient Fall Risk Level Moderate fall risk High fall risk Low fall risk Low fall risk Moderate fall risk  Patient at Risk for Falls Due to   No Fall Risks Impaired balance/gait No Fall Risks  Fall risk Follow up   Falls evaluation completed Falls evaluation completed Falls evaluation completed   Functional  Status Survey:    Vitals:   06/18/22 1133  BP: 137/79  Pulse: 74  Resp: 18  Temp: 97.6 F (36.4 C)  SpO2: 93%   There is no height or weight on file to calculate BMI. Physical Exam Vitals and nursing note reviewed.  Constitutional:      General: She is not in acute distress.    Appearance: She is not diaphoretic.  HENT:     Head: Normocephalic and atraumatic.  Neck:     Vascular: No JVD.  Cardiovascular:     Rate and Rhythm: Normal rate and regular rhythm.     Heart sounds: No murmur heard. Pulmonary:     Effort: Pulmonary effort is normal. No respiratory distress.     Breath sounds: Normal breath sounds. No wheezing.  Abdominal:     General: Abdomen is flat. Bowel sounds are normal. There is no distension.     Palpations: There is no mass.     Tenderness: There is no abdominal tenderness. There is no right CVA tenderness, left CVA tenderness, guarding or rebound.     Hernia: No hernia is present.  Musculoskeletal:     Right lower leg: No edema.     Left lower leg: No edema.  Skin:    General: Skin is warm and dry.  Neurological:     Mental Status: She is alert. Mental status is at baseline.  Psychiatric:        Mood and Affect: Mood normal.     Labs reviewed: Recent Labs    08/14/21 0942 11/28/21 0000  NA 139 143  K 3.9 4.3  CL 105 108  CO2 24 22  GLUCOSE 86  --   BUN 16 14  CREATININE 0.84 0.8  CALCIUM 9.3 9.6   No results for input(s): "AST", "ALT", "ALKPHOS", "BILITOT", "PROT", "ALBUMIN" in the last 8760 hours. Recent Labs    08/14/21 0942 11/28/21 0000  WBC 12.2* 8.0  HGB 12.8 12.1  HCT 40.8 38  MCV 93.6  --   PLT 274 307   Lab Results  Component Value Date   TSH 1.68 11/28/2021   Lab Results  Component Value Date   HGBA1C 5.1 11/11/2019   Lab Results  Component Value Date   CHOL 151 11/28/2021   HDL 65 11/28/2021   LDLCALC 71 11/28/2021   LDLDIRECT 116.0 04/27/2019   TRIG 77 11/28/2021  CHOLHDL 3.0 11/11/2019     Significant Diagnostic Results in last 30 days:  No results found.  Assessment/Plan 1. Right upper quadrant pain No fever or vomiting, able to eat and drink bland food.  CMP returned normal.  CXR neg Obtain abd ultrasound of RUQ to eval for gallbladder issues. Monitor vitals qshift, if worsening send ot ED.    2. Microcytic anemia.  Has Hgb of 9.2 not critical but dropping.  Will hemoccult stools Recheck CBC with iron panel, B12 folate on 11/30    Cindi Carbon, ANP [place] [phone]   Total time 19mn:  time greater than 50% of total time spent doing pt counseling and coordination of care

## 2022-06-21 LAB — VITAMIN B12: Vitamin B-12: 459

## 2022-06-21 LAB — CBC AND DIFFERENTIAL
HCT: 31 — AB (ref 36–46)
Hemoglobin: 9.8 — AB (ref 12.0–16.0)
Platelets: 435 10*3/uL — AB (ref 150–400)
WBC: 10.6

## 2022-06-21 LAB — CBC: RBC: 4.26 (ref 3.87–5.11)

## 2022-06-22 ENCOUNTER — Non-Acute Institutional Stay: Payer: Medicare Other | Admitting: Adult Health

## 2022-06-22 ENCOUNTER — Encounter: Payer: Self-pay | Admitting: Adult Health

## 2022-06-22 DIAGNOSIS — R1011 Right upper quadrant pain: Secondary | ICD-10-CM | POA: Diagnosis not present

## 2022-06-22 DIAGNOSIS — F419 Anxiety disorder, unspecified: Secondary | ICD-10-CM | POA: Diagnosis not present

## 2022-06-22 DIAGNOSIS — D509 Iron deficiency anemia, unspecified: Secondary | ICD-10-CM | POA: Diagnosis not present

## 2022-06-22 DIAGNOSIS — F32A Depression, unspecified: Secondary | ICD-10-CM

## 2022-06-22 NOTE — Progress Notes (Signed)
Location:  Occupational psychologist of Service:  ALF [phone]) Provider:  Royal Hawthorn, NP  [name], MD  Patient Care Team: [name], MD as PCP - General (Internal Medicine) Wylene Simmer, MD as Consulting Physician (Orthopedic Surgery) Cameron Sprang, MD as Consulting Physician (Neurology)  Extended Emergency Contact Information Primary Emergency Contact: [facility] Address: [address]          [place], [place] [address] Johnnette Litter of [place] Mobile Phone: [phone] Relation: Sister Interpreter needed? No Secondary Emergency Contact: Varricchio,Jim  [place] [employer] Phone: [phone] Relation: Brother  Code Status:  DNR Goals of care: Advanced Directive information    06/22/2022    8:49 AM  Advanced Directives  Does Patient Have a Medical Advance Directive? Yes  Type of Paramedic of [place];Out of facility DNR (pink MOST or yellow form)  Does patient want to make changes to medical advance directive? No - Patient declined  Copy of [place] in Chart? Yes - validated most recent copy scanned in chart (See row information)     Chief Complaint  Patient presents with   Medical Management of Chronic Issues    Follow up on Anemia    Quality Metric Gaps    Discussed the need for Medicare annual wellness    HPI:  Pt is a 76 y.o. female seen today for follow up regarding anemia and abd pain  Resident first had RUQ abd pain and under right breast on 11/27. A CXR was obtained which was normal. An abd ultrasound was obtained that showed no issues with the liver or gallbladder. CMP was normal. CBC showed Hgb 9.3 MCV 74.4 11/27, repeat 11/30 Hgb 9.8, MCV 73.4 Iron 22 V89 381 folic acid 6.9   Hemoccults were ordered but she refused. She has been having some anxiety about routine changes. Her sister reports some issues with memory and difficulty with puzzles. Seems to get overwhelmed in  conversation. Wants to know who the staff will be each day and frazzles easily.   She is on protonix for gerd/esophagitis.   Had CT 2021 for abd pain with signs of fecal impaction sterocoral colitis, proctitis, large duodenal diverticulum, small lesion on the rigth kidney neoplasm vs hemorrhagic cyst.   02/2019: normal colonoscopy, first degree relative hx of ca, EGD showed esophagitis and hiatal hernia, placed on protonix.   At this time the pain has resolved and she feels she is back to baseline. Eating and drinking well no pain or fever No blood in stool.  Past Medical History:  Diagnosis Date   Anxiety    Aortic atherosclerosis (HCC)    Arthritis    Complication of anesthesia    per pt, slow to wake up past sedation.   CVA (cerebral vascular accident) ([facility]) 11/10/2019   Depression    Diverticulosis    Hiatal hernia    Hyperplastic colon polyp    Hypertension    Neuropathy    Osteoarthritis    Osteoporosis    Renal mass    Stomach upset    has a senstive stomach   Throat clearing    has a cough at times   Urinary incontinence    Vitamin D deficiency    Past Surgical History:  Procedure Laterality Date   COLONOSCOPY     FACIAL COSMETIC SURGERY     over 15 years ago   FINGER TENDON REPAIR     middle rt hand-  HAMMER TOE SURGERY  12/27/2011   Procedure: HAMMER TOE CORRECTION;  Surgeon: Wylene Simmer, MD;  Location: [facility];  Service: Orthopedics;  Laterality: Left;  2-4th    Allergies  Allergen Reactions   Chlorhexidine     Outpatient Encounter Medications as of 06/22/2022  Medication Sig   acetaminophen (TYLENOL) 325 MG tablet Take 650 mg by mouth at bedtime.   acetaminophen (TYLENOL) 500 MG tablet Take 1,000 mg by mouth 2 (two) times daily as needed for moderate pain or mild pain.   atorvastatin (LIPITOR) 40 MG tablet Take 1 tablet (40 mg total) by mouth daily.   bisacodyl (DULCOLAX) 10 MG suppository If not relieved by MOM, give 10 mg Bisacodyl  suppositiory rectally X 1 dose in 24 hours as needed (Do not use constipation standing orders for residents with renal failure/CFR less than 30. Contact MD for orders) (Physician Order)   buPROPion (WELLBUTRIN XL) 150 MG 24 hr tablet Take 150 mg by mouth every morning.   clopidogrel (PLAVIX) 75 MG tablet Take 1 tablet (75 mg total) by mouth daily.   cycloSPORINE (RESTASIS) 0.05 % ophthalmic emulsion Place 1 drop into both eyes 2 (two) times daily.   escitalopram (LEXAPRO) 10 MG tablet Take 10 mg by mouth daily.   magnesium hydroxide (MILK OF MAGNESIA) 400 MG/5ML suspension If no BM in 3 days, give 30 cc Milk of Magnesium p.o. x 1 dose in 24 hours as needed (Do not use standing constipation orders for residents with renal failure CFR less than 30. Contact MD for orders) (Physician Order)   melatonin 5 MG TABS Take 5 mg by mouth at bedtime.   metoprolol tartrate (LOPRESSOR) 25 MG tablet Take 12.5 mg by mouth 2 (two) times daily.   NON FORMULARY Diet: Regular, Chopped/Dysphagia 3   Special Instructions: Aspiration precautions. May have bacon per SLP.   Nutritional Supplements (BOOST PLUS PO) Take 1 Can by mouth daily as needed (Mid morning, Vanilla).   pantoprazole (PROTONIX) 20 MG tablet Take 20 mg by mouth daily.   senna (SENOKOT) 8.6 MG TABS tablet Take 2 tablets by mouth at bedtime as needed for mild constipation.   [DISCONTINUED] betamethasone 0.5% cream-vitamin a&d ointment 1:1 mixture Apply 1 application topically daily as needed (rectal pain).   [DISCONTINUED] bisacodyl (BISACODYL) 5 MG EC tablet Take 5 mg by mouth daily as needed for mild constipation or moderate constipation.   [DISCONTINUED] dicyclomine (BENTYL) 10 MG capsule Take 10 mg by mouth 3 (three) times daily as needed for spasms.   No facility-administered encounter medications on file as of 06/22/2022.    Review of Systems  Constitutional:  Negative for activity change, appetite change, chills, diaphoresis, fatigue, fever and  unexpected weight change.  HENT:  Negative for congestion.   Respiratory:  Negative for cough, shortness of breath and wheezing.   Cardiovascular:  Negative for chest pain, palpitations and leg swelling.  Gastrointestinal:  Positive for constipation. Negative for abdominal distention, abdominal pain, anal bleeding, blood in stool, diarrhea, nausea, rectal pain and vomiting.  Genitourinary:  Negative for difficulty urinating and dysuria.  Musculoskeletal:  Positive for gait problem. Negative for arthralgias, back pain, joint swelling and myalgias.  Neurological:  Positive for weakness. Negative for dizziness, tremors, seizures, syncope, facial asymmetry, speech difficulty, light-headedness, numbness and headaches.  Psychiatric/Behavioral:  Negative for agitation, behavioral problems and confusion. The patient is nervous/anxious.        Short term memory loss.     Immunization History  Administered Date(s)  Administered   Fluad Quad(high Dose 65+) 04/27/2019, 04/27/2022   Influenza, High Dose Seasonal PF 05/03/2017, 05/19/2018   Influenza,inj,Quad PF,6+ Mos 04/29/2013, 05/18/2014, 05/02/2015, 04/17/2016   Influenza-Unspecified 05/13/2020, 04/26/2021   Moderna SARS-COV2 Booster Vaccination 12/25/2020   PFIZER(Purple Top)SARS-COV-2 Vaccination 08/27/2019, 09/21/2019, 06/13/2020   Pfizer Covid-19 Vaccine Bivalent Booster 5y-11y 05/03/2021   Pneumococcal Conjugate-13 05/11/2013   Pneumococcal Polysaccharide-23 05/02/2015   Tdap 01/23/2021   Zoster Recombinat (Shingrix) 01/20/2021, 03/24/2021   Zoster, Unspecified 01/20/2021   Pertinent  Health Maintenance Due  Topic Date Due   INFLUENZA VACCINE  Completed   DEXA SCAN  Discontinued   COLONOSCOPY (Pts 45-36yr Insurance coverage will need to be confirmed)  Discontinued      08/04/2021    2:12 PM 08/14/2021    9:33 AM 08/28/2021    2:34 PM 11/29/2021    9:48 AM 12/15/2021    9:44 AM  Fall Risk  Falls in the past year? 1  0 0 1  Was there an  injury with Fall? 0  0 0   Fall Risk Category Calculator 1  0 0   Fall Risk Category Low  Low Low   Patient Fall Risk Level Moderate fall risk High fall risk Low fall risk Low fall risk Moderate fall risk  Patient at Risk for Falls Due to   No Fall Risks Impaired balance/gait No Fall Risks  Fall risk Follow up   Falls evaluation completed Falls evaluation completed Falls evaluation completed   Functional Status Survey:    Vitals:   06/22/22 0839  BP: 129/80  Pulse: 69  Resp: 16  Temp: (!) 97 F (36.1 C)  TempSrc: Temporal  SpO2: 96%  Weight: 130 lb 6.4 oz (59.1 kg)  Height: '5\' 2"'$  (1.575 m)   Body mass index is 23.85 kg/m. Physical Exam Vitals and nursing note reviewed.  Constitutional:      General: She is not in acute distress.    Appearance: She is not diaphoretic.  HENT:     Head: Normocephalic and atraumatic.  Neck:     Vascular: No JVD.  Cardiovascular:     Rate and Rhythm: Normal rate and regular rhythm.     Heart sounds: No murmur heard. Pulmonary:     Effort: Pulmonary effort is normal. No respiratory distress.     Breath sounds: Normal breath sounds. No wheezing.  Abdominal:     General: Abdomen is flat. Bowel sounds are normal.     Palpations: Abdomen is soft.  Skin:    General: Skin is warm and dry.  Neurological:     Mental Status: She is alert and oriented to person, place, and time.     Labs reviewed: Recent Labs    08/14/21 0942 11/28/21 0000  NA 139 143  K 3.9 4.3  CL 105 108  CO2 24 22  GLUCOSE 86  --   BUN 16 14  CREATININE 0.84 0.8  CALCIUM 9.3 9.6   No results for input(s): "AST", "ALT", "ALKPHOS", "BILITOT", "PROT", "ALBUMIN" in the last 8760 hours. Recent Labs    08/14/21 0942 11/28/21 0000  WBC 12.2* 8.0  HGB 12.8 12.1  HCT 40.8 38  MCV 93.6  --   PLT 274 307   Lab Results  Component Value Date   TSH 1.68 11/28/2021   Lab Results  Component Value Date   HGBA1C 5.1 11/11/2019   Lab Results  Component Value Date    CHOL 151 11/28/2021   HDL 65 11/28/2021  LDLCALC 71 11/28/2021   LDLDIRECT 116.0 04/27/2019   TRIG 77 11/28/2021   CHOLHDL 3.0 11/11/2019    Significant Diagnostic Results in last 30 days:  No results found.  Assessment/Plan  1. Right upper quadrant pain Has resolved If worsening would recommend CT of the abd   2. Iron deficiency anemia, unspecified iron deficiency anemia type Recommended iron therapy three times a week resident refused despite education regarding anemia.  Also refused hemoccults. Educated that this is a screening tool for cancer still refused. We agreed to recheck the iron panel in one month and if things worsened she would reconsider. Also recommend dietary to help with iron rich food.   3. Anxiety and depression Seems to be worsening and affecting her memory and choices. Will give her some time to adjust to AL vs EAL. I let the DON know and she will coordinate with Dr Casimiro Needle to re eval in necessary   Family/ staff Communication: discussed with sister [name]  Labs/tests ordered:  repeat CBC with iron panel, ferritin in one month  Total time 31mn:  time greater than 50% of total time spent doing pt counseling and coordination of care

## 2022-07-03 ENCOUNTER — Encounter: Payer: Self-pay | Admitting: Internal Medicine

## 2022-07-03 ENCOUNTER — Non-Acute Institutional Stay: Payer: Medicare Other | Admitting: Internal Medicine

## 2022-07-03 VITALS — BP 128/70 | HR 65 | Temp 98.0°F | Resp 18 | Ht 62.0 in | Wt 127.0 lb

## 2022-07-03 DIAGNOSIS — F5101 Primary insomnia: Secondary | ICD-10-CM | POA: Diagnosis not present

## 2022-07-03 DIAGNOSIS — D509 Iron deficiency anemia, unspecified: Secondary | ICD-10-CM | POA: Diagnosis not present

## 2022-07-03 DIAGNOSIS — I69391 Dysphagia following cerebral infarction: Secondary | ICD-10-CM

## 2022-07-03 DIAGNOSIS — K5901 Slow transit constipation: Secondary | ICD-10-CM | POA: Diagnosis not present

## 2022-07-03 DIAGNOSIS — F32A Depression, unspecified: Secondary | ICD-10-CM

## 2022-07-03 DIAGNOSIS — F419 Anxiety disorder, unspecified: Secondary | ICD-10-CM | POA: Diagnosis not present

## 2022-07-03 DIAGNOSIS — I69354 Hemiplegia and hemiparesis following cerebral infarction affecting left non-dominant side: Secondary | ICD-10-CM

## 2022-07-03 DIAGNOSIS — K219 Gastro-esophageal reflux disease without esophagitis: Secondary | ICD-10-CM

## 2022-07-03 DIAGNOSIS — I10 Essential (primary) hypertension: Secondary | ICD-10-CM

## 2022-07-03 DIAGNOSIS — E7800 Pure hypercholesterolemia, unspecified: Secondary | ICD-10-CM

## 2022-07-03 NOTE — Progress Notes (Signed)
Abstract  

## 2022-07-03 NOTE — Progress Notes (Unsigned)
Location:  [facility] of Service:  Clinic (12)  Provider:   Code Status: DNR Goals of Care:     06/22/2022    8:49 AM  Advanced Directives  Does Patient Have a Medical Advance Directive? Yes  Type of Paramedic of [place];Out of facility DNR (pink MOST or yellow form)  Does patient want to make changes to medical advance directive? No - Patient declined  Copy of [place] in Chart? Yes - validated most recent copy scanned in chart (See row information)     Chief Complaint  Patient presents with   Medical Management of Chronic Issues    Patient is being seen for a 3 month follow up   Quality Metric Gaps    Discussed the need for AWV and covid vaccine     HPI: Patient is a 76 y.o. female seen today for medical management of chronic diseases.    Lives in [place] in [place]  Patient has a history of hypertension, hyperlipidemia, depression and anxiety, osteoporosis   bilateral CVAs left greater than right in 2021 Followed By Dysphagia , Dysarthria,    [facility]ives in [facility] Needs some help with her ADLS Mild assist with transfers. Uses Her Power chair  Cognitively doing better  She had episode of RUQ pain few weeks ago Labs were normal including LFTS But she did have Microcytic Anemia with MCV 73 And iron of 22 B12 level normal Repeated twice Occult has been negative Her RUQ pain has also resolved She does have constipation complain. Refused Iron supplement due to this Wt Readings from Last 3 Encounters:  07/03/22 127 lb (57.6 kg)  06/22/22 130 lb 6.4 oz (59.1 kg)  04/02/22 130 lb 9.6 oz (59.2 kg)    Weight is mostly stable Colonoscopy in 2020 was normal  Also c/o Insomnia wants something more then Melatonin  Past Medical History:  Diagnosis Date   Anxiety    Aortic atherosclerosis (HCC)    Arthritis    Complication of anesthesia    per pt, slow to wake up past sedation.   CVA (cerebral  vascular accident) ([place]) 11/10/2019   Depression    Diverticulosis    Hiatal hernia    Hyperplastic colon polyp    Hypertension    Neuropathy    Osteoarthritis    Osteoporosis    Renal mass    Stomach upset    has a senstive stomach   Throat clearing    has a cough at times   Urinary incontinence    Vitamin D deficiency     Past Surgical History:  Procedure Laterality Date   COLONOSCOPY     FACIAL COSMETIC SURGERY     over 15 years ago   FINGER TENDON REPAIR     middle rt hand-   HAMMER TOE SURGERY  12/27/2011   Procedure: HAMMER TOE CORRECTION;  Surgeon: Wylene Simmer, MD;  Location: [facility];  Service: Orthopedics;  Laterality: Left;  2-4th    Allergies  Allergen Reactions   Chlorhexidine     Outpatient Encounter Medications as of 07/03/2022  Medication Sig   acetaminophen (TYLENOL) 325 MG tablet Take 650 mg by mouth at bedtime.   acetaminophen (TYLENOL) 500 MG tablet Take 1,000 mg by mouth 2 (two) times daily as needed for moderate pain or mild pain.   atorvastatin (LIPITOR) 40 MG tablet Take 1 tablet (40 mg total) by mouth daily.   buPROPion [facility]  XL) 150 MG 24 hr tablet Take 150 mg by mouth every morning.   clopidogrel (PLAVIX) 75 MG tablet Take 1 tablet (75 mg total) by mouth daily.   cycloSPORINE (RESTASIS) 0.05 % ophthalmic emulsion Place 1 drop into both eyes 2 (two) times daily.   escitalopram (LEXAPRO) 10 MG tablet Take 10 mg by mouth daily.   magnesium hydroxide (MILK OF MAGNESIA) 400 MG/5ML suspension If no BM in 3 days, give 30 cc Milk of Magnesium p.o. x 1 dose in 24 hours as needed (Do not use standing constipation orders for residents with renal failure CFR less than 30. Contact MD for orders) (Physician Order)   melatonin 5 MG TABS Take 5 mg by mouth at bedtime.   metoprolol tartrate (LOPRESSOR) 25 MG tablet Take 12.5 mg by mouth 2 (two) times daily.   NON FORMULARY Diet: Regular, Chopped/Dysphagia 3   Special Instructions:  Aspiration precautions. May have bacon per SLP.   Nutritional Supplements (BOOST PLUS PO) Take 1 Can by mouth daily as needed (Mid morning, Vanilla).   pantoprazole (PROTONIX) 20 MG tablet Take 20 mg by mouth daily.   senna (SENOKOT) 8.6 MG TABS tablet Take 2 tablets by mouth at bedtime as needed for mild constipation.   [DISCONTINUED] bisacodyl (DULCOLAX) 10 MG suppository If not relieved by MOM, give 10 mg Bisacodyl suppositiory rectally X 1 dose in 24 hours as needed (Do not use constipation standing orders for residents with renal failure/CFR less than 30. Contact MD for orders) (Physician Order)   No facility-administered encounter medications on file as of 07/03/2022.    Review of Systems:  Review of Systems  Constitutional:  Negative for activity change and appetite change.  HENT: Negative.    Respiratory:  Negative for cough and shortness of breath.   Cardiovascular:  Negative for leg swelling.  Gastrointestinal:  Positive for constipation. Negative for blood in stool.  Genitourinary: Negative.   Musculoskeletal:  Positive for gait problem. Negative for arthralgias and myalgias.  Skin: Negative.   Neurological:  Negative for dizziness and weakness.  Psychiatric/Behavioral:  Positive for sleep disturbance. Negative for confusion and dysphoric mood.     Health Maintenance  Topic Date Due   Medicare Annual Wellness (AWV)  Never done   COVID-19 Vaccine (6 - 2023-24 season) 03/23/2022   DTaP/Tdap/Td (2 - Td or Tdap) 01/24/2031   Pneumonia Vaccine 62+ Years old  Completed   INFLUENZA VACCINE  Completed   Hepatitis C Screening  Completed   Zoster Vaccines- Shingrix  Completed   HPV VACCINES  Aged Out   DEXA SCAN  Discontinued   COLONOSCOPY (Pts 45-26yr Insurance coverage will need to be confirmed)  Discontinued    Physical Exam: Vitals:   07/03/22 1402  BP: 128/70  Pulse: 65  Resp: 18  Temp: 98 F (36.7 C)  TempSrc: Temporal  SpO2: 99%  Weight: 127 lb (57.6 kg)   Height: '5\' 2"'$  (1.575 m)   Body mass index is 23.23 kg/m. Physical Exam Vitals reviewed.  Constitutional:      Appearance: Normal appearance.  HENT:     Head: Normocephalic.     Right Ear: Tympanic membrane normal.     Left Ear: Tympanic membrane normal.     Ears:     Comments: Did have some wax    Nose: Nose normal.     Mouth/Throat:     Mouth: Mucous membranes are moist.     Pharynx: Oropharynx is clear.  Eyes:     Pupils:  Pupils are equal, round, and reactive to light.  Cardiovascular:     Rate and Rhythm: Normal rate and regular rhythm.     Pulses: Normal pulses.     Heart sounds: Normal heart sounds. No murmur heard. Pulmonary:     Effort: Pulmonary effort is normal.     Breath sounds: Normal breath sounds.  Abdominal:     General: Abdomen is flat. Bowel sounds are normal.     Palpations: Abdomen is soft.  Musculoskeletal:        General: No swelling.     Cervical back: Neck supple.  Skin:    General: Skin is warm.  Neurological:     Mental Status: She is alert and oriented to person, place, and time.     Comments: 3/5 in UE and LE Gait not tested  Psychiatric:        Mood and Affect: Mood normal.        Thought Content: Thought content normal.     Labs reviewed: Basic Metabolic Panel: Recent Labs    08/14/21 0942 11/28/21 0000 06/18/22 0000  NA 139 143 141  K 3.9 4.3 4.1  CL 105 108 106  CO2 '24 22 21  '$ GLUCOSE 86  --   --   BUN '16 14 14  '$ CREATININE 0.84 0.8 0.7  CALCIUM 9.3 9.6 9.5  TSH  --  1.68  --    Liver Function Tests: Recent Labs    06/18/22 0000  AST 16  ALT 12  ALKPHOS 97  ALBUMIN 3.6   No results for input(s): "LIPASE", "AMYLASE" in the last 8760 hours. No results for input(s): "AMMONIA" in the last 8760 hours. CBC: Recent Labs    08/14/21 0942 11/28/21 0000 06/18/22 0000 06/21/22 0000  WBC 12.2* 8.0 8.0 10.6  HGB 12.8 12.1 9.3* 9.8*  HCT 40.8 38 30* 31*  MCV 93.6  --   --   --   PLT 274 307 378 435*   Lipid  Panel: Recent Labs    11/28/21 0000  CHOL 151  HDL 65  LDLCALC 71  TRIG 77   Lab Results  Component Value Date   HGBA1C 5.1 11/11/2019    Procedures since last visit: No results found.  Assessment/Plan 1. Iron deficiency anemia, unspecified iron deficiency anemia type Refuses Iron Supplement Her Colonoscopy in 2020 was negative Stool Occult negative Repeat Labs are pending ? CT scan of Abdomen ? Hematology  She wants to wait  2. Microcytic anemia Detail Iron studies pending  3. Slow transit constipation Add Miralax 3/week  4. Anxiety and depression Wellbutrin and Lexapro  5. Primary insomnia Trazodone 25 mg QHS Discontinue Melatonin  6. Primary hypertension On low dose of Metoprolol  7. Pure hypercholesterolemia statin Recent Labs    11/28/21 0000  CHOL 151  HDL 65  LDLCALC 71  TRIG 77   8. 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 Gastroesophageal reflux disease without esophagitis On Protonix  10. Dysphagia following cerebrovascular accident (CVA) D 3 chopped     Labs/tests ordered:  * No order type specified * Next appt:  08/07/2022

## 2022-07-04 ENCOUNTER — Encounter: Payer: Self-pay | Admitting: Internal Medicine

## 2022-07-04 ENCOUNTER — Encounter: Payer: Medicare Other | Admitting: Internal Medicine

## 2022-07-04 NOTE — Telephone Encounter (Signed)
Message routed to PCP Gupta, Anjali L, MD  

## 2022-07-27 LAB — CBC AND DIFFERENTIAL
HCT: 30 — AB (ref 36–46)
Hemoglobin: 9.5 — AB (ref 12.0–16.0)
Platelets: 454 10*3/uL — AB (ref 150–400)
WBC: 10.3

## 2022-07-27 LAB — CBC: RBC: 4.17 (ref 3.87–5.11)

## 2022-07-30 ENCOUNTER — Encounter: Payer: Self-pay | Admitting: Neurology

## 2022-07-30 DIAGNOSIS — I6359 Cerebral infarction due to unspecified occlusion or stenosis of other cerebral artery: Secondary | ICD-10-CM

## 2022-07-30 LAB — IRON,TIBC AND FERRITIN PANEL: Ferritin: 10

## 2022-08-01 ENCOUNTER — Encounter: Payer: Self-pay | Admitting: Neurology

## 2022-08-01 ENCOUNTER — Ambulatory Visit: Payer: BLUE CROSS/BLUE SHIELD | Admitting: Neurology

## 2022-08-01 VITALS — BP 110/62 | HR 63 | Ht 62.0 in | Wt 125.0 lb

## 2022-08-01 DIAGNOSIS — R29898 Other symptoms and signs involving the musculoskeletal system: Secondary | ICD-10-CM

## 2022-08-01 DIAGNOSIS — I6359 Cerebral infarction due to unspecified occlusion or stenosis of other cerebral artery: Secondary | ICD-10-CM | POA: Diagnosis not present

## 2022-08-01 NOTE — Patient Instructions (Signed)
Good to see you.  Schedule MRI brain without contrast  2. Consider increasing Lexapro dose to '20mg'$  daily  3. Continue treatment of anemia  4. If brain MRI looks fine, would do PT for left leg

## 2022-08-01 NOTE — Progress Notes (Signed)
NEUROLOGY FOLLOW UP OFFICE NOTE  Lynn [name] [id number] [birth year]  HISTORY OF PRESENT ILLNESS: I had the pleasure of seeing Lynn [name] in follow-up in the neurology clinic on 08/01/2022.  The patient was last seen a year ago for bilateral basal ganglia strokes that occurred in April 2021. Her sister Lynn [name] is present to provide additional information. Since her last visit, her sister Lynn [name] sent an update on MyChart that family has felt she has gone downhill cognitively in the last few months. Her speech appears more labored, she has little affect. Her anxiety and compulsiveness remain. She often repeats what has just been said or asks questions that have been answered again a few minutes later. She does have anemia (Hgb 10.6 in 05/2022, 9.5 in 07/2022) and refused iron for fear of bowel issues. She is currently in assisted living where medications administered. She needs help with bathing and dressing. She gets her meals in the dining hall. She has a motorized wheelchair she uses to get to the dining hall. She has completed physical therapy and speech therapy, and is supposed to walk once a day, but Lynn [name] has noticed a decline in her ability to do it. They note her right leg is a little bit weak, she feels short of breath, arms also feel weak but it's mostly her right leg. No numbness/tingling. She states her back is not bothering her all the time. Lynn [name] notes she used to walk more, but in the past 2-3 months, she is only doing half of her prior distance. Lynn [name] has noticed her right hand stays curled in. She denies any headaches or dizziness. She has a little problem with swallowing. She takes her pills with applesauce and has to concentrate on it. Water makes her cough so she does not drink too much. Mood is okay, she feels down sometimes. It appears she gets irritated with Lynn [name]'s reports during the visit. They note she gets frustrated more easily. She has some sleep difficulties, she states she  takes Trazodone only prn because it does not do much good. She says she joins activities at the facility but Lynn [name] shakes her head.    History on Initial Assessment 05/09/2017: This is a pleasant 77 yo RH woman with a history of hypertension, who presented for evaluation of gait unsteadiness. She had been seeing Neurosurgery for back pain. She reported symptoms started a couple of years ago, but worsened in the past 6-8 months. She had fractured her left foot and was doing PT, noting difficulties with heel-toe walking, worse when she closes her eyes. She has come very close to falling, she was drying her hair bending forward and pitched forward. She has numbness and tingling in both hands and feet up to the mid-calf region. She has occasional sharp pains in both feet. Over the past couple of years, she has noticed clumsiness in both hands, worse the past year. If she does much lifting, she starts having back pain. She denies any neck pain except when bending head forward too much. She has some stress incontinence, no bowel dysfunction. She has occasional headaches associated with eye issues. She has a little blurred vision with dry eye. Over the past 1.5 years, she has noticed a change in her speech, her voice has thickened and she is more hesitant when talking, worse this past year. She has also noticed some anxiety worsens it as well. She occasionally chokes on her food. She denies any family history of similar symptoms. She had an  MRI brain without contrast at [facility] last 02/28/17, images unavailable for review, there were no acute changes. There was mild FLAIR white matter changes, normal brain volume.   Update 12/07/2019: She presents today in a stretcher, with her sister present to provide additional information. Records were reviewed. Unfortunately she had bilateral strokes last month. She was brought to the ER on 4/20 for "slow thinking" and expressive aphasia. She was noted to have left-sided  weakness and expressive aphasia in the ER. Family had noticed she had slowed down in the past few years since moving in to her mother's home, in the past 6 months she had not cared for her appearance and was walking with a cane due to gait unsteadiness. I personally reviewed MRI brain without contrast which showed patchy acute infarcts involving the basal ganglia/corona radiata bilaterally, left greater than right. There was mildly age advanced chronic microvascular disease. CTA head and neck no significant stenosis. LDL elevated, HbA1c 5.1. During her hospitalization, she was noted to have mild dysarthria and psychomotor slowing, left UE 0/5, LLE 3/5 proximally, 4/5 distally, 4/5 right UE and LE with right foot drop. Etiology of stroke unclear, possibly small vessel disease, however given bilateral involvement, 30-day cardiac monitor was recommended. She was discharged on DAPT for 3 months, then Plavix alone. She had an episode of decreased responsiveness felt due to toxic/metabolic/infectious cause, WBC was 20.6. Outpatient cognitive testing was recommended, however unable to perform today due to her mental status. Patient is drowsy, arousable to say "hi," recognizing the examiner from our prior visits, able to follow simple commands on the right arm. Flaccid left, unable to move both LE much.   PAST MEDICAL HISTORY: Past Medical History:  Diagnosis Date   Anxiety    Aortic atherosclerosis (HCC)    Arthritis    Complication of anesthesia    per pt, slow to wake up past sedation.   CVA (cerebral vascular accident) ([place]) 11/10/2019   Depression    Diverticulosis    Hiatal hernia    Hyperplastic colon polyp    Hypertension    Neuropathy    Osteoarthritis    Osteoporosis    Renal mass    Stomach upset    has a senstive stomach   Throat clearing    has a cough at times   Urinary incontinence    Vitamin D deficiency     MEDICATIONS: Current Outpatient Medications on File Prior to Visit   Medication Sig Dispense Refill   acetaminophen (TYLENOL) 325 MG tablet Take 650 mg by mouth at bedtime.     acetaminophen (TYLENOL) 500 MG tablet Take 1,000 mg by mouth 2 (two) times daily as needed for moderate pain or mild pain.     atorvastatin (LIPITOR) 40 MG tablet Take 1 tablet (40 mg total) by mouth daily. 30 tablet 0   buPROPion (WELLBUTRIN XL) 150 MG 24 hr tablet Take 150 mg by mouth every morning.     clopidogrel (PLAVIX) 75 MG tablet Take 1 tablet (75 mg total) by mouth daily. 30 tablet 0   cycloSPORINE (RESTASIS) 0.05 % ophthalmic emulsion Place 1 drop into both eyes 2 (two) times daily.     escitalopram (LEXAPRO) 10 MG tablet Take 10 mg by mouth daily.     magnesium hydroxide (MILK OF MAGNESIA) 400 MG/5ML suspension If no BM in 3 days, give 30 cc Milk of Magnesium p.o. x 1 dose in 24 hours as needed (Do not use standing constipation orders for residents with  renal failure CFR less than 30. Contact MD for orders) (Physician Order)     melatonin 5 MG TABS Take 5 mg by mouth at bedtime.     metoprolol tartrate (LOPRESSOR) 25 MG tablet Take 12.5 mg by mouth 2 (two) times daily.     NON FORMULARY Diet: Regular, Chopped/Dysphagia 3   Special Instructions: Aspiration precautions. May have bacon per SLP.     Nutritional Supplements (BOOST PLUS PO) Take 1 Can by mouth daily as needed (Mid morning, Vanilla).     pantoprazole (PROTONIX) 20 MG tablet Take 20 mg by mouth daily.     senna (SENOKOT) 8.6 MG TABS tablet Take 2 tablets by mouth at bedtime as needed for mild constipation.     traZODone (DESYREL) 50 MG tablet Take 25 mg by mouth at bedtime as needed.     No current facility-administered medications on file prior to visit.    ALLERGIES: Allergies  Allergen Reactions   Chlorhexidine     FAMILY HISTORY: Family History  Problem Relation Age of Onset   Arthritis Mother    Colon cancer Father    COPD Father 57       died at 103   Throat cancer Brother    Breast cancer Other     Esophageal cancer Neg Hx    Rectal cancer Neg Hx    Stomach cancer Neg Hx    Stroke Neg Hx     SOCIAL HISTORY: Social History   Socioeconomic History   Marital status: Divorced    Spouse name: Not on file   Number of children: 1   Years of education: 16   Highest education level: Not on file  Occupational History   Occupation: retired  Tobacco Use   Smoking status: Former    Years: 10.00    Types: Cigarettes    Quit date: 12/21/1975    Years since quitting: 46.6   Smokeless tobacco: Never  Vaping Use   Vaping Use: Never used  Substance and Sexual Activity   Alcohol use: Not Currently    Comment: rarely   Drug use: No   Sexual activity: Not Currently  Other Topics Concern   Not on file  Social History Narrative   Divorced. Children: 1 son. Pt is at [employer]        Retired from [employer]  Education: college.       Hobbies: riding horses in the past, family time, enjoys some gardening      Right handed    Social Determinants of Health   Financial Resource Strain: Not on file  Food Insecurity: Not on file  Transportation Needs: Not on file  Physical Activity: Not on file  Stress: Not on file  Social Connections: Not on file  Intimate Partner Violence: Not on file     PHYSICAL EXAM: Vitals:   08/01/22 1129  BP: 110/62  Pulse: 63  SpO2: 96%   General: No acute distress Head:  Normocephalic/atraumatic Skin/Extremities: No rash, no edema Neurological Exam: alert and oriented to person, place, and time. There is mild dysarthria with halting speech. Fund of knowledge is appropriate.  Recent and remote memory are intact.  Attention and concentration are normal. MMSE 28/30    08/01/2022   11:00 AM  MMSE - Mini Mental State Exam  Orientation to time 4  Orientation to Place 5  Registration 3  Attention/ Calculation 4  Recall 3  Language- name 2 objects 2  Language- repeat 1  Language-  follow 3 step command 3  Language- read & follow  direction 1  Write a sentence 1  Copy design 1  Total score 28   Cranial nerves: Pupils equal, round. Extraocular movements intact with no nystagmus. Visual fields full.  No facial asymmetry.  Motor: Bulk and tone normal, muscle strength 5/5 throughout with no pronator drift.  Ataxia on left finger to nose. Gait: needed 2-person assist without walker, left leg buckling during ambulation.    IMPRESSION: This is a pleasant 77 yo RH woman with a history of hypertension, initially seen for gait unsteadiness felt due to neuropathy, who had bilateral basal ganglia strokes, L>R in April 2021. She has residual left-sided ataxia, however over the past few months has had regression in prior mobility gains. MMSE today 28/30. Exam concerning for left leg weakness, although she feels right leg is weaker. We discussed repeating brain MRI without contrast to assess for underlying structural abnormality. Physical therapy would be helpful. Consider increasing Lexapro to '20mg'$  daily after discussion with her treating physician, continue treatment of anemia. She was encouraged to continue exercise and socialization activities at ALF. Follow-up in 6 months, call for any changes.    Thank you for allowing me to participate in her care.  Please do not hesitate to call for any questions or concerns.    Ellouise Newer, M.D.   CC: Dr. Lyndel Safe

## 2022-08-07 ENCOUNTER — Other Ambulatory Visit: Payer: Self-pay

## 2022-08-07 ENCOUNTER — Encounter: Payer: Self-pay | Admitting: Internal Medicine

## 2022-08-07 ENCOUNTER — Non-Acute Institutional Stay: Payer: BLUE CROSS/BLUE SHIELD | Admitting: Internal Medicine

## 2022-08-07 VITALS — BP 134/78 | HR 66 | Temp 98.1°F | Resp 18 | Ht 62.0 in

## 2022-08-07 DIAGNOSIS — F419 Anxiety disorder, unspecified: Secondary | ICD-10-CM

## 2022-08-07 DIAGNOSIS — F32A Depression, unspecified: Secondary | ICD-10-CM

## 2022-08-07 DIAGNOSIS — F5101 Primary insomnia: Secondary | ICD-10-CM

## 2022-08-07 DIAGNOSIS — D509 Iron deficiency anemia, unspecified: Secondary | ICD-10-CM | POA: Diagnosis not present

## 2022-08-07 DIAGNOSIS — K5901 Slow transit constipation: Secondary | ICD-10-CM

## 2022-08-07 NOTE — Progress Notes (Signed)
abstraction

## 2022-08-08 ENCOUNTER — Encounter: Payer: Self-pay | Admitting: Internal Medicine

## 2022-08-08 NOTE — Progress Notes (Signed)
There was an error made in patients hemoglobin. Patient HGB was 9.5.

## 2022-08-08 NOTE — Progress Notes (Signed)
Location: [facility] of Service:  Clinic (12)  Provider:   Code Status:  Goals of Care:     08/07/2022    1:57 PM  Advanced Directives  Does Patient Have a Medical Advance Directive? Yes  Type of Paramedic of [place];Living will;Out of facility DNR (pink MOST or yellow form)  Does patient want to make changes to medical advance directive? No - Patient declined  Copy of [place] in Chart? Yes - validated most recent copy scanned in chart (See row information)     Chief Complaint  Patient presents with   Medical Management of Chronic Issues    1 month follow up. Patient wants to discuss trazadone     HPI: Patient is a 77 y.o. female seen today for an acute visit for Follow up of Anemia, Feeling sleepy on Trazodone, And does not want Miralax   Lives in [place] in [facility]   Patient has a history of hypertension, hyperlipidemia, depression and anxiety, osteoporosis   bilateral CVAs left greater than right in 2021 Followed By Dysphagia , Dysarthria,    Had RUQ Pain one month ago Labs were normal including Liver function But her Hgb was 9.3 and repeat HGB has been low Iron studies show low iron stores RUQ pain is resolved Refuses Iron supplements due to Upset stomach Occult Blood Negative   Patient also does not want Trazodone anymore as she thinks it makes her sleepy Does not want Miralax either   She was more distant today and seemed more depressed  Needs some help with her ADLS but is stable otherwise    Past Medical History:  Diagnosis Date   Anxiety    Aortic atherosclerosis (HCC)    Arthritis    Complication of anesthesia    per pt, slow to wake up past sedation.   CVA (cerebral vascular accident) ([place]) 11/10/2019   Depression    Diverticulosis    Hiatal hernia    Hyperplastic colon polyp    Hypertension    Neuropathy    Osteoarthritis    Osteoporosis    Renal mass    Stomach  upset    has a senstive stomach   Throat clearing    has a cough at times   Urinary incontinence    Vitamin D deficiency     Past Surgical History:  Procedure Laterality Date   COLONOSCOPY     FACIAL COSMETIC SURGERY     over 15 years ago   FINGER TENDON REPAIR     middle rt hand-   HAMMER TOE SURGERY  12/27/2011   Procedure: HAMMER TOE CORRECTION;  Surgeon: Wylene Simmer, MD;  Location: [facility];  Service: Orthopedics;  Laterality: Left;  2-4th    Allergies  Allergen Reactions   Chlorhexidine     Outpatient Encounter Medications as of 08/07/2022  Medication Sig   acetaminophen (TYLENOL) 325 MG tablet Take 650 mg by mouth at bedtime.   acetaminophen (TYLENOL) 500 MG tablet Take 1,000 mg by mouth 2 (two) times daily as needed for moderate pain or mild pain.   atorvastatin (LIPITOR) 40 MG tablet Take 1 tablet (40 mg total) by mouth daily.   buPROPion (WELLBUTRIN XL) 150 MG 24 hr tablet Take 150 mg by mouth every morning.   clopidogrel (PLAVIX) 75 MG tablet Take 1 tablet (75 mg total) by mouth daily.   cycloSPORINE (RESTASIS) 0.05 % ophthalmic emulsion Place 1 drop into both  eyes 2 (two) times daily.   escitalopram (LEXAPRO) 10 MG tablet Take 10 mg by mouth daily.   magnesium hydroxide (MILK OF MAGNESIA) 400 MG/5ML suspension If no BM in 3 days, give 30 cc Milk of Magnesium p.o. x 1 dose in 24 hours as needed (Do not use standing constipation orders for residents with renal failure CFR less than 30. Contact MD for orders) (Physician Order)   melatonin 5 MG TABS Take 5 mg by mouth at bedtime.   metoprolol tartrate (LOPRESSOR) 25 MG tablet Take 12.5 mg by mouth 2 (two) times daily.   NON FORMULARY Diet: Regular, Chopped/Dysphagia 3   Special Instructions: Aspiration precautions. May have bacon per SLP.   Nutritional Supplements (BOOST PLUS PO) Take 1 Can by mouth daily as needed (Mid morning, Vanilla).   pantoprazole (PROTONIX) 20 MG tablet Take 20 mg by mouth daily.    senna (SENOKOT) 8.6 MG TABS tablet Take 2 tablets by mouth at bedtime as needed for mild constipation.   traZODone (DESYREL) 50 MG tablet Take 25 mg by mouth at bedtime as needed. (Patient not taking: Reported on 08/07/2022)   No facility-administered encounter medications on file as of 08/07/2022.    Review of Systems:  Review of Systems  Constitutional:  Negative for activity change and appetite change.  HENT: Negative.    Respiratory:  Negative for cough and shortness of breath.   Cardiovascular:  Negative for leg swelling.  Gastrointestinal:  Negative for constipation.  Genitourinary: Negative.   Musculoskeletal:  Positive for gait problem. Negative for arthralgias and myalgias.  Skin: Negative.   Neurological:  Negative for dizziness and weakness.  Psychiatric/Behavioral:  Positive for dysphoric mood. Negative for confusion and sleep disturbance.     Health Maintenance  Topic Date Due   COVID-19 Vaccine (7 - 2023-24 season) 08/23/2022 (Originally 07/23/2022)   DTaP/Tdap/Td (2 - Td or Tdap) 01/24/2031   Pneumonia Vaccine 87+ Years old  Completed   INFLUENZA VACCINE  Completed   Hepatitis C Screening  Completed   Zoster Vaccines- Shingrix  Completed   HPV VACCINES  Aged Out   DEXA SCAN  Discontinued   COLONOSCOPY (Pts 45-62yr Insurance coverage will need to be confirmed)  Discontinued    Physical Exam: Vitals:   08/07/22 1408  BP: 134/78  Pulse: 66  Resp: 18  Temp: 98.1 F (36.7 C)  TempSrc: Temporal  SpO2: 99%  Height: '5\' 2"'$  (1.575 m)   Body mass index is 22.86 kg/m. Physical Exam Vitals reviewed.  Constitutional:      Appearance: Normal appearance.  HENT:     Head: Normocephalic.     Nose: Nose normal.     Mouth/Throat:     Mouth: Mucous membranes are moist.     Pharynx: Oropharynx is clear.  Eyes:     Pupils: Pupils are equal, round, and reactive to light.  Cardiovascular:     Rate and Rhythm: Normal rate and regular rhythm.     Pulses: Normal pulses.      Heart sounds: Normal heart sounds. No murmur heard. Pulmonary:     Effort: Pulmonary effort is normal.     Breath sounds: Normal breath sounds.  Abdominal:     General: Abdomen is flat. Bowel sounds are normal.     Palpations: Abdomen is soft.  Musculoskeletal:        General: No swelling.     Cervical back: Neck supple.  Skin:    General: Skin is warm.  Neurological:  Mental Status: She is alert.     Comments: 3/5 in UE and LE Gait not tested   Psychiatric:        Mood and Affect: Mood normal.        Thought Content: Thought content normal.     Labs reviewed: Basic Metabolic Panel: Recent Labs    08/14/21 0942 11/28/21 0000 06/18/22 0000  NA 139 143 141  141  K 3.9 4.3 4.1  4.1  CL 105 108 106  106  CO2 '24 22 21  21  '$ GLUCOSE 86  --   --   BUN '16 14 14  14  '$ CREATININE 0.84 0.8 0.7  0.7  CALCIUM 9.3 9.6 9.5  9.5  TSH  --  1.68  --    Liver Function Tests: Recent Labs    06/18/22 0000  AST 16  16  ALT 12  12  ALKPHOS 97  97  ALBUMIN 3.6  3.6   No results for input(s): "LIPASE", "AMYLASE" in the last 8760 hours. No results for input(s): "AMMONIA" in the last 8760 hours. CBC: Recent Labs    08/14/21 0942 11/28/21 0000 06/18/22 0000 06/21/22 0000 07/27/22 0000  WBC 12.2*   < > 8.0 10.6 10.3  HGB 12.8   < > 9.3* 9.8* 8.5*  HCT 40.8   < > 30* 31* 30*  MCV 93.6  --   --   --   --   PLT 274   < > 378 435* 454*   < > = values in this interval not displayed.   Lipid Panel: Recent Labs    11/28/21 0000  CHOL 151  HDL 65  LDLCALC 71  TRIG 77   Lab Results  Component Value Date   HGBA1C 5.1 11/11/2019    Procedures since last visit: No results found.  Assessment/Plan 1. Iron deficiency anemia, unspecified iron deficiency anemia type Refusing PO iron Discussed with sister will Make referral to Hematology for further work up Colonoscopy in 2020 Occult negative   2. Slow transit constipation Refusing Miralax Continue on  Senna  3. Anxiety and depression On Wellbutrin and Lexapro Seemed more depressed Will ask her to see Dr Casimiro Needle again  4. Primary insomnia Discontinue Trazodone PRN Melatonin  Other issues 6. Primary hypertension On low dose of Metoprolol   7. Pure hypercholesterolemia statin    Recent Labs    11/28/21 0000  CHOL 151  HDL 65  LDLCALC 71  TRIG 77    8. 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 Gastroesophageal reflux disease without esophagitis On Protonix   10. Dysphagia following cerebrovascular accident (CVA) D 3 chopped   Labs/tests ordered:   Next appt:  08/07/2022 Total time spent in this patient care encounter was  45_  minutes; greater than 50% of the visit spent counseling patient and staff, reviewing records , Labs and coordinating care for problems addressed at this encounter.

## 2022-08-21 ENCOUNTER — Other Ambulatory Visit: Payer: Self-pay

## 2022-08-21 DIAGNOSIS — D509 Iron deficiency anemia, unspecified: Secondary | ICD-10-CM

## 2022-08-22 ENCOUNTER — Encounter: Payer: Self-pay | Admitting: Internal Medicine

## 2022-08-22 ENCOUNTER — Inpatient Hospital Stay: Payer: Medicare Other

## 2022-08-22 ENCOUNTER — Inpatient Hospital Stay: Payer: Medicare Other | Attending: Internal Medicine | Admitting: Internal Medicine

## 2022-08-22 ENCOUNTER — Other Ambulatory Visit: Payer: Self-pay

## 2022-08-22 VITALS — BP 114/61 | HR 64 | Temp 97.7°F | Resp 15 | Ht 62.0 in | Wt 125.0 lb

## 2022-08-22 DIAGNOSIS — D500 Iron deficiency anemia secondary to blood loss (chronic): Secondary | ICD-10-CM | POA: Insufficient documentation

## 2022-08-22 DIAGNOSIS — D509 Iron deficiency anemia, unspecified: Secondary | ICD-10-CM

## 2022-08-22 DIAGNOSIS — Z87891 Personal history of nicotine dependence: Secondary | ICD-10-CM | POA: Diagnosis not present

## 2022-08-22 DIAGNOSIS — Z8673 Personal history of transient ischemic attack (TIA), and cerebral infarction without residual deficits: Secondary | ICD-10-CM | POA: Insufficient documentation

## 2022-08-22 DIAGNOSIS — I10 Essential (primary) hypertension: Secondary | ICD-10-CM | POA: Insufficient documentation

## 2022-08-22 DIAGNOSIS — F419 Anxiety disorder, unspecified: Secondary | ICD-10-CM | POA: Insufficient documentation

## 2022-08-22 DIAGNOSIS — Z79899 Other long term (current) drug therapy: Secondary | ICD-10-CM | POA: Insufficient documentation

## 2022-08-22 DIAGNOSIS — Z7902 Long term (current) use of antithrombotics/antiplatelets: Secondary | ICD-10-CM | POA: Diagnosis not present

## 2022-08-22 DIAGNOSIS — E785 Hyperlipidemia, unspecified: Secondary | ICD-10-CM | POA: Diagnosis not present

## 2022-08-22 LAB — IRON AND IRON BINDING CAPACITY (CC-WL,HP ONLY)
Iron: 17 ug/dL — ABNORMAL LOW (ref 28–170)
Saturation Ratios: 4 % — ABNORMAL LOW (ref 10.4–31.8)
TIBC: 385 ug/dL (ref 250–450)
UIBC: 368 ug/dL (ref 148–442)

## 2022-08-22 LAB — CBC WITH DIFFERENTIAL/PLATELET
Abs Immature Granulocytes: 0.09 10*3/uL — ABNORMAL HIGH (ref 0.00–0.07)
Basophils Absolute: 0.1 10*3/uL (ref 0.0–0.1)
Basophils Relative: 1 %
Eosinophils Absolute: 0.2 10*3/uL (ref 0.0–0.5)
Eosinophils Relative: 2 %
HCT: 31.9 % — ABNORMAL LOW (ref 36.0–46.0)
Hemoglobin: 9.5 g/dL — ABNORMAL LOW (ref 12.0–15.0)
Immature Granulocytes: 1 %
Lymphocytes Relative: 11 %
Lymphs Abs: 1.3 10*3/uL (ref 0.7–4.0)
MCH: 21.9 pg — ABNORMAL LOW (ref 26.0–34.0)
MCHC: 29.8 g/dL — ABNORMAL LOW (ref 30.0–36.0)
MCV: 73.5 fL — ABNORMAL LOW (ref 80.0–100.0)
Monocytes Absolute: 0.9 10*3/uL (ref 0.1–1.0)
Monocytes Relative: 7 %
Neutro Abs: 9.4 10*3/uL — ABNORMAL HIGH (ref 1.7–7.7)
Neutrophils Relative %: 78 %
Platelets: 480 10*3/uL — ABNORMAL HIGH (ref 150–400)
RBC: 4.34 MIL/uL (ref 3.87–5.11)
RDW: 18.6 % — ABNORMAL HIGH (ref 11.5–15.5)
WBC: 11.9 10*3/uL — ABNORMAL HIGH (ref 4.0–10.5)
nRBC: 0 % (ref 0.0–0.2)

## 2022-08-22 LAB — COMPREHENSIVE METABOLIC PANEL
ALT: 15 U/L (ref 0–44)
AST: 14 U/L — ABNORMAL LOW (ref 15–41)
Albumin: 3.7 g/dL (ref 3.5–5.0)
Alkaline Phosphatase: 84 U/L (ref 38–126)
Anion gap: 5 (ref 5–15)
BUN: 20 mg/dL (ref 8–23)
CO2: 27 mmol/L (ref 22–32)
Calcium: 9.7 mg/dL (ref 8.9–10.3)
Chloride: 107 mmol/L (ref 98–111)
Creatinine, Ser: 0.87 mg/dL (ref 0.44–1.00)
GFR, Estimated: >60 mL/min (ref >60)
Glucose, Bld: 87 mg/dL (ref 70–99)
Potassium: 3.9 mmol/L (ref 3.5–5.1)
Sodium: 139 mmol/L (ref 135–145)
Total Bilirubin: 0.4 mg/dL (ref 0.3–1.2)
Total Protein: 6.6 g/dL (ref 6.5–8.1)

## 2022-08-22 LAB — VITAMIN B12: Vitamin B-12: 304 pg/mL (ref 180–914)

## 2022-08-22 LAB — FERRITIN: Ferritin: 6 ng/mL — ABNORMAL LOW (ref 11–307)

## 2022-08-22 LAB — FOLATE: Folate: 6.1 ng/mL (ref >5.9)

## 2022-08-22 NOTE — Progress Notes (Signed)
[facility] Telephone:([phone]   Fax:([phone]  CONSULT NOTE  REFERRING PHYSICIAN:Dr. Veleta Miners  REASON FOR CONSULTATION:  77 years old white female with iron deficiency anemia  HPI Lynn [name] is a 77 y.o. female with past medical history significant for hypertension, osteoarthritis, anxiety, osteoporosis, vitamin D deficiency, stroke in April 2021, dyslipidemia as well as hiatal hernia.  The patient is currently a resident of [facility].  She was seen recently by her primary care physician and noticed on blood work to have persistent anemia.  Her hemoglobin on July 27, 2022 was 9.5.  She had upper endoscopy and colonoscopy in August 2020 that were unremarkable except for duodenitis, hiatal hernia and diverticulosis.  The patient will was given prescription for iron tablet by her primary care physician but she could not tolerated and declined to take it.  She was referred to me today for evaluation and recommendation regarding her condition.  She is currently on dysphagia 3 diet.  She does not eat a lot of protein especially red meat but she has no dietary restrictions.  She denied having any bleeding, bruises or ecchymosis.  She has no epistaxis, hematemesis, hemoptysis, melena or hematochezia. Family history significant for father died from colon cancer at age 28.  Mother still alive at age 43 and she had brother with throat cancer. The patient is divorced and has 1 son.  She lives in the [facility] facility.  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  She was accompanied today by her sister, Lynn [name].  She has a history of smoking for few years in her early age but no history of alcohol or drug abuse. HPI  Past Medical History:  Diagnosis Date   Anxiety    Aortic atherosclerosis (HCC)    Arthritis    Complication of anesthesia    per pt, slow to wake up past sedation.    CVA (cerebral vascular accident) ([place]) 11/10/2019   Depression    Diverticulosis    Hiatal hernia    Hyperplastic colon polyp    Hypertension    Neuropathy    Osteoarthritis    Osteoporosis    Renal mass    Stomach upset    has a senstive stomach   Throat clearing    has a cough at times   Urinary incontinence    Vitamin D deficiency     Past Surgical History:  Procedure Laterality Date   COLONOSCOPY     FACIAL COSMETIC SURGERY     over 15 years ago   FINGER TENDON REPAIR     middle rt hand-   HAMMER TOE SURGERY  12/27/2011   Procedure: HAMMER TOE CORRECTION;  Surgeon: Wylene Simmer, MD;  Location: [facility];  Service: Orthopedics;  Laterality: Left;  2-4th    Family History  Problem Relation Age of Onset   Arthritis Mother    Colon cancer Father    COPD Father 3       died at 65   Throat cancer Brother    Breast cancer Other    Esophageal cancer Neg Hx    Rectal cancer Neg Hx    Stomach cancer Neg Hx    Stroke Neg Hx     Social History Social History   Tobacco Use   Smoking status: Former    Years: 10.00    Types: Cigarettes    Quit date: 12/21/1975  Years since quitting: 46.7   Smokeless tobacco: Never  Vaping Use   Vaping Use: Never used  Substance Use Topics   Alcohol use: Not Currently    Comment: rarely   Drug use: No    Allergies  Allergen Reactions   Chlorhexidine     Current Outpatient Medications  Medication Sig Dispense Refill   acetaminophen (TYLENOL) 325 MG tablet Take 650 mg by mouth at bedtime.     acetaminophen (TYLENOL) 500 MG tablet Take 1,000 mg by mouth 2 (two) times daily as needed for moderate pain or mild pain.     atorvastatin (LIPITOR) 40 MG tablet Take 1 tablet (40 mg total) by mouth daily. 30 tablet 0   buPROPion (WELLBUTRIN XL) 150 MG 24 hr tablet Take 150 mg by mouth every morning.     clopidogrel (PLAVIX) 75 MG tablet Take 1 tablet (75 mg total) by mouth daily. 30 tablet 0   cycloSPORINE (RESTASIS)  0.05 % ophthalmic emulsion Place 1 drop into both eyes 2 (two) times daily.     escitalopram (LEXAPRO) 10 MG tablet Take 10 mg by mouth daily.     magnesium hydroxide (MILK OF MAGNESIA) 400 MG/5ML suspension If no BM in 3 days, give 30 cc Milk of Magnesium p.o. x 1 dose in 24 hours as needed (Do not use standing constipation orders for residents with renal failure CFR less than 30. Contact MD for orders) (Physician Order)     melatonin 5 MG TABS Take 5 mg by mouth at bedtime.     metoprolol tartrate (LOPRESSOR) 25 MG tablet Take 12.5 mg by mouth 2 (two) times daily.     NON FORMULARY Diet: Regular, Chopped/Dysphagia 3   Special Instructions: Aspiration precautions. May have bacon per SLP.     Nutritional Supplements (BOOST PLUS PO) Take 1 Can by mouth daily as needed (Mid morning, Vanilla).     pantoprazole (PROTONIX) 20 MG tablet Take 20 mg by mouth daily.     senna (SENOKOT) 8.6 MG TABS tablet Take 2 tablets by mouth at bedtime as needed for mild constipation.     No current facility-administered medications for this visit.    Review of Systems  Constitutional: positive for fatigue Eyes: negative Ears, nose, mouth, throat, and face: negative Respiratory: positive for dyspnea on exertion Cardiovascular: negative Gastrointestinal: negative Genitourinary:negative Integument/breast: negative Hematologic/lymphatic: negative Musculoskeletal:positive for muscle weakness Neurological: positive for speech problems Behavioral/Psych: negative Endocrine: negative Allergic/Immunologic: negative  Physical Exam  HCW:CBJSE, healthy, no distress, well nourished, and well developed SKIN: skin color, texture, turgor are normal, no rashes or significant lesions HEAD: Normocephalic, No masses, lesions, tenderness or abnormalities EYES: normal, PERRLA, Conjunctiva are pink and non-injected EARS: External ears normal, Canals clear OROPHARYNX:no exudate, no erythema, and lips, buccal mucosa, and  tongue normal  NECK: supple, no adenopathy, no JVD LYMPH:  no palpable lymphadenopathy, no hepatosplenomegaly BREAST:not examined LUNGS: clear to auscultation , and palpation HEART: regular rate & rhythm, no murmurs, and no gallops ABDOMEN:abdomen soft, non-tender, normal bowel sounds, and no masses or organomegaly BACK: Back symmetric, no curvature., No CVA tenderness EXTREMITIES:no joint deformities, effusion, or inflammation, no edema  NEURO: alert & oriented x 3 with fluent speech, no focal motor/sensory deficits  PERFORMANCE STATUS: ECOG 1  LABORATORY DATA: Lab Results  Component Value Date   WBC 11.9 (H) 08/22/2022   HGB 9.5 (L) 08/22/2022   HCT 31.9 (L) 08/22/2022   MCV 73.5 (L) 08/22/2022   PLT 480 (H) 08/22/2022  Chemistry      Component Value Date/Time   NA 139 08/22/2022 1122   NA 141 06/18/2022 0000   NA 141 06/18/2022 0000   K 3.9 08/22/2022 1122   CL 107 08/22/2022 1122   CO2 27 08/22/2022 1122   BUN 20 08/22/2022 1122   BUN 14 06/18/2022 0000   BUN 14 06/18/2022 0000   CREATININE 0.87 08/22/2022 1122   CREATININE 0.91 04/24/2013 0953   GLU 78 06/18/2022 0000   GLU 78 06/18/2022 0000      Component Value Date/Time   CALCIUM 9.7 08/22/2022 1122   ALKPHOS 84 08/22/2022 1122   AST 14 (L) 08/22/2022 1122   ALT 15 08/22/2022 1122   BILITOT 0.4 08/22/2022 1122   BILITOT 0.4 08/05/2018 1657       RADIOGRAPHIC STUDIES: No results found.  ASSESSMENT: This is a very pleasant 77 years old white female with iron deficiency anemia of unclear etiology.  The patient had previous gastrointestinal blood work in 2020 that was unremarkable.  She also had stool for Hemoccult that was negative recently. This could be secondary to inadequate dietary intake.  She has intolerance to the oral iron tablets.  PLAN: I had a lengthy discussion with the patient and her sister today about her current condition and treatment options. I ordered several studies today  including repeat CBC that showed hemoglobin of 9.5 and hematocrit 31.9%.  She has a slightly elevated white blood count of 11.9 and elevated platelets count of 480,000. The patient has low MCV of 73.5%.  Iron study showed low serum iron of 17 with iron saturation of 4% and ferritin level is still pending.  The patient has normal vitamin B12 and serum folate. I recommended for the patient to proceed with iron infusion with Feraheme 510 Mg IV weekly for 2 weeks.  She will start the first dose of her treatment tomorrow. I will see her back for follow-up visit in 2 months for evaluation with repeat blood work. The patient was advised to call immediately if she has any concerning symptoms in the interval. The patient voices understanding of current disease status and treatment options and is in agreement with the current care plan.  All questions were answered. The patient knows to call the clinic with any problems, questions or concerns. We can certainly see the patient much sooner if necessary.  Thank you so much for allowing me to participate in the care of [place]. I will continue to follow up the patient with you and assist in her care.  The total time spent in the appointment was 60 minutes.  Disclaimer: This note was dictated with voice recognition software. Similar sounding words can inadvertently be transcribed and may not be corrected upon review.   Eilleen Kempf August 22, 2022, 12:14 PM

## 2022-08-23 ENCOUNTER — Inpatient Hospital Stay: Payer: Medicare Other | Attending: Internal Medicine

## 2022-08-23 ENCOUNTER — Encounter: Payer: Self-pay | Admitting: Internal Medicine

## 2022-08-23 VITALS — BP 106/95 | HR 70 | Temp 97.9°F | Resp 16

## 2022-08-23 DIAGNOSIS — Z79899 Other long term (current) drug therapy: Secondary | ICD-10-CM | POA: Insufficient documentation

## 2022-08-23 DIAGNOSIS — D500 Iron deficiency anemia secondary to blood loss (chronic): Secondary | ICD-10-CM

## 2022-08-23 DIAGNOSIS — D509 Iron deficiency anemia, unspecified: Secondary | ICD-10-CM | POA: Insufficient documentation

## 2022-08-23 MED ORDER — SODIUM CHLORIDE 0.9 % IV SOLN: Freq: Once | INTRAVENOUS | Status: AC

## 2022-08-23 MED ORDER — SODIUM CHLORIDE 0.9 % IV SOLN
510.0000 mg | Freq: Once | INTRAVENOUS | Status: AC
  Administered 2022-08-23: 510 mg via INTRAVENOUS
  Filled 2022-08-23: qty 510

## 2022-08-23 MED ORDER — LORATADINE 10 MG PO TABS
10.0000 mg | ORAL_TABLET | Freq: Once | ORAL | Status: AC
  Administered 2022-08-23: 10 mg via ORAL
  Filled 2022-08-23: qty 1

## 2022-08-23 NOTE — Patient Instructions (Signed)

## 2022-08-28 ENCOUNTER — Telehealth: Payer: Self-pay | Admitting: Internal Medicine

## 2022-08-28 NOTE — Telephone Encounter (Signed)
Called patient regarding upcoming February/May appointments, patient is notified.

## 2022-08-30 ENCOUNTER — Inpatient Hospital Stay: Payer: Medicare Other

## 2022-08-30 VITALS — BP 124/76 | HR 68 | Resp 16

## 2022-08-30 DIAGNOSIS — D500 Iron deficiency anemia secondary to blood loss (chronic): Secondary | ICD-10-CM

## 2022-08-30 DIAGNOSIS — D509 Iron deficiency anemia, unspecified: Secondary | ICD-10-CM | POA: Diagnosis not present

## 2022-08-30 MED ORDER — SODIUM CHLORIDE 0.9 % IV SOLN: Freq: Once | INTRAVENOUS | Status: AC

## 2022-08-30 MED ORDER — LORATADINE 10 MG PO TABS
10.0000 mg | ORAL_TABLET | Freq: Once | ORAL | Status: AC
  Administered 2022-08-30: 10 mg via ORAL
  Filled 2022-08-30: qty 1

## 2022-08-30 MED ORDER — SODIUM CHLORIDE 0.9 % IV SOLN
510.0000 mg | Freq: Once | INTRAVENOUS | Status: AC
  Administered 2022-08-30: 510 mg via INTRAVENOUS
  Filled 2022-08-30: qty 510

## 2022-08-30 NOTE — Patient Instructions (Addendum)

## 2022-09-05 ENCOUNTER — Other Ambulatory Visit: Payer: Self-pay | Admitting: Orthopedic Surgery

## 2022-09-05 DIAGNOSIS — F5101 Primary insomnia: Secondary | ICD-10-CM

## 2022-09-05 MED ORDER — ESZOPICLONE 2 MG PO TABS: 2.0000 mg | ORAL_TABLET | Freq: Every day | ORAL | 3 refills | Status: DC

## 2022-09-20 ENCOUNTER — Other Ambulatory Visit: Payer: Self-pay

## 2022-09-20 ENCOUNTER — Telehealth: Payer: Self-pay | Admitting: Anesthesiology

## 2022-09-20 DIAGNOSIS — I6359 Cerebral infarction due to unspecified occlusion or stenosis of other cerebral artery: Secondary | ICD-10-CM

## 2022-09-20 NOTE — Telephone Encounter (Signed)
Sent to [facility]

## 2022-09-20 NOTE — Telephone Encounter (Signed)
Sharyn Lull from [facility] called stating they need a new Authorization for pt's procedure that is scheduled for 09/24/2022. CPT [id number],  NPI [id number]. Call back number [phone].

## 2022-09-24 ENCOUNTER — Encounter: Payer: Self-pay | Admitting: Neurology

## 2022-10-04 ENCOUNTER — Encounter: Payer: Self-pay | Admitting: Neurology

## 2022-10-04 ENCOUNTER — Telehealth: Payer: BLUE CROSS/BLUE SHIELD

## 2022-10-04 ENCOUNTER — Encounter: Payer: Self-pay | Admitting: Internal Medicine

## 2022-10-04 NOTE — Telephone Encounter (Signed)
Refill request received from pharmacy for atorvastatin 40 mg. Patient has not received medication since 01/06/2020 30 tablets with no refills. Is patient suppose to still be taking medication.  Message routed to Dr. Veleta Miners

## 2022-10-13 ENCOUNTER — Other Ambulatory Visit (HOSPITAL_COMMUNITY): Payer: Medicare Other

## 2022-10-16 ENCOUNTER — Ambulatory Visit: Payer: Medicare Other | Admitting: Internal Medicine

## 2022-10-17 ENCOUNTER — Inpatient Hospital Stay: Payer: Medicare Other

## 2022-10-17 ENCOUNTER — Inpatient Hospital Stay: Payer: Medicare Other | Attending: Internal Medicine | Admitting: Internal Medicine

## 2022-10-17 ENCOUNTER — Other Ambulatory Visit: Payer: Self-pay

## 2022-10-17 VITALS — BP 127/69 | HR 60 | Temp 97.6°F | Resp 16 | Wt 126.8 lb

## 2022-10-17 DIAGNOSIS — R5383 Other fatigue: Secondary | ICD-10-CM | POA: Diagnosis not present

## 2022-10-17 DIAGNOSIS — Z7902 Long term (current) use of antithrombotics/antiplatelets: Secondary | ICD-10-CM | POA: Insufficient documentation

## 2022-10-17 DIAGNOSIS — Z79899 Other long term (current) drug therapy: Secondary | ICD-10-CM | POA: Diagnosis not present

## 2022-10-17 DIAGNOSIS — D509 Iron deficiency anemia, unspecified: Secondary | ICD-10-CM | POA: Diagnosis present

## 2022-10-17 DIAGNOSIS — D500 Iron deficiency anemia secondary to blood loss (chronic): Secondary | ICD-10-CM | POA: Diagnosis not present

## 2022-10-17 DIAGNOSIS — I10 Essential (primary) hypertension: Secondary | ICD-10-CM | POA: Diagnosis not present

## 2022-10-17 DIAGNOSIS — Z8719 Personal history of other diseases of the digestive system: Secondary | ICD-10-CM | POA: Insufficient documentation

## 2022-10-17 DIAGNOSIS — Z8673 Personal history of transient ischemic attack (TIA), and cerebral infarction without residual deficits: Secondary | ICD-10-CM | POA: Insufficient documentation

## 2022-10-17 LAB — CMP (CANCER CENTER ONLY)
ALT: 23 U/L (ref 0–44)
AST: 20 U/L (ref 15–41)
Albumin: 3.9 g/dL (ref 3.5–5.0)
Alkaline Phosphatase: 78 U/L (ref 38–126)
Anion gap: 9 (ref 5–15)
BUN: 20 mg/dL (ref 8–23)
CO2: 23 mmol/L (ref 22–32)
Calcium: 9.3 mg/dL (ref 8.9–10.3)
Chloride: 104 mmol/L (ref 98–111)
Creatinine: 0.95 mg/dL (ref 0.44–1.00)
GFR, Estimated: >60 mL/min (ref >60)
Glucose, Bld: 111 mg/dL — ABNORMAL HIGH (ref 70–99)
Potassium: 4.1 mmol/L (ref 3.5–5.1)
Sodium: 136 mmol/L (ref 135–145)
Total Bilirubin: 0.2 mg/dL — ABNORMAL LOW (ref 0.3–1.2)
Total Protein: 6.2 g/dL — ABNORMAL LOW (ref 6.5–8.1)

## 2022-10-17 LAB — CBC WITH DIFFERENTIAL (CANCER CENTER ONLY)
Abs Immature Granulocytes: 0.08 10*3/uL — ABNORMAL HIGH (ref 0.00–0.07)
Basophils Absolute: 0 10*3/uL (ref 0.0–0.1)
Basophils Relative: 0 %
Eosinophils Absolute: 0.1 10*3/uL (ref 0.0–0.5)
Eosinophils Relative: 1 %
HCT: 42.7 % (ref 36.0–46.0)
Hemoglobin: 13.8 g/dL (ref 12.0–15.0)
Immature Granulocytes: 1 %
Lymphocytes Relative: 9 %
Lymphs Abs: 1 10*3/uL (ref 0.7–4.0)
MCH: 28.6 pg (ref 26.0–34.0)
MCHC: 32.3 g/dL (ref 30.0–36.0)
MCV: 88.4 fL (ref 80.0–100.0)
Monocytes Absolute: 0.7 10*3/uL (ref 0.1–1.0)
Monocytes Relative: 7 %
Neutro Abs: 8.8 10*3/uL — ABNORMAL HIGH (ref 1.7–7.7)
Neutrophils Relative %: 82 %
Platelet Count: 305 10*3/uL (ref 150–400)
RBC: 4.83 MIL/uL (ref 3.87–5.11)
WBC Count: 10.8 10*3/uL — ABNORMAL HIGH (ref 4.0–10.5)
nRBC: 0 % (ref 0.0–0.2)

## 2022-10-17 LAB — IRON AND IRON BINDING CAPACITY (CC-WL,HP ONLY)
Iron: 46 ug/dL (ref 28–170)
Saturation Ratios: 17 % (ref 10.4–31.8)
TIBC: 265 ug/dL (ref 250–450)
UIBC: 219 ug/dL

## 2022-10-17 NOTE — Progress Notes (Signed)
[facility] Telephone:(336) [phone]   Fax:(336) [phone]  OFFICE PROGRESS NOTE  [name], MD [name] [address]  DIAGNOSIS: Iron deficiency anemia of unclear etiology.  The patient had previous gastrointestinal blood work in 2020 that was unremarkable.  She also had stool for Hemoccult that was negative recently. This could be secondary to inadequate dietary intake.  She has intolerance to the oral iron tablets.  PRIOR THERAPY: Feraheme infusion 510 Mg IV weekly for 2 weeks  CURRENT THERAPY: Observation  INTERVAL HISTORY: Lynn [name] 77 y.o. female returns to the clinic today for follow-up visit accompanied by her daughter.  The patient is feeling fine today with no concerning complaints.  She did not notice a big difference after the Feraheme infusion but her daughter notices improvement in her general condition.  She has no current chest pain, shortness of breath, cough or hemoptysis.  She has no nausea, vomiting, diarrhea or constipation.  She has no headache or visual changes.  She is here today for evaluation with repeat blood work.  MEDICAL HISTORY: Past Medical History:  Diagnosis Date   Anxiety    Aortic atherosclerosis (HCC)    Arthritis    Complication of anesthesia    per pt, slow to wake up past sedation.   CVA (cerebral vascular accident) ([facility]) 11/10/2019   Depression    Diverticulosis    Hiatal hernia    Hyperplastic colon polyp    Hypertension    Neuropathy    Osteoarthritis    Osteoporosis    Renal mass    Stomach upset    has a senstive stomach   Throat clearing    has a cough at times   Urinary incontinence    Vitamin D deficiency     ALLERGIES:  is allergic to chlorhexidine.  MEDICATIONS:  Current Outpatient Medications  Medication Sig Dispense Refill   acetaminophen (TYLENOL) 325 MG tablet Take 650 mg by mouth at bedtime.     acetaminophen (TYLENOL) 500 MG tablet Take 1,000 mg by mouth 2  (two) times daily as needed for moderate pain or mild pain.     atorvastatin (LIPITOR) 40 MG tablet Take 1 tablet (40 mg total) by mouth daily. 30 tablet 0   buPROPion (WELLBUTRIN XL) 150 MG 24 hr tablet Take 150 mg by mouth every morning.     clopidogrel (PLAVIX) 75 MG tablet Take 1 tablet (75 mg total) by mouth daily. 30 tablet 0   cycloSPORINE (RESTASIS) 0.05 % ophthalmic emulsion Place 1 drop into both eyes 2 (two) times daily.     escitalopram (LEXAPRO) 10 MG tablet Take 10 mg by mouth daily.     eszopiclone (LUNESTA) 2 MG TABS tablet Take 1 tablet (2 mg total) by mouth at bedtime. Take immediately before bedtime 30 tablet 3   magnesium hydroxide (MILK OF MAGNESIA) 400 MG/5ML suspension If no BM in 3 days, give 30 cc Milk of Magnesium p.o. x 1 dose in 24 hours as needed (Do not use standing constipation orders for residents with renal failure CFR less than 30. Contact MD for orders) (Physician Order)     melatonin 5 MG TABS Take 5 mg by mouth at bedtime.     metoprolol tartrate (LOPRESSOR) 25 MG tablet Take 12.5 mg by mouth 2 (two) times daily.     NON FORMULARY Diet: Regular, Chopped/Dysphagia 3   Special Instructions: Aspiration precautions. May have bacon per SLP.  Nutritional Supplements (BOOST PLUS PO) Take 1 Can by mouth daily as needed (Mid morning, Vanilla).     pantoprazole (PROTONIX) 20 MG tablet Take 20 mg by mouth daily.     senna (SENOKOT) 8.6 MG TABS tablet Take 2 tablets by mouth at bedtime as needed for mild constipation.     traZODone (DESYREL) 50 MG tablet Take 25 mg by mouth at bedtime as needed for sleep (insomnia).     No current facility-administered medications for this visit.    SURGICAL HISTORY:  Past Surgical History:  Procedure Laterality Date   COLONOSCOPY     FACIAL COSMETIC SURGERY     over 15 years ago   FINGER TENDON REPAIR     middle rt hand-   HAMMER TOE SURGERY  12/27/2011   Procedure: HAMMER TOE CORRECTION;  Surgeon: Wylene Simmer, MD;  Location:  [facility];  Service: Orthopedics;  Laterality: Left;  2-4th    REVIEW OF SYSTEMS:  A comprehensive review of systems was negative except for: Constitutional: positive for fatigue   PHYSICAL EXAMINATION: General appearance: alert, cooperative, fatigued, and no distress Head: Normocephalic, without obvious abnormality, atraumatic Neck: no adenopathy, no JVD, supple, symmetrical, trachea midline, and thyroid not enlarged, symmetric, no tenderness/mass/nodules Lymph nodes: Cervical, supraclavicular, and axillary nodes normal. Resp: clear to auscultation bilaterally Back: symmetric, no curvature. ROM normal. No CVA tenderness. Cardio: regular rate and rhythm, S1, S2 normal, no murmur, click, rub or gallop GI: soft, non-tender; bowel sounds normal; no masses,  no organomegaly Extremities: extremities normal, atraumatic, no cyanosis or edema  ECOG PERFORMANCE STATUS: 1 - Symptomatic but completely ambulatory  Blood pressure 127/69, pulse 60, temperature 97.6 F (36.4 C), temperature source Oral, resp. rate 16, weight 126 lb 12.8 oz (57.5 kg), last menstrual period 07/23/1998, SpO2 97 %.  LABORATORY DATA: Lab Results  Component Value Date   WBC 10.8 (H) 10/17/2022   HGB 13.8 10/17/2022   HCT 42.7 10/17/2022   MCV 88.4 10/17/2022   PLT 305 10/17/2022      Chemistry      Component Value Date/Time   NA 139 08/22/2022 1122   NA 141 06/18/2022 0000   NA 141 06/18/2022 0000   K 3.9 08/22/2022 1122   CL 107 08/22/2022 1122   CO2 27 08/22/2022 1122   BUN 20 08/22/2022 1122   BUN 14 06/18/2022 0000   BUN 14 06/18/2022 0000   CREATININE 0.87 08/22/2022 1122   CREATININE 0.91 04/24/2013 0953   GLU 78 06/18/2022 0000   GLU 78 06/18/2022 0000      Component Value Date/Time   CALCIUM 9.7 08/22/2022 1122   ALKPHOS 84 08/22/2022 1122   AST 14 (L) 08/22/2022 1122   ALT 15 08/22/2022 1122   BILITOT 0.4 08/22/2022 1122   BILITOT 0.4 08/05/2018 1657       RADIOGRAPHIC  STUDIES: No results found.  ASSESSMENT AND PLAN: This is a very pleasant 77 years old white female with  iron deficiency anemia of unclear etiology.  The patient had previous gastrointestinal blood work in 2020 that was unremarkable.  She also had stool for Hemoccult that was negative recently. This could be secondary to inadequate dietary intake.  She has intolerance to the oral iron tablets. The patient was treated with iron infusion with Feraheme 510 Mg IV weekly for 2 weeks and she tolerated it fairly well with some mild improvement in her condition. Will repeat her blood work today including CBC, iron study and ferritin for  reevaluation of her condition. If she has improvement in her parameters, we will see her back for follow-up visit in 3 months for evaluation and repeat blood work but if the labs showed persistent iron deficiency, I will arrange for her to receive iron infusion in the interval. The patient was advised to call immediately if she has any other concerning symptoms in the interval. The patient voices understanding of current disease status and treatment options and is in agreement with the current care plan.  All questions were answered. The patient knows to call the clinic with any problems, questions or concerns. We can certainly see the patient much sooner if necessary.  The total time spent in the appointment was 20 minutes.  Disclaimer: This note was dictated with voice recognition software. Similar sounding words can inadvertently be transcribed and may not be corrected upon review.

## 2022-10-18 LAB — TSH: TSH: 1.445 u[IU]/mL (ref 0.350–4.500)

## 2022-10-18 LAB — FERRITIN: Ferritin: 80 ng/mL (ref 11–307)

## 2022-10-19 LAB — PROTEIN ELECTROPHORESIS, SERUM, WITH REFLEX
A/G Ratio: 1.4 (ref 0.7–1.7)
Albumin ELP: 3.3 g/dL (ref 2.9–4.4)
Alpha-1-Globulin: 0.3 g/dL (ref 0.0–0.4)
Alpha-2-Globulin: 0.7 g/dL (ref 0.4–1.0)
Beta Globulin: 0.7 g/dL (ref 0.7–1.3)
Gamma Globulin: 0.6 g/dL (ref 0.4–1.8)
Globulin, Total: 2.4 g/dL (ref 2.2–3.9)
Total Protein ELP: 5.7 g/dL — ABNORMAL LOW (ref 6.0–8.5)

## 2022-10-22 ENCOUNTER — Encounter: Payer: Self-pay | Admitting: Internal Medicine

## 2022-10-23 ENCOUNTER — Telehealth: Payer: Self-pay | Admitting: Medical Oncology

## 2022-10-23 NOTE — Telephone Encounter (Signed)
Reviewed labs with pt sister at her request . I told her to keep appt in June.

## 2022-11-12 ENCOUNTER — Non-Acute Institutional Stay: Payer: Medicare Other | Admitting: Adult Health

## 2022-11-12 ENCOUNTER — Encounter: Payer: Self-pay | Admitting: Adult Health

## 2022-11-12 VITALS — BP 118/74 | HR 60 | Temp 98.0°F | Resp 16 | Ht 62.0 in

## 2022-11-12 DIAGNOSIS — I10 Essential (primary) hypertension: Secondary | ICD-10-CM

## 2022-11-12 DIAGNOSIS — G629 Polyneuropathy, unspecified: Secondary | ICD-10-CM | POA: Diagnosis not present

## 2022-11-12 DIAGNOSIS — I69354 Hemiplegia and hemiparesis following cerebral infarction affecting left non-dominant side: Secondary | ICD-10-CM | POA: Diagnosis not present

## 2022-11-12 DIAGNOSIS — M810 Age-related osteoporosis without current pathological fracture: Secondary | ICD-10-CM | POA: Diagnosis not present

## 2022-11-12 DIAGNOSIS — F418 Other specified anxiety disorders: Secondary | ICD-10-CM

## 2022-11-12 DIAGNOSIS — E611 Iron deficiency: Secondary | ICD-10-CM

## 2022-11-12 DIAGNOSIS — H6123 Impacted cerumen, bilateral: Secondary | ICD-10-CM

## 2022-11-12 NOTE — Progress Notes (Signed)
Location:  [facility]  POS: Clinic  Provider: Fletcher Anon, ANP  Goals of Care:     08/07/2022    1:57 PM  Advanced Directives  Does Patient Have a Medical Advance Directive? Yes  Type of Estate agent of [place];Living will;Out of facility DNR (pink MOST or yellow form)  Does patient want to make changes to medical advance directive? No - Patient declined  Copy of Healthcare Power of Attorney in Chart? Yes - validated most recent copy scanned in chart (See row information)     No chief complaint on file.   HPI: Patient is a 77 y.o. female seen today for medical management of chronic diseases.    PMH CVA with left sided weakness 10/2019, also HTN, MCI, hearing loss, osteoporosis, OA, Raynauds, depression, HLD, constipation, neuropathy, anemia.   Reports having decreased vision in both eyes, going to see eye doc  Can dress independently, but has home care for bathing.  Able to transfer to the BR on her own Wears briefs in case, has occasional leakage  Denies trouble sleeping on lunesta  Taking lexapro and wellbutrin No panic attacks, has some anxiety but doing better by her account.   Constipation does not go every day, taking miralax and senokot s  Continues on protonix for issues of abd pain and gerd. Has hiatal hernia. No current symptoms reported.   Iron def anemia, followed by Dr Shirline Frees, receiving iron infusion Hgb 13.8 10/17/22  Past Medical History:  Diagnosis Date   Anxiety    Aortic atherosclerosis (HCC)    Arthritis    Complication of anesthesia    per pt, slow to wake up past sedation.   CVA (cerebral vascular accident) (HCC) 11/10/2019   Depression    Diverticulosis    Hiatal hernia    Hyperplastic colon polyp    Hypertension    Neuropathy    Osteoarthritis    Osteoporosis    Renal mass    Stomach upset    has a senstive stomach   Throat clearing    has a cough at times   Urinary incontinence    Vitamin D deficiency      Past Surgical History:  Procedure Laterality Date   COLONOSCOPY     FACIAL COSMETIC SURGERY     over 15 years ago   FINGER TENDON REPAIR     middle rt hand-   HAMMER TOE SURGERY  12/27/2011   Procedure: HAMMER TOE CORRECTION;  Surgeon: Toni Arthurs, MD;  Location: [facility];  Service: Orthopedics;  Laterality: Left;  2-4th    Allergies  Allergen Reactions   Chlorhexidine     Outpatient Encounter Medications as of 11/12/2022  Medication Sig   acetaminophen (TYLENOL) 325 MG tablet Take 650 mg by mouth at bedtime.   acetaminophen (TYLENOL) 500 MG tablet Take 1,000 mg by mouth 2 (two) times daily as needed for moderate pain or mild pain.   atorvastatin (LIPITOR) 40 MG tablet Take 1 tablet (40 mg total) by mouth daily.   buPROPion (WELLBUTRIN XL) 150 MG 24 hr tablet Take 150 mg by mouth every morning.   clopidogrel (PLAVIX) 75 MG tablet Take 1 tablet (75 mg total) by mouth daily.   cycloSPORINE (RESTASIS) 0.05 % ophthalmic emulsion Place 1 drop into both eyes 2 (two) times daily.   escitalopram (LEXAPRO) 10 MG tablet Take 10 mg by mouth daily.   eszopiclone (LUNESTA) 2 MG TABS tablet Take 1 tablet (2 mg total) by mouth  at bedtime. Take immediately before bedtime   magnesium hydroxide (MILK OF MAGNESIA) 400 MG/5ML suspension If no BM in 3 days, give 30 cc Milk of Magnesium p.o. x 1 dose in 24 hours as needed (Do not use standing constipation orders for residents with renal failure CFR less than 30. Contact MD for orders) (Physician Order)   melatonin 5 MG TABS Take 5 mg by mouth at bedtime.   metoprolol tartrate (LOPRESSOR) 25 MG tablet Take 12.5 mg by mouth 2 (two) times daily.   NON FORMULARY Diet: Regular, Chopped/Dysphagia 3   Special Instructions: Aspiration precautions. May have bacon per SLP.   Nutritional Supplements (BOOST PLUS PO) Take 1 Can by mouth daily as needed (Mid morning, Vanilla).   pantoprazole (PROTONIX) 20 MG tablet Take 20 mg by mouth daily.    senna (SENOKOT) 8.6 MG TABS tablet Take 2 tablets by mouth at bedtime as needed for mild constipation.   traZODone (DESYREL) 50 MG tablet Take 25 mg by mouth at bedtime as needed for sleep (insomnia).   No facility-administered encounter medications on file as of 11/12/2022.    Review of Systems:  Review of Systems  Constitutional:  Negative for activity change, appetite change, chills, diaphoresis, fatigue, fever and unexpected weight change.  HENT:  Negative for congestion.   Respiratory:  Negative for cough, shortness of breath and wheezing.   Cardiovascular:  Negative for chest pain, palpitations and leg swelling.  Gastrointestinal:  Negative for abdominal distention, abdominal pain, constipation and diarrhea.  Genitourinary:  Negative for difficulty urinating and dysuria.  Musculoskeletal:  Positive for gait problem. Negative for arthralgias, back pain, joint swelling and myalgias.  Neurological:  Positive for weakness and numbness. Negative for dizziness, tremors, seizures, syncope, facial asymmetry, speech difficulty, light-headedness and headaches.  Psychiatric/Behavioral:  Negative for agitation, behavioral problems and confusion. The patient is nervous/anxious.     Health Maintenance  Topic Date Due   Medicare Annual Wellness (AWV)  Never done   COVID-19 Vaccine (7 - 2023-24 season) 07/23/2022   INFLUENZA VACCINE  02/21/2023   DTaP/Tdap/Td (2 - Td or Tdap) 01/24/2031   Pneumonia Vaccine 47+ Years old  Completed   Hepatitis C Screening  Completed   Zoster Vaccines- Shingrix  Completed   HPV VACCINES  Aged Out   DEXA SCAN  Discontinued   COLONOSCOPY (Pts 45-73yrs Insurance coverage will need to be confirmed)  Discontinued    Physical Exam: There were no vitals filed for this visit. There is no height or weight on file to calculate BMI. Physical Exam Vitals and nursing note reviewed.  Constitutional:      General: She is not in acute distress.    Appearance: She is not  diaphoretic.  HENT:     Head: Normocephalic and atraumatic.     Right Ear: There is impacted cerumen.     Left Ear: There is impacted cerumen.     Nose: Nose normal.     Mouth/Throat:     Mouth: Mucous membranes are moist.     Pharynx: Oropharynx is clear.  Neck:     Vascular: No JVD.  Cardiovascular:     Rate and Rhythm: Normal rate and regular rhythm.     Heart sounds: No murmur heard. Pulmonary:     Effort: Pulmonary effort is normal. No respiratory distress.     Breath sounds: Normal breath sounds. No wheezing.  Abdominal:     General: Bowel sounds are normal. There is no distension.     Palpations:  Abdomen is soft.     Tenderness: There is no abdominal tenderness.  Musculoskeletal:     Comments: Trace BLE edema  Skin:    General: Skin is warm and dry.     Comments: Bluish discoloration to BLE No ulceration  Neurological:     Mental Status: She is alert and oriented to person, place, and time.     Comments: Left sided weakness.   Psychiatric:        Mood and Affect: Mood normal.     Labs reviewed: Basic Metabolic Panel: Recent Labs    11/28/21 0000 06/18/22 0000 08/22/22 1122 10/17/22 1602  NA 143 141  141 139 136  K 4.3 4.1  4.1 3.9 4.1  CL 108 106  106 107 104  CO2 GLUCOSE  --   --  87 111*  BUN CREATININE 0.8 0.7  0.7 0.87 0.95  CALCIUM 9.6 9.5  9.5 9.7 9.3  TSH 1.68  --   --  1.445   Liver Function Tests: Recent Labs    06/18/22 0000 08/22/22 1122 10/17/22 1602  AST 16  16 14* 20  ALT ALKPHOS 97  97 84 78  BILITOT  --  0.4 0.2*  PROT  --  6.6 6.2*  ALBUMIN 3.6  3.6 3.7 3.9   No results for input(s): "LIPASE", "AMYLASE" in the last 8760 hours. No results for input(s): "AMMONIA" in the last 8760 hours. CBC: Recent Labs    07/27/22 0000 08/22/22 1122 10/17/22 1602  WBC 10.3 11.9* 10.8*  NEUTROABS  --  9.4* 8.8*  HGB 9.5* 9.5* 13.8  HCT 30* 31.9* 42.7  MCV  --  73.5* 88.4  PLT  454* 480* 305   Lipid Panel: Recent Labs    11/28/21 0000  CHOL 151  HDL 65  LDLCALC 71  TRIG 77   Lab Results  Component Value Date   HGBA1C 5.1 11/11/2019    Procedures since last visit: No results found.  Assessment/Plan  1. Primary hypertension Controlled Continue lopressor bid  2. 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   3. Age-related osteoporosis without current pathological fracture Declined bone density scan  4. Neuropathy No current issues Foot exam done  5. Iron deficiency With anemia ?dietary Followed by Dr Shirline Frees Receiving iron infusions with improved Hgb  6. Depression with anxiety Followed by Dr Donell Beers Continue wellbutrin and lexapro  7. Bilateral impacted cerumen Begin cerumen impaction protocol   Labs/tests ordered:  * No order type specified * Lipid panel in one month  Next appt:  4 months with Dr Chales Abrahams   Total time :  time greater than 50% of total time spent doing pt counseling and coordination of care    Recommend covid booster

## 2023-01-04 ENCOUNTER — Telehealth: Payer: Self-pay | Admitting: Internal Medicine

## 2023-01-04 NOTE — Telephone Encounter (Signed)
Called patient regarding June appointments, patient is notified. 

## 2023-01-16 ENCOUNTER — Other Ambulatory Visit: Payer: Self-pay | Admitting: Orthopedic Surgery

## 2023-01-16 ENCOUNTER — Ambulatory Visit: Payer: Medicare Other | Admitting: Internal Medicine

## 2023-01-16 ENCOUNTER — Other Ambulatory Visit: Payer: Medicare Other

## 2023-01-16 DIAGNOSIS — F5101 Primary insomnia: Secondary | ICD-10-CM

## 2023-01-16 MED ORDER — ESZOPICLONE 2 MG PO TABS: 2.0000 mg | ORAL_TABLET | Freq: Every day | ORAL | 3 refills | Status: DC

## 2023-01-17 ENCOUNTER — Telehealth: Payer: Self-pay

## 2023-01-17 NOTE — Telephone Encounter (Signed)
The cost of the med per pharmacy is $15.  The staff at [facility] will discussed this with the patient and get back with [place] on their decision.

## 2023-01-17 NOTE — Telephone Encounter (Signed)
[facility] called and requested a change in medications for the patient. The pharmacy tech reports that Lunesta 2 MG is not covered by patients insurance and needs to be changed to Zolpidem. Pharmacy tech said a great call back number is [phone], ext. [phone].

## 2023-01-28 ENCOUNTER — Inpatient Hospital Stay: Payer: Medicare Other | Admitting: Internal Medicine

## 2023-01-28 ENCOUNTER — Other Ambulatory Visit: Payer: Self-pay

## 2023-01-28 ENCOUNTER — Inpatient Hospital Stay: Payer: Medicare Other | Attending: Internal Medicine

## 2023-01-28 VITALS — BP 119/70 | HR 59 | Resp 17 | Ht 62.0 in | Wt 124.6 lb

## 2023-01-28 DIAGNOSIS — D509 Iron deficiency anemia, unspecified: Secondary | ICD-10-CM | POA: Insufficient documentation

## 2023-01-28 DIAGNOSIS — D500 Iron deficiency anemia secondary to blood loss (chronic): Secondary | ICD-10-CM

## 2023-01-28 LAB — COMPREHENSIVE METABOLIC PANEL
ALT: 21 U/L (ref 0–44)
AST: 17 U/L (ref 15–41)
Albumin: 3.6 g/dL (ref 3.5–5.0)
Alkaline Phosphatase: 79 U/L (ref 38–126)
Anion gap: 9 (ref 5–15)
BUN: 18 mg/dL (ref 8–23)
CO2: 22 mmol/L (ref 22–32)
Calcium: 9.6 mg/dL (ref 8.9–10.3)
Chloride: 106 mmol/L (ref 98–111)
Creatinine, Ser: 0.89 mg/dL (ref 0.44–1.00)
GFR, Estimated: >60 mL/min (ref >60)
Glucose, Bld: 112 mg/dL — ABNORMAL HIGH (ref 70–99)
Potassium: 3.6 mmol/L (ref 3.5–5.1)
Sodium: 137 mmol/L (ref 135–145)
Total Bilirubin: 0.5 mg/dL (ref 0.3–1.2)
Total Protein: 6.3 g/dL — ABNORMAL LOW (ref 6.5–8.1)

## 2023-01-28 LAB — CBC WITH DIFFERENTIAL (CANCER CENTER ONLY)
Abs Immature Granulocytes: 0.18 10*3/uL — ABNORMAL HIGH (ref 0.00–0.07)
Basophils Absolute: 0.1 10*3/uL (ref 0.0–0.1)
Basophils Relative: 1 %
Eosinophils Absolute: 0.1 10*3/uL (ref 0.0–0.5)
Eosinophils Relative: 1 %
HCT: 41.7 % (ref 36.0–46.0)
Hemoglobin: 13.7 g/dL (ref 12.0–15.0)
Immature Granulocytes: 2 %
Lymphocytes Relative: 8 %
Lymphs Abs: 1 10*3/uL (ref 0.7–4.0)
MCH: 31.7 pg (ref 26.0–34.0)
MCHC: 32.9 g/dL (ref 30.0–36.0)
MCV: 96.5 fL (ref 80.0–100.0)
Monocytes Absolute: 0.8 10*3/uL (ref 0.1–1.0)
Monocytes Relative: 6 %
Neutro Abs: 10.3 10*3/uL — ABNORMAL HIGH (ref 1.7–7.7)
Neutrophils Relative %: 82 %
Platelet Count: 326 10*3/uL (ref 150–400)
RBC: 4.32 MIL/uL (ref 3.87–5.11)
RDW: 14.6 % (ref 11.5–15.5)
WBC Count: 12.4 10*3/uL — ABNORMAL HIGH (ref 4.0–10.5)
nRBC: 0 % (ref 0.0–0.2)

## 2023-01-28 LAB — IRON AND IRON BINDING CAPACITY (CC-WL,HP ONLY)
Iron: 49 ug/dL (ref 28–170)
Saturation Ratios: 18 % (ref 10.4–31.8)
TIBC: 270 ug/dL (ref 250–450)
UIBC: 221 ug/dL (ref 148–442)

## 2023-01-28 LAB — FERRITIN: Ferritin: 52 ng/mL (ref 11–307)

## 2023-01-28 NOTE — Progress Notes (Signed)
[facility] Telephone:(336) [phone]   Fax:(336) [phone]  OFFICE PROGRESS NOTE  [name], MD [address] [place] [address]  DIAGNOSIS: Iron deficiency anemia of unclear etiology.  The patient had previous gastrointestinal blood work in 2020 that was unremarkable.  She also had stool for Hemoccult that was negative recently. This could be secondary to inadequate dietary intake.  She has intolerance to the oral iron tablets.  PRIOR THERAPY: Feraheme infusion 510 Mg IV weekly for 2 weeks  CURRENT THERAPY: Observation  INTERVAL HISTORY: Lynn [name] 77 y.o. female returns to the clinic today accompanied by her sister.  The patient is feeling fine today with no concerning complaints except for some cold symptoms with mild cough.  She denied having any chest pain, shortness of breath or hemoptysis.  She has no nausea, vomiting, diarrhea or constipation.  She has no headache or visual changes.  She is here today for evaluation and repeat blood work for evaluation of her anemia.   MEDICAL HISTORY: Past Medical History:  Diagnosis Date   Anxiety    Aortic atherosclerosis (HCC)    Arthritis    Complication of anesthesia    per pt, slow to wake up past sedation.   CVA (cerebral vascular accident) (HCC) 11/10/2019   Depression    Diverticulosis    Hiatal hernia    Hyperplastic colon polyp    Hypertension    Neuropathy    Osteoarthritis    Osteoporosis    Renal mass    Stomach upset    has a senstive stomach   Throat clearing    has a cough at times   Urinary incontinence    Vitamin D deficiency     ALLERGIES:  is allergic to chlorhexidine.  MEDICATIONS:  Current Outpatient Medications  Medication Sig Dispense Refill   acetaminophen (TYLENOL) 325 MG tablet Take 650 mg by mouth at bedtime.     acetaminophen (TYLENOL) 500 MG tablet Take 1,000 mg by mouth 2 (two) times daily as needed for moderate pain or mild pain.     atorvastatin  (LIPITOR) 40 MG tablet Take 1 tablet (40 mg total) by mouth daily. 30 tablet 0   buPROPion (WELLBUTRIN XL) 150 MG 24 hr tablet Take 150 mg by mouth every morning.     clopidogrel (PLAVIX) 75 MG tablet Take 1 tablet (75 mg total) by mouth daily. 30 tablet 0   cycloSPORINE (RESTASIS) 0.05 % ophthalmic emulsion Place 1 drop into both eyes 2 (two) times daily.     escitalopram (LEXAPRO) 10 MG tablet Take 10 mg by mouth daily.     eszopiclone (LUNESTA) 2 MG TABS tablet Take 1 tablet (2 mg total) by mouth at bedtime. Take immediately before bedtime 30 tablet 3   magnesium hydroxide (MILK OF MAGNESIA) 400 MG/5ML suspension If no BM in 3 days, give 30 cc Milk of Magnesium p.o. x 1 dose in 24 hours as needed (Do not use standing constipation orders for residents with renal failure CFR less than 30. Contact MD for orders) (Physician Order)     metoprolol tartrate (LOPRESSOR) 25 MG tablet Take 12.5 mg by mouth 2 (two) times daily.     NON FORMULARY Diet: Regular, Chopped/Dysphagia 3   Special Instructions: Aspiration precautions. May have bacon per SLP.     Nutritional Supplements (BOOST PLUS PO) Take 1 Can by mouth daily as needed (Mid morning, Vanilla).     pantoprazole (PROTONIX) 20 MG tablet  Take 20 mg by mouth daily.     polyethylene glycol (MIRALAX / GLYCOLAX) 17 g packet Take 17 g by mouth daily.     senna (SENOKOT) 8.6 MG TABS tablet Take 2 tablets by mouth at bedtime as needed for mild constipation.     No current facility-administered medications for this visit.    SURGICAL HISTORY:  Past Surgical History:  Procedure Laterality Date   COLONOSCOPY     FACIAL COSMETIC SURGERY     over 15 years ago   FINGER TENDON REPAIR     middle rt hand-   HAMMER TOE SURGERY  12/27/2011   Procedure: HAMMER TOE CORRECTION;  Surgeon: Toni Arthurs, MD;  Location: [facility];  Service: Orthopedics;  Laterality: Left;  2-4th    REVIEW OF SYSTEMS:  A comprehensive review of systems was negative  except for: Ears, nose, mouth, throat, and face: positive for nasal congestion   PHYSICAL EXAMINATION: General appearance: alert, cooperative, and no distress Head: Normocephalic, without obvious abnormality, atraumatic Neck: no adenopathy, no JVD, supple, symmetrical, trachea midline, and thyroid not enlarged, symmetric, no tenderness/mass/nodules Lymph nodes: Cervical, supraclavicular, and axillary nodes normal. Resp: clear to auscultation bilaterally Back: symmetric, no curvature. ROM normal. No CVA tenderness. Cardio: regular rate and rhythm, S1, S2 normal, no murmur, click, rub or gallop GI: soft, non-tender; bowel sounds normal; no masses,  no organomegaly Extremities: extremities normal, atraumatic, no cyanosis or edema  ECOG PERFORMANCE STATUS: 1 - Symptomatic but completely ambulatory  Blood pressure 119/70, pulse (!) 59, resp. rate 17, height 5\' 2"  (1.575 m), weight 124 lb 9.6 oz (56.5 kg), last menstrual period 07/23/1998, SpO2 97 %.  LABORATORY DATA: Lab Results  Component Value Date   WBC 12.4 (H) 01/28/2023   HGB 13.7 01/28/2023   HCT 41.7 01/28/2023   MCV 96.5 01/28/2023   PLT 326 01/28/2023      Chemistry      Component Value Date/Time   NA 136 10/17/2022 1602   NA 141 06/18/2022 0000   NA 141 06/18/2022 0000   K 4.1 10/17/2022 1602   CL 104 10/17/2022 1602   CO2 23 10/17/2022 1602   BUN 20 10/17/2022 1602   BUN 14 06/18/2022 0000   BUN 14 06/18/2022 0000   CREATININE 0.95 10/17/2022 1602   CREATININE 0.91 04/24/2013 0953   GLU 78 06/18/2022 0000   GLU 78 06/18/2022 0000      Component Value Date/Time   CALCIUM 9.3 10/17/2022 1602   ALKPHOS 78 10/17/2022 1602   AST 20 10/17/2022 1602   ALT 23 10/17/2022 1602   BILITOT 0.2 (L) 10/17/2022 1602       RADIOGRAPHIC STUDIES: No results found.  ASSESSMENT AND PLAN: This is a very pleasant 77 years old white female with  iron deficiency anemia of unclear etiology.  The patient had previous  gastrointestinal blood work in 2020 that was unremarkable.  She also had stool for Hemoccult that was negative recently. This could be secondary to inadequate dietary intake.  She has intolerance to the oral iron tablets. The patient was treated with iron infusion with Feraheme 510 Mg IV weekly for 2 weeks and she tolerated it fairly well  The patient has been in observation since that time and she is feeling fine with no concerning complaints. Repeat CBC today showed normal hemoglobin of 13.7 and hematocrit 41.7%.  She had slightly elevated white blood count probably secondary to her nasal congestion and cold symptoms.  Iron study and  ferritin are still pending.  I recommended for for the patient to continue on observation and close monitoring by her primary care provider.  I will be happy to see her in as needed basis in the future if she has significant decline in her hemoglobin, hematocrit or iron study. The patient was advised to call if she has any concerning symptoms in the interval. The patient voices understanding of current disease status and treatment options and is in agreement with the current care plan.  All questions were answered. The patient knows to call the clinic with any problems, questions or concerns. We can certainly see the patient much sooner if necessary.  The total time spent in the appointment was 20 minutes.  Disclaimer: This note was dictated with voice recognition software. Similar sounding words can inadvertently be transcribed and may not be corrected upon review.

## 2023-02-28 ENCOUNTER — Encounter: Payer: Self-pay | Admitting: Neurology

## 2023-03-12 ENCOUNTER — Encounter: Payer: Self-pay | Admitting: Internal Medicine

## 2023-03-12 ENCOUNTER — Non-Acute Institutional Stay: Payer: Medicare Other | Admitting: Internal Medicine

## 2023-03-12 VITALS — BP 122/76 | HR 60 | Temp 98.0°F | Resp 16 | Ht 62.0 in | Wt 122.0 lb

## 2023-03-12 DIAGNOSIS — I69354 Hemiplegia and hemiparesis following cerebral infarction affecting left non-dominant side: Secondary | ICD-10-CM | POA: Diagnosis not present

## 2023-03-12 DIAGNOSIS — F5101 Primary insomnia: Secondary | ICD-10-CM

## 2023-03-12 DIAGNOSIS — I10 Essential (primary) hypertension: Secondary | ICD-10-CM | POA: Diagnosis not present

## 2023-03-12 DIAGNOSIS — E7800 Pure hypercholesterolemia, unspecified: Secondary | ICD-10-CM

## 2023-03-12 DIAGNOSIS — F418 Other specified anxiety disorders: Secondary | ICD-10-CM

## 2023-03-12 DIAGNOSIS — M810 Age-related osteoporosis without current pathological fracture: Secondary | ICD-10-CM | POA: Diagnosis not present

## 2023-03-12 DIAGNOSIS — E611 Iron deficiency: Secondary | ICD-10-CM

## 2023-03-12 NOTE — Progress Notes (Unsigned)
Location:  [facility] features editor of Service:  Clinic (12)  Provider:   Code Status:  Goals of Care:     11/12/2022    2:58 PM  Advanced Directives  Does Patient Have a Medical Advance Directive? Yes  Type of Advance Directive Out of facility DNR (pink MOST or yellow form);Healthcare Power of [place];Living will  Does patient want to make changes to medical advance directive? No - Patient declined  Copy of Healthcare Power of Attorney in Chart? Yes - validated most recent copy scanned in chart (See row information)     Chief Complaint  Patient presents with  . Medical Management of Chronic Issues    Patient is being seen for 4 month follow up  . Immunizations    Patient is due for covid and flu vaccine     HPI: Patient is a 77 y.o. female seen today for medical management of chronic diseases.   Lives in [place] in [place]   Patient has a history of hypertension, hyperlipidemia, depression and anxiety, osteoporosis   bilateral CVAs left greater than right in 2021 Followed By Dysphagia , Dysarthria,   Iron Def Anemia Which I sresolved with Iron infusions Her mood is much better also Need some help with dressings but otherwise stays   Past Medical History:  Diagnosis Date  . Anxiety   . Aortic atherosclerosis (HCC)   . Arthritis   . Complication of anesthesia    per pt, slow to wake up past sedation.  . CVA (cerebral vascular accident) (HCC) 11/10/2019  . Depression   . Diverticulosis   . Hiatal hernia   . Hyperplastic colon polyp   . Hypertension   . Neuropathy   . Osteoarthritis   . Osteoporosis   . Renal mass   . Stomach upset    has a senstive stomach  . Throat clearing    has a cough at times  . Urinary incontinence   . Vitamin D deficiency     Past Surgical History:  Procedure Laterality Date  . COLONOSCOPY    . FACIAL COSMETIC SURGERY     over 15 years ago  . FINGER TENDON REPAIR     middle rt hand-  . HAMMER TOE SURGERY   12/27/2011   Procedure: HAMMER TOE CORRECTION;  Surgeon: Toni Arthurs, MD;  Location: [facility];  Service: Orthopedics;  Laterality: Left;  2-4th    Allergies  Allergen Reactions  . Chlorhexidine     Outpatient Encounter Medications as of 03/12/2023  Medication Sig  . acetaminophen (TYLENOL) 325 MG tablet Take 650 mg by mouth at bedtime.  Marland Kitchen atorvastatin (LIPITOR) 40 MG tablet Take 1 tablet (40 mg total) by mouth daily.  Marland Kitchen buPROPion (WELLBUTRIN XL) 150 MG 24 hr tablet Take 150 mg by mouth every morning.  . clopidogrel (PLAVIX) 75 MG tablet Take 1 tablet (75 mg total) by mouth daily.  . cycloSPORINE (RESTASIS) 0.05 % ophthalmic emulsion Place 1 drop into both eyes 2 (two) times daily.  Marland Kitchen escitalopram (LEXAPRO) 10 MG tablet Take 10 mg by mouth daily.  . eszopiclone (LUNESTA) 2 MG TABS tablet Take 1 tablet (2 mg total) by mouth at bedtime. Take immediately before bedtime  . metoprolol tartrate (LOPRESSOR) 25 MG tablet Take 12.5 mg by mouth 2 (two) times daily.  . NON FORMULARY Diet: Regular, Chopped/Dysphagia 3   Special Instructions: Aspiration precautions. May have bacon per SLP.  Marland Kitchen pantoprazole (PROTONIX) 20 MG tablet Take 20 mg  by mouth daily.  . polyethylene glycol (MIRALAX / GLYCOLAX) 17 g packet Take 17 g by mouth daily.  Marland Kitchen senna (SENOKOT) 8.6 MG TABS tablet Take 2 tablets by mouth at bedtime as needed for mild constipation.  Marland Kitchen acetaminophen (TYLENOL) 500 MG tablet Take 1,000 mg by mouth 2 (two) times daily as needed for moderate pain or mild pain. (Patient not taking: Reported on 03/12/2023)  . magnesium hydroxide (MILK OF MAGNESIA) 400 MG/5ML suspension If no BM in 3 days, give 30 cc Milk of Magnesium p.o. x 1 dose in 24 hours as needed (Do not use standing constipation orders for residents with renal failure CFR less than 30. Contact MD for orders) (Physician Order) (Patient not taking: Reported on 03/12/2023)  . Nutritional Supplements (BOOST PLUS PO) Take 1 Can by mouth  daily as needed (Mid morning, Vanilla). (Patient not taking: Reported on 03/12/2023)   No facility-administered encounter medications on file as of 03/12/2023.    Review of Systems:  Review of Systems  Health Maintenance  Topic Date Due  . Medicare Annual Wellness (AWV)  Never done  . COVID-19 Vaccine (7 - 2023-24 season) 07/23/2022  . INFLUENZA VACCINE  02/21/2023  . DTaP/Tdap/Td (2 - Td or Tdap) 01/24/2031  . Pneumonia Vaccine 33+ Years old  Completed  . Hepatitis C Screening  Completed  . Zoster Vaccines- Shingrix  Completed  . HPV VACCINES  Aged Out  . DEXA SCAN  Discontinued  . Colonoscopy  Discontinued    Physical Exam: Vitals:   03/12/23 1309  BP: 122/76  Pulse: 60  Resp: 16  Temp: 98 F (36.7 C)  TempSrc: Temporal  SpO2: 97%  Weight: 122 lb (55.3 kg)  Height: 5\' 2"  (1.575 m)   Body mass index is 22.31 kg/m. Physical Exam Vitals reviewed.  Constitutional:      Appearance: Normal appearance.  HENT:     Head: Normocephalic.     Right Ear: Tympanic membrane normal.     Left Ear: Tympanic membrane normal.     Ears:     Comments: Some wax BIlateral    Nose: Nose normal.     Mouth/Throat:     Mouth: Mucous membranes are moist.     Pharynx: Oropharynx is clear.  Eyes:     Pupils: Pupils are equal, round, and reactive to light.  Cardiovascular:     Rate and Rhythm: Normal rate and regular rhythm.     Pulses: Normal pulses.     Heart sounds: Normal heart sounds. No murmur heard. Pulmonary:     Effort: Pulmonary effort is normal.     Breath sounds: Normal breath sounds.  Abdominal:     General: Abdomen is flat. Bowel sounds are normal.     Palpations: Abdomen is soft.  Musculoskeletal:        General: No swelling.     Cervical back: Neck supple.  Skin:    General: Skin is warm.  Neurological:     General: No focal deficit present.     Mental Status: She is alert and oriented to person, place, and time.  Psychiatric:        Mood and Affect: Mood  normal.        Thought Content: Thought content normal.    Labs reviewed: Basic Metabolic Panel: Recent Labs    08/22/22 1122 10/17/22 1602 01/28/23 1452  NA 139 136 137  K 3.9 4.1 3.6  CL 107 104 106  CO2 27 23 22   GLUCOSE 87  111* 112*  BUN 20 20 18   CREATININE 0.87 0.95 0.89  CALCIUM 9.7 9.3 9.6  TSH  --  1.445  --    Liver Function Tests: Recent Labs    08/22/22 1122 10/17/22 1602 01/28/23 1452  AST 14* 20 17  ALT 15 23 21   ALKPHOS 84 78 79  BILITOT 0.4 0.2* 0.5  PROT 6.6 6.2* 6.3*  ALBUMIN 3.7 3.9 3.6   No results for input(s): "LIPASE", "AMYLASE" in the last 8760 hours. No results for input(s): "AMMONIA" in the last 8760 hours. CBC: Recent Labs    08/22/22 1122 10/17/22 1602 01/28/23 1452  WBC 11.9* 10.8* 12.4*  NEUTROABS 9.4* 8.8* 10.3*  HGB 9.5* 13.8 13.7  HCT 31.9* 42.7 41.7  MCV 73.5* 88.4 96.5  PLT 480* 305 326   Lipid Panel: No results for input(s): "CHOL", "HDL", "LDLCALC", "TRIG", "CHOLHDL", "LDLDIRECT" in the last 8760 hours. Lab Results  Component Value Date   HGBA1C 5.1 11/11/2019    Procedures since last visit: No results found.  Assessment/Plan There are no diagnoses linked to this encounter.   Labs/tests ordered:  * No order type specified * Next appt:  Visit date not found

## 2023-03-18 ENCOUNTER — Ambulatory Visit: Payer: BLUE CROSS/BLUE SHIELD | Admitting: Neurology

## 2023-03-20 ENCOUNTER — Non-Acute Institutional Stay: Payer: Medicare Other | Admitting: Orthopedic Surgery

## 2023-03-20 ENCOUNTER — Encounter: Payer: Self-pay | Admitting: Orthopedic Surgery

## 2023-03-20 DIAGNOSIS — M25511 Pain in right shoulder: Secondary | ICD-10-CM | POA: Diagnosis not present

## 2023-03-20 NOTE — Progress Notes (Unsigned)
[facility] Room Number: [phone] Place of Service:  ALF [id number]) Provider:  Hazle Nordmann ,NP  [name], MD  Patient Care Team: [name], MD as PCP - General (Internal Medicine) Toni Arthurs, MD as Consulting Physician (Orthopedic Surgery) Van Clines, MD as Consulting Physician (Neurology)  Extended Emergency Contact Information Primary Emergency Contact: [facility] Address: [address]          [place], [place] [address] Darden Amber of [place] Mobile Phone: [phone] Relation: Sister Interpreter needed? No Secondary Emergency Contact: Able,Jim  [place] Phone: [phone] Relation: Brother  Code Status:  DNR Goals of care: Advanced Directive information    03/20/2023    3:36 PM  Advanced Directives  Does Patient Have a Medical Advance Directive? Yes  Type of Estate agent of [place];Out of facility DNR (pink MOST or yellow form)  Does patient want to make changes to medical advance directive? No - Patient declined  Copy of Healthcare Power of Attorney in Chart? Yes - validated most recent copy scanned in chart (See row information)  Pre-existing out of facility DNR order (yellow form or pink MOST form) Pink MOST form placed in chart (order not valid for inpatient use);Yellow form placed in chart (order not valid for inpatient use)     Chief Complaint  Patient presents with   Acute Visit    Patient being seen for acute shoulder pain   Immunizations    Patient needs updated Flu vaccine.    HPI:  Pt is a 76 y.o. female seen today for an acute visit for    Past Medical History:  Diagnosis Date   Anxiety    Aortic atherosclerosis (HCC)    Arthritis    Complication of anesthesia    per pt, slow to wake up past sedation.   CVA (cerebral vascular accident) (HCC) 11/10/2019   Depression    Diverticulosis    Hiatal hernia    Hyperplastic colon polyp     Hypertension    Neuropathy    Osteoarthritis    Osteoporosis    Renal mass    Stomach upset    has a senstive stomach   Throat clearing    has a cough at times   Urinary incontinence    Vitamin D deficiency    Past Surgical History:  Procedure Laterality Date   COLONOSCOPY     FACIAL COSMETIC SURGERY     over 15 years ago   FINGER TENDON REPAIR     middle rt hand-   HAMMER TOE SURGERY  12/27/2011   Procedure: HAMMER TOE CORRECTION;  Surgeon: Toni Arthurs, MD;  Location: [facility];  Service: Orthopedics;  Laterality: Left;  2-4th    Allergies  Allergen Reactions   Chlorhexidine     Outpatient Encounter Medications as of 03/20/2023  Medication Sig   acetaminophen (TYLENOL) 325 MG tablet Take 650 mg by mouth at bedtime.   atorvastatin (LIPITOR) 40 MG tablet Take 1 tablet (40 mg total) by mouth daily.   buPROPion (WELLBUTRIN XL) 150 MG 24 hr tablet Take 150 mg by mouth every morning.   clopidogrel (PLAVIX) 75 MG tablet Take 1 tablet (75 mg total) by mouth daily.   cycloSPORINE (RESTASIS) 0.05 % ophthalmic emulsion Place 1 drop into both eyes 2 (two) times daily.   escitalopram (LEXAPRO) 10 MG tablet Take 10 mg by mouth daily.   eszopiclone (LUNESTA) 2 MG TABS tablet Take 1  tablet (2 mg total) by mouth at bedtime. Take immediately before bedtime   metoprolol tartrate (LOPRESSOR) 25 MG tablet Take 12.5 mg by mouth 2 (two) times daily.   NON FORMULARY Diet: Regular, Chopped/Dysphagia 3   Special Instructions: Aspiration precautions. May have bacon per SLP.   pantoprazole (PROTONIX) 20 MG tablet Take 20 mg by mouth daily.   polyethylene glycol (MIRALAX / GLYCOLAX) 17 g packet Take 17 g by mouth 3 (three) times a week. Mon, wed, fri   senna (SENOKOT) 8.6 MG TABS tablet Take 2 tablets by mouth at bedtime as needed for mild constipation.   acetaminophen (TYLENOL) 500 MG tablet Take 1,000 mg by mouth 2 (two) times daily as needed for moderate pain or mild pain. (Patient  not taking: Reported on 03/20/2023)   No facility-administered encounter medications on file as of 03/20/2023.    Review of Systems  Immunization History  Administered Date(s) Administered   Fluad Quad(high Dose 65+) 04/27/2019, 04/27/2022   Influenza, High Dose Seasonal PF 05/03/2017, 05/19/2018   Influenza,inj,Quad PF,6+ Mos 04/29/2013, 05/18/2014, 05/02/2015, 04/17/2016   Influenza-Unspecified 05/13/2020, 04/26/2021   Moderna Covid-19 Vaccine Bivalent Booster 65yrs & up 11/15/2022   Moderna SARS-COV2 Booster Vaccination 12/25/2020   Moderna Sars-Covid-2 Vaccination 05/28/2022   PFIZER(Purple Top)SARS-COV-2 Vaccination 08/27/2019, 09/21/2019, 06/13/2020   Pfizer Covid-19 Vaccine Bivalent Booster 5y-11y 05/03/2021   Pneumococcal Conjugate-13 05/11/2013   Pneumococcal Polysaccharide-23 05/02/2015   Tdap 01/23/2021   Zoster Recombinant(Shingrix) 01/20/2021, 03/24/2021   Zoster, Unspecified 01/20/2021   Pertinent  Health Maintenance Due  Topic Date Due   INFLUENZA VACCINE  02/21/2023   DEXA SCAN  Discontinued   Colonoscopy  Discontinued      08/28/2021    2:34 PM 11/29/2021    9:48 AM 12/15/2021    9:44 AM 08/01/2022   11:33 AM 11/12/2022    2:58 PM  Fall Risk  Falls in the past year? 0 0 1 0 0  Was there an injury with Fall? 0 0  0 0  Fall Risk Category Calculator 0 0  0 0  Fall Risk Category (Retired) Low Low  Low   (RETIRED) Patient Fall Risk Level Low fall risk Low fall risk Moderate fall risk High fall risk   Patient at Risk for Falls Due to No Fall Risks Impaired balance/gait No Fall Risks  History of fall(s);Impaired balance/gait;Impaired mobility  Fall risk Follow up Falls evaluation completed Falls evaluation completed Falls evaluation completed Falls evaluation completed Falls evaluation completed   Functional Status Survey:    Vitals:   03/20/23 1523  BP: 139/82  Pulse: (!) 58  Resp: 18  Temp: (!) 96.6 F (35.9 C)  SpO2: 94%  Weight: 122 lb (55.3 kg)   Body  mass index is 22.31 kg/m. Physical Exam  Labs reviewed: Recent Labs    08/22/22 1122 10/17/22 1602 01/28/23 1452  NA 139 136 137  K 3.9 4.1 3.6  CL 107 104 106  CO2 27 23 22   GLUCOSE 87 111* 112*  BUN 20 20 18   CREATININE 0.87 0.95 0.89  CALCIUM 9.7 9.3 9.6   Recent Labs    08/22/22 1122 10/17/22 1602 01/28/23 1452  AST 14* 20 17  ALT 15 23 21   ALKPHOS 84 78 79  BILITOT 0.4 0.2* 0.5  PROT 6.6 6.2* 6.3*  ALBUMIN 3.7 3.9 3.6   Recent Labs    08/22/22 1122 10/17/22 1602 01/28/23 1452  WBC 11.9* 10.8* 12.4*  NEUTROABS 9.4* 8.8* 10.3*  HGB 9.5* 13.8 13.7  HCT 31.9* 42.7 41.7  MCV 73.5* 88.4 96.5  PLT 480* 305 326   Lab Results  Component Value Date   TSH 1.445 10/17/2022   Lab Results  Component Value Date   HGBA1C 5.1 11/11/2019   Lab Results  Component Value Date   CHOL 151 11/28/2021   HDL 65 11/28/2021   LDLCALC 71 11/28/2021   LDLDIRECT 116.0 04/27/2019   TRIG 77 11/28/2021   CHOLHDL 3.0 11/11/2019    Significant Diagnostic Results in last 30 days:  No results found.  Assessment/Plan There are no diagnoses linked to this encounter.   Family/ staff Communication: ***  Labs/tests ordered:  ***

## 2023-03-21 MED ORDER — PREDNISONE 20 MG PO TABS
20.0000 mg | ORAL_TABLET | Freq: Every day | ORAL | Status: AC
Start: 2023-03-21 — End: 2023-03-28

## 2023-03-21 MED ORDER — ACETAMINOPHEN 500 MG PO TABS
1000.0000 mg | ORAL_TABLET | Freq: Two times a day (BID) | ORAL | Status: DC
Start: 2023-03-21 — End: 2023-03-28

## 2023-03-28 ENCOUNTER — Telehealth: Payer: Self-pay | Admitting: Adult Health

## 2023-03-28 DIAGNOSIS — M25511 Pain in right shoulder: Secondary | ICD-10-CM

## 2023-03-28 MED ORDER — ACETAMINOPHEN 500 MG PO TABS
1000.0000 mg | ORAL_TABLET | Freq: Two times a day (BID) | ORAL | Status: AC
Start: 2023-03-28 — End: 2023-04-27

## 2023-03-28 MED ORDER — DICLOFENAC SODIUM 1 % EX GEL: 2.0000 g | Freq: Four times a day (QID) | CUTANEOUS | Status: DC

## 2023-03-28 NOTE — Telephone Encounter (Signed)
Staff requesting order for voltaren gel and tylenol for right shoulder pain. Pt is working with therapy and has received prednisone, order given.

## 2023-05-23 ENCOUNTER — Other Ambulatory Visit: Payer: Self-pay | Admitting: Adult Health

## 2023-05-23 DIAGNOSIS — F5101 Primary insomnia: Secondary | ICD-10-CM

## 2023-05-23 MED ORDER — ESZOPICLONE 2 MG PO TABS: 2.0000 mg | ORAL_TABLET | Freq: Every day | ORAL | 3 refills | Status: DC

## 2023-05-29 ENCOUNTER — Non-Acute Institutional Stay: Payer: Medicare Other | Admitting: Internal Medicine

## 2023-05-29 ENCOUNTER — Encounter: Payer: Self-pay | Admitting: Internal Medicine

## 2023-05-29 DIAGNOSIS — M25511 Pain in right shoulder: Secondary | ICD-10-CM

## 2023-05-29 DIAGNOSIS — G8929 Other chronic pain: Secondary | ICD-10-CM

## 2023-05-29 MED ORDER — TRAMADOL HCL 50 MG PO TABS: 25.0000 mg | ORAL_TABLET | Freq: Two times a day (BID) | ORAL | 0 refills | Status: AC

## 2023-05-29 NOTE — Progress Notes (Unsigned)
Location[facility]  [facility] Room Number: [phone] Place of Service:  ALF [id number]) Provider:  [name], MD   Lynn Gammon, MD  Patient Care Team: Lynn Gammon, MD as PCP - General (Internal Medicine) Lynn Arthurs, MD as Consulting Physician (Orthopedic Surgery) Lynn Clines, MD as Consulting Physician (Neurology)  Extended Emergency Contact Information Primary Emergency Contact: Lynn [name] Address: [address]          [place], [place] [address] Darden Amber of [place] Mobile Phone: [phone] Relation: Sister Interpreter needed? No Secondary Emergency Contact: [name],Lynn  [place] Phone: [phone] Relation: Brother  Code Status:  DNR Goals of care: Advanced Directive information    05/29/2023    2:56 PM  Advanced Directives  Does Patient Have a Medical Advance Directive? Yes  Type of Advance Directive Out of facility DNR (pink MOST or yellow form);Healthcare Power of Attorney  Does patient want to make changes to medical advance directive? No - Patient declined  Copy of Healthcare Power of Attorney in Chart? Yes - validated most recent copy scanned in chart (See row information)     Chief Complaint  Patient presents with   Acute Visit    Patient is being seen for right shoulder pain    Quality Metric Gaps    Patient is due for AWV    HPI:  Pt is a 77 y.o. female seen today for an acute visit for Right Shoulder pain  Lives in [place] [facility]   Patient has a history of hypertension, hyperlipidemia, depression and anxiety, osteoporosis   bilateral CVAs left greater than right in 2021 Followed By Dysphagia , Dysarthria,   Acute Right Shoulder pain on 03/20/23 Was seen by Amy no falls out any injuries.  Was thought to be due to arthritis.  She was started on prednisone for 7 days Patient not sure if that helped She was also seen by therapy after that to help her do her ADLs. Was seen by Dr.  Drucie Ip in [place].  X-ray showed degenerative changes without any acute fracture Diagnosed with shoulder impingement and recommended to see Dr. Rennis Chris The patient wants something for pain. Gets very Anxious   Past Medical History:  Diagnosis Date   Anxiety    Aortic atherosclerosis (HCC)    Arthritis    Complication of anesthesia    per pt, slow to wake up past sedation.   CVA (cerebral vascular accident) (HCC) 11/10/2019   Depression    Diverticulosis    Hiatal hernia    Hyperplastic colon polyp    Hypertension    Neuropathy    Osteoarthritis    Osteoporosis    Renal mass    Stomach upset    has a senstive stomach   Throat clearing    has a cough at times   Urinary incontinence    Vitamin D deficiency    Past Surgical History:  Procedure Laterality Date   COLONOSCOPY     FACIAL COSMETIC SURGERY     over 15 years ago   FINGER TENDON REPAIR     middle rt hand-   HAMMER TOE SURGERY  12/27/2011   Procedure: HAMMER TOE CORRECTION;  Surgeon: Lynn Arthurs, MD;  Location: [name];  Service: Orthopedics;  Laterality: Left;  2-4th    Allergies  Allergen Reactions   Chlorhexidine     Outpatient Encounter Medications as of 05/29/2023  Medication Sig   acetaminophen (TYLENOL) 500 MG tablet Take  1,000 mg by mouth in the morning and at bedtime.   atorvastatin (LIPITOR) 40 MG tablet Take 1 tablet (40 mg total) by mouth daily.   buPROPion (WELLBUTRIN XL) 150 MG 24 hr tablet Take 150 mg by mouth every morning.   clopidogrel (PLAVIX) 75 MG tablet Take 1 tablet (75 mg total) by mouth daily.   cycloSPORINE (RESTASIS) 0.05 % ophthalmic emulsion Place 1 drop into both eyes 2 (two) times daily.   diclofenac Sodium (VOLTAREN) 1 % GEL Apply 2 g topically 4 (four) times daily.   escitalopram (LEXAPRO) 10 MG tablet Take 10 mg by mouth daily.   eszopiclone (LUNESTA) 2 MG TABS tablet Take 1 tablet (2 mg total) by mouth at bedtime. Take immediately before bedtime    metoprolol tartrate (LOPRESSOR) 25 MG tablet Take 12.5 mg by mouth 2 (two) times daily.   NON FORMULARY Diet: Regular, Chopped/Dysphagia 3   Special Instructions: Aspiration precautions. May have bacon per SLP.   pantoprazole (PROTONIX) 20 MG tablet Take 20 mg by mouth daily.   polyethylene glycol (MIRALAX / GLYCOLAX) 17 g packet Take 17 g by mouth 3 (three) times a week. Mon, wed, fri   senna (SENOKOT) 8.6 MG TABS tablet Take 2 tablets by mouth at bedtime as needed for mild constipation.   No facility-administered encounter medications on file as of 05/29/2023.    Review of Systems  Constitutional:  Positive for activity change. Negative for appetite change.  HENT: Negative.    Respiratory:  Negative for cough and shortness of breath.   Cardiovascular:  Negative for leg swelling.  Gastrointestinal:  Negative for constipation.  Genitourinary: Negative.   Musculoskeletal:  Positive for arthralgias, gait problem and myalgias.  Skin: Negative.   Neurological:  Negative for dizziness and weakness.  Psychiatric/Behavioral:  Positive for dysphoric mood. Negative for confusion and sleep disturbance. The patient is nervous/anxious.     Immunization History  Administered Date(s) Administered   Fluad Quad(high Dose 65+) 04/27/2019, 04/27/2022, 05/14/2023   Influenza, High Dose Seasonal PF 05/03/2017, 05/19/2018   Influenza,inj,Quad PF,6+ Mos 04/29/2013, 05/18/2014, 05/02/2015, 04/17/2016   Influenza-Unspecified 05/13/2020, 04/26/2021   Moderna Covid-19 Vaccine Bivalent Booster 52yrs & up 11/15/2022, 05/14/2023   Moderna SARS-COV2 Booster Vaccination 12/25/2020   Moderna Sars-Covid-2 Vaccination 05/28/2022   PFIZER(Purple Top)SARS-COV-2 Vaccination 08/27/2019, 09/21/2019, 06/13/2020   Pfizer Covid-19 Vaccine Bivalent Booster 5y-11y 05/03/2021   Pneumococcal Conjugate-13 05/11/2013   Pneumococcal Polysaccharide-23 05/02/2015   Tdap 01/23/2021   Zoster Recombinant(Shingrix) 01/20/2021,  03/24/2021   Zoster, Unspecified 01/20/2021   Pertinent  Health Maintenance Due  Topic Date Due   INFLUENZA VACCINE  Completed   DEXA SCAN  Discontinued   Colonoscopy  Discontinued      11/29/2021    9:48 AM 12/15/2021    9:44 AM 08/01/2022   11:33 AM 11/12/2022    2:58 PM 05/29/2023    2:56 PM  Fall Risk  Falls in the past year? 0 1 0 0 0  Was there an injury with Fall? 0  0 0 0  Fall Risk Category Calculator 0  0 0 0  Fall Risk Category (Retired) Low  Low    (RETIRED) Patient Fall Risk Level Low fall risk Moderate fall risk High fall risk    Patient at Risk for Falls Due to Impaired balance/gait No Fall Risks  History of fall(s);Impaired balance/gait;Impaired mobility No Fall Risks  Fall risk Follow up Falls evaluation completed Falls evaluation completed Falls evaluation completed Falls evaluation completed Falls evaluation completed  Functional Status Survey:    Vitals:   05/29/23 1452  BP: 116/79  Pulse: 64  Resp: 17  Temp: (!) 97.5 F (36.4 C)  TempSrc: Temporal  SpO2: 93%  Weight: 119 lb 6.4 oz (54.2 kg)  Height: 5\' 2"  (1.575 m)   Body mass index is 21.84 kg/m. Physical Exam Vitals reviewed.  Constitutional:      Appearance: Normal appearance.  HENT:     Head: Normocephalic.     Nose: Nose normal.     Mouth/Throat:     Mouth: Mucous membranes are moist.     Pharynx: Oropharynx is clear.  Eyes:     Pupils: Pupils are equal, round, and reactive to light.  Cardiovascular:     Rate and Rhythm: Normal rate and regular rhythm.     Pulses: Normal pulses.     Heart sounds: Normal heart sounds. No murmur heard. Pulmonary:     Effort: Pulmonary effort is normal.     Breath sounds: Normal breath sounds.  Abdominal:     General: Abdomen is flat. Bowel sounds are normal.     Palpations: Abdomen is soft.  Musculoskeletal:        General: No swelling.     Cervical back: Neck supple.     Comments: Pain in her Right shoulder Was able to move it Passively  C/o  Pain with Abduction  Skin:    General: Skin is warm.  Neurological:     Mental Status: She is alert and oriented to person, place, and time.  Psychiatric:        Mood and Affect: Mood normal.        Thought Content: Thought content normal.     Labs reviewed: Recent Labs    08/22/22 1122 10/17/22 1602 01/28/23 1452  NA 139 136 137  K 3.9 4.1 3.6  CL 107 104 106  CO2 27 23 22   GLUCOSE 87 111* 112*  BUN 20 20 18   CREATININE 0.87 0.95 0.89  CALCIUM 9.7 9.3 9.6   Recent Labs    08/22/22 1122 10/17/22 1602 01/28/23 1452  AST 14* 20 17  ALT 15 23 21   ALKPHOS 84 78 79  BILITOT 0.4 0.2* 0.5  PROT 6.6 6.2* 6.3*  ALBUMIN 3.7 3.9 3.6   Recent Labs    08/22/22 1122 10/17/22 1602 01/28/23 1452  WBC 11.9* 10.8* 12.4*  NEUTROABS 9.4* 8.8* 10.3*  HGB 9.5* 13.8 13.7  HCT 31.9* 42.7 41.7  MCV 73.5* 88.4 96.5  PLT 480* 305 326   Lab Results  Component Value Date   TSH 1.445 10/17/2022   Lab Results  Component Value Date   HGBA1C 5.1 11/11/2019   Lab Results  Component Value Date   CHOL 151 11/28/2021   HDL 65 11/28/2021   LDLCALC 71 11/28/2021   LDLDIRECT 116.0 04/27/2019   TRIG 77 11/28/2021   CHOLHDL 3.0 11/11/2019    Significant Diagnostic Results in last 30 days:  No results found.  Assessment/Plan 1. Chronic right shoulder pain Start her on Tramadol for 2 weeks Hold Therapy D/W the Sister who is going to make appointment with Dr Rennis Chris We both agreed that Lynn [name] is not surgical Candidate More conservative approach would be better with her    Family/ staff Communication: Sister  Labs/tests ordered:

## 2023-05-30 ENCOUNTER — Encounter: Payer: Self-pay | Admitting: Internal Medicine

## 2023-06-27 ENCOUNTER — Telehealth: Payer: Medicare Other

## 2023-06-27 NOTE — Telephone Encounter (Signed)
A prescription request came through onbase for patient Atorvastatin and is requesting a 90 day refill to [facility] On the medication list its showing last refilled 01/06/2020

## 2023-07-15 ENCOUNTER — Encounter: Payer: Self-pay | Admitting: Internal Medicine

## 2023-07-15 ENCOUNTER — Encounter: Payer: Medicare Other | Admitting: Adult Health

## 2023-07-29 ENCOUNTER — Non-Acute Institutional Stay: Payer: Medicare Other | Admitting: Adult Health

## 2023-07-29 ENCOUNTER — Encounter: Payer: Self-pay | Admitting: Adult Health

## 2023-07-29 VITALS — BP 126/82 | HR 59 | Temp 98.0°F | Ht 62.0 in | Wt 118.0 lb

## 2023-07-29 DIAGNOSIS — M25511 Pain in right shoulder: Secondary | ICD-10-CM

## 2023-07-29 DIAGNOSIS — G8929 Other chronic pain: Secondary | ICD-10-CM

## 2023-07-29 DIAGNOSIS — E7800 Pure hypercholesterolemia, unspecified: Secondary | ICD-10-CM

## 2023-07-29 DIAGNOSIS — D509 Iron deficiency anemia, unspecified: Secondary | ICD-10-CM

## 2023-07-29 DIAGNOSIS — F5101 Primary insomnia: Secondary | ICD-10-CM | POA: Diagnosis not present

## 2023-07-29 DIAGNOSIS — I69354 Hemiplegia and hemiparesis following cerebral infarction affecting left non-dominant side: Secondary | ICD-10-CM

## 2023-07-29 DIAGNOSIS — H6123 Impacted cerumen, bilateral: Secondary | ICD-10-CM

## 2023-07-29 DIAGNOSIS — I10 Essential (primary) hypertension: Secondary | ICD-10-CM

## 2023-07-29 DIAGNOSIS — S91109A Unspecified open wound of unspecified toe(s) without damage to nail, initial encounter: Secondary | ICD-10-CM

## 2023-07-29 MED ORDER — TRAMADOL HCL 50 MG PO TABS: 25.0000 mg | ORAL_TABLET | Freq: Two times a day (BID) | ORAL | 0 refills | Status: AC | PRN

## 2023-07-29 NOTE — Progress Notes (Signed)
 Location:  [facility]   POS: clinic  Provider:  Bari America, ANP [facility] [phone]   Code Status: DNR most form  Goals of Care:     07/29/2023    2:17 PM  Advanced Directives  Does Patient Have a Medical Advance Directive? Yes  Type of Estate Agent of [place];Out of facility DNR (pink MOST or yellow form)  Does patient want to make changes to medical advance directive? No - Patient declined  Copy of Healthcare Power of Attorney in Chart? Yes - validated most recent copy scanned in chart (See row information)     Chief Complaint  Patient presents with   Medical Management of Chronic Issues    Medical Management of Chronic Issues. 4 Month Follow up. Complains of Sore Toe on Left Foot.    Quality Metric Gaps    To discuss need for Covid and AWV    HPI: Patient is a 78 y.o. female seen today for medical management of chronic diseases.    [place], [place]  PMH CVA with left sided weakness 10/2019, also HTN, MCI, hearing loss, osteoporosis, OA, Raynauds, depression, HLD, constipation, neuropathy, anemia.   Some difficulty hearing. Reports wax in her ears, nurse has flushed them before.   She reports she hit her foot on the wall and now has a skin tear on the left 4th toe. The area is tender. NO drainage or swelling.  She states she is taking Lunesta , does recently increased to help with insomnia by Dr Tasia. She is waiting on this to work.   Continues with periods of anxiety, currently on Wellbutrin and lexapro   Reports continued right shoulder pain. She is using tylenol  and voltaren . Seen by ortho and diagnosed with chronic rotator cuff tear arthropathy.  Has had an injection and does not feel it helped.   MMSE 29/30 12/28/22  HTN on lopressor  BP 126/82 BUN/CR 18/0.89  LDL 64 11/2022 on Lipitor   Anemia: has seen Dr Gatha and received iron infusion.  Hgb 13.27 January 2023 Past Medical History:  Diagnosis Date    Anxiety    Aortic atherosclerosis (HCC)    Arthritis    Complication of anesthesia    per pt, slow to wake up past sedation.   CVA (cerebral vascular accident) (HCC) 11/10/2019   Depression    Diverticulosis    Hiatal hernia    Hyperplastic colon polyp    Hypertension    Neuropathy    Osteoarthritis    Osteoporosis    Renal mass    Stomach upset    has a senstive stomach   Throat clearing    has a cough at times   Urinary incontinence    Vitamin D  deficiency     Past Surgical History:  Procedure Laterality Date   COLONOSCOPY     FACIAL COSMETIC SURGERY     over 15 years ago   FINGER TENDON REPAIR     middle rt hand-   HAMMER TOE SURGERY  12/27/2011   Procedure: HAMMER TOE CORRECTION;  Surgeon: Norleen Armor, MD;  Location: [facility];  Service: Orthopedics;  Laterality: Left;  2-4th    Allergies  Allergen Reactions   Chlorhexidine      Outpatient Encounter Medications as of 07/29/2023  Medication Sig   acetaminophen  (TYLENOL ) 500 MG tablet Take 1,000 mg by mouth in the morning and at bedtime.   atorvastatin  (LIPITOR) 40 MG tablet Take 1 tablet (40 mg total)  by mouth daily.   buPROPion (WELLBUTRIN XL) 150 MG 24 hr tablet Take 150 mg by mouth every morning.   clopidogrel  (PLAVIX ) 75 MG tablet Take 1 tablet (75 mg total) by mouth daily.   cycloSPORINE (RESTASIS) 0.05 % ophthalmic emulsion Place 1 drop into both eyes 2 (two) times daily.   diclofenac  Sodium (VOLTAREN ) 1 % GEL Apply 2 g topically 2 (two) times daily.   escitalopram  (LEXAPRO ) 10 MG tablet Take 10 mg by mouth daily.   Eszopiclone  3 MG TABS Take 3 mg by mouth at bedtime. Take immediately before bedtime   metoprolol  tartrate (LOPRESSOR ) 25 MG tablet Take 12.5 mg by mouth 2 (two) times daily.   NON FORMULARY Diet: Regular, Chopped/Dysphagia 3   Special Instructions: Aspiration precautions. May have bacon per SLP.   pantoprazole  (PROTONIX ) 20 MG tablet Take 20 mg by mouth daily.   polyethylene  glycol (MIRALAX  / GLYCOLAX ) 17 g packet Take 17 g by mouth 3 (three) times a week. Mon, wed, fri   senna (SENOKOT) 8.6 MG TABS tablet Take 2 tablets by mouth at bedtime as needed for mild constipation.   [DISCONTINUED] diclofenac  Sodium (VOLTAREN ) 1 % GEL Apply 2 g topically 4 (four) times daily. (Patient taking differently: Apply 2 g topically 2 (two) times daily.)   [DISCONTINUED] eszopiclone  (LUNESTA ) 2 MG TABS tablet Take 1 tablet (2 mg total) by mouth at bedtime. Take immediately before bedtime (Patient taking differently: Take 3 mg by mouth at bedtime. Take immediately before bedtime)   No facility-administered encounter medications on file as of 07/29/2023.    Review of Systems:  Review of Systems  Constitutional:  Negative for activity change, appetite change, chills, diaphoresis, fatigue, fever and unexpected weight change.  HENT:  Negative for congestion.   Respiratory:  Negative for cough, shortness of breath and wheezing.   Cardiovascular:  Negative for chest pain, palpitations and leg swelling.  Gastrointestinal:  Negative for abdominal distention, abdominal pain, constipation and diarrhea.  Genitourinary:  Negative for difficulty urinating and dysuria.  Musculoskeletal:  Positive for arthralgias and gait problem. Negative for back pain, joint swelling and myalgias.       Right shoulder pain  Skin:  Positive for wound.  Neurological:  Negative for dizziness, tremors, seizures, syncope, facial asymmetry, speech difficulty, weakness, light-headedness, numbness and headaches.  Psychiatric/Behavioral:  Positive for sleep disturbance. Negative for agitation, behavioral problems and confusion. The patient is nervous/anxious.     Health Maintenance  Topic Date Due   Medicare Annual Wellness (AWV)  Never done   COVID-19 Vaccine (9 - 2024-25 season) 07/09/2023   DTaP/Tdap/Td (2 - Td or Tdap) 01/24/2031   Pneumonia Vaccine 45+ Years old  Completed   INFLUENZA VACCINE  Completed    Hepatitis C Screening  Completed   Zoster Vaccines- Shingrix  Completed   HPV VACCINES  Aged Out   DEXA SCAN  Discontinued   Colonoscopy  Discontinued    Physical Exam: Vitals:   07/29/23 1413  BP: 126/82  Pulse: (!) 59  Temp: 98 F (36.7 C)  SpO2: 96%  Weight: 118 lb (53.5 kg)  Height: 5' 2 (1.575 m)   Body mass index is 21.58 kg/m. Physical Exam Vitals and nursing note reviewed.  Constitutional:      General: She is not in acute distress.    Appearance: She is not diaphoretic.  HENT:     Head: Normocephalic and atraumatic.     Right Ear: Tympanic membrane and ear canal normal.  Left Ear: Tympanic membrane and ear canal normal.     Mouth/Throat:     Mouth: Mucous membranes are moist.     Pharynx: Oropharynx is clear.  Eyes:     Conjunctiva/sclera: Conjunctivae normal.     Pupils: Pupils are equal, round, and reactive to light.  Neck:     Vascular: No JVD.  Cardiovascular:     Rate and Rhythm: Normal rate and regular rhythm.     Pulses:          Dorsalis pedis pulses are 1+ on the right side.       Posterior tibial pulses are 1+ on the right side.     Heart sounds: No murmur heard. Pulmonary:     Effort: Pulmonary effort is normal. No respiratory distress.     Breath sounds: Normal breath sounds. No wheezing.  Abdominal:     General: Bowel sounds are normal. There is no distension.     Palpations: Abdomen is soft.     Tenderness: There is no abdominal tenderness.  Musculoskeletal:     Right foot: No deformity or Charcot foot.  Feet:     Right foot:     Protective Sensation: 5 sites tested.  5 sites sensed.     Skin integrity: Skin breakdown present. No ulcer, blister or warmth.     Left foot:     Protective Sensation: 5 sites tested.       Comments: Rubor color both feet Skin tear 100% pink tissue to left 4th toe and also a small open area to the left 3rd toe with mild surrounding eyrthema Skin:    General: Skin is warm and dry.  Neurological:      Mental Status: She is alert and oriented to person, place, and time.  Psychiatric:     Comments: anxious     Labs reviewed: Basic Metabolic Panel: Recent Labs    08/22/22 1122 10/17/22 1602 01/28/23 1452  NA 139 136 137  K 3.9 4.1 3.6  CL 107 104 106  CO2 27 23 22   GLUCOSE 87 111* 112*  BUN 20 20 18   CREATININE 0.87 0.95 0.89  CALCIUM  9.7 9.3 9.6  TSH  --  1.445  --    Liver Function Tests: Recent Labs    08/22/22 1122 10/17/22 1602 01/28/23 1452  AST 14* 20 17  ALT 15 23 21   ALKPHOS 84 78 79  BILITOT 0.4 0.2* 0.5  PROT 6.6 6.2* 6.3*  ALBUMIN 3.7 3.9 3.6   No results for input(s): LIPASE, AMYLASE in the last 8760 hours. No results for input(s): AMMONIA in the last 8760 hours. CBC: Recent Labs    08/22/22 1122 10/17/22 1602 01/28/23 1452  WBC 11.9* 10.8* 12.4*  NEUTROABS 9.4* 8.8* 10.3*  HGB 9.5* 13.8 13.7  HCT 31.9* 42.7 41.7  MCV 73.5* 88.4 96.5  PLT 480* 305 326   Lipid Panel: No results for input(s): CHOL, HDL, LDLCALC, TRIG, CHOLHDL, LDLDIRECT in the last 8760 hours. Lab Results  Component Value Date   HGBA1C 5.1 11/11/2019    Procedures since last visit: No results found.  Assessment/Plan  1. Chronic right shoulder pain (Primary) Follow up with ortho as indicated Use ultram  when needed   - traMADol  (ULTRAM ) 50 MG tablet; Take 0.5 tablets (25 mg total) by mouth 2 (two) times daily as needed for up to 14 days.  Dispense: 14 tablet; Refill: 0  2. Bilateral impacted cerumen Cerumen impaction protocol  3. Primary insomnia Lunesta  recently  increased  DON at [facility] to help with Prior authorization form.   4. Pure hypercholesterolemia Continue Lipitor   5. Primary hypertension Controlled   6. Iron deficiency anemia, unspecified iron deficiency anemia type Needs labs F/u with Dr Gatha as needed   7. 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   8. Toe wound Skin tears from injury Apply adaptic to left 3rd and 4th toe and gauze, change qd    Labs/tests ordered:  * No order type specified *CBC iron level ferritin CMP in am Next appt:  F/U 3 months with Dr Charlanne

## 2023-07-30 LAB — BASIC METABOLIC PANEL WITH GFR
BUN: 16 (ref 4–21)
CO2: 26 — AB (ref 13–22)
Chloride: 103 (ref 99–108)
Creatinine: 0.8 (ref 0.5–1.1)
Glucose: 83
Potassium: 4.6 meq/L (ref 3.5–5.1)
Sodium: 137 (ref 137–147)

## 2023-07-30 LAB — CBC AND DIFFERENTIAL
HCT: 39 (ref 36–46)
Hemoglobin: 13.3 (ref 12.0–16.0)
Platelets: 258 10*3/uL (ref 150–400)
WBC: 6.8

## 2023-07-30 LAB — HEPATIC FUNCTION PANEL
ALT: 34 U/L (ref 7–35)
AST: 22 (ref 13–35)
Alkaline Phosphatase: 74 (ref 25–125)
Bilirubin, Total: 0.3

## 2023-07-30 LAB — IRON,TIBC AND FERRITIN PANEL
Ferritin: 85.5
Iron: 62
TIBC: 243
UIBC: 181

## 2023-07-30 LAB — CBC: RBC: 3.91 (ref 3.87–5.11)

## 2023-07-30 LAB — COMPREHENSIVE METABOLIC PANEL WITH GFR
Albumin: 3.8 (ref 3.5–5.0)
Calcium: 9.7 (ref 8.7–10.7)
Globulin: 2
eGFR: 75

## 2023-08-08 ENCOUNTER — Other Ambulatory Visit: Payer: Self-pay | Admitting: Adult Health

## 2023-08-08 MED ORDER — ESZOPICLONE 3 MG PO TABS: 3.0000 mg | ORAL_TABLET | Freq: Every day | ORAL | 3 refills | Status: DC

## 2023-09-25 ENCOUNTER — Ambulatory Visit: Payer: BLUE CROSS/BLUE SHIELD | Admitting: Neurology

## 2023-09-25 ENCOUNTER — Encounter: Payer: Self-pay | Admitting: Neurology

## 2023-09-25 VITALS — BP 107/58 | HR 60 | Ht 62.0 in | Wt 116.0 lb

## 2023-09-25 DIAGNOSIS — Z8673 Personal history of transient ischemic attack (TIA), and cerebral infarction without residual deficits: Secondary | ICD-10-CM

## 2023-09-25 NOTE — Patient Instructions (Addendum)
 Good to see you.  Continue all your medications  2. Consider discussing right shoulder pain with Ortho again  3. Monitor for depressive symptoms, consider counseling  4. Continue PT, OT  5. Follow-up in 1 year, call for any changes

## 2023-09-25 NOTE — Progress Notes (Addendum)
 NEUROLOGY FOLLOW UP OFFICE NOTE  Lynn [name] [id number] 1945-10-02  HISTORY OF PRESENT ILLNESS: I had the pleasure of seeing Lynn [name] in follow-up in the neurology clinic on 09/25/2023.  The patient was last seen over a year ago for bilateral basal ganglia strokes that occurred in April 2021. Her sister Lynn [name] is again present to provide additional information. Lynn [name] sent a letter prior to the visit detailing changes. Speech seems labored and sometimes retrieval is challenging, voice is low and almost guttural, hard for others to understand. Her fingers are curling, initially one, now 3. She will not do exercises and occasionally wears the flexible splints but cannot seem to retain the instructions to keep it on. Her closet is easy access and organized but over the past year she has only chosen 3-4 outfits, repeats them over and over. She has a habit of choosing the next day's clothes and hanging them on door handle. By 3pm, she expects staff to turn her bed back for bedtime and becomes anxious asking nurses when they are coming. She expects very quick responses from staff to any request and seems to obsess easily. It appears her reclusiveness is advancing, she no longer goes to breakfast and stays in her room 24/7 except for lunch and dinner. She does not attend activities/programs offered. She watches one channel almost exclusively. She does love visits from family. PT had done wonders for her but she does not do home exercises so she loses strides made.   She denies any headaches, dizziness, focal numbness/tingling. She has noticed some swallowing difficulties, she has to swallow slowly. Voice is hoarse. She has been dealing with raising her right arm with right shoulder pain, the 3 middle digits have been curling in and it is somewhat painful. She has been working with OT and they have been splinting the fingers with not much improvement. Due to right shoulder pain, she has been having  difficulty holding on to her walker so she has been mostly wheelchair-bound, she has the walker for transfers but staff would like to do PT to help with her walking. She had seen Ortho and had an xray, they have been told surgery is not an option She had an injection x 1 which did not help but she has not followed up. Mood is down sometimes. She sees Dr. Alisia [name] and takes Wellbutrin and Lexapro. Lynn [name] notes she has lost a lot of weight. She says she joins the group exercises at [facility] but does not go that often ("1-2 a week"), she states it is kind of fun.    History on Initial Assessment 05/09/2017: This is a pleasant 78 yo RH woman with a history of hypertension, who presented for evaluation of gait unsteadiness. She had been seeing Neurosurgery for back pain. She reported symptoms started a couple of years ago, but worsened in the past 6-8 months. She had fractured her left foot and was doing PT, noting difficulties with heel-toe walking, worse when she closes her eyes. She has come very close to falling, she was drying her hair bending forward and pitched forward. She has numbness and tingling in both hands and feet up to the mid-calf region. She has occasional sharp pains in both feet. Over the past couple of years, she has noticed clumsiness in both hands, worse the past year. If she does much lifting, she starts having back pain. She denies any neck pain except when bending head forward too much. She has some stress incontinence, no  bowel dysfunction. She has occasional headaches associated with eye issues. She has a little blurred vision with dry eye. Over the past 1.5 years, she has noticed a change in her speech, her voice has thickened and she is more hesitant when talking, worse this past year. She has also noticed some anxiety worsens it as well. She occasionally chokes on her food. She denies any family history of similar symptoms. She had an MRI brain without contrast at [facility]  last 02/28/17, images unavailable for review, there were no acute changes. There was mild FLAIR white matter changes, normal brain volume.   Update 12/07/2019: She presents today in a stretcher, with her sister present to provide additional information. Records were reviewed. Unfortunately she had bilateral strokes last month. She was brought to the ER on 4/20 for "slow thinking" and expressive aphasia. She was noted to have left-sided weakness and expressive aphasia in the ER. Family had noticed she had slowed down in the past few years since moving in to her mother's home, in the past 6 months she had not cared for her appearance and was walking with a cane due to gait unsteadiness. I personally reviewed MRI brain without contrast which showed patchy acute infarcts involving the basal ganglia/corona radiata bilaterally, left greater than right. There was mildly age advanced chronic microvascular disease. CTA head and neck no significant stenosis. LDL elevated, HbA1c 5.1. During her hospitalization, she was noted to have mild dysarthria and psychomotor slowing, left UE 0/5, LLE 3/5 proximally, 4/5 distally, 4/5 right UE and LE with right foot drop. Etiology of stroke unclear, possibly small vessel disease, however given bilateral involvement, 30-day cardiac monitor was recommended. She was discharged on DAPT for 3 months, then Plavix alone. She had an episode of decreased responsiveness felt due to toxic/metabolic/infectious cause, WBC was 20.6. Outpatient cognitive testing was recommended, however unable to perform today due to her mental status. Patient is drowsy, arousable to say "hi," recognizing the examiner from our prior visits, able to follow simple commands on the right arm. Flaccid left, unable to move both LE much.   PAST MEDICAL HISTORY: Past Medical History:  Diagnosis Date   Anxiety    Aortic atherosclerosis (HCC)    Arthritis    Complication of anesthesia    per pt, slow to wake up past  sedation.   CVA (cerebral vascular accident) (HCC) 11/10/2019   Depression    Diverticulosis    Hiatal hernia    Hyperplastic colon polyp    Hypertension    Neuropathy    Osteoarthritis    Osteoporosis    Renal mass    Stomach upset    has a senstive stomach   Throat clearing    has a cough at times   Urinary incontinence    Vitamin D deficiency     MEDICATIONS: Current Outpatient Medications on File Prior to Visit  Medication Sig Dispense Refill   acetaminophen (TYLENOL) 500 MG tablet Take 1,000 mg by mouth in the morning and at bedtime.     atorvastatin (LIPITOR) 40 MG tablet Take 1 tablet (40 mg total) by mouth daily. 30 tablet 0   buPROPion (WELLBUTRIN XL) 150 MG 24 hr tablet Take 150 mg by mouth every morning.     clopidogrel (PLAVIX) 75 MG tablet Take 1 tablet (75 mg total) by mouth daily. 30 tablet 0   cycloSPORINE (RESTASIS) 0.05 % ophthalmic emulsion Place 1 drop into both eyes 2 (two) times daily.     diclofenac  Sodium (VOLTAREN) 1 % GEL Apply 2 g topically 2 (two) times daily.     escitalopram (LEXAPRO) 10 MG tablet Take 10 mg by mouth daily.     Eszopiclone 3 MG TABS Take 1 tablet (3 mg total) by mouth at bedtime. Take immediately before bedtime 30 tablet 3   metoprolol tartrate (LOPRESSOR) 25 MG tablet Take 12.5 mg by mouth 2 (two) times daily.     NON FORMULARY Diet: Regular, Chopped/Dysphagia 3   Special Instructions: Aspiration precautions. May have bacon per SLP.     pantoprazole (PROTONIX) 20 MG tablet Take 20 mg by mouth daily.     polyethylene glycol (MIRALAX / GLYCOLAX) 17 g packet Take 17 g by mouth 3 (three) times a week. Mon, wed, fri     senna (SENOKOT) 8.6 MG TABS tablet Take 2 tablets by mouth at bedtime as needed for mild constipation.     No current facility-administered medications on file prior to visit.    ALLERGIES: Allergies  Allergen Reactions   Chlorhexidine     FAMILY HISTORY: Family History  Problem Relation Age of Onset    Arthritis Mother    Colon cancer Father    COPD Father 57       died at 38   Throat cancer Brother    Breast cancer Other    Esophageal cancer Neg Hx    Rectal cancer Neg Hx    Stomach cancer Neg Hx    Stroke Neg Hx     SOCIAL HISTORY: Social History   Socioeconomic History   Marital status: Divorced    Spouse name: Not on file   Number of children: 1   Years of education: 16   Highest education level: Not on file  Occupational History   Occupation: retired  Tobacco Use   Smoking status: Former    Current packs/day: 0.00    Types: Cigarettes    Start date: 12/20/1965    Quit date: 12/21/1975    Years since quitting: 47.7   Smokeless tobacco: Never  Vaping Use   Vaping status: Never Used  Substance and Sexual Activity   Alcohol use: Not Currently    Comment: rarely   Drug use: No   Sexual activity: Not Currently  Other Topics Concern   Not on file  Social History Narrative   Divorced. Children: 1 son. Pt is at [facility]        Retired from [employer]  Education: college.       Hobbies: riding horses in the past, family time, enjoys some gardening      Right handed    Social Drivers of Corporate investment banker Strain: Not on file  Food Insecurity: Not on file  Transportation Needs: Not on file  Physical Activity: Not on file  Stress: Not on file  Social Connections: Not on file  Intimate Partner Violence: Not on file     PHYSICAL EXAM: Vitals:   09/25/23 1504  BP: (!) 107/58  Pulse: 60  SpO2: 96%   General: No acute distress, sitting on wheelchair Head:  Normocephalic/atraumatic Skin/Extremities: No rash, no edema Neurological Exam: alert and awake. No aphasia or dysarthria. Fund of knowledge is appropriate.  Attention and concentration are normal.   Cranial nerves: Pupils equal, round. Extraocular movements intact with no nystagmus. Visual fields full.  No facial asymmetry.  Motor: Pain on right shoulder abduction, 4/5 right  finger extension. 5/5 on left UE, both LE. Finger to nose testing  intact, no ataxia today.  Gait not tested.    IMPRESSION: This is a pleasant 78 yo RH woman with a history of hypertension, initially seen for gait unsteadiness felt due to neuropathy, who had bilateral basal ganglia strokes, L>R in April 2021. Her sister reports that she has less motivation and more anxiety. She sees Psychiatry, we discussed considering seeing a therapist again as well. She does not recall seeing the counselor previously. She continues to have right shoulder pain, advised to reconnect with Ortho. Continue PT/OT. Continue all medications, Plavix and control of vascular risk factors for secondary stroke prevention. Follow-up in 1 year, call for any changes.    Thank you for allowing me to participate in her care.  Please do not hesitate to call for any questions or concerns.  The duration of this appointment visit was 30 minutes of face-to-face time with the patient.  Greater than 50% of this time was spent in counseling, explanation of diagnosis, planning of further management, and coordination of care.  Patrcia Dolly, M.D.   CC: Dr. Chales Abrahams

## 2023-10-21 NOTE — Addendum Note (Signed)
 Addended by: Van Clines on: 10/21/2023 01:00 AM   Modules accepted: Level of Service

## 2023-10-29 ENCOUNTER — Non-Acute Institutional Stay: Payer: Medicare Other | Admitting: Internal Medicine

## 2023-10-29 ENCOUNTER — Encounter: Payer: Self-pay | Admitting: Internal Medicine

## 2023-10-29 VITALS — BP 110/60 | HR 59 | Temp 97.6°F | Resp 20 | Ht 62.0 in | Wt 116.0 lb

## 2023-10-29 DIAGNOSIS — F418 Other specified anxiety disorders: Secondary | ICD-10-CM | POA: Diagnosis not present

## 2023-10-29 DIAGNOSIS — I69354 Hemiplegia and hemiparesis following cerebral infarction affecting left non-dominant side: Secondary | ICD-10-CM | POA: Diagnosis not present

## 2023-10-29 DIAGNOSIS — M810 Age-related osteoporosis without current pathological fracture: Secondary | ICD-10-CM

## 2023-10-29 DIAGNOSIS — E7800 Pure hypercholesterolemia, unspecified: Secondary | ICD-10-CM

## 2023-10-29 DIAGNOSIS — K219 Gastro-esophageal reflux disease without esophagitis: Secondary | ICD-10-CM

## 2023-10-29 DIAGNOSIS — M25511 Pain in right shoulder: Secondary | ICD-10-CM | POA: Diagnosis not present

## 2023-10-29 DIAGNOSIS — F5101 Primary insomnia: Secondary | ICD-10-CM

## 2023-10-29 DIAGNOSIS — G8929 Other chronic pain: Secondary | ICD-10-CM

## 2023-10-29 DIAGNOSIS — D509 Iron deficiency anemia, unspecified: Secondary | ICD-10-CM

## 2023-10-29 NOTE — Progress Notes (Signed)
 Location:  [facility] features editor of Service:  Clinic (12)  Provider:   Code Status:  Goals of Care:     09/25/2023    3:14 PM  Advanced Directives  Does Patient Have a Medical Advance Directive? Yes  Type of Estate agent of [place];Living will;Out of facility DNR (pink MOST or yellow form)     Chief Complaint  Patient presents with   Follow-up    3 month follow up. Patient declined AWV    HPI: Patient is a 78 y.o. female seen today for medical management of chronic diseases.    Lives in [place] in [place]   Patient has a history of hypertension, hyperlipidemia, depression and anxiety, osteoporosis   bilateral CVAs left greater than right in 2021 Followed By Dysphagia , Dysarthria,  Iron Def Anemia now resolved with Iron infusion  History of Present Illness   Acute issues  Right Shoulder pain Chronic  Has seen Ortho Dr Lova Rua in 12/24 Per his note she has Right shoulder rotator cuff tear arthropathy.  And not have options beside surgery He did do Shoulder injection Uses Tylenol  and Voltaren  Insomnia Says lunesta  doesn't work as good Have tried Trazodone before which she didn't like either  She is able to manage in [place] with help of Home care 2/week Uses Power chair    Past Medical History:  Diagnosis Date   Anxiety    Aortic atherosclerosis (HCC)    Arthritis    Complication of anesthesia    per pt, slow to wake up past sedation.   CVA (cerebral vascular accident) (HCC) 11/10/2019   Depression    Diverticulosis    Hiatal hernia    Hyperplastic colon polyp    Hypertension    Neuropathy    Osteoarthritis    Osteoporosis    Renal mass    Stomach upset    has a senstive stomach   Throat clearing    has a cough at times   Urinary incontinence    Vitamin D  deficiency     Past Surgical History:  Procedure Laterality Date   COLONOSCOPY     FACIAL COSMETIC SURGERY     over 15 years ago   FINGER TENDON  REPAIR     middle rt hand-   HAMMER TOE SURGERY  12/27/2011   Procedure: HAMMER TOE CORRECTION;  Surgeon: Amada Backer, MD;  Location: [facility];  Service: Orthopedics;  Laterality: Left;  2-4th    Allergies  Allergen Reactions   Chlorhexidine      Outpatient Encounter Medications as of 10/29/2023  Medication Sig   acetaminophen  (TYLENOL ) 500 MG tablet Take 1,000 mg by mouth in the morning and at bedtime.   atorvastatin  (LIPITOR) 40 MG tablet Take 1 tablet (40 mg total) by mouth daily.   buPROPion (WELLBUTRIN XL) 150 MG 24 hr tablet Take 150 mg by mouth every morning.   clopidogrel  (PLAVIX ) 75 MG tablet Take 1 tablet (75 mg total) by mouth daily.   cycloSPORINE (RESTASIS) 0.05 % ophthalmic emulsion 1 drop 2 (two) times daily.   diclofenac  Sodium (VOLTAREN ) 1 % GEL Apply 2 g topically as needed.   escitalopram  (LEXAPRO ) 10 MG tablet Take 10 mg by mouth daily.   Eszopiclone  3 MG TABS Take 3 mg by mouth at bedtime. Take immediately before bedtime   metoprolol  tartrate (LOPRESSOR ) 25 MG tablet Take 12.5 mg by mouth 2 (two) times daily.   NON FORMULARY Diet: Regular, Chopped/Dysphagia 3  Special Instructions: Aspiration precautions. May have bacon per SLP.   pantoprazole  (PROTONIX ) 20 MG tablet Take 20 mg by mouth daily.   polyethylene glycol (MIRALAX  / GLYCOLAX ) 17 g packet Take 17 g by mouth 3 (three) times a week. Mon, wed, fri   senna (SENOKOT) 8.6 MG TABS tablet Take 1 tablet by mouth daily as needed for mild constipation. 2 tabs, oral, once a day, give 2 tabs of Senna S for constipation. Hold for loose stools. Please document last stool if refused 3 times in a row   No facility-administered encounter medications on file as of 10/29/2023.    Review of Systems:  Review of Systems  Constitutional:  Negative for activity change and appetite change.  HENT: Negative.    Respiratory:  Negative for cough and shortness of breath.   Cardiovascular:  Negative for leg swelling.   Gastrointestinal:  Negative for constipation.  Genitourinary: Negative.   Musculoskeletal:  Positive for arthralgias and gait problem. Negative for myalgias.  Skin: Negative.   Neurological:  Negative for dizziness and weakness.  Psychiatric/Behavioral:  Positive for confusion, dysphoric mood and sleep disturbance.     Health Maintenance  Topic Date Due   Medicare Annual Wellness (AWV)  11/28/2023 (Originally 05/22/1946)   COVID-19 Vaccine (9 - 2024-25 season) 05/13/2024 (Originally 07/09/2023)   INFLUENZA VACCINE  02/21/2024   DTaP/Tdap/Td (2 - Td or Tdap) 01/24/2031   Pneumonia Vaccine 13+ Years old  Completed   Hepatitis C Screening  Completed   Zoster Vaccines- Shingrix  Completed   HPV VACCINES  Aged Out   DEXA SCAN  Discontinued   Colonoscopy  Discontinued    Physical Exam: Vitals:   10/29/23 0851  BP: 110/60  Pulse: (!) 59  Resp: 20  Temp: 97.6 F (36.4 C)  SpO2: 98%  Weight: 116 lb (52.6 kg)  Height: 5\' 2"  (1.575 m)   Body mass index is 21.22 kg/m. Physical Exam Vitals reviewed.  Constitutional:      Appearance: Normal appearance.  HENT:     Head: Normocephalic.     Nose: Nose normal.     Mouth/Throat:     Mouth: Mucous membranes are moist.     Pharynx: Oropharynx is clear.  Eyes:     Pupils: Pupils are equal, round, and reactive to light.  Cardiovascular:     Rate and Rhythm: Normal rate and regular rhythm.     Pulses: Normal pulses.     Heart sounds: Normal heart sounds. No murmur heard. Pulmonary:     Effort: Pulmonary effort is normal.     Breath sounds: Normal breath sounds.  Abdominal:     General: Abdomen is flat. Bowel sounds are normal.     Palpations: Abdomen is soft.  Musculoskeletal:        General: No swelling.     Cervical back: Neck supple.  Skin:    General: Skin is warm.  Neurological:     Mental Status: She is alert and oriented to person, place, and time.  Psychiatric:        Mood and Affect: Mood normal.        Thought  Content: Thought content normal.     Labs reviewed: Basic Metabolic Panel: Recent Labs    01/28/23 1452  NA 137  K 3.6  CL 106  CO2 22  GLUCOSE 112*  BUN 18  CREATININE 0.89  CALCIUM  9.6   Liver Function Tests: Recent Labs    01/28/23 1452  AST 17  ALT  21  ALKPHOS 79  BILITOT 0.5  PROT 6.3*  ALBUMIN 3.6   No results for input(s): "LIPASE", "AMYLASE" in the last 8760 hours. No results for input(s): "AMMONIA" in the last 8760 hours. CBC: Recent Labs    01/28/23 1452  WBC 12.4*  NEUTROABS 10.3*  HGB 13.7  HCT 41.7  MCV 96.5  PLT 326   Lipid Panel: No results for input(s): "CHOL", "HDL", "LDLCALC", "TRIG", "CHOLHDL", "LDLDIRECT" in the last 8760 hours. Lab Results  Component Value Date   HGBA1C 5.1 11/11/2019    Procedures since last visit: No results found.  Assessment/Plan 1. Chronic right shoulder pain (Primary) Tylenol  Right shoulder rotator cuff tear arthropathy.  Got Corticoid Injection in 12/24 2. 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 Age-related osteoporosis without current pathological fracture No treatment per her wishes  4. Depression with anxiety Wellbutrin and Lexapro   5. Iron deficiency anemia, unspecified iron deficiency anemia type HGB stable Got Iron Infusion in the past Colonoscopy in 2020 Occult negative 6. Pure hypercholesterolemia statin  7. Gastroesophageal reflux disease without esophagitis Protonix   8. Primary insomnia Wanted me to write for Trazodone again as PRN She is already on Lunesta  Which she says work but then she gets up at night and Cannot sleep 9 Hypertension Low dose of Metoprolol        Assessment & Plan      Labs/tests ordered:  Labs before the visit Next appt:  Visit date not found

## 2023-11-28 LAB — LIPID PANEL
Cholesterol: 148 (ref 0–200)
HDL: 64 (ref 35–70)
LDL Cholesterol: 62
Triglycerides: 108 (ref 40–160)

## 2023-12-02 ENCOUNTER — Encounter: Payer: Self-pay | Admitting: Adult Health

## 2023-12-02 ENCOUNTER — Non-Acute Institutional Stay: Admitting: Adult Health

## 2023-12-02 VITALS — BP 118/80 | HR 60 | Temp 97.7°F | Resp 18 | Ht 62.0 in | Wt 116.0 lb

## 2023-12-02 DIAGNOSIS — Z0000 Encounter for general adult medical examination without abnormal findings: Secondary | ICD-10-CM | POA: Diagnosis not present

## 2023-12-02 NOTE — Patient Instructions (Signed)
 Lynn [name] , Thank you for taking time to come for your Medicare Wellness Visit. I appreciate your ongoing commitment to your health goals. Please review the following plan we discussed and let me know if I can assist you in the future.   Screening recommendations/referrals: Colonoscopy aged out Mammogram aged out Bone Density delcined Recommended yearly ophthalmology/optometry visit for glaucoma screening and checkup Recommended yearly dental visit for hygiene and checkup  Vaccinations: Influenza vaccine- due annually in September/October Pneumococcal vaccine recommended Prevnar 20  Tdap vaccine up to date Shingles vaccine up to date    Advanced directives: reviewed   Conditions/risks identified: fall risk   Next appointment: 1 year   Preventive Care 79 Years and Older, Female Preventive care refers to lifestyle choices and visits with your health care provider that can promote health and wellness. What does preventive care include? A yearly physical exam. This is also called an annual well check. Dental exams once or twice a year. Routine eye exams. Ask your health care provider how often you should have your eyes checked. Personal lifestyle choices, including: Daily care of your teeth and gums. Regular physical activity. Eating a healthy diet. Avoiding tobacco and drug use. Limiting alcohol use. Practicing safe sex. Taking low-dose aspirin  every day. Taking vitamin and mineral supplements as recommended by your health care provider. What happens during an annual well check? The services and screenings done by your health care provider during your annual well check will depend on your age, overall health, lifestyle risk factors, and family history of disease. Counseling  Your health care provider may ask you questions about your: Alcohol use. Tobacco use. Drug use. Emotional well-being. Home and relationship well-being. Sexual activity. Eating habits. History of  falls. Memory and ability to understand (cognition). Work and work Astronomer. Reproductive health. Screening  You may have the following tests or measurements: Height, weight, and BMI. Blood pressure. Lipid and cholesterol levels. These may be checked every 5 years, or more frequently if you are over 79 years old. Skin check. Lung cancer screening. You may have this screening every year starting at age 79 if you have a 30-pack-year history of smoking and currently smoke or have quit within the past 15 years. Fecal occult blood test (FOBT) of the stool. You may have this test every year starting at age 79. Flexible sigmoidoscopy or colonoscopy. You may have a sigmoidoscopy every 5 years or a colonoscopy every 10 years starting at age 79. Hepatitis C blood test. Hepatitis B blood test. Sexually transmitted disease (STD) testing. Diabetes screening. This is done by checking your blood sugar (glucose) after you have not eaten for a while (fasting). You may have this done every 1-3 years. Bone density scan. This is done to screen for osteoporosis. You may have this done starting at age 79. Mammogram. This may be done every 79 years. Talk to your health care provider about how often you should have regular mammograms. Talk with your health care provider about your test results, treatment options, and if necessary, the need for more tests. Vaccines  Your health care provider may recommend certain vaccines, such as: Influenza vaccine. This is recommended every year. Tetanus, diphtheria, and acellular pertussis (Tdap, Td) vaccine. You may need a Td booster every 10 years. Zoster vaccine. You may need this after age 79. Pneumococcal 13-valent conjugate (PCV13) vaccine. One dose is recommended after age 79. Pneumococcal polysaccharide (PPSV23) vaccine. One dose is recommended after age 72. Talk to your health care provider about  which screenings and vaccines you need and how often you need  them. This information is not intended to replace advice given to you by your health care provider. Make sure you discuss any questions you have with your health care provider. Document Released: 08/05/2015 Document Revised: 03/28/2016 Document Reviewed: 05/10/2015 Elsevier Interactive Patient Education  2017 [employer].  Fall Prevention in the Home Falls can cause injuries. They can happen to people of all ages. There are many things you can do to make your home safe and to help prevent falls. What can I do on the outside of my home? Regularly fix the edges of walkways and driveways and fix any cracks. Remove anything that might make you trip as you walk through a door, such as a raised step or threshold. Trim any bushes or trees on the path to your home. Use bright outdoor lighting. Clear any walking paths of anything that might make someone trip, such as rocks or tools. Regularly check to see if handrails are loose or broken. Make sure that both sides of any steps have handrails. Any raised decks and porches should have guardrails on the edges. Have any leaves, snow, or ice cleared regularly. Use sand or salt on walking paths during winter. Clean up any spills in your garage right away. This includes oil or grease spills. What can I do in the bathroom? Use night lights. Install grab bars by the toilet and in the tub and shower. Do not use towel bars as grab bars. Use non-skid mats or decals in the tub or shower. If you need to sit down in the shower, use a plastic, non-slip stool. Keep the floor dry. Clean up any water that spills on the floor as soon as it happens. Remove soap buildup in the tub or shower regularly. Attach bath mats securely with double-sided non-slip rug tape. Do not have throw rugs and other things on the floor that can make you trip. What can I do in the bedroom? Use night lights. Make sure that you have a light by your bed that is easy to reach. Do not use  any sheets or blankets that are too big for your bed. They should not hang down onto the floor. Have a firm chair that has side arms. You can use this for support while you get dressed. Do not have throw rugs and other things on the floor that can make you trip. What can I do in the kitchen? Clean up any spills right away. Avoid walking on wet floors. Keep items that you use a lot in easy-to-reach places. If you need to reach something above you, use a strong step stool that has a grab bar. Keep electrical cords out of the way. Do not use floor polish or wax that makes floors slippery. If you must use wax, use non-skid floor wax. Do not have throw rugs and other things on the floor that can make you trip. What can I do with my stairs? Do not leave any items on the stairs. Make sure that there are handrails on both sides of the stairs and use them. Fix handrails that are broken or loose. Make sure that handrails are as long as the stairways. Check any carpeting to make sure that it is firmly attached to the stairs. Fix any carpet that is loose or worn. Avoid having throw rugs at the top or bottom of the stairs. If you do have throw rugs, attach them to the floor with  carpet tape. Make sure that you have a light switch at the top of the stairs and the bottom of the stairs. If you do not have them, ask someone to add them for you. What else can I do to help prevent falls? Wear shoes that: Do not have high heels. Have rubber bottoms. Are comfortable and fit you well. Are closed at the toe. Do not wear sandals. If you use a stepladder: Make sure that it is fully opened. Do not climb a closed stepladder. Make sure that both sides of the stepladder are locked into place. Ask someone to hold it for you, if possible. Clearly mark and make sure that you can see: Any grab bars or handrails. First and last steps. Where the edge of each step is. Use tools that help you move around (mobility aids)  if they are needed. These include: Canes. Walkers. Scooters. Crutches. Turn on the lights when you go into a dark area. Replace any light bulbs as soon as they burn out. Set up your furniture so you have a clear path. Avoid moving your furniture around. If any of your floors are uneven, fix them. If there are any pets around you, be aware of where they are. Review your medicines with your doctor. Some medicines can make you feel dizzy. This can increase your chance of falling. Ask your doctor what other things that you can do to help prevent falls. This information is not intended to replace advice given to you by your health care provider. Make sure you discuss any questions you have with your health care provider. Document Released: 05/05/2009 Document Revised: 12/15/2015 Document Reviewed: 08/13/2014 Elsevier Interactive Patient Education  2017 [employer].

## 2023-12-02 NOTE — Progress Notes (Signed)
 Subjective:   Lynn [name] is a 78 y.o. female who presents for Medicare Annual (Subsequent) preventive examination at [facility]  Visit Complete: In person  Patient Medicare AWV questionnaire was completed by the patient on 12/02/23; I have confirmed that all information answered by patient is correct and no changes since this date.  Cardiac Risk Factors include: advanced age (>90men, >8 women);hypertension     Objective:     Today's Vitals   12/02/23 1319  BP: 118/80  Pulse: 60  Resp: 18  Temp: 97.7 F (36.5 C)  SpO2: 98%  Weight: 116 lb (52.6 kg)  Height: 5\' 2"  (1.575 m)   Body mass index is 21.22 kg/m.     12/02/2023    2:00 PM 09/25/2023    3:14 PM 07/29/2023    2:17 PM 05/29/2023    2:56 PM 03/20/2023    3:36 PM 03/12/2023    1:13 PM 03/12/2023    1:12 PM  Advanced Directives  Does Patient Have a Medical Advance Directive? Yes Yes Yes Yes Yes Yes Yes  Type of Estate agent of [place];Living will;Out of facility DNR (pink MOST or yellow form) Healthcare Power of [place];Living will;Out of facility DNR (pink MOST or yellow form) Healthcare Power of [place];Out of facility DNR (pink MOST or yellow form) Out of facility DNR (pink MOST or yellow form);Healthcare Power of [employer] of [place];Out of facility DNR (pink MOST or yellow form) Healthcare Power of [place];Out of facility DNR (pink MOST or yellow form)   Does patient want to make changes to medical advance directive? No - Patient declined  No - Patient declined No - Patient declined No - Patient declined No - Patient declined No - Patient declined  Copy of Healthcare Power of Attorney in Chart? Yes - validated most recent copy scanned in chart (See row information)  Yes - validated most recent copy scanned in chart (See row information) Yes - validated most recent copy scanned in chart (See row information) Yes - validated most recent copy scanned in chart  (See row information) Yes - validated most recent copy scanned in chart (See row information)   Pre-existing out of facility DNR order (yellow form or pink MOST form)     Pink MOST form placed in chart (order not valid for inpatient use);Yellow form placed in chart (order not valid for inpatient use) Pink MOST form placed in chart (order not valid for inpatient use)     Current Medications (verified) Outpatient Encounter Medications as of 12/02/2023  Medication Sig   acetaminophen  (TYLENOL ) 500 MG tablet Take 1,000 mg by mouth in the morning and at bedtime.   atorvastatin  (LIPITOR) 40 MG tablet Take 1 tablet (40 mg total) by mouth daily.   buPROPion (WELLBUTRIN XL) 150 MG 24 hr tablet Take 150 mg by mouth every morning.   clopidogrel  (PLAVIX ) 75 MG tablet Take 1 tablet (75 mg total) by mouth daily.   cycloSPORINE (RESTASIS) 0.05 % ophthalmic emulsion 1 drop 2 (two) times daily.   diclofenac  Sodium (VOLTAREN ) 1 % GEL Apply 2 g topically as needed.   escitalopram  (LEXAPRO ) 10 MG tablet Take 10 mg by mouth daily.   Eszopiclone  3 MG TABS Take 3 mg by mouth at bedtime. Take immediately before bedtime   metoprolol  tartrate (LOPRESSOR ) 25 MG tablet Take 12.5 mg by mouth 2 (two) times daily.   NON FORMULARY Diet: Regular, Chopped/Dysphagia 3   Special Instructions: Aspiration precautions. May have bacon per  SLP.   pantoprazole  (PROTONIX ) 20 MG tablet Take 20 mg by mouth daily.   polyethylene glycol (MIRALAX  / GLYCOLAX ) 17 g packet Take 17 g by mouth 3 (three) times a week. Mon, wed, fri   senna (SENOKOT) 8.6 MG TABS tablet Take 1 tablet by mouth daily as needed for mild constipation. 2 tabs, oral, once a day, give 2 tabs of Senna S for constipation. Hold for loose stools. Please document last stool if refused 3 times in a row   traZODone (DESYREL) 50 MG tablet Take 50 mg by mouth at bedtime. 25 mg, oral, At bedtime, Trazodone 25 mg po at bedtime for insomnia   No facility-administered encounter  medications on file as of 12/02/2023.    Allergies (verified) Chlorhexidine    History: Past Medical History:  Diagnosis Date   Anxiety    Aortic atherosclerosis (HCC)    Arthritis    Complication of anesthesia    per pt, slow to wake up past sedation.   CVA (cerebral vascular accident) (HCC) 11/10/2019   Depression    Diverticulosis    Hiatal hernia    Hyperplastic colon polyp    Hypertension    Neuropathy    Osteoarthritis    Osteoporosis    Renal mass    Stomach upset    has a senstive stomach   Throat clearing    has a cough at times   Urinary incontinence    Vitamin D  deficiency    Past Surgical History:  Procedure Laterality Date   COLONOSCOPY     FACIAL COSMETIC SURGERY     over 15 years ago   FINGER TENDON REPAIR     middle rt hand-   HAMMER TOE SURGERY  12/27/2011   Procedure: HAMMER TOE CORRECTION;  Surgeon: Amada Backer, MD;  Location: [facility];  Service: Orthopedics;  Laterality: Left;  2-4th   Family History  Problem Relation Age of Onset   Arthritis Mother    Colon cancer Father    COPD Father 41       died at 67   Throat cancer Brother    Breast cancer Other    Esophageal cancer Neg Hx    Rectal cancer Neg Hx    Stomach cancer Neg Hx    Stroke Neg Hx    Social History   Socioeconomic History   Marital status: Divorced    Spouse name: Not on file   Number of children: 1   Years of education: 16   Highest education level: Not on file  Occupational History   Occupation: retired  Tobacco Use   Smoking status: Former    Current packs/day: 0.00    Types: Cigarettes    Start date: 12/20/1965    Quit date: 12/21/1975    Years since quitting: 47.9   Smokeless tobacco: Never  Vaping Use   Vaping status: Never Used  Substance and Sexual Activity   Alcohol use: Not Currently    Comment: rarely   Drug use: No   Sexual activity: Not Currently  Other Topics Concern   Not on file  Social History Narrative   Divorced. Children:  1 son. Pt is at [facility]        Retired from [employer]  Education: college.       Hobbies: riding horses in the past, family time, enjoys some gardening      Right handed    Social Drivers of Health   Financial Resource Strain: Not  on file  Food Insecurity: Not on file  Transportation Needs: Not on file  Physical Activity: Not on file  Stress: Not on file  Social Connections: Not on file    Tobacco Counseling Counseling given: Not Answered   Clinical Intake:  Pre-visit preparation completed: No  Pain : No/denies pain     BMI - recorded: 21.2 Nutritional Status: BMI of 19-24  Normal Nutritional Risks: None Diabetes: No  How often do you need to have someone help you when you read instructions, pamphlets, or other written materials from your doctor or pharmacy?: 3 - Sometimes What is the last grade level you completed in school?: College  Interpreter Needed?: No  Information entered by :: Raylene Calamity NP   Activities of Daily Living    12/02/2023    1:51 PM  In your present state of health, do you have any difficulty performing the following activities:  Hearing? 1  Vision? 1  Difficulty concentrating or making decisions? 0  Walking or climbing stairs? 1  Dressing or bathing? 1  Comment can dress herself, has home care fo rbathing  Doing errands, shopping? 1  Preparing Food and eating ? Y  Using the Toilet? Y  In the past six months, have you accidently leaked urine? N  Do you have problems with loss of bowel control? N  Managing your Medications? Y  Managing your Finances? Y  Housekeeping or managing your Housekeeping? Y    Patient Care Team: Marguerite Shiley, MD as PCP - General (Internal Medicine) Amada Backer, MD as Consulting Physician (Orthopedic Surgery) Jhonny Moss, MD as Consulting Physician (Neurology)  Indicate any recent Medical Services you may have received from other than [facility] providers in the past year (date may  be approximate).     Assessment:    This is a routine wellness examination for Cornelis Kluver.  Hearing/Vision screen Hearing Screening - Comments:: No hearing issues   Goals Addressed             This Visit's Progress    Patient Stated       Continue at the same functional level Goes to weight training classes attends twice a week       Depression Screen    12/02/2023    1:38 PM 07/29/2023    2:17 PM 05/29/2023    2:56 PM 11/12/2022    2:58 PM 08/07/2022    2:04 PM 04/27/2019   12:52 PM 08/05/2018    4:09 PM  PHQ 2/9 Scores  PHQ - 2 Score 0 0 0 0 0 0 1  PHQ- 9 Score      3     Fall Risk    12/02/2023    1:37 PM 09/25/2023    3:13 PM 07/29/2023    2:17 PM 05/29/2023    2:56 PM 11/12/2022    2:58 PM  Fall Risk   Falls in the past year? 0 0 0 0 0  Number falls in past yr: 0 0 0 0 0  Injury with Fall? 0 0 0 0 0  Risk for fall due to : Impaired balance/gait;Impaired mobility   No Fall Risks History of fall(s);Impaired balance/gait;Impaired mobility  Follow up Falls evaluation completed Falls evaluation completed  Falls evaluation completed Falls evaluation completed    MEDICARE RISK AT HOME: Medicare Risk at Home Any stairs in or around the home?: Yes If so, are there any without handrails?: No Home free of loose throw rugs in walkways, pet beds, electrical  cords, etc?: Yes Adequate lighting in your home to reduce risk of falls?: Yes Life alert?: No Use of a cane, walker or w/c?: Yes Grab bars in the bathroom?: Yes Shower chair or bench in shower?: Yes Elevated toilet seat or a handicapped toilet?: Yes  TIMED UP AND GO:  Was the test performed?  No    Cognitive Function:    08/01/2022   11:00 AM  MMSE - Mini Mental State Exam  Orientation to time 4  Orientation to Place 5  Registration 3  Attention/ Calculation 4  Recall 3  Language- name 2 objects 2  Language- repeat 1  Language- follow 3 step command 3  Language- read & follow direction 1  Write a sentence  1  Copy design 1  Total score 28        12/02/2023    1:38 PM  6CIT Screen  What Year? 0 points  What month? 0 points  What time? 0 points  Count back from 20 2 points  Months in reverse 0 points  Repeat phrase 0 points  Total Score 2 points    Immunizations Immunization History  Administered Date(s) Administered   Fluad Quad(high Dose 65+) 04/27/2019, 04/27/2022, 05/14/2023   Influenza, High Dose Seasonal PF 05/03/2017, 05/19/2018   Influenza,inj,Quad PF,6+ Mos 04/29/2013, 05/18/2014, 05/02/2015, 04/17/2016   Influenza-Unspecified 05/13/2020, 04/26/2021   Moderna Covid-19 Vaccine Bivalent Booster 18yrs & up 11/15/2022, 05/14/2023   Moderna SARS-COV2 Booster Vaccination 12/25/2020   Moderna Sars-Covid-2 Vaccination 05/28/2022   PFIZER(Purple Top)SARS-COV-2 Vaccination 08/27/2019, 09/21/2019, 06/13/2020   Pfizer Covid-19 Vaccine Bivalent Booster 5y-11y 05/03/2021   Pneumococcal Conjugate-13 05/11/2013   Pneumococcal Polysaccharide-23 05/02/2015   Tdap 01/23/2021   Zoster Recombinant(Shingrix) 01/20/2021, 03/24/2021   Zoster, Unspecified 01/20/2021    TDAP status: Up to date  Flu Vaccine status: Up to date  Pneumococcal vaccine status: Due, Education has been provided regarding the importance of this vaccine. Advised may receive this vaccine at local pharmacy or Health Dept. Aware to provide a copy of the vaccination record if obtained from local pharmacy or Health Dept. Verbalized acceptance and understanding.  Covid-19 vaccine status: Completed vaccines  Qualifies for Shingles Vaccine? Yes   Zostavax completed Yes   Shingrix Completed?: Yes  Screening Tests Health Maintenance  Topic Date Due   COVID-19 Vaccine (9 - 2024-25 season) 05/13/2024 (Originally 07/09/2023)   INFLUENZA VACCINE  02/21/2024   Medicare Annual Wellness (AWV)  12/01/2024   DTaP/Tdap/Td (2 - Td or Tdap) 01/24/2031   Pneumonia Vaccine 49+ Years old  Completed   Hepatitis C Screening  Completed    Zoster Vaccines- Shingrix  Completed   HPV VACCINES  Aged Out   Meningococcal B Vaccine  Aged Out   DEXA SCAN  Discontinued   Colonoscopy  Discontinued    Health Maintenance  There are no preventive care reminders to display for this patient.   Colorectal cancer screening: No longer required.   Mammogram status: No longer required due to age.  Bone Density status: Completed declined. Results reflect: Bone density results: NORMAL. Repeat every decilned years.  Lung Cancer Screening: (Low Dose CT Chest recommended if Age 43-80 years, 20 pack-year currently smoking OR have quit w/in 15years.) does not qualify.   Lung Cancer Screening Referral: NA  Additional Screening:  Hepatitis C Screening: does not qualify; Completed NA  Vision Screening: Recommended annual ophthalmology exams for early detection of glaucoma and other disorders of the eye. Is the patient up to date with their annual eye exam?  Yes  Who is the provider or what is the name of the office in which the patient attends annual eye exams? [facility]mology If pt is not established with a provider, would they like to be referred to a provider to establish care? No .   Dental Screening: Recommended annual dental exams for proper oral hygiene  Diabetic Foot Exam: not diabetic  Community Resource Referral / Chronic Care Management: CRR required this visit?  No   CCM required this visit?  No     Plan:     I have personally reviewed and noted the following in the patient's chart:   Medical and social history Use of alcohol, tobacco or illicit drugs  Current medications and supplements including opioid prescriptions. Patient is not currently taking opioid prescriptions. Functional ability and status Nutritional status Physical activity Advanced directives List of other physicians Hospitalizations, surgeries, and ER visits in previous 12 months Vitals Screenings to include cognitive, depression, and  falls Referrals and appointments  In addition, I have reviewed and discussed with patient certain preventive protocols, quality metrics, and best practice recommendations. A written personalized care plan for preventive services as well as general preventive health recommendations were provided to patient.     Raylene Calamity, NP   12/02/2023   After Visit Summary: (In Person-Printed) AVS printed and given to the patient  Nurse Notes: NA

## 2023-12-03 ENCOUNTER — Other Ambulatory Visit: Payer: Self-pay | Admitting: Orthopedic Surgery

## 2023-12-03 MED ORDER — ESZOPICLONE 3 MG PO TABS: 3.0000 mg | ORAL_TABLET | Freq: Every day | ORAL | 3 refills | Status: DC

## 2023-12-22 ENCOUNTER — Emergency Department (HOSPITAL_BASED_OUTPATIENT_CLINIC_OR_DEPARTMENT_OTHER)
Admission: EM | Admit: 2023-12-22 | Discharge: 2023-12-22 | Disposition: A | Discharge status: Home / Self Care | Attending: Emergency Medicine | Admitting: Emergency Medicine

## 2023-12-22 ENCOUNTER — Emergency Department (HOSPITAL_BASED_OUTPATIENT_CLINIC_OR_DEPARTMENT_OTHER): Admitting: Radiology

## 2023-12-22 ENCOUNTER — Emergency Department (HOSPITAL_BASED_OUTPATIENT_CLINIC_OR_DEPARTMENT_OTHER)

## 2023-12-22 ENCOUNTER — Encounter (HOSPITAL_BASED_OUTPATIENT_CLINIC_OR_DEPARTMENT_OTHER): Payer: Self-pay | Admitting: Emergency Medicine

## 2023-12-22 ENCOUNTER — Other Ambulatory Visit: Payer: Self-pay

## 2023-12-22 DIAGNOSIS — W01198A Fall on same level from slipping, tripping and stumbling with subsequent striking against other object, initial encounter: Secondary | ICD-10-CM | POA: Insufficient documentation

## 2023-12-22 DIAGNOSIS — M25562 Pain in left knee: Secondary | ICD-10-CM | POA: Insufficient documentation

## 2023-12-22 DIAGNOSIS — N3000 Acute cystitis without hematuria: Secondary | ICD-10-CM | POA: Diagnosis not present

## 2023-12-22 DIAGNOSIS — S0991XA Unspecified injury of ear, initial encounter: Secondary | ICD-10-CM | POA: Diagnosis present

## 2023-12-22 DIAGNOSIS — Z7901 Long term (current) use of anticoagulants: Secondary | ICD-10-CM | POA: Diagnosis not present

## 2023-12-22 DIAGNOSIS — M25561 Pain in right knee: Secondary | ICD-10-CM | POA: Insufficient documentation

## 2023-12-22 DIAGNOSIS — I6782 Cerebral ischemia: Secondary | ICD-10-CM | POA: Insufficient documentation

## 2023-12-22 DIAGNOSIS — S01311A Laceration without foreign body of right ear, initial encounter: Secondary | ICD-10-CM | POA: Insufficient documentation

## 2023-12-22 DIAGNOSIS — S0990XA Unspecified injury of head, initial encounter: Secondary | ICD-10-CM | POA: Insufficient documentation

## 2023-12-22 DIAGNOSIS — W19XXXA Unspecified fall, initial encounter: Secondary | ICD-10-CM

## 2023-12-22 LAB — CBC WITH DIFFERENTIAL/PLATELET
Abs Immature Granulocytes: 0.06 10*3/uL (ref 0.00–0.07)
Basophils Absolute: 0 10*3/uL (ref 0.0–0.1)
Basophils Relative: 0 %
Eosinophils Absolute: 0.1 10*3/uL (ref 0.0–0.5)
Eosinophils Relative: 1 %
HCT: 40.3 % (ref 36.0–46.0)
Hemoglobin: 13 g/dL (ref 12.0–15.0)
Immature Granulocytes: 1 %
Lymphocytes Relative: 9 %
Lymphs Abs: 0.9 10*3/uL (ref 0.7–4.0)
MCH: 33.1 pg (ref 26.0–34.0)
MCHC: 32.3 g/dL (ref 30.0–36.0)
MCV: 102.5 fL — ABNORMAL HIGH (ref 80.0–100.0)
Monocytes Absolute: 0.9 10*3/uL (ref 0.1–1.0)
Monocytes Relative: 8 %
Neutro Abs: 8.7 10*3/uL — ABNORMAL HIGH (ref 1.7–7.7)
Neutrophils Relative %: 81 %
Platelets: 246 10*3/uL (ref 150–400)
RBC: 3.93 MIL/uL (ref 3.87–5.11)
RDW: 13.4 % (ref 11.5–15.5)
WBC: 10.7 10*3/uL — ABNORMAL HIGH (ref 4.0–10.5)
nRBC: 0 % (ref 0.0–0.2)

## 2023-12-22 LAB — URINALYSIS, ROUTINE W REFLEX MICROSCOPIC
Bilirubin Urine: NEGATIVE
Glucose, UA: NEGATIVE mg/dL
Hgb urine dipstick: NEGATIVE
Ketones, ur: NEGATIVE mg/dL
Leukocytes,Ua: NEGATIVE
Nitrite: POSITIVE — AB
Protein, ur: NEGATIVE mg/dL
Renal Epithelial: 14
Specific Gravity, Urine: 1.013 (ref 1.005–1.030)
pH: 6.5 (ref 5.0–8.0)

## 2023-12-22 LAB — BASIC METABOLIC PANEL WITH GFR
Anion gap: 11 (ref 5–15)
BUN: 18 mg/dL (ref 8–23)
CO2: 25 mmol/L (ref 22–32)
Calcium: 10 mg/dL (ref 8.9–10.3)
Chloride: 103 mmol/L (ref 98–111)
Creatinine, Ser: 0.86 mg/dL (ref 0.44–1.00)
GFR, Estimated: >60 mL/min (ref >60)
Glucose, Bld: 91 mg/dL (ref 70–99)
Potassium: 3.8 mmol/L (ref 3.5–5.1)
Sodium: 139 mmol/L (ref 135–145)

## 2023-12-22 MED ORDER — CEFPODOXIME PROXETIL 200 MG PO TABS: 200.0000 mg | ORAL_TABLET | Freq: Two times a day (BID) | ORAL | 0 refills | Status: DC

## 2023-12-22 MED ORDER — ACETAMINOPHEN 325 MG PO TABS: 650.0000 mg | ORAL_TABLET | Freq: Once | ORAL | Status: DC

## 2023-12-22 NOTE — ED Provider Notes (Signed)
 [place] EMERGENCY DEPARTMENT AT [facility] Provider Note   CSN: [id number] Arrival date & time: 12/22/23  1034     History Chief Complaint  Patient presents with   Lynn [name]    Lynn [name] is a 78 y.o. female patient coming from assisted living who presents to the emergency department today after a mechanical trip and fall.  This occurred just prior to arrival.  Patient states she was try to put on her close when she lost her balance and fell.  She did hit her head.  Unclear whether or not the patient lost consciousness.  She is anticoagulated with Plavix .  Per EMS, patient is alert and oriented x 4 and has been somewhat lethargic since the fall.  Patient complaining of bilateral knee pain. Tetanus is UTD.   Fall       Home Medications Prior to Admission medications   Medication Sig Start Date End Date Taking? Authorizing Provider  cefpodoxime (VANTIN) 200 MG tablet Take 1 tablet (200 mg total) by mouth 2 (two) times daily. 12/22/23  Yes Jamal Mays, Meriam Chojnowski M, PA-C  acetaminophen  (TYLENOL ) 500 MG tablet Take 1,000 mg by mouth in the morning and at bedtime.    [provider]  atorvastatin  (LIPITOR) 40 MG tablet Take 1 tablet (40 mg total) by mouth daily. 01/06/20   Lassen, Arlo C, PA-C  buPROPion (WELLBUTRIN XL) 150 MG 24 hr tablet Take 150 mg by mouth every morning. 07/29/21   [provider]  clopidogrel  (PLAVIX ) 75 MG tablet Take 1 tablet (75 mg total) by mouth daily. 01/06/20   Lassen, Arlo C, PA-C  cycloSPORINE (RESTASIS) 0.05 % ophthalmic emulsion 1 drop 2 (two) times daily.    [provider]  diclofenac  Sodium (VOLTAREN ) 1 % GEL Apply 2 g topically as needed.    [provider]  escitalopram  (LEXAPRO ) 10 MG tablet Take 10 mg by mouth daily.    [provider]  Eszopiclone  3 MG TABS Take 1 tablet (3 mg total) by mouth at bedtime. Take immediately before bedtime 12/03/23   Fargo, Amy E, NP  metoprolol  tartrate (LOPRESSOR ) 25  MG tablet Take 12.5 mg by mouth 2 (two) times daily.    [provider]  NON FORMULARY Diet: Regular, Chopped/Dysphagia 3   Special Instructions: Aspiration precautions. May have bacon per SLP. 12/01/19   [provider]  pantoprazole  (PROTONIX ) 20 MG tablet Take 20 mg by mouth daily.    [provider]  polyethylene glycol (MIRALAX  / GLYCOLAX ) 17 g packet Take 17 g by mouth 3 (three) times a week. Mon, wed, fri    [provider]  senna (SENOKOT) 8.6 MG TABS tablet Take 1 tablet by mouth daily as needed for mild constipation. 2 tabs, oral, once a day, give 2 tabs of Senna S for constipation. Hold for loose stools. Please document last stool if refused 3 times in a row    [provider]  traZODone (DESYREL) 50 MG tablet Take 50 mg by mouth at bedtime. 25 mg, oral, At bedtime, Trazodone 25 mg po at bedtime for insomnia    [provider]      Allergies    Chlorhexidine     Review of Systems   Review of Systems  All other systems reviewed and are negative.   Physical Exam Updated Vital Signs BP (!) 141/81   Pulse 63   Temp 97.7 F (36.5 C) (Oral)   Resp (!) 22   LMP 07/23/1998 (Approximate)  SpO2 95%  Physical Exam Vitals and nursing note reviewed.  Constitutional:      General: She is not in acute distress.    Appearance: Normal appearance.  HENT:     Head: Normocephalic and atraumatic.     Ears:     Comments: Small superficial laceration to the right auricle with underlying hematoma.  Does not appear to be suturable. Eyes:     General:        Right eye: No discharge.        Left eye: No discharge.  Cardiovascular:     Comments: Regular rate and rhythm.  S1/S2 are distinct without any evidence of murmur, rubs, or gallops.  Radial pulses are 2+ bilaterally.  Dorsalis pedis pulses are 2+ bilaterally.  No evidence of pedal edema. Pulmonary:     Comments: Clear to auscultation bilaterally.  Normal effort.  No respiratory  distress.  No evidence of wheezes, rales, or rhonchi heard throughout. Abdominal:     General: Abdomen is flat. Bowel sounds are normal. There is no distension.     Tenderness: There is no abdominal tenderness. There is no guarding or rebound.  Musculoskeletal:        General: Normal range of motion.     Cervical back: Neck supple.     Comments: Pelvis is stable and nontender to palpation.  No obvious deformity.  Skin:    General: Skin is warm and dry.     Findings: No rash.  Neurological:     General: No focal deficit present.     Mental Status: She is lethargic.     Comments: Response to questions appropriately but immediately falls asleep right afterward.  Psychiatric:        Mood and Affect: Mood normal.        Behavior: Behavior normal.     ED Results / Procedures / Treatments   Labs (all labs ordered are listed, but only abnormal results are displayed) Labs Reviewed  CBC WITH DIFFERENTIAL/PLATELET - Abnormal; Notable for the following components:      Result Value   WBC 10.7 (*)    MCV 102.5 (*)    Neutro Abs 8.7 (*)    All other components within normal limits  URINALYSIS, ROUTINE W REFLEX MICROSCOPIC - Abnormal; Notable for the following components:   Nitrite POSITIVE (*)    Bacteria, UA MANY (*)    All other components within normal limits  URINE CULTURE  BASIC METABOLIC PANEL WITH GFR    EKG EKG Interpretation Date/Time:  Sunday December 22 2023 10:42:05 EDT Ventricular Rate:  60 PR Interval:  198 QRS Duration:  96 QT Interval:  420 QTC Calculation: 420 R Axis:   -63  Text Interpretation: Sinus rhythm Left anterior fascicular block Abnormal R-wave progression, early transition Left ventricular hypertrophy Borderline T abnormalities, anterior leads since last tracing no significant change Confirmed by Hershel Los [phone]) on 12/22/2023 10:49:08 AM  Radiology DG Knee Complete 4 Views Left Result Date: 12/22/2023 CLINICAL DATA:  Fall with pain. EXAM: LEFT KNEE -  COMPLETE 4+ VIEW COMPARISON:  None Available. FINDINGS: No evidence for an acute fracture. No subluxation or dislocation. Advanced tricompartmental degenerative changes evident. No joint effusion. Meniscal calcification evident. IMPRESSION: Advanced tricompartmental degenerative changes without acute bony findings. Electronically Signed   By: Donnal Fusi M.D.   On: 12/22/2023 12:15   DG Knee Complete 4 Views Right Result Date: 12/22/2023 CLINICAL DATA:  Fall with knee pain. EXAM: RIGHT KNEE - COMPLETE 4+  VIEW COMPARISON:  None Available. FINDINGS: Bones are markedly demineralized. No gross fracture dislocation. Subtle nonweightbearing cortical disruption lateral femoral condyle may be related to degenerative changes although cortical avulsion injury not excluded. Meniscal calcification evident. Advanced degenerative changes in the patellofemoral compartment. Bony overgrowth along the lateral femoral condyle may reflect chronic posttraumatic deformity or osteo chondroma. No joint effusion. IMPRESSION: 1. Subtle nonweightbearing cortical disruption lateral femoral condyle may be related to degenerative changes although cortical avulsion injury not excluded. 2. Advanced degenerative changes in the patellofemoral compartment. 3. Bony overgrowth along the lateral femoral condyle may reflect chronic posttraumatic deformity or osteo chondroma. Electronically Signed   By: Donnal Fusi M.D.   On: 12/22/2023 11:19   CT Cervical Spine Wo Contrast Result Date: 12/22/2023 EXAM: CT CERVICAL SPINE WITHOUT CONTRAST 12/22/2023 10:52:00 AM TECHNIQUE: CT of the cervical was performed without the administration of intravenous contrast. Multiplanar reformatted images are provided for review. Automated exposure control, iterative reconstruction, and/or weight based adjustment of the mA/kV was utilized to reduce the radiation dose to as low as reasonably achievable. COMPARISON: MRI cervical spine dated 12/30/2014. CLINICAL HISTORY:  Neck trauma (Age >= 65y). Fall, hit right side of head. On plavix . Lethargic. FINDINGS: CERVICAL SPINE: BONES AND ALIGNMENT: Unchanged 4 mm anterolisthesis of C3 on C4 and C4 on C5. No traumatic malalignment. Acquired fusion of the left facet joint at C4-5. No acute fracture. DEGENERATIVE CHANGES: Unchanged multilevel cervical spondylosis, greatest at C6-7, where there is no more than mild spinal canal stenosis. SOFT TISSUES: No prevertebral soft tissue swelling. IMPRESSION: 1. No acute fracture or traumatic malalignment. 2. Unchanged 4 mm anterolisthesis of C3 on C4 and C4 on C5, and acquired fusion of the left facet joint at C4-5. 3. Unchanged multilevel cervical spondylosis, greatest at C6-7, with no more than mild spinal canal stenosis. Electronically signed by: Audra Blend MD 12/22/2023 11:08 AM EDT RP Workstation: [id number]   CT Head Wo Contrast Result Date: 12/22/2023 CLINICAL DATA:  Fall with right-sided head injury. EXAM: CT HEAD WITHOUT CONTRAST TECHNIQUE: Contiguous axial images were obtained from the base of the skull through the vertex without intravenous contrast. RADIATION DOSE REDUCTION: This exam was performed according to the departmental dose-optimization program which includes automated exposure control, adjustment of the mA and/or kV according to patient size and/or use of iterative reconstruction technique. COMPARISON:  11/13/2019 FINDINGS: Brain: There is no evidence for acute hemorrhage, hydrocephalus, mass lesion, or abnormal extra-axial fluid collection. No definite CT evidence for acute infarction. Diffuse loss of parenchymal volume is consistent with atrophy. Patchy low attenuation in the deep hemispheric and periventricular white matter is nonspecific, but likely reflects chronic microvascular ischemic demyelination. Infarcts in the bilateral basal ganglia again noted. Vascular: No hyperdense vessel or unexpected calcification. Skull: No evidence for fracture. No worrisome lytic or  sclerotic lesion. Sinuses/Orbits: The visualized paranasal sinuses and mastoid air cells are clear. Visualized portions of the globes and intraorbital fat are unremarkable. Other: None. IMPRESSION: 1. No acute intracranial abnormality. 2. Atrophy with chronic small vessel ischemic disease. 3. Old bilateral basal ganglia infarcts. Electronically Signed   By: Donnal Fusi M.D.   On: 12/22/2023 11:05    Procedures Procedures   Medications Ordered in ED Medications  acetaminophen  (TYLENOL ) tablet 650 mg (has no administration in time range)    ED Course/ Medical Decision Making/ A&P Clinical Course as of 12/22/23 1336  Sun Dec 22, 2023  1047 On initial evaluation, patient appears lethargic.  Given that the patient is normally alert  and oriented x 4 at baseline I am somewhat concerned given that she is anticoagulated with Plavix  and has a small right auricular laceration.  This does suggest that she did hit her head.  Patient does not recall this.  I ordered a CT head and CT C-spine.  I went ahead and called CT to get her over to the scanner sooner rather than later. [CF]  1329 I spoke with the sister at length at bedside and she states that her baseline mental status is questionable.  She will communicate however she does notice that she has been declining mentally for some time.  Patient usually ambulates with a walker from the chair to the bathroom.  She has not been able to walk on her own for some time. [CF]  1330 CBC with Differential(!) There is evidence of leukocytosis. [CF]  1330 Basic metabolic panel Negative. [CF]  1330 Urinalysis, Routine w reflex microscopic -Urine, Clean Catch(!) This appears to be infected which could indicate a decrease in her mental status. [CF]  1330 I got imaging over the knees bilaterally.  There is some extensive osteoarthritis but no clear evidence of infection. [CF]  1330 CT Head Wo Contrast I personally ordered and interpreted the study.  I do not see any  evidence of intracranial hemorrhage.  I do agree with the radiologist interpretation. [CF]  1330 CT Cervical Spine Wo Contrast Personally ordered and interpreted the study.  I do not see any evidence of spinal fracture.  I do agree with the radiologist interpretation. [CF]    Clinical Course User Index [CF] Darletta Ehrich, PA-C   {   Click here for ABCD2, HEART and other calculators  Medical Decision Making MONZERRAT WELLEN is a 78 y.o. female patient who presents to the emergency department today for further evaluation of mechanical fall.  She did come in altered and I ordered a CT head and cervical spine given that the patient did hit her head and is on Plavix  and according to EMS has a normal mental status.  She appeared initially lethargic but did answer questions appropriately.  CT scans were ultimately negative for any intracranial hemorrhage or cervical fracture.  She does have evidence of a UTI which I will treat.  Sister as highlighted in the ED course does mention that she has been declining cognitively for some time.  She does use a walker at baseline and again does not get around much.  Knee imaging is overall well.  Will plan to discharge her home.  Strict return precautions were discussed.  She is safe for discharge.   Amount and/or Complexity of Data Reviewed Labs: ordered. Decision-making details documented in ED Course. Radiology: ordered. Decision-making details documented in ED Course.  Risk OTC drugs. Prescription drug management.    Final Clinical Impression(s) / ED Diagnoses Final diagnoses:  Acute cystitis without hematuria  Fall, initial encounter  Acute pain of both knees    Rx / DC Orders ED Discharge Orders          Ordered    cefpodoxime (VANTIN) 200 MG tablet  2 times daily        12/22/23 1331              Angelyn Kennel [place], [place] 12/22/23 1336    Hershel Los, MD 12/22/23 1520

## 2023-12-22 NOTE — ED Notes (Signed)
 [employer] for transport

## 2023-12-22 NOTE — ED Triage Notes (Signed)
 BIB [facility]. From [facility]. Mechanical fall this morning. Struck R side of head. Denies LOC. Takes plavix . Lethargic. C/o knee pain.

## 2023-12-22 NOTE — ED Notes (Signed)
 Called [facility] to cancel transport per pt's family transporting

## 2023-12-22 NOTE — ED Notes (Signed)
 Patient transported to CT

## 2023-12-22 NOTE — ED Notes (Signed)
 Able to transfer with chair with walker. Shaky.

## 2023-12-22 NOTE — Discharge Instructions (Signed)
 She was diagnosed with a urinary tract infection today.  The rest of her imaging appears to be normal which is great news.  I will write her for some antibiotics to take at home.  I would like for you to follow-up with your primary care doctor for further evaluation.  You may return to the emergency department for any worsening symptoms.

## 2023-12-24 LAB — URINE CULTURE: Culture: >=100000 — AB

## 2023-12-25 ENCOUNTER — Telehealth (HOSPITAL_BASED_OUTPATIENT_CLINIC_OR_DEPARTMENT_OTHER): Payer: Self-pay | Admitting: *Deleted

## 2023-12-25 NOTE — Telephone Encounter (Signed)
 Post ED Visit - Positive Culture Follow-up  Culture report reviewed by antimicrobial stewardship pharmacist: [facility] [x]  Barbra Boone, [place].D. []  Skeet Duke, Pharm.D., BCPS AQ-ID []  Leslee Rase, Pharm.D., BCPS []  Garland Junk, Pharm.D., BCPS []  [place], [address].D., BCPS, AAHIVP []  Alcide Aly, Pharm.D., BCPS, AAHIVP []  Jerri Morale, PharmD, BCPS []  Graham Laws, PharmD, BCPS []  Cleda Curly, PharmD, BCPS []  Tamar Fairly, PharmD []  Ballard Levels, PharmD, BCPS []  Ollen Beverage, PharmD  [facility] []  Arlyne Bering, PharmD []  Sherryle Don, PharmD []  Van Gelinas, PharmD []  Delila Felty, Rph []  [place]) Cleora Daft, PharmD []  Augustina Block, PharmD []  Arie Kurtz, PharmD []  Sharlyn Deaner, PharmD []  Agnes Hose, PharmD []  Kendall Pauls, PharmD []  Gladstone Lamer, PharmD []  Armanda Bern, PharmD []  Tera Fellows, PharmD   Positive urine culture Treated with cefpodoxime, organism sensitive to the same and no further patient follow-up is required at this time.  Lovell Rubenstein Sharion Davidson 12/25/2023, 11:23 AM

## 2024-01-28 ENCOUNTER — Encounter: Payer: Self-pay | Admitting: Orthopedic Surgery

## 2024-01-28 NOTE — Progress Notes (Signed)
 This encounter was created in error - please disregard.

## 2024-01-28 NOTE — Progress Notes (Signed)
 [facility] Room Number: 633-A Place of Service:  [phone]) Provider:  Greig Cluster, NP  PCP: Charlanne Fredia CROME, MD  Patient Care Team: Charlanne Fredia CROME, MD as PCP - General (Internal Medicine) Kit Rush, MD as Consulting Physician (Orthopedic Surgery) Georjean Darice HERO, MD as Consulting Physician (Neurology)  Extended Emergency Contact Information Primary Emergency Contact: [facility] Address: [address]          [place], [place] [address] [place] Mobile Phone: [phone] Relation: Sister Interpreter needed? No Secondary Emergency Contact: Armstrong,Jim  [place] Phone: [phone] Relation: Brother  Code Status:  DNR Goals of care: Advanced Directive information    01/28/2024   11:56 AM  Advanced Directives  Does Patient Have a Medical Advance Directive? Yes  Type of Estate agent of [place];Out of facility DNR (pink MOST or yellow form)  Does patient want to make changes to medical advance directive? No - Patient declined  Copy of Healthcare Power of Attorney in Chart? Yes - validated most recent copy scanned in chart (See row information)     Chief Complaint  Patient presents with   Hemorrhoids    HPI:  Pt is a 78 y.o. female seen today for an acute visit for    Past Medical History:  Diagnosis Date   Anxiety    Aortic atherosclerosis (HCC)    Arthritis    Complication of anesthesia    per pt, slow to wake up past sedation.   CVA (cerebral vascular accident) (HCC) 11/10/2019   Depression    Diverticulosis    Hiatal hernia    Hyperplastic colon polyp    Hypertension    Neuropathy    Osteoarthritis    Osteoporosis    Renal mass    Stomach upset    has a senstive stomach   Throat clearing    has a cough at times   Urinary incontinence    Vitamin D  deficiency    Past Surgical History:  Procedure Laterality Date   COLONOSCOPY     FACIAL  COSMETIC SURGERY     over 15 years ago   FINGER TENDON REPAIR     middle rt hand-   HAMMER TOE SURGERY  12/27/2011   Procedure: HAMMER TOE CORRECTION;  Surgeon: Rush Kit, MD;  Location: [facility];  Service: Orthopedics;  Laterality: Left;  2-4th    Allergies  Allergen Reactions   Chlorhexidine      Allergies as of 01/28/2024       Reactions   Chlorhexidine          Medication List        Accurate as of January 28, 2024 11:59 AM. If you have any questions, ask your nurse or doctor.          acetaminophen  500 MG tablet Commonly known as: TYLENOL  Take 1,000 mg by mouth in the morning and at bedtime.   atorvastatin  40 MG tablet Commonly known as: LIPITOR Take 1 tablet (40 mg total) by mouth daily.   buPROPion 150 MG 24 hr tablet Commonly known as: WELLBUTRIN XL Take 150 mg by mouth every morning.   cefpodoxime  200 MG tablet Commonly known as: VANTIN  Take 1 tablet (200 mg total) by mouth 2 (two) times daily.   clopidogrel  75 MG tablet Commonly known as: PLAVIX  Take 1 tablet (75 mg total) by mouth daily.   cycloSPORINE 0.05 % ophthalmic emulsion Commonly known as: RESTASIS  1 drop 2 (two) times daily.   escitalopram  10 MG tablet Commonly known as: LEXAPRO  Take 10 mg by mouth daily.   Eszopiclone  3 MG Tabs Take 1 tablet (3 mg total) by mouth at bedtime. Take immediately before bedtime   metoprolol  tartrate 25 MG tablet Commonly known as: LOPRESSOR  Take 12.5 mg by mouth 2 (two) times daily.   NON FORMULARY Diet: Regular, Chopped/Dysphagia 3   Special Instructions: Aspiration precautions. May have bacon per SLP.   pantoprazole  20 MG tablet Commonly known as: PROTONIX  Take 20 mg by mouth daily.   polyethylene glycol 17 g packet Commonly known as: MIRALAX  / GLYCOLAX  Take 17 g by mouth 3 (three) times a week. Mon, wed, fri   senna 8.6 MG Tabs tablet Commonly known as: SENOKOT Take 1 tablet by mouth daily as needed for mild constipation. 2  tabs, oral, once a day, give 2 tabs of Senna S for constipation. Hold for loose stools. Please document last stool if refused 3 times in a row   traZODone 50 MG tablet Commonly known as: DESYREL Take 50 mg by mouth at bedtime. 25 mg, oral, At bedtime, Trazodone 25 mg po at bedtime for insomnia   Voltaren  1 % Gel Generic drug: diclofenac  Sodium Apply 2 g topically 2 (two) times daily.        Review of Systems  Immunization History  Administered Date(s) Administered   Fluad Quad(high Dose 65+) 04/27/2019, 04/27/2022, 05/14/2023   Influenza, High Dose Seasonal PF 05/03/2017, 05/19/2018   Influenza,inj,Quad PF,6+ Mos 04/29/2013, 05/18/2014, 05/02/2015, 04/17/2016   Influenza-Unspecified 05/13/2020, 04/26/2021   Moderna Covid-19 Vaccine Bivalent Booster 62yrs & up 11/15/2022, 05/14/2023   Moderna SARS-COV2 Booster Vaccination 12/25/2020   Moderna Sars-Covid-2 Vaccination 05/28/2022   PFIZER(Purple Top)SARS-COV-2 Vaccination 08/27/2019, 09/21/2019, 06/13/2020   Pfizer Covid-19 Vaccine Bivalent Booster 5y-11y 05/03/2021   Pneumococcal Conjugate-13 05/11/2013   Pneumococcal Polysaccharide-23 05/02/2015   Tdap 01/23/2021   Zoster Recombinant(Shingrix) 01/20/2021, 03/24/2021   Zoster, Unspecified 01/20/2021   Pertinent  Health Maintenance Due  Topic Date Due   INFLUENZA VACCINE  02/21/2024   DEXA SCAN  Discontinued   Colonoscopy  Discontinued      11/12/2022    2:58 PM 05/29/2023    2:56 PM 07/29/2023    2:17 PM 09/25/2023    3:13 PM 12/02/2023    1:37 PM  Fall Risk  Falls in the past year? 0 0 0 0 0  Was there an injury with Fall? 0 0 0 0 0  Fall Risk Category Calculator 0 0 0 0 0  Patient at Risk for Falls Due to History of fall(s);Impaired balance/gait;Impaired mobility No Fall Risks   Impaired balance/gait;Impaired mobility  Fall risk Follow up Falls evaluation completed Falls evaluation completed  Falls evaluation completed Falls evaluation completed   Functional Status  Survey:    Vitals:   01/28/24 1154  BP: (!) 143/76  Pulse: 60  Resp: 16  Temp: (!) 97.4 F (36.3 C)  SpO2: 98%  Weight: 115 lb 12.8 oz (52.5 kg)  Height: 5' 2 (1.575 m)   Body mass index is 21.18 kg/m. Physical Exam  Labs reviewed: Recent Labs    01/28/23 1452 07/30/23 0000 12/22/23 1125  NA 137 137 139  K 3.6 4.6 3.8  CL 106 103 103  CO2 22 26* 25  GLUCOSE 112*  --  91  BUN 18 16 18   CREATININE 0.89 0.8 0.86  CALCIUM  9.6 9.7 10.0   Recent Labs    01/28/23 1452  07/30/23 0000  AST 17 22  ALT 21 34  ALKPHOS 79 74  BILITOT 0.5  --   PROT 6.3*  --   ALBUMIN 3.6 3.8   Recent Labs    01/28/23 1452 07/30/23 0000 12/22/23 1125  WBC 12.4* 6.8 10.7*  NEUTROABS 10.3*  --  8.7*  HGB 13.7 13.3 13.0  HCT 41.7 39 40.3  MCV 96.5  --  102.5*  PLT 326 258 246   Lab Results  Component Value Date   TSH 1.445 10/17/2022   Lab Results  Component Value Date   HGBA1C 5.1 11/11/2019   Lab Results  Component Value Date   CHOL 151 11/28/2021   HDL 65 11/28/2021   LDLCALC 71 11/28/2021   LDLDIRECT 116.0 04/27/2019   TRIG 77 11/28/2021   CHOLHDL 3.0 11/11/2019    Significant Diagnostic Results in last 30 days:  No results found.  Assessment/Plan There are no diagnoses linked to this encounter.   Family/ staff Communication:   Labs/tests ordered:

## 2024-02-03 ENCOUNTER — Encounter: Payer: Self-pay | Admitting: Adult Health

## 2024-02-03 ENCOUNTER — Non-Acute Institutional Stay: Admitting: Adult Health

## 2024-02-03 VITALS — BP 120/74 | HR 60 | Temp 98.0°F | Ht 62.0 in | Wt 122.0 lb

## 2024-02-03 DIAGNOSIS — F5101 Primary insomnia: Secondary | ICD-10-CM

## 2024-02-03 DIAGNOSIS — M25511 Pain in right shoulder: Secondary | ICD-10-CM

## 2024-02-03 DIAGNOSIS — I69354 Hemiplegia and hemiparesis following cerebral infarction affecting left non-dominant side: Secondary | ICD-10-CM

## 2024-02-03 DIAGNOSIS — I10 Essential (primary) hypertension: Secondary | ICD-10-CM | POA: Diagnosis not present

## 2024-02-03 DIAGNOSIS — D7589 Other specified diseases of blood and blood-forming organs: Secondary | ICD-10-CM

## 2024-02-03 DIAGNOSIS — G8929 Other chronic pain: Secondary | ICD-10-CM

## 2024-02-03 DIAGNOSIS — E7800 Pure hypercholesterolemia, unspecified: Secondary | ICD-10-CM

## 2024-02-03 DIAGNOSIS — D500 Iron deficiency anemia secondary to blood loss (chronic): Secondary | ICD-10-CM

## 2024-02-03 MED ORDER — ESZOPICLONE 3 MG PO TABS: 3.0000 mg | ORAL_TABLET | Freq: Every day | ORAL | 3 refills | Status: DC

## 2024-02-03 NOTE — Progress Notes (Signed)
 Location:  [facility]   POS: clinic  Provider:  Bari America, ANP [facility] [phone]   Code Status: DNR most form  Goals of Care:     01/28/2024   11:56 AM  Advanced Directives  Does Patient Have a Medical Advance Directive? Yes  Type of Estate agent of [place];Out of facility DNR (pink MOST or yellow form)  Does patient want to make changes to medical advance directive? No - Patient declined  Copy of Healthcare Power of Attorney in Chart? Yes - validated most recent copy scanned in chart (See row information)     Chief Complaint  Patient presents with   Medical Management of Chronic Issues    3 Month follow up. Still having trouble sleeping even with the medication.    HPI: Patient is a 78 y.o. female seen today for medical management of chronic diseases.    [place], [place]  PMH CVA with left sided weakness 10/2019, also HTN, MCI, hearing loss, osteoporosis, OA, Raynauds, depression, HLD, constipation, neuropathy, anemia.    Using lunesta  for sleep. Sleeps about 6 hrs per night. Expresses that the med works but wears off at times.   Continues with periods of anxiety, currently on Wellbutrin and lexapro   Reports continued right shoulder pain. She is using tylenol  and voltaren . Seen by ortho and diagnosed with chronic rotator cuff tear arthropathy.  Has had an injection and does not feel it helped.  Reports pain is unchanged but she is not interested in further management.   MMSE 29/30 12/28/22  HTN on lopressor  BP 120/74 BUN/CR 18/0.86  LDL 62 11/2023 on Lipitor   Anemia: has seen Dr Gatha and received iron infusion.  H/H 13/40.3 MCV 102.5  Hiatal hernia and gerd on protonix   Miralax  for constipation working.   Declines bone density Mammogram aged out Past Medical History:  Diagnosis Date   Anxiety    Aortic atherosclerosis (HCC)    Arthritis    Complication of anesthesia    per pt, slow to  wake up past sedation.   CVA (cerebral vascular accident) (HCC) 11/10/2019   Depression    Diverticulosis    Hiatal hernia    Hyperplastic colon polyp    Hypertension    Neuropathy    Osteoarthritis    Osteoporosis    Renal mass    Stomach upset    has a senstive stomach   Throat clearing    has a cough at times   Urinary incontinence    Vitamin D  deficiency     Past Surgical History:  Procedure Laterality Date   COLONOSCOPY     FACIAL COSMETIC SURGERY     over 15 years ago   FINGER TENDON REPAIR     middle rt hand-   HAMMER TOE SURGERY  12/27/2011   Procedure: HAMMER TOE CORRECTION;  Surgeon: Norleen Armor, MD;  Location: [facility];  Service: Orthopedics;  Laterality: Left;  2-4th    Allergies  Allergen Reactions   Chlorhexidine      Outpatient Encounter Medications as of 02/03/2024  Medication Sig   acetaminophen  (TYLENOL ) 500 MG tablet Take 1,000 mg by mouth in the morning and at bedtime.   atorvastatin  (LIPITOR) 40 MG tablet Take 1 tablet (40 mg total) by mouth daily.   buPROPion (WELLBUTRIN XL) 150 MG 24 hr tablet Take 150 mg by mouth every morning.   clopidogrel  (PLAVIX ) 75 MG tablet Take 1 tablet (75 mg total) by  mouth daily.   cycloSPORINE (RESTASIS) 0.05 % ophthalmic emulsion 1 drop 2 (two) times daily.   diclofenac  Sodium (VOLTAREN ) 1 % GEL Apply 2 g topically 2 (two) times daily.   escitalopram  (LEXAPRO ) 10 MG tablet Take 10 mg by mouth daily.   Eszopiclone  3 MG TABS Take 1 tablet (3 mg total) by mouth at bedtime. Take immediately before bedtime   metoprolol  tartrate (LOPRESSOR ) 25 MG tablet Take 12.5 mg by mouth 2 (two) times daily.   NON FORMULARY Diet: Regular, Chopped/Dysphagia 3   Special Instructions: Aspiration precautions. May have bacon per SLP.   pantoprazole  (PROTONIX ) 20 MG tablet Take 20 mg by mouth daily.   polyethylene glycol (MIRALAX  / GLYCOLAX ) 17 g packet Take 17 g by mouth 3 (three) times a week. Mon, wed, fri   senna  (SENOKOT) 8.6 MG TABS tablet Take 1 tablet by mouth daily as needed for mild constipation. 2 tabs, oral, once a day, give 2 tabs of Senna S for constipation. Hold for loose stools. Please document last stool if refused 3 times in a row   traZODone (DESYREL) 50 MG tablet Take 50 mg by mouth at bedtime. 25 mg, oral, At bedtime, Trazodone 25 mg po at bedtime for insomnia   cefpodoxime  (VANTIN ) 200 MG tablet Take 1 tablet (200 mg total) by mouth 2 (two) times daily. (Patient not taking: Reported on 01/28/2024)   No facility-administered encounter medications on file as of 02/03/2024.    Review of Systems:  Review of Systems  Constitutional:  Negative for activity change, appetite change, chills, diaphoresis, fatigue, fever and unexpected weight change.  HENT:  Negative for congestion.   Respiratory:  Negative for cough, shortness of breath and wheezing.   Cardiovascular:  Negative for chest pain, palpitations and leg swelling.  Gastrointestinal:  Negative for abdominal distention, abdominal pain, constipation and diarrhea.  Genitourinary:  Negative for difficulty urinating and dysuria.  Musculoskeletal:  Positive for arthralgias and gait problem. Negative for back pain, joint swelling and myalgias.       Right shoulder pain  Skin:  Negative for wound.  Neurological:  Negative for dizziness, tremors, seizures, syncope, facial asymmetry, speech difficulty, weakness, light-headedness, numbness and headaches.  Psychiatric/Behavioral:  Positive for sleep disturbance. Negative for agitation, behavioral problems and confusion. The patient is nervous/anxious.     Health Maintenance  Topic Date Due   COVID-19 Vaccine (9 - 2024-25 season) 05/13/2024 (Originally 07/09/2023)   INFLUENZA VACCINE  02/21/2024   Medicare Annual Wellness (AWV)  12/01/2024   DTaP/Tdap/Td (2 - Td or Tdap) 01/24/2031   Pneumococcal Vaccine: 50+ Years  Completed   Hepatitis C Screening  Completed   Zoster Vaccines- Shingrix   Completed   Hepatitis B Vaccines  Aged Out   HPV VACCINES  Aged Out   Meningococcal B Vaccine  Aged Out   DEXA SCAN  Discontinued   Colonoscopy  Discontinued    Physical Exam: Vitals:   02/03/24 1357  BP: 120/74  Pulse: 60  Temp: 98 F (36.7 C)  SpO2: 95%  Weight: 122 lb (55.3 kg)  Height: 5' 2 (1.575 m)    Body mass index is 22.31 kg/m. Wt Readings from Last 3 Encounters:  02/03/24 122 lb (55.3 kg)  01/28/24 115 lb 12.8 oz (52.5 kg)  12/02/23 116 lb (52.6 kg)    Physical Exam Constitutional:      General: She is not in acute distress.    Appearance: She is not diaphoretic.  HENT:  Head: Normocephalic and atraumatic.     Right Ear: Ear canal normal. There is impacted cerumen.     Left Ear: Tympanic membrane and ear canal normal. There is no impacted cerumen.     Nose: Nose normal.     Mouth/Throat:     Mouth: Mucous membranes are moist.     Pharynx: Oropharynx is clear.  Eyes:     Conjunctiva/sclera: Conjunctivae normal.     Pupils: Pupils are equal, round, and reactive to light.  Neck:     Vascular: No JVD.  Cardiovascular:     Rate and Rhythm: Normal rate and regular rhythm.     Heart sounds: No murmur heard. Pulmonary:     Effort: Pulmonary effort is normal. No respiratory distress.     Breath sounds: Normal breath sounds. No wheezing.  Abdominal:     General: Bowel sounds are normal. There is no distension.     Palpations: Abdomen is soft.     Tenderness: There is no abdominal tenderness.  Musculoskeletal:     Right shoulder: No swelling, deformity, effusion, laceration, tenderness or bony tenderness. Decreased range of motion. Decreased strength.     Left shoulder: Normal.     Right lower leg: No edema.     Left lower leg: No edema.     Comments: Pain with ROM of right shoulder   Skin:    General: Skin is warm and dry.  Neurological:     Mental Status: She is alert and oriented to person, place, and time. Mental status is at baseline.      Comments: Right sided weakness  Psychiatric:        Mood and Affect: Mood normal.     Labs reviewed: Basic Metabolic Panel: Recent Labs    07/30/23 0000 12/22/23 1125  NA 137 139  K 4.6 3.8  CL 103 103  CO2 26* 25  GLUCOSE  --  91  BUN 16 18  CREATININE 0.8 0.86  CALCIUM  9.7 10.0   Liver Function Tests: Recent Labs    07/30/23 0000  AST 22  ALT 34  ALKPHOS 74  ALBUMIN 3.8   No results for input(s): LIPASE, AMYLASE in the last 8760 hours. No results for input(s): AMMONIA in the last 8760 hours. CBC: Recent Labs    07/30/23 0000 12/22/23 1125  WBC 6.8 10.7*  NEUTROABS  --  8.7*  HGB 13.3 13.0  HCT 39 40.3  MCV  --  102.5*  PLT 258 246   Lipid Panel: Recent Labs    11/28/23 0000  CHOL 148  HDL 64  LDLCALC 62  TRIG 108   Lab Results  Component Value Date   HGBA1C 5.1 11/11/2019    Procedures since last visit: No results found.  Assessment/Plan  1. Chronic right shoulder pain (Primary) Chronic  Using voltaren  and tylenol  If worsening consider referral   2. Primary insomnia Using lunesta  with benefit.   3. Pure hypercholesterolemia Continue Lipitor   4. Primary hypertension Controlled   5. Iron deficiency anemia, unspecified iron deficiency anemia type H/H stable MCV trending up See below  6. 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   7. Macrocytosis Check B12 and folate  Labs/tests ordered:  * No order type specified * Next appt:  F/U 3 months with Dr Charlanne

## 2024-02-04 LAB — VITAMIN B12: Vitamin B-12: 579

## 2024-05-05 ENCOUNTER — Encounter: Payer: Self-pay | Admitting: Internal Medicine

## 2024-05-05 ENCOUNTER — Non-Acute Institutional Stay: Admitting: Internal Medicine

## 2024-05-05 VITALS — BP 118/78 | HR 60 | Temp 98.4°F | Ht 62.0 in | Wt 122.0 lb

## 2024-05-05 DIAGNOSIS — K219 Gastro-esophageal reflux disease without esophagitis: Secondary | ICD-10-CM

## 2024-05-05 DIAGNOSIS — I10 Essential (primary) hypertension: Secondary | ICD-10-CM

## 2024-05-05 DIAGNOSIS — E7800 Pure hypercholesterolemia, unspecified: Secondary | ICD-10-CM | POA: Diagnosis not present

## 2024-05-05 DIAGNOSIS — I69354 Hemiplegia and hemiparesis following cerebral infarction affecting left non-dominant side: Secondary | ICD-10-CM

## 2024-05-05 DIAGNOSIS — H6122 Impacted cerumen, left ear: Secondary | ICD-10-CM

## 2024-05-05 DIAGNOSIS — K5901 Slow transit constipation: Secondary | ICD-10-CM

## 2024-05-05 DIAGNOSIS — D500 Iron deficiency anemia secondary to blood loss (chronic): Secondary | ICD-10-CM | POA: Diagnosis not present

## 2024-05-05 DIAGNOSIS — F5101 Primary insomnia: Secondary | ICD-10-CM

## 2024-05-05 DIAGNOSIS — F418 Other specified anxiety disorders: Secondary | ICD-10-CM

## 2024-05-05 DIAGNOSIS — S81812A Laceration without foreign body, left lower leg, initial encounter: Secondary | ICD-10-CM

## 2024-05-05 NOTE — Progress Notes (Signed)
 Location:  [facility] [employer] features editor of Service:  Clinic (12)  Provider:   Code Status: DNR Goals of Care:     01/28/2024   11:56 AM  Advanced Directives  Does Patient Have a Medical Advance Directive? Yes  Type of Estate agent of [place];Out of facility DNR (pink MOST or yellow form)  Does patient want to make changes to medical advance directive? No - Patient declined  Copy of Healthcare Power of Attorney in Chart? Yes - validated most recent copy scanned in chart (See row information)     Chief Complaint  Patient presents with   Follow-up    3 month follow up Patient states she has a hard time staying asleep.    HPI: Patient is a 78 y.o. female seen today for medical management of chronic diseases.    [place], [place]   Patient has a history of hypertension, hyperlipidemia, depression and anxiety, osteoporosis   bilateral CVAs left greater than right in 2021 Followed By Dysphagia , Dysarthria,  Iron Def Anemia now resolved with Iron infusion  Discussed the use of AI scribe software for clinical note transcription with the patient, who gave verbal consent to proceed.  History of Present Illness   Lynn [name] is a 78 year old female who presents with a toe injury and constipation.  She injured her fourth toe on the left foot last night by hitting it against a closet door. There was significant bleeding initially, which has now stopped, and a dressing is in place.  She experiences constipation despite using Miralax  three times a week, but she does not consume the full eight ounces of the mixture. She strains during bowel movements, resulting in hemorrhoids.  She experiences frequent waking and restlessness at night and takes trazodone and Lunesta  for sleep. She performs most daily activities independently, such as getting up, dressing, and using the bathroom, though it takes time. She receives  assistance from caregivers for showering twice a week. She has no issues with urination and does not use hearing aids.       Per Nurses Patient is not able to maintain a good Hygiene but on reminders get frustrated easily  Past Medical History:  Diagnosis Date   Anxiety    Aortic atherosclerosis    Arthritis    Complication of anesthesia    per pt, slow to wake up past sedation.   CVA (cerebral vascular accident) (HCC) 11/10/2019   Depression    Diverticulosis    Hiatal hernia    Hyperplastic colon polyp    Hypertension    Neuropathy    Osteoarthritis    Osteoporosis    Renal mass    Stomach upset    has a senstive stomach   Throat clearing    has a cough at times   Urinary incontinence    Vitamin D  deficiency     Past Surgical History:  Procedure Laterality Date   COLONOSCOPY     FACIAL COSMETIC SURGERY     over 15 years ago   FINGER TENDON REPAIR     middle rt hand-   HAMMER TOE SURGERY  12/27/2011   Procedure: HAMMER TOE CORRECTION;  Surgeon: Norleen Armor, MD;  Location: [facility];  Service: Orthopedics;  Laterality: Left;  2-4th    Allergies  Allergen Reactions   Chlorhexidine      Outpatient Encounter Medications as of 05/05/2024  Medication Sig   acetaminophen  (TYLENOL )  500 MG tablet Take 1,000 mg by mouth in the morning and at bedtime.   atorvastatin  (LIPITOR) 40 MG tablet Take 1 tablet (40 mg total) by mouth daily.   buPROPion (WELLBUTRIN XL) 150 MG 24 hr tablet Take 150 mg by mouth every morning.   clopidogrel  (PLAVIX ) 75 MG tablet Take 1 tablet (75 mg total) by mouth daily.   cycloSPORINE (RESTASIS) 0.05 % ophthalmic emulsion 1 drop 2 (two) times daily.   diclofenac  Sodium (VOLTAREN ) 1 % GEL Apply 2 g topically 2 (two) times daily.   escitalopram  (LEXAPRO ) 10 MG tablet Take 10 mg by mouth daily.   Eszopiclone  3 MG TABS Take 1 tablet (3 mg total) by mouth at bedtime. Take immediately before bedtime   metoprolol  tartrate (LOPRESSOR ) 25 MG  tablet Take 12.5 mg by mouth 2 (two) times daily.   NON FORMULARY Diet: Regular, Chopped/Dysphagia 3   Special Instructions: Aspiration precautions. May have bacon per SLP.   pantoprazole  (PROTONIX ) 20 MG tablet Take 20 mg by mouth daily.   polyethylene glycol (MIRALAX  / GLYCOLAX ) 17 g packet Take 17 g by mouth as needed. Mon, wed, fri   senna (SENOKOT) 8.6 MG TABS tablet Take 2 tablets by mouth. Give 2 tablet by mouth in the evening every other day for constipation   traZODone (DESYREL) 50 MG tablet Take 50 mg by mouth at bedtime. 25 mg, oral, At bedtime, Trazodone 25 mg po at bedtime for insomnia   No facility-administered encounter medications on file as of 05/05/2024.    Review of Systems:  Review of Systems  Constitutional:  Negative for activity change and appetite change.  HENT: Negative.    Respiratory:  Negative for cough and shortness of breath.   Cardiovascular:  Negative for leg swelling.  Gastrointestinal:  Positive for constipation.  Genitourinary: Negative.   Musculoskeletal:  Positive for gait problem. Negative for arthralgias and myalgias.  Skin: Negative.   Neurological:  Negative for dizziness and weakness.  Psychiatric/Behavioral:  Negative for confusion, dysphoric mood and sleep disturbance.     Health Maintenance  Topic Date Due   COVID-19 Vaccine (10 - 2025-26 season) 10/19/2024   Medicare Annual Wellness (AWV)  12/01/2024   DTaP/Tdap/Td (2 - Td or Tdap) 01/24/2031   Pneumococcal Vaccine: 50+ Years  Completed   Influenza Vaccine  Completed   Hepatitis C Screening  Completed   Zoster Vaccines- Shingrix  Completed   Meningococcal B Vaccine  Aged Out   Mammogram  Discontinued   DEXA SCAN  Discontinued   Colonoscopy  Discontinued    Physical Exam: Vitals:   05/05/24 0946  BP: 118/78  Pulse: 60  Temp: 98.4 F (36.9 C)  SpO2: 96%  Weight: 122 lb (55.3 kg)  Height: 5' 2 (1.575 m)   Body mass index is 22.31 kg/m. Physical Exam Vitals reviewed.   Constitutional:      Appearance: Normal appearance.  HENT:     Head: Normocephalic.     Ears:     Comments: Wax in the Left ear    Nose: Nose normal.     Mouth/Throat:     Mouth: Mucous membranes are moist.     Pharynx: Oropharynx is clear.  Eyes:     Pupils: Pupils are equal, round, and reactive to light.  Cardiovascular:     Rate and Rhythm: Normal rate and regular rhythm.     Pulses: Normal pulses.     Heart sounds: Normal heart sounds. No murmur heard. Pulmonary:  Effort: Pulmonary effort is normal.     Breath sounds: Normal breath sounds.  Abdominal:     General: Abdomen is flat. Bowel sounds are normal.     Palpations: Abdomen is soft.  Musculoskeletal:        General: No swelling.     Cervical back: Neck supple.  Skin:    General: Skin is warm.  Neurological:     Mental Status: She is alert and oriented to person, place, and time.     Comments: Right Hemiplegia  Psychiatric:        Mood and Affect: Mood normal.        Thought Content: Thought content normal.     Labs reviewed: Basic Metabolic Panel: Recent Labs    07/30/23 0000 12/22/23 1125  NA 137 139  K 4.6 3.8  CL 103 103  CO2 26* 25  GLUCOSE  --  91  BUN 16 18  CREATININE 0.8 0.86  CALCIUM  9.7 10.0   Liver Function Tests: Recent Labs    07/30/23 0000  AST 22  ALT 34  ALKPHOS 74  ALBUMIN 3.8   No results for input(s): LIPASE, AMYLASE in the last 8760 hours. No results for input(s): AMMONIA in the last 8760 hours. CBC: Recent Labs    07/30/23 0000 12/22/23 1125  WBC 6.8 10.7*  NEUTROABS  --  8.7*  HGB 13.3 13.0  HCT 39 40.3  MCV  --  102.5*  PLT 258 246   Lipid Panel: Recent Labs    11/28/23 0000  CHOL 148  HDL 64  LDLCALC 62  TRIG 108   Lab Results  Component Value Date   HGBA1C 5.1 11/11/2019    Procedures since last visit: No results found.  Assessment/Plan 1. Primary insomnia (Primary) On Trazodone and Eszopiclone  Would not change the dose  2.  Pure hypercholesterolemia On Statin LDL 62 in 11/2023  3. Primary hypertension Metoprolol   4. Iron deficiency anemia due to chronic blood loss HGB is good Has Gotten Iron Infusions before Repeat CBC 5. 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 Depression with anxiety Sees DR pLovsky On Lexapro  and Wellbutrin  7. Gastroesophageal reflux disease without esophagitis Protonix   8. Skin tear of Left foot  fourth Toe Looks good Band Aid replaced  9. Slow transit constipation Increase Miralax  to 5/week Already on Senna  10. Excessive wax in left ear Ear wah Written    Labs/tests ordered:  CBC,CMP TSH before Next visit Next appt:  08/10/2024

## 2024-05-14 ENCOUNTER — Encounter: Payer: Self-pay | Admitting: Adult Health

## 2024-05-14 ENCOUNTER — Non-Acute Institutional Stay: Payer: Self-pay | Admitting: Adult Health

## 2024-05-14 DIAGNOSIS — M24541 Contracture, right hand: Secondary | ICD-10-CM | POA: Diagnosis not present

## 2024-05-14 NOTE — Progress Notes (Unsigned)
 Location:  Medical illustrator of Service:  ALF (13) Provider:   Bari America, ANP [facility] [phone]   Charlanne Fredia CROME, MD  Patient Care Team: Charlanne Fredia CROME, MD as PCP - General (Internal Medicine) Kit Rush, MD as Consulting Physician (Orthopedic Surgery) Georjean Darice HERO, MD as Consulting Physician (Neurology)  Extended Emergency Contact Information Primary Emergency Contact: [facility] Address: [address]          [place], [place] [address] [place] Mobile Phone: [phone] Relation: Sister Interpreter needed? No Secondary Emergency Contact: Tatham,Jim  [place] Phone: [phone] Relation: Brother  Code Status:  *** Goals of care: Advanced Directive information    01/28/2024   11:56 AM  Advanced Directives  Does Patient Have a Medical Advance Directive? Yes  Type of Estate agent of [place];Out of facility DNR (pink MOST or yellow form)  Does patient want to make changes to medical advance directive? No - Patient declined  Copy of Healthcare Power of Attorney in Chart? Yes - validated most recent copy scanned in chart (See row information)     Chief Complaint  Patient presents with   Acute Visit    Contracture of the right hand.     HPI:  Pt is a 78 y.o. female seen today for an acute visit for contracture of the right hand.   She has a hx of CVA bilateral basal ganglia infarction, L>R  in 2021 and has developed a right hand contracture.   Denies any pain or swelling.  She uses her right hand to operate her PWC.  Hx of right hand tendon repair per record  She is not wearing a brace consistently  Staff questioning if a referral would be appropriate for treatment.   Past Medical History:  Diagnosis Date   Anxiety    Aortic atherosclerosis    Arthritis    Complication of anesthesia    per pt, slow to wake up past sedation.   CVA (cerebral  vascular accident) (HCC) 11/10/2019   Depression    Diverticulosis    Hiatal hernia    Hyperplastic colon polyp    Hypertension    Neuropathy    Osteoarthritis    Osteoporosis    Renal mass    Stomach upset    has a senstive stomach   Throat clearing    has a cough at times   Urinary incontinence    Vitamin D  deficiency    Past Surgical History:  Procedure Laterality Date   COLONOSCOPY     FACIAL COSMETIC SURGERY     over 15 years ago   FINGER TENDON REPAIR     middle rt hand-   HAMMER TOE SURGERY  12/27/2011   Procedure: HAMMER TOE CORRECTION;  Surgeon: Rush Kit, MD;  Location: [facility];  Service: Orthopedics;  Laterality: Left;  2-4th    Allergies  Allergen Reactions   Chlorhexidine      Outpatient Encounter Medications as of 05/14/2024  Medication Sig   acetaminophen  (TYLENOL ) 500 MG tablet Take 1,000 mg by mouth in the morning and at bedtime.   atorvastatin  (LIPITOR) 40 MG tablet Take 1 tablet (40 mg total) by mouth daily.   buPROPion (WELLBUTRIN XL) 150 MG 24 hr tablet Take 150 mg by mouth every morning.   clopidogrel  (PLAVIX ) 75 MG tablet Take 1 tablet (75 mg total) by mouth daily.   cycloSPORINE (RESTASIS) 0.05 % ophthalmic emulsion 1 drop  2 (two) times daily.   diclofenac  Sodium (VOLTAREN ) 1 % GEL Apply 2 g topically 2 (two) times daily.   escitalopram  (LEXAPRO ) 10 MG tablet Take 10 mg by mouth daily.   Eszopiclone  3 MG TABS Take 1 tablet (3 mg total) by mouth at bedtime. Take immediately before bedtime   metoprolol  tartrate (LOPRESSOR ) 25 MG tablet Take 12.5 mg by mouth 2 (two) times daily.   NON FORMULARY Diet: Regular, Chopped/Dysphagia 3   Special Instructions: Aspiration precautions. May have bacon per SLP.   pantoprazole  (PROTONIX ) 20 MG tablet Take 20 mg by mouth daily.   polyethylene glycol (MIRALAX  / GLYCOLAX ) 17 g packet Take 17 g by mouth as needed. Mon, wed, fri   senna (SENOKOT) 8.6 MG TABS tablet Take 2 tablets by mouth. Give 2  tablet by mouth in the evening every other day for constipation   traZODone (DESYREL) 50 MG tablet Take 50 mg by mouth at bedtime. 25 mg, oral, At bedtime, Trazodone 25 mg po at bedtime for insomnia   No facility-administered encounter medications on file as of 05/14/2024.    Review of Systems  Constitutional:  Negative for activity change, appetite change, chills, diaphoresis, fatigue, fever and unexpected weight change.  Musculoskeletal:  Positive for gait problem. Negative for arthralgias, back pain and joint swelling.       Right hand contracture  Neurological:  Positive for weakness.    Immunization History  Administered Date(s) Administered   Fluad Quad(high Dose 65+) 04/27/2019, 04/27/2022, 05/14/2023   INFLUENZA, HIGH DOSE SEASONAL PF 05/03/2017, 05/19/2018   Influenza,inj,Quad PF,6+ Mos 04/29/2013, 05/18/2014, 05/02/2015, 04/17/2016   Influenza-Unspecified 05/13/2020, 04/26/2021, 04/21/2024   Moderna Covid-19 Vaccine Bivalent Booster 70yrs & up 11/15/2022, 05/14/2023   Moderna SARS-COV2 Booster Vaccination 12/25/2020   Moderna Sars-Covid-2 Vaccination 05/28/2022   PFIZER(Purple Top)SARS-COV-2 Vaccination 08/27/2019, 09/21/2019, 06/13/2020   Pfizer Covid-19 Vaccine Bivalent Booster 5y-11y 05/03/2021   Pneumococcal Conjugate-13 05/11/2013   Pneumococcal Polysaccharide-23 05/02/2015   Tdap 01/23/2021   Unspecified SARS-COV-2 Vaccination 04/21/2024   Zoster Recombinant(Shingrix) 01/20/2021, 03/24/2021   Zoster, Unspecified 01/20/2021   Pertinent  Health Maintenance Due  Topic Date Due   Influenza Vaccine  Completed   Mammogram  Discontinued   DEXA SCAN  Discontinued   Colonoscopy  Discontinued      11/12/2022    2:58 PM 05/29/2023    2:56 PM 07/29/2023    2:17 PM 09/25/2023    3:13 PM 12/02/2023    1:37 PM  Fall Risk  Falls in the past year? 0 0 0 0 0  Was there an injury with Fall? 0 0 0 0 0  Fall Risk Category Calculator 0 0 0 0 0  Patient at Risk for Falls Due to  History of fall(s);Impaired balance/gait;Impaired mobility No Fall Risks   Impaired balance/gait;Impaired mobility  Fall risk Follow up Falls evaluation completed Falls evaluation completed  Falls evaluation completed Falls evaluation completed   Functional Status Survey:    There were no vitals filed for this visit. There is no height or weight on file to calculate BMI. Physical Exam Constitutional:      Appearance: Normal appearance.  Musculoskeletal:     Comments: +CMS to right upper extremity Contracture with flexion of 3rd and 4th digit of right hand No redness or swelling.    Neurological:     Mental Status: She is alert. Mental status is at baseline.     Labs reviewed: Recent Labs    07/30/23 0000 12/22/23 1125  NA 137  139  K 4.6 3.8  CL 103 103  CO2 26* 25  GLUCOSE  --  91  BUN 16 18  CREATININE 0.8 0.86  CALCIUM  9.7 10.0   Recent Labs    07/30/23 0000  AST 22  ALT 34  ALKPHOS 74  ALBUMIN 3.8   Recent Labs    07/30/23 0000 12/22/23 1125  WBC 6.8 10.7*  NEUTROABS  --  8.7*  HGB 13.3 13.0  HCT 39 40.3  MCV  --  102.5*  PLT 258 246   Lab Results  Component Value Date   TSH 1.445 10/17/2022   Lab Results  Component Value Date   HGBA1C 5.1 11/11/2019   Lab Results  Component Value Date   CHOL 148 11/28/2023   HDL 64 11/28/2023   LDLCALC 62 11/28/2023   LDLDIRECT 116.0 04/27/2019   TRIG 108 11/28/2023   CHOLHDL 3.0 11/11/2019    Significant Diagnostic Results in last 30 days:  No results found.  Assessment/Plan  1. Contracture of joint of finger of right hand (Primary)  [facility] Arlinda [facility]   Left message with Landry, her sister Discussed also with patient She is thinking about it

## 2024-06-08 ENCOUNTER — Ambulatory Visit: Admitting: Orthopedic Surgery

## 2024-06-11 ENCOUNTER — Telehealth: Payer: Self-pay | Admitting: Orthopedic Surgery

## 2024-06-11 ENCOUNTER — Telehealth: Payer: Self-pay

## 2024-06-11 NOTE — Telephone Encounter (Signed)
 LMOM for pt to CB to r/s for earlier in the day on 12/8

## 2024-06-11 NOTE — Telephone Encounter (Signed)
 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 She said she could call back in a few days, but she needs afternoon appts due to her schedule. Call back number is [phone]

## 2024-06-12 ENCOUNTER — Telehealth: Payer: Self-pay

## 2024-06-12 ENCOUNTER — Telehealth: Payer: Self-pay | Admitting: Adult Health

## 2024-06-12 MED ORDER — HYDROXYZINE PAMOATE 25 MG PO CAPS: 25.0000 mg | ORAL_CAPSULE | Freq: Every day | ORAL | Status: DC | PRN

## 2024-06-12 NOTE — Telephone Encounter (Signed)
 Nursing staff at [facility] are reporting issues with personal hygiene and resistance to care and assistance with hygiene. She seems more demanding and anxious at times. She is already taking lexparo and wellbutrin. Can add prn vistaril  for severe episodes or to assist with personal care.

## 2024-06-12 NOTE — Telephone Encounter (Unsigned)
 Copied from CRM [id number]. Topic: Clinical - Medication Prior Auth >> Jun 12, 2024 12:54 PM Cherylann RAMAN wrote: Reason for CRM: Melia with [employer] called to inform the PA was approved for hydrOXYine (VISTARIL ) 25 MG capsule. It is good for one year starting from 06/12/24

## 2024-06-29 ENCOUNTER — Ambulatory Visit: Admitting: Orthopedic Surgery

## 2024-06-29 ENCOUNTER — Other Ambulatory Visit: Payer: Self-pay | Admitting: Adult Health

## 2024-06-29 MED ORDER — ESZOPICLONE 3 MG PO TABS: 3.0000 mg | ORAL_TABLET | Freq: Every day | ORAL | 3 refills | Status: AC

## 2024-07-06 ENCOUNTER — Ambulatory Visit: Admitting: Orthopedic Surgery

## 2024-07-20 ENCOUNTER — Ambulatory Visit: Admitting: Orthopedic Surgery

## 2024-07-20 DIAGNOSIS — M720 Palmar fascial fibromatosis [Dupuytren]: Secondary | ICD-10-CM

## 2024-07-20 NOTE — Progress Notes (Signed)
 "  Lynn [name] - 78 y.o. female MRN [id number]  Date of birth: 12-28-1945  Office Visit Note: Visit Date: 07/20/2024 PCP: Charlanne Fredia CROME, MD Referred by: Charlanne Fredia CROME, MD  Subjective: No chief complaint on file.  HPI: Lynn [name] is a pleasant 78 y.o. female who presents today for evaluation of right hand contractures to the long and ring finger PIP joints that have been present for multiple years, worsening in nature.  There is notable history of Dupuytren's disease in the family.  Of note, she does have a prior stroke with primarily left-sided deficits.  Has not undergone any prior workup or treatment for the Dupuytren disease.  She presents today with her sister who is her power of attorney and helps with medical decision making.  Pertinent ROS were reviewed with the patient and found to be negative unless otherwise specified above in HPI.   Visit Reason: right hand  Duration of symptoms: years Hand dominance: right Occupation: retired Diabetic: No Smoking: No Heart/Lung History: none Blood Thinners: none  Prior Testing/EMG: none Injections (Date): none Treatments: OT, brace Prior Surgery: none    Assessment & Plan: Visit Diagnoses:  1. Dupuytren's contracture of hand     Plan: Extensive discussion was had with the patient and her sister today regarding her right hand Dupuytren's disease.  We discussed the underlying etiology and pathophysiology of this condition as well as all treatment modalities ranging from conservative to surgical.  From a conservative standpoint, we discussed ongoing observation, splinting, therapy and activity modification.  We also discussed the potential for collagenase injection as well as the appropriate protocol and timeframe for recovery.  From a surgical standpoint, we discussed fasciectomy as well as all risks and benefits associated.  Understanding the options, patient would like to move forward with Xiaflex injection to  the right hand long and ring finger PIP contractures which is appropriate.  Risks and benefits of the Xiaflex injection were discussed in detail as well as postprocedural splinting and occupational therapy.  We discussed the potential for skin tearing in particular as well.  Understanding the risk and benefits, patient would like to proceed.  Will contact them when the injection is ready to move forward with scheduling of both injection and subsequent manipulation.   Follow-up: No follow-ups on file.   Meds & Orders: No orders of the defined types were placed in this encounter.  No orders of the defined types were placed in this encounter.    Procedures: No procedures performed      Clinical History: No specialty comments available.  She reports that she quit smoking about 48 years ago. Her smoking use included cigarettes. She started smoking about 58 years ago. She has never used smokeless tobacco. No results for input(s): HGBA1C, LABURIC in the last 8760 hours.  Objective:   Vital Signs: LMP 07/23/1998   Physical Exam  Gen: Well-appearing, in no acute distress; non-toxic CV: Regular Rate. Well-perfused. Warm.  Resp: Breathing unlabored on room air; no wheezing. Psych: Fluid speech in conversation; appropriate affect; normal thought process  Ortho Exam Right hand: - Palpable cords in line with the long and ring finger with notable contracture, positive tabletop test - Contracture to the right long finger PIP 80 degrees, not passively correctable - Contracture to the right ring finger PIP 80 degrees, not passively correctable - MPs without significant contracture   Imaging: No results found.  Past Medical/Family/Surgical/Social History: Medications & Allergies reviewed per EMR, new medications updated. 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) 11/27/2019   Dysphagia following cerebrovascular accident (CVA) 11/27/2019   Malnutrition of moderate degree 11/21/2019   Depression    CVA (cerebral vascular accident) (HCC) 11/10/2019   Senile purpura 04/27/2019   Prolapsed uterus 04/27/2019   Aortic atherosclerosis 06/26/2017   Neuropathy 05/20/2017   Abnormality of gait due to impairment of balance 05/20/2017   Incontinence in female 06/25/2014   Hearing deficit 06/25/2014   Osteoporosis 06/24/2013   Hypertension 05/11/2013   Osteoarthritis 05/11/2013   Vitamin D  deficiency 05/11/2013   History of colonic polyps 05/11/2013   Hyperlipidemia 05/11/2013   History of adenomatous polyp of colon 05/11/2013   MCI (mild cognitive impairment) with memory loss 03/17/2013   Cervical spine arthritis 09/26/2012   Raynaud's phenomenon 09/26/2012   Past Medical History:  Diagnosis Date   Anxiety    Aortic atherosclerosis    Arthritis    Complication of anesthesia    per pt, slow to wake up past sedation.   CVA (cerebral vascular accident) (HCC) 11/10/2019   Depression    Diverticulosis    Hiatal hernia    Hyperplastic colon polyp    Hypertension    Neuropathy    Osteoarthritis    Osteoporosis    Renal mass    Stomach upset    has a senstive stomach   Throat clearing    has a cough at times   Urinary incontinence    Vitamin D  deficiency    Family History  Problem Relation Age of Onset   Arthritis Mother    Colon cancer Father    COPD Father 24       died at 36   Throat cancer Brother    Breast cancer Other    Esophageal cancer Neg Hx    Rectal cancer Neg Hx    Stomach cancer Neg Hx    Stroke Neg Hx    Past Surgical History:  Procedure Laterality Date   COLONOSCOPY     FACIAL COSMETIC SURGERY     over 15 years ago   FINGER TENDON REPAIR     middle rt  hand-   HAMMER TOE SURGERY  12/27/2011   Procedure: HAMMER TOE CORRECTION;  Surgeon: Norleen Armor, MD;  Location: [facility];  Service: Orthopedics;  Laterality: Left;  2-4th   Social History   Occupational History   Occupation: retired  Tobacco Use   Smoking status: Former    Current packs/day: 0.00    Types: Cigarettes    Start date: 12/20/1965    Quit date: 12/21/1975    Years since quitting: 48.6   Smokeless tobacco: Never  Vaping Use   Vaping status: Never Used  Substance and Sexual Activity   Alcohol use: Not Currently    Comment: rarely   Drug use: No   Sexual activity: Not Currently    Elijah Phommachanh Estela) Arlinda, M.D. [facility], Hand Surgery  "

## 2024-07-29 ENCOUNTER — Encounter: Payer: Self-pay | Admitting: Internal Medicine

## 2024-08-03 ENCOUNTER — Encounter: Payer: Self-pay | Admitting: Internal Medicine

## 2024-08-06 LAB — LIPID PANEL
Cholesterol: 135 (ref 0–200)
HDL: 67 (ref 35–70)
LDL Cholesterol: 52
LDl/HDL Ratio: 2
Triglycerides: 78 (ref 40–160)

## 2024-08-06 LAB — CBC AND DIFFERENTIAL
HCT: 39 (ref 36–46)
Hemoglobin: 13 (ref 12.0–16.0)
Platelets: 274 K/uL (ref 150–400)
WBC: 7.4

## 2024-08-06 LAB — HEPATIC FUNCTION PANEL
ALT: 27 U/L (ref 7–35)
AST: 19 (ref 13–35)
Alkaline Phosphatase: 72 (ref 25–125)
Bilirubin, Total: 0.3

## 2024-08-06 LAB — COMPREHENSIVE METABOLIC PANEL WITH GFR
Albumin: 3.7 (ref 3.5–5.0)
Calcium: 9.6 (ref 8.7–10.7)
Globulin: 1.9
eGFR: 75

## 2024-08-06 LAB — BASIC METABOLIC PANEL WITH GFR
BUN: 18 (ref 4–21)
CO2: 25 — AB (ref 13–22)
Chloride: 102 (ref 99–108)
Creatinine: 0.8 (ref 0.5–1.1)
Glucose: 81
Potassium: 4.4 meq/L (ref 3.5–5.1)
Sodium: 136 — AB (ref 137–147)

## 2024-08-06 LAB — TSH: TSH: 0.73 (ref 0.41–5.90)

## 2024-08-06 LAB — CBC: RBC: 3.89 (ref 3.87–5.11)

## 2024-08-10 ENCOUNTER — Non-Acute Institutional Stay: Payer: Self-pay | Admitting: Adult Health

## 2024-08-10 ENCOUNTER — Encounter: Payer: Self-pay | Admitting: Adult Health

## 2024-08-10 VITALS — BP 120/80 | HR 58 | Temp 97.6°F | Ht 62.0 in | Wt 112.4 lb

## 2024-08-10 DIAGNOSIS — M720 Palmar fascial fibromatosis [Dupuytren]: Secondary | ICD-10-CM

## 2024-08-10 DIAGNOSIS — E7800 Pure hypercholesterolemia, unspecified: Secondary | ICD-10-CM | POA: Diagnosis not present

## 2024-08-10 DIAGNOSIS — F5101 Primary insomnia: Secondary | ICD-10-CM

## 2024-08-10 DIAGNOSIS — F418 Other specified anxiety disorders: Secondary | ICD-10-CM

## 2024-08-10 DIAGNOSIS — I10 Essential (primary) hypertension: Secondary | ICD-10-CM | POA: Diagnosis not present

## 2024-08-10 DIAGNOSIS — D500 Iron deficiency anemia secondary to blood loss (chronic): Secondary | ICD-10-CM | POA: Diagnosis not present

## 2024-08-10 NOTE — Progress Notes (Signed)
 "  Location:  [facility]  POS: Clinic  Provider: Tawni America, ANP  Code Status: DNR and most form.  Goals of Care:     01/28/2024   11:56 AM  Advanced Directives  Does Patient Have a Medical Advance Directive? Yes  Type of Estate Agent of [place];Out of facility DNR (pink MOST or yellow form)  Does patient want to make changes to medical advance directive? No - Patient declined  Copy of Healthcare Power of Attorney in Chart? Yes - validated most recent copy scanned in chart (See row information)     Chief Complaint  Patient presents with   Follow-up    3 month follow up Patient has concerns about not being able to sleep through the night.    HPI: Discussed the use of AI scribe software for clinical note transcription with the patient, who gave verbal consent to proceed.  History of Present Illness Lynn [name] is a 79 year old female who presents for a routine follow-up visit.   Mobility and activities of daily living - Resides in an enhanced assisted living facility due to needing more assistance with ADls - hx of CVA with left sided weakness (bilateral basal ganglia infarction in 2021 -can transfer to chair without a lift.  -uses a power chair  Right hand contracture - Right hand contracture present - She reports she is waiting to decide on treatment option. Injection recommended per note from hand surgeon  Mood disturbance and sleep disruption - Wellbutrin dose increased on August 07, 2024, due to issues with lack of motivation and mood.  - Intermittent anxiety - Difficulty sleeping with tossing and turning for the past two nights - Takes Trazodone at bedtime -on lexparo for mood.   Hypertension and cardiovascular risk management - Blood pressure in the 120s to 130s over 60s to 70s - Takes Lopressor  twice daily, Lipitor, and Plavix   Dysphagia - No dysphagia   Hx of hiatal hernia and gerd on protonix .  Hx of iron  deficiency anemia with prior iron infusion  Past Medical History:  Diagnosis Date   Anxiety    Aortic atherosclerosis    Arthritis    Complication of anesthesia    per pt, slow to wake up past sedation.   CVA (cerebral vascular accident) (HCC) 11/10/2019   Depression    Diverticulosis    Hiatal hernia    Hyperplastic colon polyp    Hypertension    Neuropathy    Osteoarthritis    Osteoporosis    Renal mass    Stomach upset    has a senstive stomach   Throat clearing    has a cough at times   Urinary incontinence    Vitamin D  deficiency     Past Surgical History:  Procedure Laterality Date   COLONOSCOPY     FACIAL COSMETIC SURGERY     over 15 years ago   FINGER TENDON REPAIR     middle rt hand-   HAMMER TOE SURGERY  12/27/2011   Procedure: HAMMER TOE CORRECTION;  Surgeon: Norleen Armor, MD;  Location: [facility];  Service: Orthopedics;  Laterality: Left;  2-4th    Allergies[1]  Outpatient Encounter Medications as of 08/10/2024  Medication Sig   acetaminophen  (TYLENOL ) 500 MG tablet Take 1,000 mg by mouth in the morning and at bedtime.   atorvastatin  (LIPITOR) 40 MG tablet Take 1 tablet (40 mg total) by mouth daily.   buPROPion (WELLBUTRIN XL) 300 MG 24 hr  tablet Take 300 mg by mouth every morning.   clopidogrel  (PLAVIX ) 75 MG tablet Take 1 tablet (75 mg total) by mouth daily.   cycloSPORINE (RESTASIS) 0.05 % ophthalmic emulsion 1 drop 2 (two) times daily.   diclofenac  Sodium (VOLTAREN ) 1 % GEL Apply 2 g topically 2 (two) times daily.   escitalopram  (LEXAPRO ) 10 MG tablet Take 10 mg by mouth daily.   Eszopiclone  3 MG TABS Take 1 tablet (3 mg total) by mouth at bedtime. Take immediately before bedtime   metoprolol  tartrate (LOPRESSOR ) 25 MG tablet Take 12.5 mg by mouth 2 (two) times daily.   NON FORMULARY Diet: Regular, Chopped/Dysphagia 3   Special Instructions: Aspiration precautions. May have bacon per SLP.   pantoprazole  (PROTONIX ) 20 MG tablet Take 20  mg by mouth daily.   polyethylene glycol (MIRALAX  / GLYCOLAX ) 17 g packet Take 17 g by mouth as needed. Mon, wed, fri   traZODone (DESYREL) 50 MG tablet Take 50 mg by mouth at bedtime. 25 mg, oral, At bedtime, Trazodone 25 mg po at bedtime for insomnia   [DISCONTINUED] hydrOXYzine  (VISTARIL ) 25 MG capsule Take 1 capsule (25 mg total) by mouth daily as needed. (Patient not taking: Reported on 08/06/2024)   [DISCONTINUED] senna (SENOKOT) 8.6 MG TABS tablet Take 2 tablets by mouth. Give 2 tablet by mouth in the evening every other day for constipation (Patient not taking: Reported on 08/06/2024)   No facility-administered encounter medications on file as of 08/10/2024.    Review of Systems:  Review of Systems  Constitutional:  Negative for activity change, appetite change, chills, diaphoresis, fatigue, fever and unexpected weight change.  HENT:  Negative for congestion.   Eyes:  Negative for visual disturbance.  Respiratory:  Negative for cough, shortness of breath and wheezing.   Cardiovascular:  Negative for chest pain, palpitations and leg swelling.  Gastrointestinal:  Negative for abdominal distention, constipation, diarrhea and nausea.  Endocrine: Negative for polyuria.  Genitourinary:  Negative for difficulty urinating.  Musculoskeletal:  Positive for gait problem. Negative for back pain.       Right hand contracture  Neurological:  Positive for weakness. Negative for dizziness.  Psychiatric/Behavioral:  Positive for sleep disturbance. Negative for behavioral problems, confusion, self-injury and suicidal ideas. The patient is nervous/anxious.     Health Maintenance  Topic Date Due   COVID-19 Vaccine (10 - 2025-26 season) 10/19/2024   Medicare Annual Wellness (AWV)  12/01/2024   DTaP/Tdap/Td (2 - Td or Tdap) 01/24/2031   Pneumococcal Vaccine: 50+ Years  Completed   Influenza Vaccine  Completed   Hepatitis C Screening  Completed   Zoster Vaccines- Shingrix  Completed   Meningococcal B  Vaccine  Aged Out   Mammogram  Discontinued   Bone Density Scan  Discontinued   Colonoscopy  Discontinued    Physical Exam: Vitals:   08/10/24 1432  BP: 120/80  Pulse: (!) 58  Temp: 97.6 F (36.4 C)  SpO2: 98%  Weight: 112 lb 6.4 oz (51 kg)  Height: 5' 2 (1.575 m)   Body mass index is 20.56 kg/m. Physical Exam Vitals and nursing note reviewed.  Constitutional:      General: She is not in acute distress.    Appearance: Normal appearance. She is not diaphoretic.  HENT:     Head: Normocephalic and atraumatic.     Right Ear: Tympanic membrane and ear canal normal.     Left Ear: Tympanic membrane normal.     Nose: No congestion.  Mouth/Throat:     Mouth: Mucous membranes are moist.     Pharynx: Oropharynx is clear. No oropharyngeal exudate or posterior oropharyngeal erythema.  Eyes:     Conjunctiva/sclera: Conjunctivae normal.     Pupils: Pupils are equal, round, and reactive to light.  Neck:     Thyroid : No thyromegaly.     Vascular: No carotid bruit or JVD.  Cardiovascular:     Rate and Rhythm: Normal rate and regular rhythm.     Heart sounds: Normal heart sounds. No murmur heard. Pulmonary:     Effort: Pulmonary effort is normal. No respiratory distress.     Breath sounds: Normal breath sounds. No stridor.  Abdominal:     General: Bowel sounds are normal. There is no distension.     Palpations: Abdomen is soft.     Tenderness: There is no abdominal tenderness. There is no right CVA tenderness or left CVA tenderness.  Musculoskeletal:     Cervical back: No rigidity. No muscular tenderness.     Right lower leg: No edema.     Left lower leg: No edema.     Comments: Right hand contracture.   Lymphadenopathy:     Cervical: No cervical adenopathy.  Skin:    General: Skin is warm and dry.  Neurological:     Mental Status: She is alert and oriented to person, place, and time. Mental status is at baseline.     Comments: Left sided weakness from prior CVA   Psychiatric:        Mood and Affect: Mood normal.     Labs reviewed: Basic Metabolic Panel: Recent Labs    12/22/23 1125 08/06/24 0000  NA 139 136*  K 3.8 4.4  CL 103 102  CO2 25 25*  GLUCOSE 91  --   BUN 18 18  CREATININE 0.86 0.8  CALCIUM  10.0 9.6  TSH  --  0.73   Liver Function Tests: Recent Labs    08/06/24 0000  AST 19  ALT 27  ALKPHOS 72  ALBUMIN 3.7   No results for input(s): LIPASE, AMYLASE in the last 8760 hours. No results for input(s): AMMONIA in the last 8760 hours. CBC: Recent Labs    12/22/23 1125 08/06/24 0000  WBC 10.7* 7.4  NEUTROABS 8.7*  --   HGB 13.0 13.0  HCT 40.3 39  MCV 102.5*  --   PLT 246 274   Lipid Panel: Recent Labs    11/28/23 0000 08/06/24 0000  CHOL 148 135  HDL 64 67  LDLCALC 62 52  TRIG 108 78   Lab Results  Component Value Date   HGBA1C 5.1 11/11/2019    Procedures since last visit: No results found.  Assessment/Plan Assessment and Plan Assessment & Plan MDD with anxiety and lack of motivation.  Recent increase in Wellbutrin dosage needs more time to work.  - Continue Wellbutrin at increased dosage. - Monitor mood and motivation for changes.  Insomnia Intermittent sleep disturbances Currently taking trazodone at bedtime for sleep. - Continue trazodone at bedtime for sleep.  Dupuytren contracture.  Contracture in the right hand, which is the weaker hand. Awaiting further guidance from hand specialist regarding potential treatment options such as injections. - Await further guidance from hand specialist regarding treatment options.  Hypertension and history of cerebral infarction Blood pressure well-controlled with current regimen of Lopressor , Lipitor, and Plavix . Recent readings are within target range (120s-130s/60s-80s). - Continue current antihypertensive regimen with Lopressor . - Continue Lipitor and Plavix  for stroke prevention.  Hyperlipidemia Lipid panel shows excellent control with  LDL at 52 and total cholesterol at 135. - Continue current lipid-lowering therapy.  IDA Normal CBC      Labs/tests ordered:  * No order type specified *NA Next appt:  3months with Dr Charlanne    Total time :  time greater than 50% of total time spent doing pt counseling and coordination of care         [1]  Allergies Allergen Reactions   Chlorhexidine     "

## 2024-11-10 ENCOUNTER — Encounter: Admitting: Internal Medicine
